# Patient Record
Sex: Female | Born: 1970
Health system: Southern US, Community
[De-identification: ages and names within clinical notes are randomized; demographics above are authoritative.]

## PROBLEM LIST (undated history)

## (undated) DIAGNOSIS — F32A Depression, unspecified: Secondary | ICD-10-CM

## (undated) DIAGNOSIS — F329 Major depressive disorder, single episode, unspecified: Secondary | ICD-10-CM

## (undated) DIAGNOSIS — J329 Chronic sinusitis, unspecified: Secondary | ICD-10-CM

## (undated) DIAGNOSIS — I10 Essential (primary) hypertension: Secondary | ICD-10-CM

## (undated) DIAGNOSIS — K760 Fatty (change of) liver, not elsewhere classified: Secondary | ICD-10-CM

## (undated) HISTORY — DX: Fatty (change of) liver, not elsewhere classified: K76.0

## (undated) HISTORY — DX: Major depressive disorder, single episode, unspecified: F32.9

## (undated) HISTORY — DX: Depression, unspecified: F32.A

## (undated) HISTORY — DX: Essential (primary) hypertension: I10

---

## 1997-01-05 DIAGNOSIS — E1165 Type 2 diabetes mellitus with hyperglycemia: Secondary | ICD-10-CM

## 1997-01-05 DIAGNOSIS — E119 Type 2 diabetes mellitus without complications: Secondary | ICD-10-CM | POA: Insufficient documentation

## 1998-05-27 ENCOUNTER — Encounter: Admission: RE | Admit: 1998-05-27 | Discharge: 1998-05-27 | Payer: Self-pay | Admitting: Internal Medicine

## 1998-06-10 ENCOUNTER — Encounter: Admission: RE | Admit: 1998-06-10 | Discharge: 1998-09-08 | Payer: Self-pay | Admitting: *Deleted

## 1999-04-14 ENCOUNTER — Encounter: Admission: RE | Admit: 1999-04-14 | Discharge: 1999-04-14 | Payer: Self-pay | Admitting: Internal Medicine

## 1999-07-08 ENCOUNTER — Encounter: Admission: RE | Admit: 1999-07-08 | Discharge: 1999-07-08 | Payer: Self-pay | Admitting: Internal Medicine

## 1999-07-18 ENCOUNTER — Encounter: Admission: RE | Admit: 1999-07-18 | Discharge: 1999-10-16 | Payer: Self-pay | Admitting: Internal Medicine

## 1999-08-14 ENCOUNTER — Encounter: Admission: RE | Admit: 1999-08-14 | Discharge: 1999-08-14 | Payer: Self-pay | Admitting: Hematology and Oncology

## 1999-09-11 ENCOUNTER — Encounter: Admission: RE | Admit: 1999-09-11 | Discharge: 1999-09-11 | Payer: Self-pay | Admitting: Hematology and Oncology

## 1999-11-11 ENCOUNTER — Encounter: Admission: RE | Admit: 1999-11-11 | Discharge: 1999-11-11 | Payer: Self-pay | Admitting: Hematology and Oncology

## 1999-12-12 ENCOUNTER — Encounter: Admission: RE | Admit: 1999-12-12 | Discharge: 2000-03-11 | Payer: Self-pay | Admitting: Internal Medicine

## 1999-12-16 ENCOUNTER — Encounter: Admission: RE | Admit: 1999-12-16 | Discharge: 1999-12-16 | Payer: Self-pay | Admitting: Obstetrics & Gynecology

## 1999-12-25 ENCOUNTER — Ambulatory Visit (HOSPITAL_COMMUNITY): Admission: RE | Admit: 1999-12-25 | Discharge: 1999-12-25 | Payer: Self-pay | Admitting: Obstetrics & Gynecology

## 2000-02-12 ENCOUNTER — Encounter: Admission: RE | Admit: 2000-02-12 | Discharge: 2000-02-12 | Payer: Self-pay | Admitting: Hematology and Oncology

## 2000-03-12 ENCOUNTER — Encounter: Admission: RE | Admit: 2000-03-12 | Discharge: 2000-06-10 | Payer: Self-pay | Admitting: Internal Medicine

## 2000-03-23 ENCOUNTER — Encounter: Admission: RE | Admit: 2000-03-23 | Discharge: 2000-03-23 | Payer: Self-pay | Admitting: Obstetrics & Gynecology

## 2000-04-06 ENCOUNTER — Encounter: Admission: RE | Admit: 2000-04-06 | Discharge: 2000-04-06 | Payer: Self-pay | Admitting: Hematology and Oncology

## 2000-04-08 ENCOUNTER — Emergency Department (HOSPITAL_COMMUNITY): Admission: EM | Admit: 2000-04-08 | Discharge: 2000-04-08 | Payer: Self-pay | Admitting: Emergency Medicine

## 2000-04-09 ENCOUNTER — Emergency Department (HOSPITAL_COMMUNITY): Admission: EM | Admit: 2000-04-09 | Discharge: 2000-04-09 | Payer: Self-pay | Admitting: Emergency Medicine

## 2000-07-27 ENCOUNTER — Emergency Department (HOSPITAL_COMMUNITY): Admission: EM | Admit: 2000-07-27 | Discharge: 2000-07-27 | Payer: Self-pay | Admitting: Emergency Medicine

## 2000-07-29 ENCOUNTER — Encounter: Admission: RE | Admit: 2000-07-29 | Discharge: 2000-07-29 | Payer: Self-pay | Admitting: Internal Medicine

## 2000-08-04 ENCOUNTER — Inpatient Hospital Stay (HOSPITAL_COMMUNITY): Admission: AD | Admit: 2000-08-04 | Discharge: 2000-08-06 | Payer: Self-pay | Admitting: Obstetrics

## 2000-08-05 ENCOUNTER — Encounter: Payer: Self-pay | Admitting: Obstetrics

## 2000-08-11 ENCOUNTER — Encounter: Admission: RE | Admit: 2000-08-11 | Discharge: 2000-08-11 | Payer: Self-pay | Admitting: Obstetrics & Gynecology

## 2000-08-11 ENCOUNTER — Inpatient Hospital Stay (HOSPITAL_COMMUNITY): Admission: AD | Admit: 2000-08-11 | Discharge: 2000-08-14 | Payer: Self-pay | Admitting: Obstetrics

## 2000-08-18 ENCOUNTER — Encounter: Admission: RE | Admit: 2000-08-18 | Discharge: 2000-08-18 | Payer: Self-pay | Admitting: Obstetrics & Gynecology

## 2000-08-18 ENCOUNTER — Encounter: Admission: RE | Admit: 2000-08-18 | Discharge: 2000-11-16 | Payer: Self-pay | Admitting: Obstetrics & Gynecology

## 2000-08-25 ENCOUNTER — Encounter: Admission: RE | Admit: 2000-08-25 | Discharge: 2000-08-25 | Payer: Self-pay | Admitting: Obstetrics & Gynecology

## 2000-08-25 ENCOUNTER — Inpatient Hospital Stay (HOSPITAL_COMMUNITY): Admission: AD | Admit: 2000-08-25 | Discharge: 2000-08-30 | Payer: Self-pay | Admitting: Obstetrics

## 2000-08-27 ENCOUNTER — Encounter: Payer: Self-pay | Admitting: Obstetrics

## 2000-09-01 ENCOUNTER — Encounter: Admission: RE | Admit: 2000-09-01 | Discharge: 2000-09-01 | Payer: Self-pay | Admitting: Obstetrics & Gynecology

## 2000-09-08 ENCOUNTER — Encounter: Admission: RE | Admit: 2000-09-08 | Discharge: 2000-09-08 | Payer: Self-pay | Admitting: Obstetrics & Gynecology

## 2000-09-13 ENCOUNTER — Encounter: Admission: RE | Admit: 2000-09-13 | Discharge: 2000-09-13 | Payer: Self-pay | Admitting: Internal Medicine

## 2000-09-22 ENCOUNTER — Encounter: Admission: RE | Admit: 2000-09-22 | Discharge: 2000-09-22 | Payer: Self-pay | Admitting: Obstetrics & Gynecology

## 2000-09-29 ENCOUNTER — Encounter: Admission: RE | Admit: 2000-09-29 | Discharge: 2000-09-29 | Payer: Self-pay | Admitting: Obstetrics & Gynecology

## 2000-10-06 ENCOUNTER — Encounter: Admission: RE | Admit: 2000-10-06 | Discharge: 2000-10-06 | Payer: Self-pay | Admitting: Obstetrics & Gynecology

## 2000-10-12 ENCOUNTER — Ambulatory Visit (HOSPITAL_COMMUNITY): Admission: RE | Admit: 2000-10-12 | Discharge: 2000-10-12 | Payer: Self-pay

## 2000-10-20 ENCOUNTER — Encounter: Admission: RE | Admit: 2000-10-20 | Discharge: 2000-10-20 | Payer: Self-pay | Admitting: Obstetrics & Gynecology

## 2000-11-03 ENCOUNTER — Encounter: Admission: RE | Admit: 2000-11-03 | Discharge: 2000-11-03 | Payer: Self-pay | Admitting: Obstetrics & Gynecology

## 2000-11-16 ENCOUNTER — Ambulatory Visit (HOSPITAL_COMMUNITY): Admission: RE | Admit: 2000-11-16 | Discharge: 2000-11-16 | Payer: Self-pay | Admitting: Obstetrics

## 2000-11-24 ENCOUNTER — Encounter: Admission: RE | Admit: 2000-11-24 | Discharge: 2000-11-24 | Payer: Self-pay | Admitting: Obstetrics & Gynecology

## 2000-12-25 ENCOUNTER — Inpatient Hospital Stay (HOSPITAL_COMMUNITY): Admission: AD | Admit: 2000-12-25 | Discharge: 2000-12-25 | Payer: Self-pay | Admitting: *Deleted

## 2001-01-07 ENCOUNTER — Ambulatory Visit (HOSPITAL_COMMUNITY): Admission: RE | Admit: 2001-01-07 | Discharge: 2001-01-07 | Payer: Self-pay | Admitting: Obstetrics & Gynecology

## 2001-01-12 ENCOUNTER — Encounter: Payer: Self-pay | Admitting: *Deleted

## 2001-01-12 ENCOUNTER — Observation Stay (HOSPITAL_COMMUNITY): Admission: AD | Admit: 2001-01-12 | Discharge: 2001-01-12 | Payer: Self-pay | Admitting: *Deleted

## 2001-01-16 ENCOUNTER — Inpatient Hospital Stay (HOSPITAL_COMMUNITY): Admission: AD | Admit: 2001-01-16 | Discharge: 2001-01-16 | Payer: Self-pay | Admitting: Obstetrics & Gynecology

## 2001-01-19 ENCOUNTER — Encounter: Admission: RE | Admit: 2001-01-19 | Discharge: 2001-02-16 | Payer: Self-pay | Admitting: Obstetrics

## 2001-01-19 ENCOUNTER — Encounter: Admission: RE | Admit: 2001-01-19 | Discharge: 2001-01-19 | Payer: Self-pay | Admitting: Obstetrics & Gynecology

## 2001-01-26 ENCOUNTER — Encounter: Admission: RE | Admit: 2001-01-26 | Discharge: 2001-01-26 | Payer: Self-pay | Admitting: Obstetrics & Gynecology

## 2001-01-31 ENCOUNTER — Inpatient Hospital Stay (HOSPITAL_COMMUNITY): Admission: AD | Admit: 2001-01-31 | Discharge: 2001-02-03 | Payer: Self-pay | Admitting: Obstetrics

## 2001-02-09 ENCOUNTER — Encounter: Admission: RE | Admit: 2001-02-09 | Discharge: 2001-02-09 | Payer: Self-pay | Admitting: *Deleted

## 2001-02-09 ENCOUNTER — Inpatient Hospital Stay (HOSPITAL_COMMUNITY): Admission: AD | Admit: 2001-02-09 | Discharge: 2001-02-11 | Payer: Self-pay | Admitting: *Deleted

## 2001-02-11 ENCOUNTER — Encounter: Payer: Self-pay | Admitting: *Deleted

## 2001-02-15 ENCOUNTER — Inpatient Hospital Stay (HOSPITAL_COMMUNITY): Admission: AD | Admit: 2001-02-15 | Discharge: 2001-02-19 | Payer: Self-pay | Admitting: *Deleted

## 2001-02-15 ENCOUNTER — Encounter (INDEPENDENT_AMBULATORY_CARE_PROVIDER_SITE_OTHER): Payer: Self-pay | Admitting: Specialist

## 2001-03-08 ENCOUNTER — Encounter: Admission: RE | Admit: 2001-03-08 | Discharge: 2001-03-08 | Payer: Self-pay | Admitting: Internal Medicine

## 2001-04-11 ENCOUNTER — Encounter: Admission: RE | Admit: 2001-04-11 | Discharge: 2001-04-11 | Payer: Self-pay | Admitting: Internal Medicine

## 2001-05-23 ENCOUNTER — Encounter: Admission: RE | Admit: 2001-05-23 | Discharge: 2001-05-23 | Payer: Self-pay | Admitting: Internal Medicine

## 2003-07-10 ENCOUNTER — Emergency Department (HOSPITAL_COMMUNITY): Admission: EM | Admit: 2003-07-10 | Discharge: 2003-07-10 | Payer: Self-pay | Admitting: Family Medicine

## 2003-08-29 ENCOUNTER — Encounter: Admission: RE | Admit: 2003-08-29 | Discharge: 2003-08-29 | Payer: Self-pay | Admitting: Internal Medicine

## 2003-09-07 ENCOUNTER — Emergency Department (HOSPITAL_COMMUNITY): Admission: EM | Admit: 2003-09-07 | Discharge: 2003-09-07 | Payer: Self-pay | Admitting: Emergency Medicine

## 2003-09-11 ENCOUNTER — Ambulatory Visit: Payer: Self-pay | Admitting: Family Medicine

## 2003-09-11 ENCOUNTER — Inpatient Hospital Stay (HOSPITAL_COMMUNITY): Admission: AD | Admit: 2003-09-11 | Discharge: 2003-09-17 | Payer: Self-pay | Admitting: *Deleted

## 2003-09-20 ENCOUNTER — Ambulatory Visit: Payer: Self-pay | Admitting: Family Medicine

## 2003-10-04 ENCOUNTER — Ambulatory Visit: Payer: Self-pay | Admitting: Family Medicine

## 2003-10-10 ENCOUNTER — Inpatient Hospital Stay (HOSPITAL_COMMUNITY): Admission: AD | Admit: 2003-10-10 | Discharge: 2003-10-10 | Payer: Self-pay | Admitting: Obstetrics & Gynecology

## 2003-10-11 ENCOUNTER — Ambulatory Visit: Payer: Self-pay | Admitting: Family Medicine

## 2003-10-13 ENCOUNTER — Inpatient Hospital Stay (HOSPITAL_COMMUNITY): Admission: AD | Admit: 2003-10-13 | Discharge: 2003-10-14 | Payer: Self-pay | Admitting: *Deleted

## 2003-10-25 ENCOUNTER — Ambulatory Visit: Payer: Self-pay | Admitting: Family Medicine

## 2003-11-01 ENCOUNTER — Ambulatory Visit: Payer: Self-pay | Admitting: *Deleted

## 2003-11-15 ENCOUNTER — Ambulatory Visit: Payer: Self-pay | Admitting: Family Medicine

## 2003-11-28 ENCOUNTER — Ambulatory Visit: Payer: Self-pay | Admitting: *Deleted

## 2003-12-05 ENCOUNTER — Ambulatory Visit: Payer: Self-pay | Admitting: *Deleted

## 2003-12-06 ENCOUNTER — Emergency Department (HOSPITAL_COMMUNITY): Admission: EM | Admit: 2003-12-06 | Discharge: 2003-12-06 | Payer: Self-pay | Admitting: Family Medicine

## 2003-12-11 ENCOUNTER — Ambulatory Visit (HOSPITAL_COMMUNITY): Admission: RE | Admit: 2003-12-11 | Discharge: 2003-12-11 | Payer: Self-pay | Admitting: *Deleted

## 2003-12-11 ENCOUNTER — Ambulatory Visit: Payer: Self-pay | Admitting: *Deleted

## 2003-12-12 ENCOUNTER — Ambulatory Visit: Payer: Self-pay | Admitting: *Deleted

## 2003-12-26 ENCOUNTER — Ambulatory Visit: Payer: Self-pay | Admitting: Obstetrics & Gynecology

## 2004-01-01 ENCOUNTER — Inpatient Hospital Stay (HOSPITAL_COMMUNITY): Admission: AD | Admit: 2004-01-01 | Discharge: 2004-01-01 | Payer: Self-pay | Admitting: Family Medicine

## 2004-01-02 ENCOUNTER — Ambulatory Visit: Payer: Self-pay | Admitting: Family Medicine

## 2004-01-09 ENCOUNTER — Ambulatory Visit (HOSPITAL_COMMUNITY): Admission: RE | Admit: 2004-01-09 | Discharge: 2004-01-09 | Payer: Self-pay | Admitting: *Deleted

## 2004-01-10 ENCOUNTER — Ambulatory Visit: Payer: Self-pay | Admitting: Family Medicine

## 2004-01-31 ENCOUNTER — Ambulatory Visit: Payer: Self-pay | Admitting: Family Medicine

## 2004-02-07 ENCOUNTER — Ambulatory Visit: Payer: Self-pay | Admitting: Family Medicine

## 2004-02-14 ENCOUNTER — Ambulatory Visit (HOSPITAL_COMMUNITY): Admission: RE | Admit: 2004-02-14 | Discharge: 2004-02-14 | Payer: Self-pay | Admitting: *Deleted

## 2004-02-14 ENCOUNTER — Ambulatory Visit: Payer: Self-pay | Admitting: Family Medicine

## 2004-02-28 ENCOUNTER — Ambulatory Visit: Payer: Self-pay | Admitting: Family Medicine

## 2004-03-06 ENCOUNTER — Ambulatory Visit: Payer: Self-pay | Admitting: Family Medicine

## 2004-03-10 ENCOUNTER — Inpatient Hospital Stay (HOSPITAL_COMMUNITY): Admission: AD | Admit: 2004-03-10 | Discharge: 2004-03-10 | Payer: Self-pay | Admitting: *Deleted

## 2004-03-13 ENCOUNTER — Ambulatory Visit: Payer: Self-pay | Admitting: Family Medicine

## 2004-03-17 ENCOUNTER — Ambulatory Visit: Payer: Self-pay | Admitting: *Deleted

## 2004-03-20 ENCOUNTER — Ambulatory Visit: Payer: Self-pay | Admitting: Family Medicine

## 2004-03-21 ENCOUNTER — Ambulatory Visit: Payer: Self-pay | Admitting: *Deleted

## 2004-03-27 ENCOUNTER — Ambulatory Visit: Payer: Self-pay | Admitting: Family Medicine

## 2004-03-27 ENCOUNTER — Ambulatory Visit (HOSPITAL_COMMUNITY): Admission: RE | Admit: 2004-03-27 | Discharge: 2004-03-27 | Payer: Self-pay | Admitting: Family Medicine

## 2004-03-31 ENCOUNTER — Ambulatory Visit (HOSPITAL_COMMUNITY): Admission: RE | Admit: 2004-03-31 | Discharge: 2004-03-31 | Payer: Self-pay | Admitting: Family Medicine

## 2004-03-31 ENCOUNTER — Ambulatory Visit: Payer: Self-pay | Admitting: Family Medicine

## 2004-04-03 ENCOUNTER — Ambulatory Visit (HOSPITAL_COMMUNITY): Admission: RE | Admit: 2004-04-03 | Discharge: 2004-04-03 | Payer: Self-pay | Admitting: Family Medicine

## 2004-04-03 ENCOUNTER — Ambulatory Visit: Payer: Self-pay | Admitting: Family Medicine

## 2004-04-04 ENCOUNTER — Ambulatory Visit: Payer: Self-pay | Admitting: Obstetrics & Gynecology

## 2004-04-07 ENCOUNTER — Ambulatory Visit: Payer: Self-pay | Admitting: *Deleted

## 2004-04-08 ENCOUNTER — Inpatient Hospital Stay (HOSPITAL_COMMUNITY): Admission: AD | Admit: 2004-04-08 | Discharge: 2004-04-11 | Payer: Self-pay | Admitting: *Deleted

## 2004-04-08 ENCOUNTER — Encounter (INDEPENDENT_AMBULATORY_CARE_PROVIDER_SITE_OTHER): Payer: Self-pay | Admitting: *Deleted

## 2004-04-09 ENCOUNTER — Ambulatory Visit: Payer: Self-pay | Admitting: Obstetrics and Gynecology

## 2004-04-22 ENCOUNTER — Inpatient Hospital Stay (HOSPITAL_COMMUNITY): Admission: AD | Admit: 2004-04-22 | Discharge: 2004-04-22 | Payer: Self-pay | Admitting: *Deleted

## 2004-05-02 ENCOUNTER — Ambulatory Visit: Payer: Self-pay | Admitting: Internal Medicine

## 2004-06-03 ENCOUNTER — Ambulatory Visit: Payer: Self-pay | Admitting: Internal Medicine

## 2004-06-18 ENCOUNTER — Ambulatory Visit: Payer: Self-pay | Admitting: Internal Medicine

## 2004-07-11 ENCOUNTER — Ambulatory Visit: Payer: Self-pay | Admitting: Internal Medicine

## 2004-07-17 ENCOUNTER — Ambulatory Visit (HOSPITAL_COMMUNITY): Admission: RE | Admit: 2004-07-17 | Discharge: 2004-07-17 | Payer: Self-pay | Admitting: Internal Medicine

## 2004-07-21 ENCOUNTER — Ambulatory Visit: Payer: Self-pay | Admitting: Internal Medicine

## 2004-07-29 ENCOUNTER — Ambulatory Visit: Payer: Self-pay | Admitting: Internal Medicine

## 2004-08-01 ENCOUNTER — Ambulatory Visit: Payer: Self-pay | Admitting: Internal Medicine

## 2004-09-16 ENCOUNTER — Ambulatory Visit: Payer: Self-pay | Admitting: Internal Medicine

## 2004-10-10 ENCOUNTER — Emergency Department (HOSPITAL_COMMUNITY): Admission: EM | Admit: 2004-10-10 | Discharge: 2004-10-10 | Payer: Self-pay | Admitting: Family Medicine

## 2005-02-09 ENCOUNTER — Emergency Department (HOSPITAL_COMMUNITY): Admission: EM | Admit: 2005-02-09 | Discharge: 2005-02-09 | Payer: Self-pay | Admitting: Family Medicine

## 2005-02-12 ENCOUNTER — Ambulatory Visit: Payer: Self-pay | Admitting: Internal Medicine

## 2005-04-14 ENCOUNTER — Ambulatory Visit: Payer: Self-pay | Admitting: Hospitalist

## 2005-05-28 ENCOUNTER — Emergency Department (HOSPITAL_COMMUNITY): Admission: EM | Admit: 2005-05-28 | Discharge: 2005-05-28 | Payer: Self-pay | Admitting: Family Medicine

## 2005-06-10 ENCOUNTER — Ambulatory Visit: Payer: Self-pay | Admitting: Internal Medicine

## 2005-06-15 ENCOUNTER — Ambulatory Visit: Payer: Self-pay | Admitting: Internal Medicine

## 2005-07-13 ENCOUNTER — Ambulatory Visit: Payer: Self-pay | Admitting: Hospitalist

## 2005-07-27 ENCOUNTER — Ambulatory Visit: Payer: Self-pay | Admitting: Hospitalist

## 2005-09-09 ENCOUNTER — Ambulatory Visit: Payer: Self-pay | Admitting: Internal Medicine

## 2005-11-02 ENCOUNTER — Encounter (INDEPENDENT_AMBULATORY_CARE_PROVIDER_SITE_OTHER): Payer: Self-pay | Admitting: Internal Medicine

## 2005-11-02 ENCOUNTER — Ambulatory Visit: Payer: Self-pay | Admitting: Internal Medicine

## 2005-11-02 LAB — CONVERTED CEMR LAB
Calcium: 9.1 mg/dL (ref 8.4–10.5)
Glucose, Bld: 172 mg/dL — ABNORMAL HIGH (ref 70–99)
Sodium: 138 meq/L (ref 135–145)

## 2005-12-10 ENCOUNTER — Ambulatory Visit: Payer: Self-pay | Admitting: Internal Medicine

## 2005-12-10 ENCOUNTER — Encounter (INDEPENDENT_AMBULATORY_CARE_PROVIDER_SITE_OTHER): Payer: Self-pay | Admitting: Internal Medicine

## 2005-12-23 DIAGNOSIS — I10 Essential (primary) hypertension: Secondary | ICD-10-CM

## 2005-12-31 ENCOUNTER — Emergency Department (HOSPITAL_COMMUNITY): Admission: EM | Admit: 2005-12-31 | Discharge: 2005-12-31 | Payer: Self-pay | Admitting: Family Medicine

## 2006-02-12 ENCOUNTER — Emergency Department (HOSPITAL_COMMUNITY): Admission: EM | Admit: 2006-02-12 | Discharge: 2006-02-12 | Payer: Self-pay | Admitting: Family Medicine

## 2006-03-04 ENCOUNTER — Telehealth: Payer: Self-pay | Admitting: *Deleted

## 2006-03-11 ENCOUNTER — Encounter (INDEPENDENT_AMBULATORY_CARE_PROVIDER_SITE_OTHER): Payer: Self-pay | Admitting: Internal Medicine

## 2006-03-11 ENCOUNTER — Ambulatory Visit: Payer: Self-pay | Admitting: Hospitalist

## 2006-03-11 LAB — CONVERTED CEMR LAB: Blood Glucose, Fingerstick: 343

## 2006-03-17 LAB — CONVERTED CEMR LAB
AST: 79 units/L — ABNORMAL HIGH (ref 0–37)
BUN: 9 mg/dL (ref 6–23)
Calcium: 9.2 mg/dL (ref 8.4–10.5)
Chloride: 102 meq/L (ref 96–112)
Creatinine, Ser: 0.71 mg/dL (ref 0.40–1.20)
Creatinine, Urine: 117.3 mg/dL

## 2006-03-24 ENCOUNTER — Emergency Department (HOSPITAL_COMMUNITY): Admission: EM | Admit: 2006-03-24 | Discharge: 2006-03-24 | Payer: Self-pay | Admitting: Family Medicine

## 2006-06-17 ENCOUNTER — Ambulatory Visit: Payer: Self-pay | Admitting: Internal Medicine

## 2006-06-17 ENCOUNTER — Encounter (INDEPENDENT_AMBULATORY_CARE_PROVIDER_SITE_OTHER): Payer: Self-pay | Admitting: Internal Medicine

## 2006-06-18 LAB — CONVERTED CEMR LAB
AST: 39 units/L — ABNORMAL HIGH (ref 0–37)
Alkaline Phosphatase: 113 units/L (ref 39–117)
BUN: 11 mg/dL (ref 6–23)
Creatinine, Ser: 0.62 mg/dL (ref 0.40–1.20)
Glucose, Bld: 137 mg/dL — ABNORMAL HIGH (ref 70–99)
Potassium: 3.5 meq/L (ref 3.5–5.3)
Total Bilirubin: 0.4 mg/dL (ref 0.3–1.2)

## 2006-07-16 ENCOUNTER — Emergency Department (HOSPITAL_COMMUNITY): Admission: EM | Admit: 2006-07-16 | Discharge: 2006-07-16 | Payer: Self-pay | Admitting: Family Medicine

## 2006-07-22 ENCOUNTER — Emergency Department (HOSPITAL_COMMUNITY): Admission: EM | Admit: 2006-07-22 | Discharge: 2006-07-22 | Payer: Self-pay | Admitting: Emergency Medicine

## 2006-07-29 ENCOUNTER — Telehealth (INDEPENDENT_AMBULATORY_CARE_PROVIDER_SITE_OTHER): Payer: Self-pay | Admitting: Pharmacy Technician

## 2006-08-04 ENCOUNTER — Emergency Department (HOSPITAL_COMMUNITY): Admission: EM | Admit: 2006-08-04 | Discharge: 2006-08-04 | Payer: Self-pay | Admitting: Family Medicine

## 2006-09-10 ENCOUNTER — Encounter (INDEPENDENT_AMBULATORY_CARE_PROVIDER_SITE_OTHER): Payer: Self-pay | Admitting: Internal Medicine

## 2006-09-10 ENCOUNTER — Ambulatory Visit: Payer: Self-pay | Admitting: Hospitalist

## 2006-09-10 LAB — CONVERTED CEMR LAB
ALT: 197 units/L — ABNORMAL HIGH (ref 0–35)
AST: 130 units/L — ABNORMAL HIGH (ref 0–37)
Albumin: 4.5 g/dL (ref 3.5–5.2)
Alkaline Phosphatase: 140 units/L — ABNORMAL HIGH (ref 39–117)
BUN: 12 mg/dL (ref 6–23)
CO2: 25 meq/L (ref 19–32)
Calcium: 9.4 mg/dL (ref 8.4–10.5)
Chloride: 101 meq/L (ref 96–112)
Creatinine, Ser: 0.61 mg/dL (ref 0.40–1.20)
Glucose, Bld: 119 mg/dL — ABNORMAL HIGH (ref 70–99)
Potassium: 3.6 meq/L (ref 3.5–5.3)
Sodium: 139 meq/L (ref 135–145)
Total Bilirubin: 0.4 mg/dL (ref 0.3–1.2)
Total Protein: 7.9 g/dL (ref 6.0–8.3)

## 2006-11-19 ENCOUNTER — Emergency Department (HOSPITAL_COMMUNITY): Admission: EM | Admit: 2006-11-19 | Discharge: 2006-11-19 | Payer: Self-pay | Admitting: Family Medicine

## 2006-11-25 ENCOUNTER — Ambulatory Visit: Payer: Self-pay | Admitting: Internal Medicine

## 2006-11-25 ENCOUNTER — Encounter (INDEPENDENT_AMBULATORY_CARE_PROVIDER_SITE_OTHER): Payer: Self-pay | Admitting: Internal Medicine

## 2006-11-25 LAB — CONVERTED CEMR LAB
Blood Glucose, Fingerstick: 276
Hgb A1c MFr Bld: 8.8 %

## 2006-11-26 ENCOUNTER — Ambulatory Visit: Payer: Self-pay | Admitting: Internal Medicine

## 2006-11-26 ENCOUNTER — Encounter (INDEPENDENT_AMBULATORY_CARE_PROVIDER_SITE_OTHER): Payer: Self-pay | Admitting: Internal Medicine

## 2006-11-27 ENCOUNTER — Encounter (INDEPENDENT_AMBULATORY_CARE_PROVIDER_SITE_OTHER): Payer: Self-pay | Admitting: Internal Medicine

## 2006-11-27 LAB — CONVERTED CEMR LAB
AST: 91 units/L — ABNORMAL HIGH (ref 0–37)
Albumin: 4.4 g/dL (ref 3.5–5.2)
Alkaline Phosphatase: 139 units/L — ABNORMAL HIGH (ref 39–117)
BUN: 16 mg/dL (ref 6–23)
Candida species: NEGATIVE
Creatinine, Ser: 0.63 mg/dL (ref 0.40–1.20)
Glucose, Bld: 265 mg/dL — ABNORMAL HIGH (ref 70–99)
HDL: 45 mg/dL (ref >39)
LDL Cholesterol: 108 mg/dL — ABNORMAL HIGH (ref 0–99)
Potassium: 3.9 meq/L (ref 3.5–5.3)
Total CHOL/HDL Ratio: 4.6
Trichomonal Vaginitis: NEGATIVE
Triglycerides: 258 mg/dL — ABNORMAL HIGH (ref <150)

## 2007-01-03 ENCOUNTER — Encounter (INDEPENDENT_AMBULATORY_CARE_PROVIDER_SITE_OTHER): Payer: Self-pay | Admitting: Internal Medicine

## 2007-01-03 ENCOUNTER — Ambulatory Visit: Payer: Self-pay | Admitting: Internal Medicine

## 2007-01-03 DIAGNOSIS — E785 Hyperlipidemia, unspecified: Secondary | ICD-10-CM

## 2007-01-03 LAB — CONVERTED CEMR LAB
AST: 95 units/L — ABNORMAL HIGH (ref 0–37)
BUN: 17 mg/dL (ref 6–23)
CO2: 23 meq/L (ref 19–32)
Calcium: 10.2 mg/dL (ref 8.4–10.5)
Chloride: 98 meq/L (ref 96–112)
Creatinine, Ser: 0.73 mg/dL (ref 0.40–1.20)
Hep B Core Total Ab: NEGATIVE
Hep B S Ab: NEGATIVE
Hepatitis B Surface Ag: NEGATIVE
TSH: 3.005 microintl units/mL (ref 0.350–5.50)
Total Bilirubin: 0.5 mg/dL (ref 0.3–1.2)

## 2007-02-14 ENCOUNTER — Encounter (INDEPENDENT_AMBULATORY_CARE_PROVIDER_SITE_OTHER): Payer: Self-pay | Admitting: Internal Medicine

## 2007-03-16 ENCOUNTER — Ambulatory Visit: Payer: Self-pay | Admitting: Infectious Diseases

## 2007-03-16 ENCOUNTER — Telehealth (INDEPENDENT_AMBULATORY_CARE_PROVIDER_SITE_OTHER): Payer: Self-pay | Admitting: Internal Medicine

## 2007-03-16 ENCOUNTER — Encounter (INDEPENDENT_AMBULATORY_CARE_PROVIDER_SITE_OTHER): Payer: Self-pay | Admitting: Internal Medicine

## 2007-03-17 LAB — CONVERTED CEMR LAB
ALT: 165 units/L — ABNORMAL HIGH (ref 0–35)
Albumin: 4.2 g/dL (ref 3.5–5.2)
CO2: 24 meq/L (ref 19–32)
Calcium: 8.9 mg/dL (ref 8.4–10.5)
Chloride: 100 meq/L (ref 96–112)
Microalb Creat Ratio: 7.6 mg/g (ref 0.0–30.0)
Potassium: 3.6 meq/L (ref 3.5–5.3)
Sodium: 139 meq/L (ref 135–145)
Total Protein: 7.2 g/dL (ref 6.0–8.3)

## 2007-03-18 ENCOUNTER — Telehealth: Payer: Self-pay | Admitting: *Deleted

## 2007-04-29 ENCOUNTER — Telehealth (INDEPENDENT_AMBULATORY_CARE_PROVIDER_SITE_OTHER): Payer: Self-pay | Admitting: Internal Medicine

## 2007-05-18 ENCOUNTER — Encounter (INDEPENDENT_AMBULATORY_CARE_PROVIDER_SITE_OTHER): Payer: Self-pay | Admitting: Internal Medicine

## 2007-05-31 ENCOUNTER — Telehealth (INDEPENDENT_AMBULATORY_CARE_PROVIDER_SITE_OTHER): Payer: Self-pay | Admitting: Internal Medicine

## 2007-06-13 ENCOUNTER — Ambulatory Visit: Payer: Self-pay | Admitting: Gastroenterology

## 2008-02-15 ENCOUNTER — Encounter: Payer: Self-pay | Admitting: Internal Medicine

## 2008-02-15 ENCOUNTER — Ambulatory Visit: Payer: Self-pay | Admitting: Internal Medicine

## 2008-02-15 LAB — CONVERTED CEMR LAB: Hgb A1c MFr Bld: 8 %

## 2008-02-17 ENCOUNTER — Encounter (INDEPENDENT_AMBULATORY_CARE_PROVIDER_SITE_OTHER): Payer: Self-pay | Admitting: Internal Medicine

## 2008-02-17 ENCOUNTER — Ambulatory Visit: Payer: Self-pay | Admitting: Internal Medicine

## 2008-02-19 LAB — CONVERTED CEMR LAB
ALT: 38 units/L — ABNORMAL HIGH (ref 0–35)
BUN: 12 mg/dL (ref 6–23)
CO2: 20 meq/L (ref 19–32)
Calcium: 8.7 mg/dL (ref 8.4–10.5)
Chloride: 104 meq/L (ref 96–112)
Cholesterol: 154 mg/dL (ref 0–200)
Creatinine, Ser: 0.71 mg/dL (ref 0.40–1.20)
Glucose, Bld: 279 mg/dL — ABNORMAL HIGH (ref 70–99)
Microalb Creat Ratio: 4.4 mg/g (ref 0.0–30.0)
Total CHOL/HDL Ratio: 3.3

## 2008-03-01 ENCOUNTER — Encounter: Payer: Self-pay | Admitting: Internal Medicine

## 2008-03-01 ENCOUNTER — Ambulatory Visit: Payer: Self-pay | Admitting: Internal Medicine

## 2008-03-07 ENCOUNTER — Telehealth: Payer: Self-pay | Admitting: *Deleted

## 2008-03-14 ENCOUNTER — Ambulatory Visit: Payer: Self-pay | Admitting: Internal Medicine

## 2008-03-15 ENCOUNTER — Ambulatory Visit: Payer: Self-pay | Admitting: Obstetrics and Gynecology

## 2008-03-15 ENCOUNTER — Inpatient Hospital Stay (HOSPITAL_COMMUNITY): Admission: AD | Admit: 2008-03-15 | Discharge: 2008-03-15 | Payer: Self-pay | Admitting: Family Medicine

## 2008-03-16 ENCOUNTER — Telehealth: Payer: Self-pay | Admitting: Internal Medicine

## 2008-03-22 ENCOUNTER — Ambulatory Visit: Payer: Self-pay | Admitting: Obstetrics and Gynecology

## 2008-03-22 ENCOUNTER — Inpatient Hospital Stay (HOSPITAL_COMMUNITY): Admission: RE | Admit: 2008-03-22 | Discharge: 2008-03-22 | Payer: Self-pay | Admitting: Family Medicine

## 2008-04-05 ENCOUNTER — Encounter: Payer: Self-pay | Admitting: Obstetrics and Gynecology

## 2008-04-05 ENCOUNTER — Ambulatory Visit: Payer: Self-pay | Admitting: Obstetrics and Gynecology

## 2008-04-05 LAB — CONVERTED CEMR LAB: hCG, Beta Chain, Quant, S: 14.9 milliintl units/mL

## 2008-04-10 ENCOUNTER — Telehealth: Payer: Self-pay | Admitting: *Deleted

## 2008-04-10 ENCOUNTER — Encounter: Payer: Self-pay | Admitting: Internal Medicine

## 2008-04-10 ENCOUNTER — Ambulatory Visit: Payer: Self-pay | Admitting: Internal Medicine

## 2008-04-11 ENCOUNTER — Encounter: Payer: Self-pay | Admitting: Internal Medicine

## 2008-04-11 ENCOUNTER — Inpatient Hospital Stay (HOSPITAL_COMMUNITY): Admission: AD | Admit: 2008-04-11 | Discharge: 2008-04-11 | Payer: Self-pay | Admitting: Obstetrics & Gynecology

## 2008-05-16 ENCOUNTER — Ambulatory Visit: Payer: Self-pay | Admitting: *Deleted

## 2008-05-16 ENCOUNTER — Encounter: Payer: Self-pay | Admitting: Internal Medicine

## 2008-05-16 LAB — CONVERTED CEMR LAB
Blood Glucose, Fingerstick: 332
Hgb A1c MFr Bld: 8.1 %

## 2008-07-03 ENCOUNTER — Ambulatory Visit: Payer: Self-pay | Admitting: Internal Medicine

## 2008-07-03 ENCOUNTER — Encounter: Payer: Self-pay | Admitting: Internal Medicine

## 2008-07-03 LAB — CONVERTED CEMR LAB
BUN: 8 mg/dL (ref 6–23)
Creatinine, Ser: 0.67 mg/dL (ref 0.40–1.20)
Potassium: 3.8 meq/L (ref 3.5–5.3)

## 2008-07-23 ENCOUNTER — Telehealth: Payer: Self-pay | Admitting: Internal Medicine

## 2008-08-24 ENCOUNTER — Inpatient Hospital Stay (HOSPITAL_COMMUNITY): Admission: AD | Admit: 2008-08-24 | Discharge: 2008-09-01 | Payer: Self-pay | Admitting: Obstetrics & Gynecology

## 2008-09-03 ENCOUNTER — Encounter: Admission: RE | Admit: 2008-09-03 | Discharge: 2008-12-02 | Payer: Self-pay | Admitting: Obstetrics & Gynecology

## 2008-09-03 ENCOUNTER — Ambulatory Visit: Payer: Self-pay | Admitting: Obstetrics & Gynecology

## 2008-09-04 ENCOUNTER — Encounter: Payer: Self-pay | Admitting: Obstetrics & Gynecology

## 2008-09-04 ENCOUNTER — Ambulatory Visit: Payer: Self-pay | Admitting: Obstetrics & Gynecology

## 2008-09-04 LAB — CONVERTED CEMR LAB
Hepatitis B Surface Ag: NEGATIVE
Lymphocytes Relative: 35 % (ref 12–46)
Lymphs Abs: 2.8 10*3/uL (ref 0.7–4.0)
MCV: 84.4 fL (ref 78.0–100.0)
Monocytes Relative: 8 % (ref 3–12)
Neutro Abs: 4.3 10*3/uL (ref 1.7–7.7)
Neutrophils Relative %: 53 % (ref 43–77)
RBC: 4.75 M/uL (ref 3.87–5.11)
Rubella: 72.9 intl units/mL — ABNORMAL HIGH
WBC: 8.1 10*3/uL (ref 4.0–10.5)

## 2008-09-06 ENCOUNTER — Ambulatory Visit: Payer: Self-pay | Admitting: Family Medicine

## 2008-09-06 ENCOUNTER — Ambulatory Visit: Payer: Self-pay | Admitting: Internal Medicine

## 2008-09-17 ENCOUNTER — Ambulatory Visit: Payer: Self-pay | Admitting: Obstetrics & Gynecology

## 2008-09-17 ENCOUNTER — Encounter: Payer: Self-pay | Admitting: Family

## 2008-09-24 ENCOUNTER — Ambulatory Visit: Payer: Self-pay | Admitting: Obstetrics & Gynecology

## 2008-09-24 LAB — CONVERTED CEMR LAB
GC Probe Amp, Genital: NEGATIVE
Hgb A1c MFr Bld: 7.5 % — ABNORMAL HIGH (ref 4.6–6.1)

## 2008-10-01 ENCOUNTER — Ambulatory Visit: Payer: Self-pay | Admitting: Obstetrics & Gynecology

## 2008-10-10 ENCOUNTER — Ambulatory Visit (HOSPITAL_COMMUNITY): Admission: RE | Admit: 2008-10-10 | Discharge: 2008-10-10 | Payer: Self-pay | Admitting: Obstetrics & Gynecology

## 2008-10-15 ENCOUNTER — Ambulatory Visit: Payer: Self-pay | Admitting: Family Medicine

## 2008-10-22 ENCOUNTER — Ambulatory Visit: Payer: Self-pay | Admitting: Obstetrics & Gynecology

## 2008-10-26 ENCOUNTER — Inpatient Hospital Stay (HOSPITAL_COMMUNITY): Admission: AD | Admit: 2008-10-26 | Discharge: 2008-10-26 | Payer: Self-pay | Admitting: Obstetrics & Gynecology

## 2008-11-01 ENCOUNTER — Ambulatory Visit: Payer: Self-pay | Admitting: Obstetrics & Gynecology

## 2008-11-07 ENCOUNTER — Ambulatory Visit (HOSPITAL_COMMUNITY): Admission: RE | Admit: 2008-11-07 | Discharge: 2008-11-07 | Payer: Self-pay | Admitting: Obstetrics & Gynecology

## 2008-11-08 ENCOUNTER — Ambulatory Visit: Payer: Self-pay | Admitting: Obstetrics & Gynecology

## 2008-11-12 ENCOUNTER — Ambulatory Visit: Payer: Self-pay | Admitting: Obstetrics & Gynecology

## 2008-11-14 ENCOUNTER — Ambulatory Visit (HOSPITAL_COMMUNITY): Admission: RE | Admit: 2008-11-14 | Discharge: 2008-11-14 | Payer: Self-pay | Admitting: Obstetrics & Gynecology

## 2008-11-19 ENCOUNTER — Ambulatory Visit: Payer: Self-pay | Admitting: Obstetrics & Gynecology

## 2008-11-21 ENCOUNTER — Ambulatory Visit (HOSPITAL_COMMUNITY): Admission: RE | Admit: 2008-11-21 | Discharge: 2008-11-21 | Payer: Self-pay | Admitting: Obstetrics & Gynecology

## 2008-11-26 ENCOUNTER — Encounter: Payer: Self-pay | Admitting: Family

## 2008-11-26 ENCOUNTER — Ambulatory Visit: Payer: Self-pay | Admitting: Obstetrics & Gynecology

## 2008-12-03 ENCOUNTER — Ambulatory Visit: Payer: Self-pay | Admitting: Obstetrics & Gynecology

## 2008-12-10 ENCOUNTER — Ambulatory Visit: Payer: Self-pay | Admitting: Obstetrics & Gynecology

## 2008-12-17 ENCOUNTER — Encounter: Admission: RE | Admit: 2008-12-17 | Discharge: 2009-01-02 | Payer: Self-pay | Admitting: Obstetrics & Gynecology

## 2008-12-17 ENCOUNTER — Other Ambulatory Visit: Payer: Self-pay | Admitting: Obstetrics & Gynecology

## 2008-12-17 ENCOUNTER — Ambulatory Visit: Payer: Self-pay | Admitting: Obstetrics & Gynecology

## 2008-12-24 ENCOUNTER — Ambulatory Visit: Payer: Self-pay | Admitting: Obstetrics and Gynecology

## 2008-12-31 ENCOUNTER — Ambulatory Visit: Payer: Self-pay | Admitting: Obstetrics & Gynecology

## 2009-01-02 ENCOUNTER — Ambulatory Visit (HOSPITAL_COMMUNITY): Admission: RE | Admit: 2009-01-02 | Discharge: 2009-01-02 | Payer: Self-pay | Admitting: Obstetrics & Gynecology

## 2009-01-07 ENCOUNTER — Encounter: Admission: RE | Admit: 2009-01-07 | Discharge: 2009-04-07 | Payer: Self-pay | Admitting: Obstetrics & Gynecology

## 2009-01-07 ENCOUNTER — Ambulatory Visit: Payer: Self-pay | Admitting: Obstetrics & Gynecology

## 2009-01-14 ENCOUNTER — Ambulatory Visit: Payer: Self-pay | Admitting: Obstetrics & Gynecology

## 2009-01-14 ENCOUNTER — Encounter: Payer: Self-pay | Admitting: Family

## 2009-01-14 LAB — CONVERTED CEMR LAB
ALT: 12 units/L (ref 0–35)
AST: 13 units/L (ref 0–37)
Albumin: 3.1 g/dL — ABNORMAL LOW (ref 3.5–5.2)
Alkaline Phosphatase: 86 units/L (ref 39–117)
Calcium: 8.5 mg/dL (ref 8.4–10.5)
Chloride: 105 meq/L (ref 96–112)
Creatinine, Ser: 0.56 mg/dL (ref 0.40–1.20)
Potassium: 3.6 meq/L (ref 3.5–5.3)

## 2009-01-21 ENCOUNTER — Ambulatory Visit: Payer: Self-pay | Admitting: Obstetrics & Gynecology

## 2009-01-28 ENCOUNTER — Encounter: Payer: Self-pay | Admitting: Family

## 2009-01-28 ENCOUNTER — Ambulatory Visit: Payer: Self-pay | Admitting: Obstetrics & Gynecology

## 2009-01-28 LAB — CONVERTED CEMR LAB
HCT: 34 % — ABNORMAL LOW (ref 36.0–46.0)
Hemoglobin: 12.2 g/dL (ref 12.0–15.0)
RBC: 3.9 M/uL (ref 3.87–5.11)
RDW: 13.6 % (ref 11.5–15.5)

## 2009-01-30 ENCOUNTER — Ambulatory Visit (HOSPITAL_COMMUNITY): Admission: RE | Admit: 2009-01-30 | Discharge: 2009-01-30 | Payer: Self-pay | Admitting: Obstetrics & Gynecology

## 2009-02-04 ENCOUNTER — Ambulatory Visit: Payer: Self-pay | Admitting: Family Medicine

## 2009-02-11 ENCOUNTER — Ambulatory Visit: Payer: Self-pay | Admitting: Obstetrics & Gynecology

## 2009-02-25 ENCOUNTER — Ambulatory Visit: Payer: Self-pay | Admitting: Obstetrics and Gynecology

## 2009-02-26 ENCOUNTER — Ambulatory Visit (HOSPITAL_COMMUNITY): Admission: RE | Admit: 2009-02-26 | Discharge: 2009-02-26 | Payer: Self-pay | Admitting: Obstetrics & Gynecology

## 2009-03-04 ENCOUNTER — Ambulatory Visit: Payer: Self-pay | Admitting: Obstetrics & Gynecology

## 2009-03-04 ENCOUNTER — Encounter: Payer: Self-pay | Admitting: Family

## 2009-03-04 LAB — CONVERTED CEMR LAB
ALT: 15 units/L (ref 0–35)
BUN: 8 mg/dL (ref 6–23)
CO2: 17 meq/L — ABNORMAL LOW (ref 19–32)
Calcium: 8.6 mg/dL (ref 8.4–10.5)
Chloride: 107 meq/L (ref 96–112)
Creatinine, Ser: 0.49 mg/dL (ref 0.40–1.20)
Glucose, Bld: 180 mg/dL — ABNORMAL HIGH (ref 70–99)
HCT: 36.5 % (ref 36.0–46.0)
Hemoglobin: 12.6 g/dL (ref 12.0–15.0)
MCV: 88 fL (ref 78.0–100.0)
Platelets: 264 10*3/uL (ref 150–400)
Total Bilirubin: 0.2 mg/dL — ABNORMAL LOW (ref 0.3–1.2)
Uric Acid, Serum: 3.5 mg/dL (ref 2.4–7.0)
WBC: 8.8 10*3/uL (ref 4.0–10.5)

## 2009-03-05 ENCOUNTER — Ambulatory Visit: Payer: Self-pay | Admitting: Obstetrics & Gynecology

## 2009-03-05 ENCOUNTER — Encounter: Payer: Self-pay | Admitting: Family

## 2009-03-05 LAB — CONVERTED CEMR LAB
Collection Interval-CRCL: 24 hr
Creatinine 24 HR UR: 1397 mg/24hr (ref 700–1800)
Creatinine Clearance: 198 mL/min — ABNORMAL HIGH (ref 75–115)
Creatinine, Urine: 87.3 mg/dL

## 2009-03-07 ENCOUNTER — Ambulatory Visit: Payer: Self-pay | Admitting: Obstetrics and Gynecology

## 2009-03-11 ENCOUNTER — Ambulatory Visit (HOSPITAL_COMMUNITY): Admission: RE | Admit: 2009-03-11 | Discharge: 2009-03-11 | Payer: Self-pay | Admitting: Obstetrics & Gynecology

## 2009-03-11 ENCOUNTER — Ambulatory Visit: Payer: Self-pay | Admitting: Obstetrics & Gynecology

## 2009-03-14 ENCOUNTER — Ambulatory Visit: Payer: Self-pay | Admitting: Obstetrics & Gynecology

## 2009-03-18 ENCOUNTER — Ambulatory Visit: Payer: Self-pay | Admitting: Obstetrics & Gynecology

## 2009-03-21 ENCOUNTER — Ambulatory Visit: Payer: Self-pay | Admitting: Obstetrics & Gynecology

## 2009-03-25 ENCOUNTER — Encounter: Payer: Self-pay | Admitting: Family

## 2009-03-25 ENCOUNTER — Ambulatory Visit: Payer: Self-pay | Admitting: Obstetrics & Gynecology

## 2009-03-26 ENCOUNTER — Encounter: Payer: Self-pay | Admitting: Family

## 2009-03-26 ENCOUNTER — Ambulatory Visit (HOSPITAL_COMMUNITY): Admission: RE | Admit: 2009-03-26 | Discharge: 2009-03-26 | Payer: Self-pay | Admitting: Obstetrics and Gynecology

## 2009-03-28 ENCOUNTER — Ambulatory Visit: Payer: Self-pay | Admitting: Obstetrics & Gynecology

## 2009-04-01 ENCOUNTER — Ambulatory Visit: Payer: Self-pay | Admitting: Obstetrics & Gynecology

## 2009-04-04 ENCOUNTER — Ambulatory Visit: Payer: Self-pay | Admitting: Obstetrics & Gynecology

## 2009-04-05 ENCOUNTER — Inpatient Hospital Stay (HOSPITAL_COMMUNITY): Admission: AD | Admit: 2009-04-05 | Discharge: 2009-04-05 | Payer: Self-pay | Admitting: Obstetrics & Gynecology

## 2009-04-08 ENCOUNTER — Ambulatory Visit: Payer: Self-pay | Admitting: Obstetrics & Gynecology

## 2009-04-10 ENCOUNTER — Inpatient Hospital Stay (HOSPITAL_COMMUNITY): Admission: AD | Admit: 2009-04-10 | Discharge: 2009-04-12 | Payer: Self-pay | Admitting: Obstetrics & Gynecology

## 2009-04-10 ENCOUNTER — Ambulatory Visit: Payer: Self-pay | Admitting: Advanced Practice Midwife

## 2009-04-17 ENCOUNTER — Ambulatory Visit: Payer: Self-pay | Admitting: Internal Medicine

## 2009-04-17 LAB — CONVERTED CEMR LAB
Creatinine, Urine: 105.1 mg/dL
Microalb Creat Ratio: 13.1 mg/g (ref 0.0–30.0)
Microalb, Ur: 1.38 mg/dL (ref 0.00–1.89)

## 2009-04-17 LAB — HM DIABETES FOOT EXAM

## 2009-04-29 ENCOUNTER — Encounter: Payer: Self-pay | Admitting: Internal Medicine

## 2009-05-15 ENCOUNTER — Ambulatory Visit: Payer: Self-pay | Admitting: Internal Medicine

## 2009-05-15 ENCOUNTER — Encounter: Payer: Self-pay | Admitting: Internal Medicine

## 2009-05-24 ENCOUNTER — Ambulatory Visit: Payer: Self-pay | Admitting: Obstetrics & Gynecology

## 2009-05-29 ENCOUNTER — Ambulatory Visit: Payer: Self-pay | Admitting: Internal Medicine

## 2009-05-29 LAB — CONVERTED CEMR LAB
ALT: 126 units/L — ABNORMAL HIGH (ref 0–35)
AST: 54 units/L — ABNORMAL HIGH (ref 0–37)
Basophils Relative: 1 % (ref 0–1)
CO2: 24 meq/L (ref 19–32)
Chloride: 98 meq/L (ref 96–112)
Creatinine, Ser: 0.67 mg/dL (ref 0.40–1.20)
Hemoglobin: 13.9 g/dL (ref 12.0–15.0)
Lymphocytes Relative: 40 % (ref 12–46)
Monocytes Absolute: 0.4 10*3/uL (ref 0.1–1.0)
Monocytes Relative: 6 % (ref 3–12)
Neutro Abs: 3 10*3/uL (ref 1.7–7.7)
Neutrophils Relative %: 47 % (ref 43–77)
RBC: 4.62 M/uL (ref 3.87–5.11)
Sodium: 137 meq/L (ref 135–145)
TSH: 1.208 microintl units/mL (ref 0.350–4.5)
Total Bilirubin: 0.4 mg/dL (ref 0.3–1.2)
Total Protein: 7.6 g/dL (ref 6.0–8.3)
WBC: 6.4 10*3/uL (ref 4.0–10.5)

## 2009-05-30 DIAGNOSIS — K760 Fatty (change of) liver, not elsewhere classified: Secondary | ICD-10-CM | POA: Insufficient documentation

## 2009-06-11 ENCOUNTER — Ambulatory Visit: Payer: Self-pay | Admitting: Internal Medicine

## 2009-06-11 LAB — CONVERTED CEMR LAB
Alkaline Phosphatase: 131 units/L — ABNORMAL HIGH (ref 39–117)
CO2: 24 meq/L (ref 19–32)
Creatinine, Ser: 0.69 mg/dL (ref 0.40–1.20)
Glucose, Bld: 457 mg/dL — ABNORMAL HIGH (ref 70–99)
Sodium: 134 meq/L — ABNORMAL LOW (ref 135–145)
Total Bilirubin: 0.5 mg/dL (ref 0.3–1.2)

## 2009-06-12 ENCOUNTER — Telehealth: Payer: Self-pay | Admitting: *Deleted

## 2009-06-12 ENCOUNTER — Encounter: Payer: Self-pay | Admitting: Internal Medicine

## 2009-06-12 LAB — CONVERTED CEMR LAB
HCV Ab: NEGATIVE
Hep A Total Ab: POSITIVE — AB
Hep B Core Total Ab: NEGATIVE
Hep B S Ab: NEGATIVE
Hepatitis B Surface Ag: NEGATIVE

## 2009-06-16 ENCOUNTER — Encounter: Payer: Self-pay | Admitting: Internal Medicine

## 2009-06-20 ENCOUNTER — Ambulatory Visit (HOSPITAL_COMMUNITY): Admission: RE | Admit: 2009-06-20 | Discharge: 2009-06-20 | Payer: Self-pay | Admitting: Internal Medicine

## 2009-07-04 ENCOUNTER — Telehealth (INDEPENDENT_AMBULATORY_CARE_PROVIDER_SITE_OTHER): Payer: Self-pay | Admitting: *Deleted

## 2009-07-15 ENCOUNTER — Ambulatory Visit: Payer: Self-pay | Admitting: Internal Medicine

## 2009-07-15 LAB — CONVERTED CEMR LAB
Albumin ELP: 56.3 % (ref 55.8–66.1)
Alkaline Phosphatase: 133 units/L — ABNORMAL HIGH (ref 39–117)
BUN: 13 mg/dL (ref 6–23)
Beta Globulin: 6.7 % (ref 4.7–7.2)
CO2: 24 meq/L (ref 19–32)
Cholesterol: 159 mg/dL (ref 0–200)
Creatinine, Ser: 0.6 mg/dL (ref 0.40–1.20)
Gamma Globulin: 17.5 % (ref 11.1–18.8)
Glucose, Bld: 192 mg/dL — ABNORMAL HIGH (ref 70–99)
HDL: 46 mg/dL (ref >39)
Total Bilirubin: 0.5 mg/dL (ref 0.3–1.2)
Total CHOL/HDL Ratio: 3.5
Total Protein: 7.8 g/dL (ref 6.0–8.3)
Triglycerides: 256 mg/dL — ABNORMAL HIGH (ref <150)
VLDL: 51 mg/dL — ABNORMAL HIGH (ref 0–40)

## 2009-08-19 ENCOUNTER — Ambulatory Visit: Payer: Self-pay | Admitting: Internal Medicine

## 2009-09-10 ENCOUNTER — Ambulatory Visit: Payer: Self-pay | Admitting: Internal Medicine

## 2009-09-11 LAB — CONVERTED CEMR LAB
ALT: 82 units/L — ABNORMAL HIGH (ref 0–35)
AST: 43 units/L — ABNORMAL HIGH (ref 0–37)
Albumin: 4 g/dL (ref 3.5–5.2)
Alkaline Phosphatase: 107 units/L (ref 39–117)
BUN: 11 mg/dL (ref 6–23)
Potassium: 4.2 meq/L (ref 3.5–5.3)

## 2009-09-16 ENCOUNTER — Telehealth: Payer: Self-pay | Admitting: *Deleted

## 2009-09-19 ENCOUNTER — Encounter: Payer: Self-pay | Admitting: Internal Medicine

## 2009-09-21 ENCOUNTER — Emergency Department (HOSPITAL_COMMUNITY): Admission: EM | Admit: 2009-09-21 | Discharge: 2009-09-21 | Payer: Self-pay | Admitting: Family Medicine

## 2009-09-26 ENCOUNTER — Telehealth: Payer: Self-pay | Admitting: *Deleted

## 2009-12-02 ENCOUNTER — Ambulatory Visit: Payer: Self-pay | Admitting: Internal Medicine

## 2009-12-02 LAB — CONVERTED CEMR LAB: Pap Smear: NEGATIVE

## 2010-01-06 ENCOUNTER — Ambulatory Visit: Payer: Self-pay | Admitting: Internal Medicine

## 2010-01-06 DIAGNOSIS — K219 Gastro-esophageal reflux disease without esophagitis: Secondary | ICD-10-CM | POA: Insufficient documentation

## 2010-01-09 ENCOUNTER — Telehealth (INDEPENDENT_AMBULATORY_CARE_PROVIDER_SITE_OTHER): Payer: Self-pay | Admitting: *Deleted

## 2010-01-09 ENCOUNTER — Ambulatory Visit: Admission: RE | Admit: 2010-01-09 | Discharge: 2010-01-09 | Payer: Self-pay | Source: Home / Self Care

## 2010-01-09 LAB — CONVERTED CEMR LAB

## 2010-01-26 ENCOUNTER — Encounter: Payer: Self-pay | Admitting: *Deleted

## 2010-01-26 ENCOUNTER — Encounter: Payer: Self-pay | Admitting: Internal Medicine

## 2010-01-30 ENCOUNTER — Ambulatory Visit: Admit: 2010-01-30 | Payer: Self-pay

## 2010-02-04 NOTE — Assessment & Plan Note (Signed)
Summary: EST-3 MONTH DM RECHECK/CH   Vital Signs:  Patient profile:   40 year old female Height:      60.5 inches Weight:      163.3 pounds BMI:     31.48 Temp:     98.1 degrees F oral Pulse rate:   89 / minute BP sitting:   122 / 81  (right arm)  Vitals Entered By: Cynda Familia Duncan Dull) (December 02, 2009 2:29 PM) CBG Result 295  Does patient need assistance? Functional Status Self care           Primary Care Provider:  Lars Mage MD   History of Present Illness: Patient is a 40 yo spanish speaking women with PMH as described in emr is here today for a follow up appointment for BP recheck and diabetes.  She did not bring her meter today. The values have been anywhere between 200 and 300's. Last Hba1c was 9.1 in July 2011. Today Hba1c is 8.9  She is sleeping much better now.  Her BP is  122/81 today which is at goal.  She is trying to loose weight and eat healthy, has gained 3 pounds since last visit. I told her the benefits of fresh vegetable juices and green vegetables and she agrees to try using these.  Will check Pap smear today.  C/o itching in her left ear and noted some blood recently. Says that she was told that she makes too much wax.     Problems Prior to Update: 1)  Transient Disorder Initiating/maintaining Sleep  (ICD-307.41) 2)  Other Chronic Nonalcoholic Liver Disease  (ICD-571.8) 3)  Chest Pain, Atypical  (ICD-786.59) 4)  Otitis Media, Acute  (ICD-382.9) 5)  Blurred Vision  (ICD-368.8) 6)  Abortion, Spontaneous, Complete  (ICD-634.92) 7)  Diabetes Mellitus, Type II  (ICD-250.00) 8)  Hypertension  (ICD-401.9) 9)  Hyperlipidemia  (ICD-272.4) 10)  Gastroenteritis  (ICD-558.9) 11)  Health Maintenance Exam  (ICD-V70.0) 12)  Fatty Liver Disease  (ICD-571.8)  Medications Prior to Update: 1)  Metformin Hcl 1000 Mg Tabs (Metformin Hcl) .... Take 1 Tablet Twice A Day, 2)  Lisinopril-Hydrochlorothiazide 20-25 Mg Tabs (Lisinopril-Hydrochlorothiazide)  .... Take 1 Tablet By Mouth Once A Day 3)  Flora-Q  Caps (Probiotic Product) .... Take One Tablet Daily. 4)  Lantus 100 Unit/ml Soln (Insulin Glargine) .... Inject 40 Units Once A Day To Control Blood Sugars.  Current Medications (verified): 1)  Metformin Hcl 1000 Mg Tabs (Metformin Hcl) .... Take 1 Tablet Twice A Day, 2)  Lisinopril-Hydrochlorothiazide 20-25 Mg Tabs (Lisinopril-Hydrochlorothiazide) .... Take 1 Tablet By Mouth Once A Day 3)  Lantus 100 Unit/ml Soln (Insulin Glargine) .... Inject 40 Units Once A Day To Control Blood Sugars.  Allergies (verified): No Known Drug Allergies  Past History:  Past Medical History: Last updated: 12/23/2005 Diabetes mellitus, type II Hypertension Fatty liver disaease, last LFTs in 10/07 all within normal limits.  Social History: Last updated: 04/17/2009 Married. Recently gave birth to a son 04/2009. Denies EtOH, tobacco, and drug use.  Risk Factors: Alcohol Use: 0 (07/15/2009) Exercise: no (06/11/2009)  Risk Factors: Smoking Status: never (08/19/2009)  Social History: Reviewed history from 04/17/2009 and no changes required. Married. Recently gave birth to a son 04/2009. Denies EtOH, tobacco, and drug use.  Review of Systems      See HPI  Physical Exam  Additional Exam:  Gen: AOx3, in no acute distress Eyes: PERRL, EOMI, normalbilateral TM visualized with some blood clots noted in left ear canal. ENT:MMM, No  erythema noted in posterior pharynx Neck: No JVD, No LAP Chest: CTAB with  good respiratory effort CVS: regular rhythmic rate, NO M/R/G, S1 S2 normal Abdo: soft,ND, BS+x4, Non tender and No hepatosplenomegaly EXT: No odema noted Neuro: Non focal, gait is normal Skin: no rashes noted.    Impression & Recommendations:  Problem # 1:  DIABETES MELLITUS, TYPE II (ICD-250.00) Assessment Improved Improved anly a little. CBG's still raging in 200-300. Will go up on lantus by 5 points. Encouraged to lose weight. Follow  up in 1 month with meter.  Her updated medication list for this problem includes:    Metformin Hcl 1000 Mg Tabs (Metformin hcl) .Marland Kitchen... Take 1 tablet twice a day,    Lisinopril-hydrochlorothiazide 20-25 Mg Tabs (Lisinopril-hydrochlorothiazide) .Marland Kitchen... Take 1 tablet by mouth once a day    Lantus 100 Unit/ml Soln (Insulin glargine) ..... Inject 40 units once a day to control blood sugars.  Orders: T-Hgb A1C (in-house) ([id number]) T- Capillary Blood Glucose (45409)  Her updated medication list for this problem includes:    Metformin Hcl 1000 Mg Tabs (Metformin hcl) .Marland Kitchen... Take 1 tablet twice a day,    Lisinopril-hydrochlorothiazide 20-25 Mg Tabs (Lisinopril-hydrochlorothiazide) .Marland Kitchen... Take 1 tablet by mouth once a day    Lantus 100 Unit/ml Soln (Insulin glargine) ..... Inject 40 units once a day to control blood sugars.  Problem # 2:  OTHER CHRONIC NONALCOHOLIC LIVER DISEASE (ICD-571.8) Assessment: Improved No abdominal pain noted recently. Advised to loose weight. Will follow up hepatic panel in 6 months.  Problem # 3:  HYPERTENSION (ICD-401.9) Assessment: Improved  At goal. Her updated medication list for this problem includes:    Lisinopril-hydrochlorothiazide 20-25 Mg Tabs (Lisinopril-hydrochlorothiazide) .Marland Kitchen... Take 1 tablet by mouth once a day  Her updated medication list for this problem includes:    Lisinopril-hydrochlorothiazide 20-25 Mg Tabs (Lisinopril-hydrochlorothiazide) .Marland Kitchen... Take 1 tablet by mouth once a day  Problem # 4:  HYPERLIPIDEMIA (ICD-272.4) Assessment: Unchanged At goal. Labs Reviewed: SGOT: 43 (09/10/2009)   SGPT: 82 (09/10/2009)   HDL:46 (07/15/2009), 46 (02/17/2008)  LDL:62 (07/15/2009), 81 (02/17/2008)  Chol:159 (07/15/2009), 154 (02/17/2008)  Trig:256 (07/15/2009), 137 (02/17/2008)  Problem # 5:  HEALTH MAINTENANCE EXAM (ICD-V70.0) Assessment: Comment Only  Pap smear today.  Problem # 6:  OTITIS MEDIA, ACUTE (ICD-382.9) Assessment: Improved Inquired  about his left ear irritation and she noting some blood. TMnormal on exam and was told to not pick in her ear.  Complete Medication List: 1)  Metformin Hcl 1000 Mg Tabs (Metformin hcl) .... Take 1 tablet twice a day, 2)  Lisinopril-hydrochlorothiazide 20-25 Mg Tabs (Lisinopril-hydrochlorothiazide) .... Take 1 tablet by mouth once a day 3)  Lantus 100 Unit/ml Soln (Insulin glargine) .... Inject 40 units once a day to control blood sugars.  Other Orders: T-PAP [facility]) [phone])  Patient Instructions: 1)  Please schedule a follow-up appointment in 1 month. 2)  Limit your Sodium (Salt). 3)  It is important that you exercise regularly at least 20 minutes 5 times a week. If you develop chest pain, have severe difficulty breathing, or feel very tired , stop exercising immediately and seek medical attention. 4)  You need to lose weight. Consider a lower calorie diet and regular exercise.  5)  Please bring your meter the next time to your appointment.   Orders Added: 1)  T-Hgb A1C (in-house) [[id number]] 2)  T- Capillary Blood Glucose [[id number]] 3)  T-PAP [facility]) [[id number]] 4)  New Patient Level IV [47829]    Prevention &  Chronic Care Immunizations   Influenza vaccine: Not documented   Influenza vaccine deferral: Deferred  (12/02/2009)    Tetanus booster: 07/15/2009: Tdap    Pneumococcal vaccine: Not documented  Other Screening   Pap smear: NEGATIVE FOR INTRAEPITHELIAL LESIONS OR MALIGNANCY.  (04/05/2008)   Pap smear action/deferral: Refused  (08/19/2009)   Smoking status: never  (08/19/2009)  Diabetes Mellitus   HgbA1C: 8.9  (12/02/2009)    Eye exam: No diabetic retinopathy.     (09/19/2009)   Diabetic eye exam action/deferral: Ophthalmology referral  (08/19/2009)   Eye exam due: 09/2010    Foot exam: yes  (04/17/2009)   Foot exam action/deferral: Do today   High risk foot: Not documented   Foot care education: Done  (04/17/2009)    Urine microalbumin/creatinine ratio: 13.1   (04/17/2009)   Urine microalbumin action/deferral: Ordered    Diabetes flowsheet reviewed?: Yes   Progress toward A1C goal: Improved  Lipids   Total Cholesterol: 159  (07/15/2009)   LDL: 62  (07/15/2009)   LDL Direct: Not documented   HDL: 46  (07/15/2009)   Triglycerides: 256  (07/15/2009)   Lipid panel due: 05/06/2009    SGOT (AST): 43  (09/10/2009)   SGPT (ALT): 82  (09/10/2009)   Alkaline phosphatase: 107  (09/10/2009)   Total bilirubin: 0.4  (09/10/2009)    Lipid flowsheet reviewed?: Yes   Progress toward LDL goal: At goal  Hypertension   Last Blood Pressure: 122 / 81  (12/02/2009)   Serum creatinine: 0.63  (09/10/2009)   Serum potassium 4.2  (09/10/2009)    Hypertension flowsheet reviewed?: Yes   Progress toward BP goal: At goal  Self-Management Support :   Personal Goals (by the next clinic visit) :     Personal A1C goal: 7  (04/17/2009)     Personal blood pressure goal: 130/80  (04/17/2009)     Personal LDL goal: 100  (04/17/2009)    Diabetes self-management support: Written self-care plan  (12/02/2009)   Diabetes care plan printed    Hypertension self-management support: Written self-care plan  (12/02/2009)   Hypertension self-care plan printed.    Lipid self-management support: Written self-care plan  (12/02/2009)   Lipid self-care plan printed.   Laboratory Results   Blood Tests   Date/Time Received: ,D Date/Time Reported: Alric Quan  December 02, 2009 2:51 PM    HGBA1C: 8.9%   (Normal Range: Non-Diabetic - 3-6%   Control Diabetic - 6-8%) CBG Random:: 295mg /dL

## 2010-02-04 NOTE — Progress Notes (Signed)
  Phone Note Outgoing Call   Call placed by: Theotis Barrio NT II,  June 12, 2009 2:36 PM Call placed to: Patient Details for Reason: U/S APPT Summary of Call: CALLED AND SPOKE TO PATIENT, GAVE HER THE U/S APPT : [facility] / 6/16/011 @ 8:30AM TO ARRIVE AT 8:15AM / PATIENT UNDERSTOOD THIS INFO.  APPT LETTER MAILED OUT TO THE PATIENT.

## 2010-02-04 NOTE — Assessment & Plan Note (Signed)
Summary: FU/ BP/ SB.   Vital Signs:  Patient profile:   40 year old female Height:      60.5 inches (153.67 cm) Weight:      165.2 pounds (75.09 kg) BMI:     31.85 Temp:     97.0 degrees F (36.11 degrees C) oral Pulse rate:   84 / minute BP supine:   126 / 95 BP sitting:   126 / 95  (right arm)  Vitals Entered By: Stanton Kidney Ditzler RN (May 29, 2009 8:50 AM) Is Patient Diabetic? Yes Did you bring your meter with you today? Yes Pain Assessment Patient in pain? yes     Location: abdomen Intensity: 9 Type: ? Onset of pain  since last visit Nutritional Status BMI of > 30 = obese Nutritional Status Detail appetite good  Have you ever been in a relationship where you felt threatened, hurt or afraid?denies   Does patient need assistance? Functional Status Self care Ambulation Normal Comments FU BP - nausea and diarrhea sl better. Blood in stool x 1 5.234/11.   Primary Care Provider:  Hartley Barefoot MD   History of Present Illness: 39yof with pmh outlined in chart here for follow up visit.  She was last here 3 weeks ago with otitis and was treated with amoxicillin.  Since then she has seen gyn for post partum visit and at that time was found to have high cbg and high blood pressure, so she was refered back here.  She reports that for the past 10 days she is having 5-6 episodes of diarrhea a day, with hard abd crampy pain, also with nausea right after eating but no vomiting.  She feels like she could vomit but never does.  She has no pain at her c-section site and had no pain with pelvic exam last week.  No fevers or chills.  Depression History:      The patient denies a depressed mood most of the day and a diminished interest in her usual daily activities.         Preventive Screening-Counseling & Management  Alcohol-Tobacco     Alcohol drinks/day: 0     Smoking Status: never  Caffeine-Diet-Exercise     Does Patient Exercise: no     Times/week: 3  Current Medications  (verified): 1)  Metformin Hcl 1000 Mg Tabs (Metformin Hcl) .... Take 1 Tablet Twice A Day, 2)  Hydrochlorothiazide 25 Mg Tabs (Hydrochlorothiazide) .... Take One Tablet Daily For Blood Pressure.  Allergies (verified): No Known Drug Allergies  Review of Systems       per hpi  Physical Exam  General:  alert and overweight-appearing.   Head:  normocephalic and atraumatic.   Mouth:  good dentition and pharynx pink and moist.   Lungs:  normal respiratory effort and normal breath sounds.   Heart:  normal rate, regular rhythm, and no murmur.   Abdomen:  bs elevated, soft, non tender, no mass, no specific areas of pain, no pain over c-section scar which is well healed, no CVA tenderness. Pulses:  2+ Extremities:  no edema Neurologic:  alert & oriented X3, cranial nerves II-XII intact, strength normal in all extremities, and gait normal.   Psych:  Oriented X3, memory intact for recent and remote, normally interactive, and good eye contact.     Impression & Recommendations:  Problem # 1:  DIABETES MELLITUS, TYPE II (ICD-250.00) Last A1C is 6.0 last month.  She brings in a meter log which shows that am cbg's  have increased consistently over the past month from about 130's to 200-300's.  She has had several big changes during this time, she is 8 weeks post partum and she has had 10 days of gi symptoms.  I am not sure how cbg's change in post partum period...  she reports they were good during pregnancy, she took metformin throughout, she is breast feeding now and is trying to exercise.  I suspect that some of the deterioration of control is due to the fact that during the pregnancy she was seeing a provider every month to two weeks and was very motivated to keep them controled. Gi symptoms could be causing stress and increasing cbg's. For now will try to work on gastro symptoms and have her continue to check cbg's.  Her updated medication list for this problem includes:    Metformin Hcl 1000 Mg  Tabs (Metformin hcl) .Marland Kitchen... Take 1 tablet twice a day,  Problem # 2:  HYPERTENSION (ICD-401.9) New prescription from ob for hctz for htn.  Will continue.  Her updated medication list for this problem includes:    Hydrochlorothiazide 25 Mg Tabs (Hydrochlorothiazide) .Marland Kitchen... Take one tablet daily for blood pressure.  Problem # 3:  GASTROENTERITIS (ICD-558.9)  Will go a head and check CBC and CMET.  She just finished a course of amox and may have abx associated diarrhea, unfortunatley she has been in the hosptial so is at risk for c-diff. Will ask for a stool sample to try to rule this out. Metformin is not new, so I do not think it is causing these symptoms.  Her updated medication list for this problem includes:    Metformin Hcl 1000 Mg Tabs (Metformin hcl) .Marland Kitchen... Take 1 tablet twice a day,  Orders: T-CMP with estimated GFR ([id number]) T-CBC w/Diff ([id number]) T-TSH [phone]) T-Culture, C-Diff Toxin A/B ([id number])  Complete Medication List: 1)  Metformin Hcl 1000 Mg Tabs (Metformin hcl) .... Take 1 tablet twice a day, 2)  Hydrochlorothiazide 25 Mg Tabs (Hydrochlorothiazide) .... Take one tablet daily for blood pressure. 3)  Flora-q Caps (Probiotic product) .... Take one tablet daily.  Patient Instructions: 1)  You had labwork done today, we will call you if there is anything that needs to be addressed before your next appointment. 2)  You have a new prescription to help with diarrhea.  You should also try to eat yogurt daily. 3)  Please schedule a follow-up appointment in 2 weeks. 4)  Continue to check your blood sugars and bring your log with you to the next appointment. Prescriptions: FLORA-Q  CAPS (PROBIOTIC PRODUCT) Take one tablet daily.  #32 x 1   Entered and Authorized by:   Elby Showers MD   Signed by:   Elby Showers MD on 05/29/2009   Method used:   Print then Give to Patient   RxID:   [id number] HYDROCHLOROTHIAZIDE 25 MG TABS (HYDROCHLOROTHIAZIDE) Take  one tablet daily for blood pressure.  #32 x 3   Entered and Authorized by:   Elby Showers MD   Signed by:   Elby Showers MD on 05/29/2009   Method used:   Print then Give to Patient   RxID:   [id number]  Process Orders Check Orders Results:     [facility]: ABN not required for this insurance Tests Sent for requisitioning (May 30, 2009 10:12 AM):     05/29/2009: [facility] -- T-CBC w/Diff [[id number]] (signed)     05/29/2009: [facility] -- T-TSH [phone] (signed)  05/29/2009: [facility] -- T-Culture, C-Diff Toxin A/B [phone] (signed)    Prevention & Chronic Care Immunizations   Influenza vaccine: Not documented    Tetanus booster: Not documented    Pneumococcal vaccine: Not documented  Other Screening   Pap smear: NEGATIVE FOR INTRAEPITHELIAL LESIONS OR MALIGNANCY.  (04/05/2008)   Smoking status: never  (05/29/2009)  Diabetes Mellitus   HgbA1C: 6.0  (04/17/2009)    Eye exam: Not documented   Diabetic eye exam action/deferral: Ophthalmology referral  (04/17/2009)    Foot exam: Not documented   Foot exam action/deferral: Do today   High risk foot: Not documented   Foot care education: Done  (04/17/2009)    Urine microalbumin/creatinine ratio: 13.1  (04/17/2009)   Urine microalbumin action/deferral: Ordered    Diabetes flowsheet reviewed?: Yes   Progress toward A1C goal: Unchanged  Lipids   Total Cholesterol: 154  (02/17/2008)   LDL: 81  (02/17/2008)   LDL Direct: Not documented   HDL: 46  (02/17/2008)   Triglycerides: 137  (02/17/2008)   Lipid panel due: 05/06/2009    SGOT (AST): 17  (03/04/2009)   SGPT (ALT): 15  (03/04/2009)   Alkaline phosphatase: 94  (03/04/2009)   Total bilirubin: 0.2  (03/04/2009)    Lipid flowsheet reviewed?: Yes   Progress toward LDL goal: Unchanged  Hypertension   Last Blood Pressure: 126 / 95  (05/29/2009)   Serum creatinine: 0.49   (03/04/2009)   Serum potassium 3.9  (03/04/2009)    Hypertension flowsheet reviewed?: Yes   Progress toward BP goal: At goal  Self-Management Support :   Personal Goals (by the next clinic visit) :     Personal A1C goal: 7  (04/17/2009)     Personal blood pressure goal: 130/80  (04/17/2009)     Personal LDL goal: 100  (04/17/2009)    Patient will work on the following items until the next clinic visit to reach self-care goals:     Medications and monitoring: take my medicines every day, check my blood sugar, check my blood pressure, examine my feet every day  (05/29/2009)     Eating: drink diet soda or water instead of juice or soda, eat more vegetables, use fresh or frozen vegetables, eat baked foods instead of fried foods, eat fruit for snacks and desserts, limit or avoid alcohol  (05/29/2009)     Activity: take a 30 minute walk every day, take the stairs instead of the elevator, park at the far end of the parking lot  (05/29/2009)    Diabetes self-management support: Written self-care plan, Education handout, Resources for patients handout  (05/29/2009)   Diabetes care plan printed   Diabetes education handout printed    Hypertension self-management support: Written self-care plan, Education handout, Resources for patients handout  (05/29/2009)   Hypertension self-care plan printed.   Hypertension education handout printed    Lipid self-management support: Written self-care plan, Education handout, Resources for patients handout  (05/29/2009)   Lipid self-care plan printed.   Lipid education handout printed      Resource handout printed.   Appended Document: FU/ BP/ SB.    Clinical Lists Changes  Problems: Added new problem of TRANSAMINASES, SERUM, ELEVATED (ICD-790.4) Assessed DIABETES MELLITUS, TYPE II as comment only - Called to discuss this again with [name].  She reports that she was on lantus until she got pregant, then she went to [facility] and novolog during  pregnancy and then she stopped all insulin once the baby was born.  This explains why the cbg's are creeping up, and this was not made clear at the last appointment.  I will restart the lantus and have called it in to the [facility] (they have it there).  Will start a little lower than she had been on before and titrate up.  Her updated medication list for this problem includes:    Metformin Hcl 1000 Mg Tabs (Metformin hcl) .Marland Kitchen... Take 1 tablet twice a day,    Lantus 100 Unit/ml Soln (Insulin glargine) ..... Inject 15 units once a day to control blood sugars.  Assessed GASTROENTERITIS as comment only - She reports that her diarrhea is better since starting the probiotic yesterday.  Her updated medication list for this problem includes:    Metformin Hcl 1000 Mg Tabs (Metformin hcl) .Marland Kitchen... Take 1 tablet twice a day,    Lantus 100 Unit/ml Soln (Insulin glargine) ..... Inject 15 units once a day to control blood sugars.  Assessed TRANSAMINASES, SERUM, ELEVATED as comment only - I see that she has had elevated LFT's in 2008 and 2009.  At that time this was attributed to fatty liver disease, hepatitis panel was negative, ferritin and ceruloplasmin were normal. I do not think that an autoimune work up was done.  LFT's have been normal for >74yr and this is the frist time they have been elevated.  Does not seem cholystatic as alk phos is not that high.  Have discussed this with Dr. Aundria Rud.  We will repeat CMEt at next appointment as this may have to do with pregancy, will also recheck hepatitis serology at that time.  Consider autoimune work up. Future Orders: T-Comprehensive Metabolic Panel [phone]) ... 06/13/2009 T-Hepatitis B Core Antibody ([id number]) ... 06/13/2009 T-Hepatitis B Surface Antibody [id number]) ... 06/13/2009 T-Hepatitis B Surface Antigen [id number]) ... 06/13/2009 T-Hepatitis C Antibody ([id number]) ... 06/13/2009  Medications: Added new medication of LANTUS 100 UNIT/ML SOLN (INSULIN  GLARGINE) Inject 15 units once a day to control blood sugars. - Signed Rx of LANTUS 100 UNIT/ML SOLN (INSULIN GLARGINE) Inject 15 units once a day to control blood sugars.;  #3 vials x 3;  Signed;  Entered by: Elby Showers MD;  Authorized by: Elby Showers MD;  Method used: Print then Give to Patient Orders: Added new Test order of T-Comprehensive Metabolic Panel [id number]) - Signed Added new Test order of T-Hepatitis B Core Antibody [phone]) - Signed Added new Test order of T-Hepatitis B Surface Antibody [id number]) - Signed Added new Test order of T-Hepatitis B Surface Antigen [id number]) - Signed Added new Test order of T-Hepatitis C Antibody ([id number]) - Signed    Prescriptions: LANTUS 100 UNIT/ML SOLN (INSULIN GLARGINE) Inject 15 units once a day to control blood sugars.  #3 vials x 3   Entered and Authorized by:   Elby Showers MD   Signed by:   Elby Showers MD on 06/04/2009   Method used:   Print then Give to Patient   RxID:   [id number]     Impression & Recommendations:  Problem # 1:  DIABETES MELLITUS, TYPE II (ICD-250.00) Called to discuss this again with [name].  She reports that she was on lantus until she got pregant, then she went to [facility] and novolog during pregnancy and then she stopped all insulin once the baby was born.  This explains why the cbg's are creeping up, and this was not made clear at the last appointment.  I will restart the lantus and have called it in to the [facility] (they have it  there).  Will start a little lower than she had been on before and titrate up.  Her updated medication list for this problem includes:    Metformin Hcl 1000 Mg Tabs (Metformin hcl) .Marland Kitchen... Take 1 tablet twice a day,    Lantus 100 Unit/ml Soln (Insulin glargine) ..... Inject 15 units once a day to control blood sugars.   Problem # 2:  GASTROENTERITIS (ICD-558.9) She reports that her diarrhea is better since starting the probiotic yesterday.  Her  updated medication list for this problem includes:    Metformin Hcl 1000 Mg Tabs (Metformin hcl) .Marland Kitchen... Take 1 tablet twice a day,    Lantus 100 Unit/ml Soln (Insulin glargine) ..... Inject 15 units once a day to control blood sugars.   Problem # 3:  TRANSAMINASES, SERUM, ELEVATED (ICD-790.4) I see that she has had elevated LFT's in 2008 and 2009.  At that time this was attributed to fatty liver disease, hepatitis panel was negative, ferritin and ceruloplasmin were normal. I do not think that an autoimune work up was done.  LFT's have been normal for >74yr and this is the frist time they have been elevated.  Does not seem cholystatic as alk phos is not that high.  Have discussed this with Dr. Aundria Rud.  We will repeat CMEt at next appointment as this may have to do with pregancy, will also recheck hepatitis serology at that time.  Consider autoimune work up. Future Orders: T-Comprehensive Metabolic Panel [id number]) ... 06/13/2009 T-Hepatitis B Core Antibody ([id number]) ... 06/13/2009 T-Hepatitis B Surface Antibody [id number]) ... 06/13/2009 T-Hepatitis B Surface Antigen ([id number]) ... 06/13/2009 T-Hepatitis C Antibody ([id number]) ... 06/13/2009   Complete Medication List: 1)  Metformin Hcl 1000 Mg Tabs (Metformin hcl) .... Take 1 tablet twice a day, 2)  Hydrochlorothiazide 25 Mg Tabs (Hydrochlorothiazide) .... Take one tablet daily for blood pressure. 3)  Flora-q Caps (Probiotic product) .... Take one tablet daily. 4)  Lantus 100 Unit/ml Soln (Insulin glargine) .... Inject 15 units once a day to control blood sugars.

## 2010-02-04 NOTE — Assessment & Plan Note (Signed)
Summary: FU/SB.   Vital Signs:  Patient profile:   40 year old female Height:      60.5 inches (153.67 cm) Weight:      163.9 pounds (74.50 kg) BMI:     31.60 Temp:     98.5 degrees F (36.94 degrees C) oral Pulse rate:   101 / minute BP sitting:   135 / 84  (left arm) Cuff size:   regular  Vitals Entered By: Theotis Barrio NT II (June 11, 2009 10:15 AM) CC: PATIENT SHE IS HERE FOR REGULAR OFFICE VISIT / STATES SHE HAS PRESSURE IN FOREHEAD AREA-NO PAIN Is Patient Diabetic? Yes Did you bring your meter with you today? No Pain Assessment Patient in pain? no      Nutritional Status BMI of > 30 = obese  Have you ever been in a relationship where you felt threatened, hurt or afraid?No   Does patient need assistance? Functional Status Self care Ambulation Normal Comments PRESSURE IN FOREHEAD - NO PAIN   Primary Care Provider:  Hartley Barefoot MD  CC:  PATIENT SHE IS HERE FOR REGULAR OFFICE VISIT / STATES SHE HAS PRESSURE IN FOREHEAD AREA-NO PAIN.  History of Present Illness: [name] is a 40 yo woman with PMH as outlined below.  She is here to follow up on her glycemic control.  She was seen 2 weeks ago by Dr. Clent Ridges.  At the time she was started on lantus 15 units qam.  She reports lowest fasting BG 279 after starting lantus.  Higher throughout the day.    Preventive Screening-Counseling & Management  Alcohol-Tobacco     Alcohol drinks/day: 0     Smoking Status: never  Caffeine-Diet-Exercise     Does Patient Exercise: no     Times/week: 3  Current Medications (verified): 1)  Metformin Hcl 1000 Mg Tabs (Metformin Hcl) .... Take 1 Tablet Twice A Day, 2)  Hydrochlorothiazide 25 Mg Tabs (Hydrochlorothiazide) .... Take One Tablet Daily For Blood Pressure. 3)  Flora-Q  Caps (Probiotic Product) .... Take One Tablet Daily. 4)  Lantus 100 Unit/ml Soln (Insulin Glargine) .... Inject 15 Units Once A Day To Control Blood Sugars.  Allergies (verified): No Known Drug  Allergies  Past History:  Past Medical History: Last updated: 12/23/2005 Diabetes mellitus, type II Hypertension Fatty liver disaease, last LFTs in 10/07 all within normal limits.  Social History: Last updated: 04/17/2009 Married. Recently gave birth to a son 04/2009. Denies EtOH, tobacco, and drug use.  Risk Factors: Smoking Status: never (06/11/2009)  Review of Systems      See HPI  Physical Exam  General:  alert and overweight-appearing.   Eyes:  anciteric Lungs:  normal respiratory effort and no accessory muscle use.   Neurologic:  alert & oriented X3 and gait normal.   Psych:  Oriented X3, memory intact for recent and remote, and normally interactive.     Impression & Recommendations:  Problem # 1:  DIABETES MELLITUS, TYPE II (ICD-250.00)  will have pt titrate lantus up per instructions below Check fasting glucose daily. If fasting CBG > 180 for 3 days in a row, increase lanuts by 4 units. If fasting CBG is between 130 - 180 for 3 days in a row, then increase lantus by 2 units. If fasting CBG is < 130, do not increase any further.   Has not had lipids in EMR for greater than 1 year, however, not fasting today ? if elevated CBGs related to process in liver?  Her  updated medication list for this problem includes:    Metformin Hcl 1000 Mg Tabs (Metformin hcl) .Marland Kitchen... Take 1 tablet twice a day,    Lantus 100 Unit/ml Soln (Insulin glargine) ..... Inject 15 units once a day to control blood sugars.  Labs Reviewed: Creat: 0.67 (05/29/2009)    Reviewed HgBA1c results: 6.0 (04/17/2009)  7.5 (09/24/2008)  Problem # 2:  TRANSAMINASES, SERUM, ELEVATED (ICD-790.4)  will repeat today if elevated will need acute hep panel (no risk factors) and ultrasound will likely need further w/u including autoimmune/infiltrative w/u subsequently  Orders: T-Comprehensive Metabolic Panel ([id number])  Complete Medication List: 1)  Metformin Hcl 1000 Mg Tabs (Metformin hcl) ....  Take 1 tablet twice a day, 2)  Hydrochlorothiazide 25 Mg Tabs (Hydrochlorothiazide) .... Take one tablet daily for blood pressure. 3)  Flora-q Caps (Probiotic product) .... Take one tablet daily. 4)  Lantus 100 Unit/ml Soln (Insulin glargine) .... Inject 15 units once a day to control blood sugars.  Patient Instructions: 1)  Please schedule a follow-up appointment in 1 month. 2)  Si tienes la Nurse, learning disability mas de 180 por 3 dias seguido, augmenta su lantus pot 4 unidades 3)  Entre 130 y 180, 2 unidades 4)  menos de 130, no augmente 5)  Si tienes algun problema antes de su proxima cita, llame a Event organiser. Process Orders Check Orders Results:     [facility]: ABN not required for this insurance Tests Sent for requisitioning (June 11, 2009 10:45 AM):     06/11/2009: [facility] -- T-Comprehensive Metabolic Panel [id number] (signed)    Prevention & Chronic Care Immunizations   Influenza vaccine: Not documented    Tetanus booster: Not documented    Pneumococcal vaccine: Not documented  Other Screening   Pap smear: NEGATIVE FOR INTRAEPITHELIAL LESIONS OR MALIGNANCY.  (04/05/2008)   Smoking status: never  (06/11/2009)  Diabetes Mellitus   HgbA1C: 6.0  (04/17/2009)    Eye exam: Not documented   Diabetic eye exam action/deferral: Ophthalmology referral  (04/17/2009)    Foot exam: Not documented   Foot exam action/deferral: Do today   High risk foot: Not documented   Foot care education: Done  (04/17/2009)    Urine microalbumin/creatinine ratio: 13.1  (04/17/2009)   Urine microalbumin action/deferral: Ordered  Lipids   Total Cholesterol: 154  (02/17/2008)   LDL: 81  (02/17/2008)   LDL Direct: Not documented   HDL: 46  (02/17/2008)   Triglycerides: 137  (02/17/2008)   Lipid panel due: 05/06/2009    SGOT (AST): 54  (05/29/2009)   SGPT (ALT): 126  (05/29/2009) CMP ordered    Alkaline phosphatase: 135  (05/29/2009)   Total bilirubin:  0.4  (05/29/2009)  Hypertension   Last Blood Pressure: 135 / 84  (06/11/2009)   Serum creatinine: 0.67  (05/29/2009)   Serum potassium 3.7  (05/29/2009) CMP ordered   Self-Management Support :   Personal Goals (by the next clinic visit) :     Personal A1C goal: 7  (04/17/2009)     Personal blood pressure goal: 130/80  (04/17/2009)     Personal LDL goal: 100  (04/17/2009)    Patient will work on the following items until the next clinic visit to reach self-care goals:     Medications and monitoring: take my medicines every day, bring all of my medications to every visit, examine my feet every day  (06/11/2009)     Eating: drink diet soda or water instead of juice or soda,  eat more vegetables, use fresh or frozen vegetables, eat foods that are low in salt, eat baked foods instead of fried foods, eat fruit for snacks and desserts, limit or avoid alcohol  (06/11/2009)     Activity: take a 30 minute walk every day, take the stairs instead of the elevator, park at the far end of the parking lot  (05/29/2009)    Diabetes self-management support: Resources for patients handout  (06/11/2009)    Hypertension self-management support: Resources for patients handout  (06/11/2009)    Lipid self-management support: Resources for patients handout  (06/11/2009)        Resource handout printed.

## 2010-02-04 NOTE — Assessment & Plan Note (Signed)
Summary: ACUTE/REGALADO/;PER GAYLE TO CHECK SUGARS SINCE GIVING BIRTH/CH   Vital Signs:  Patient profile:   40 year old female Height:      60.5 inches (153.67 cm) Weight:      171.6 pounds (78.00 kg) BMI:     33.08 Temp:     97.4 degrees F (36.33 degrees C) oral Pulse rate:   85 / minute BP sitting:   132 / 89  (right arm)  Vitals Entered By: Chinita Pester RN (April 17, 2009 9:27 AM)  Serial Vital Signs/Assessments:  Time      Position  BP       Pulse  Resp  Temp     By 10:15 AM            139/95                         Chinita Pester RN  CC: F/u visit on BP/diabetes. c/o blurred vision.  New mother-baby 58 days old. Is Patient Diabetic? Yes Did you bring your meter with you today? No Pain Assessment Patient in pain? no      Nutritional Status BMI of > 30 = obese CBG Result 207  Have you ever been in a relationship where you felt threatened, hurt or afraid?No   Does patient need assistance? Functional Status Self care Ambulation Normal Comments Interpreter w/pt.   Diabetic Foot Exam Last Podiatry Exam Date: 04/17/2009  Foot Inspection Is there a history of a foot ulcer?              No Is there a foot ulcer now?              No Can the patient see the bottom of their feet?          Yes Are the shoes appropriate in style and fit?          Yes Is there swelling or an abnormal foot shape?          No Are the toenails long?                No Are the toenails thick?                No Are the toenails ingrown?              No Is there heavy callous build-up?              No  Diabetic Foot Care Education Patient educated on appropriate care of diabetic feet.  Pulse Check          Right Foot          Left Foot Dorsalis Pedis:        normal            normal    10-g (5.07) Semmes-Weinstein Monofilament Test Performed by: Chinita Pester RN          Right Foot          Left Foot Visual Inspection     normal         normal Test Control      normal         normal Site  1         normal         normal Site 2         normal         normal Site 3  normal         normal Site 4         normal         normal Site 5         normal         normal Site 6         normal         normal Site 7         normal         normal Site 8         normal         normal Site 9         normal         normal   Primary Care Provider:  Hartley Barefoot MD  CC:  F/u visit on BP/diabetes. c/o blurred vision.  New mother-baby 77 days old.Marland Kitchen  History of Present Illness: 40 year old female with PMH outlined in the EMR.  She recently gave birth to a healthy baby boy on 04/10/09. Throughout her pregnancy, she was maintained on metformin 1000mg  by mouth two times a day for her DM2.   She was taken off all of her anti-HTN meds for the duration of her pregnancy.  Currently, she is doing very well.  She reports fasting am sugars of 120-140; one high reading of 170.  She denies any hypo/hyperglycemic episode.  Denies c/p, sob, doe, dizziness, and syncope.    She admits to occasional blurred vision and mild, centralized frontal h/a over the past 2 weeks.  She denies any visual loss, double vision, severe h/a, photophobia, phonophobia, tinnitus, nausea, vomiting, or other neurological complaints.  SHe describes her h/a as a very mild dull ache without any other associated symptoms.  She states she has not seen an eye doctor in about 1 year. She has no other concerns or complaints.  She and her baby are doing very well.  Depression History:      The patient denies a depressed mood most of the day.  The patient denies significant weight loss, significant weight gain, insomnia, fatigue (loss of energy), and feelings of worthlessness (guilt).  The patient denies symptoms of a manic disorder including abnormally & persistently irritable mood and less need for sleep.         Preventive Screening-Counseling & Management  Alcohol-Tobacco     Alcohol drinks/day: 0     Smoking Status:  never  Caffeine-Diet-Exercise     Does Patient Exercise: no  Current Medications (verified): 1)  Metformin Hcl 1000 Mg Tabs (Metformin Hcl) .... Take 1 Tablet Twice A Day,  Allergies (verified): No Known Drug Allergies  Past History:  Past medical, surgical, family and social histories (including risk factors) reviewed, and any changes appropriately noted.  Past Medical History: Reviewed history from 12/23/2005 and no changes required. Diabetes mellitus, type II Hypertension Fatty liver disaease, last LFTs in 10/07 all within normal limits.  Family History: Reviewed history and no changes required.  Social History: Reviewed history and no changes required. Married. Recently gave birth to a son 04/2009. Denies EtOH, tobacco, and drug use.Does Patient Exercise:  no  Review of Systems Eyes:  Complains of blurring; denies discharge, double vision, eye irritation, eye pain, halos, itching, light sensitivity, red eye, vision loss-1 eye, and vision loss-both eyes.  Physical Exam  General:  alert, well-developed, well-nourished, and well-hydrated.   Head:  normocephalic and atraumatic.   Eyes:  vision grossly intact.  PERRLA,  EOMI.  Fundoscopic exam is negative for papilledema or other abnormal findings.  Sclerae and conjunctivae are normal.  Visual fields are intact.   Mouth:  pharynx pink and moist.   Neck:  supple and no masses.   Lungs:  normal respiratory effort and normal breath sounds.   Heart:  normal rate, regular rhythm, no murmur, no gallop, and no rub.   Abdomen:  soft, non-tender, normal bowel sounds, no distention, no masses, no guarding, and no rigidity.   Neurologic:  alert & oriented X3, cranial nerves II-XII intact, strength normal in all extremities, sensation intact to light touch, and gait normal.   Skin:  turgor normal, color normal, and no rashes.   Psych:  Oriented X3, memory intact for recent and remote, normally interactive, good eye contact, not anxious  appearing, and not depressed appearing.     Impression & Recommendations:  Problem # 1:  BLURRED VISION (ICD-368.8) Physical exam is unremarkable; no evidence of papilledema or other abnormalities.  I do not feel her blurred vision is 2/2 to pseudotumor cerebri, HTN, CVA, or other concerning etiology.  Her mild h/a does not seem to be associated with her vision changes.  Will refer her for optho exam for further w/u; she should be seen at optho clinic next month.  Will f/u results.  Orders: Ophthalmology Referral (Ophthalmology)  Problem # 2:  DIABETES MELLITUS, TYPE II (ICD-250.00) Pt is doing very well on Metformin for control of her DM2.  Her A1c is improved today; now at 6.0.  Her breast feeding is likely helping with control of her CBGs by increasing her daily caloric expenditure.  She is also making healthier dietary choices.  I expect she may need to resume insulin as she stops breast feeding.  Discussed this at length with pt and encourage her to continue with healthy diet and exercise to continue with good control of her DM and potentially avoid the need for additional meds upon cessation of breast feeding.  Will check urine microalb/creatinine ratio today and refer to optho for blurred vision and yearly DM eye exam.  Her updated medication list for this problem includes:    Metformin Hcl 1000 Mg Tabs (Metformin hcl) .Marland Kitchen... Take 1 tablet twice a day,  Orders: T- Capillary Blood Glucose (82948) T-Hgb A1C (in-house) (16109UE) T-Urine Microalbumin w/creat. ratio [phone]) Ophthalmology Referral (Ophthalmology)  Labs Reviewed: Creat: 0.49 (03/04/2009)    Reviewed HgBA1c results: 6.0 (04/17/2009)  7.5 (09/24/2008)  Problem # 3:  HYPERTENSION (ICD-401.9)  Pt is doing very well of BP meds.  Will not restart any of her medicines at this time and f/u in 1 month.  Advised her to check her BP at home as she is able.  If she needs to be re-started on an antihypertensive med,will  start with HCTZ first as it is relatively safe in breastfeeding; safety of lisinopril in breast feeding is unknown and use is not recommended while breast feeding.  BP today: 132/89 Prior BP: 126/83 (09/06/2008)  Labs Reviewed: K+: 3.9 (03/04/2009) Creat: : 0.49 (03/04/2009)   Chol: 154 (02/17/2008)   HDL: 46 (02/17/2008)   LDL: 81 (02/17/2008)   TG: 137 (02/17/2008)  Problem # 4:  HYPERLIPIDEMIA (ICD-272.4) Stable.   Labs Reviewed: SGOT: 17 (03/04/2009)   SGPT: 15 (03/04/2009)   HDL:46 (02/17/2008), 45 (11/26/2006)  LDL:81 (02/17/2008), 108 (95/62/1308)  Chol:154 (02/17/2008), 205 (11/26/2006)  Trig:137 (02/17/2008), 258 (11/26/2006)  Complete Medication List: 1)  Metformin Hcl 1000 Mg Tabs (Metformin hcl) .... Take 1  tablet twice a day,  Patient Instructions: 1)  Please schedule a follow-up appointment in 1 month. 2)  Your metformin prescription was sent to [facility] pharmacy on [address]. 3)  Keep checking your blood sugar.  If your blood sugars start to measure more than 150 every morning before you eat, please make an appointment. 4)  Pleasr bring your blood sugar meter with you at your next appointment. 5)  Congratulations on your new son! Prescriptions: METFORMIN HCL 1000 MG TABS (METFORMIN HCL) take 1 tablet twice a day,  #60 x 8   Entered and Authorized by:   Nelda Bucks DO   Signed by:   Nelda Bucks DO on 04/17/2009   Method used:   Electronically to        [facility] [address] (retail)       [phone]       [place], [place]  [address]       Ph: [phone]       Fax: [phone]   RxID:   [id number]   Prevention & Chronic Care Immunizations   Influenza vaccine: Not documented    Tetanus booster: Not documented    Pneumococcal vaccine: Not documented  Other Screening   Pap smear: NEGATIVE FOR INTRAEPITHELIAL LESIONS OR MALIGNANCY.  (04/05/2008)   Smoking status: never  (04/17/2009)  Diabetes Mellitus    HgbA1C: 6.0  (04/17/2009)    Eye exam: Not documented   Diabetic eye exam action/deferral: Ophthalmology referral  (04/17/2009)    Foot exam: Not documented   Foot exam action/deferral: Do today   High risk foot: Not documented   Foot care education: Done  (04/17/2009)    Urine microalbumin/creatinine ratio: 4.4  (02/17/2008)   Urine microalbumin action/deferral: Ordered    Diabetes flowsheet reviewed?: Yes   Progress toward A1C goal: At goal  Lipids   Total Cholesterol: 154  (02/17/2008)   LDL: 81  (02/17/2008)   LDL Direct: Not documented   HDL: 46  (02/17/2008)   Triglycerides: 137  (02/17/2008)   Lipid panel due: 05/06/2009    SGOT (AST): 17  (03/04/2009)   SGPT (ALT): 15  (03/04/2009)   Alkaline phosphatase: 94  (03/04/2009)   Total bilirubin: 0.2  (03/04/2009)    Lipid flowsheet reviewed?: Yes   Progress toward LDL goal: Unchanged  Hypertension   Last Blood Pressure: 132 / 89  (04/17/2009)   Serum creatinine: 0.49  (03/04/2009)   Serum potassium 3.9  (03/04/2009)    Hypertension flowsheet reviewed?: Yes   Progress toward BP goal: Unchanged  Self-Management Support :   Personal Goals (by the next clinic visit) :     Personal A1C goal: 7  (04/17/2009)     Personal blood pressure goal: 130/80  (04/17/2009)     Personal LDL goal: 100  (04/17/2009)    Patient will work on the following items until the next clinic visit to reach self-care goals:     Medications and monitoring: take my medicines every day, bring all of my medications to every visit, examine my feet every day  (04/17/2009)     Eating: eat more vegetables, use fresh or frozen vegetables, eat foods that are low in salt, eat baked foods instead of fried foods  (04/17/2009)     Activity: take a 30 minute walk every day  (04/17/2009)    Diabetes self-management support: Education handout, Resources for patients handout, Written self-care plan  (04/17/2009)   Diabetes care plan  printed   Diabetes  education handout printed    Hypertension self-management support: Education handout, Resources for patients handout, Written self-care plan  (04/17/2009)   Hypertension self-care plan printed.   Hypertension education handout printed    Lipid self-management support: Education handout, Resources for patients handout, Written self-care plan  (04/17/2009)   Lipid self-care plan printed.   Lipid education handout printed      Resource handout printed.   Nursing Instructions: Refer for screening diabetic eye exam (see order) Diabetic foot exam today   Process Orders Check Orders Results:     [facility]: ABN not required for this insurance Tests Sent for requisitioning (April 22, 2009 8:34 AM):     04/17/2009: [facility] -- T-Urine Microalbumin w/creat. ratio [[id number]] (signed)    Laboratory Results   Blood Tests   Date/Time Received: April 17, 2009 9:48 AM Date/Time Reported: Alric Quan  April 17, 2009 9:48 AM   HGBA1C: 6.0%   (Normal Range: Non-Diabetic - 3-6%   Control Diabetic - 6-8%) CBG Random:: 207mg /dL

## 2010-02-04 NOTE — Letter (Signed)
Summary: BLOOD SUGAR REPORT  BLOOD SUGAR REPORT   Imported By: Margie Billet 05/29/2009 11:20:33  _____________________________________________________________________  External Attachment:    Type:   Image     Comment:   External Document

## 2010-02-04 NOTE — Assessment & Plan Note (Signed)
Summary: FU/SB.   Vital Signs:  Patient profile:   40 year old female Height:      60.5 inches Weight:      168.0 pounds BMI:     32.39 Temp:     97.0 degrees F oral Pulse rate:   98 / minute BP sitting:   130 / 91  (right arm)  Vitals Entered By: Filomena Jungling NT II (May 15, 2009 11:33 AM) CC: follow-upvisit Is Patient Diabetic? Yes Did you bring your meter with you today? No Nutritional Status BMI of > 30 = obese  Have you ever been in a relationship where you felt threatened, hurt or afraid?No   Does patient need assistance? Functional Status Self care Ambulation Normal   Primary Care Provider:  Hartley Barefoot MD  CC:  follow-upvisit.  History of Present Illness: 40 year old with Past Medical History: Diabetes mellitus, type II Hypertension Fatty liver disaease, last LFTs in 10/07 all within normal limits.  She is complaining of left ear pain since 1 month ago.  She has been checking blood sugar range 116 - 187.  She  also relates 2 episode of chest pain , last week, lasted couples seconds. Pain worse on palpation, and movement radiating to arm and back. She had pain after she recieved news that her father died. She denies dyspnea.    Preventive Screening-Counseling & Management  Alcohol-Tobacco     Alcohol drinks/day: 0     Smoking Status: never  Caffeine-Diet-Exercise     Does Patient Exercise: no     Times/week: 3  Current Medications (verified): 1)  Metformin Hcl 1000 Mg Tabs (Metformin Hcl) .... Take 1 Tablet Twice A Day, 2)  Amoxicillin 500 Mg Caps (Amoxicillin) .... Take 1 Tablet Every 12 Hour For 5 Days.  Allergies: No Known Drug Allergies  Review of Systems  The patient denies fever, syncope, dyspnea on exertion, peripheral edema, prolonged cough, headaches, hemoptysis, abdominal pain, melena, hematochezia, and severe indigestion/heartburn.    Physical Exam  General:  alert, well-developed, and well-nourished.   Head:  normocephalic and  atraumatic.   Ears:  left  TM erythema.  R TM erythema and L TM bulging.   Lungs:  normal respiratory effort, no intercostal retractions, no accessory muscle use, and normal breath sounds.   Heart:  normal rate and regular rhythm.   Abdomen:  soft, non-tender, normal bowel sounds, and no distention.   Extremities:  No edema.  Neurologic:  alert & oriented X3, cranial nerves II-XII intact, and strength normal in all extremities.     Impression & Recommendations:  Problem # 1:  HYPERTENSION (ICD-401.9) I will monitor BP for now. She is also breast feeding.  BP today: 130/91 Prior BP: 132/89 (04/17/2009)  Labs Reviewed: K+: 3.9 (03/04/2009) Creat: : 0.49 (03/04/2009)   Chol: 154 (02/17/2008)   HDL: 46 (02/17/2008)   LDL: 81 (02/17/2008)   TG: 137 (02/17/2008)  Problem # 2:  BLURRED VISION (ICD-368.8) Improved. Opthalmology referral in process.   Problem # 3:  DIABETES MELLITUS, TYPE II (ICD-250.00) Continue with metformin. Blood sugar range 113 to 187.  Will check HBA1c next visit adjust regimen as needed.  Her updated medication list for this problem includes:    Metformin Hcl 1000 Mg Tabs (Metformin hcl) .Marland Kitchen... Take 1 tablet twice a day,  Problem # 4:  OTITIS EXTERNA (ICD-380.10) I will treat with amoxicillin. I advised patient to stop taking amoxicillin if her baby develop rash, or diarrhea and to inform baby's  pediatrician.   Problem # 5:  CHEST PAIN, ATYPICAL (ICD-786.59) This is likely muscle skeletal vs stress. EKG done in the office showed no ST changes or Q wave. Tylenol for pain as needed.   Complete Medication List: 1)  Metformin Hcl 1000 Mg Tabs (Metformin hcl) .... Take 1 tablet twice a day, 2)  Amoxicillin 500 Mg Caps (Amoxicillin) .... Take 1 tablet every 12 hour for 5 days.  Patient Instructions: 1)  Please schedule a follow-up appointment in 2  month. Prescriptions: AMOXICILLIN 500 MG CAPS (AMOXICILLIN) Take 1 tablet every 12 hour for 5 days.  #10 x 0   Entered  and Authorized by:   Hartley Barefoot MD   Signed by:   Hartley Barefoot MD on 05/15/2009   Method used:   Print then Give to Patient   RxID:   [id number]

## 2010-02-04 NOTE — Progress Notes (Signed)
Summary: phone/gg  Phone Note Call from Patient   Summary of Call: Pt is not able to afford meds at [facility]   [facility] called and pt needs to bring in paper work. Pt called and no answer at [phone].......Marland Kitchenwill try again Merrie Roof RN  September 26, 2009 3:43 PM   again no answer  Merrie Roof RN  September 26, 2009 5:24 PM   No answer again Initial call taken by: Merrie Roof RN,  September 27, 2009 10:24 AM

## 2010-02-04 NOTE — Progress Notes (Signed)
  Phone Note Outgoing Call   Call placed by: Theotis Barrio NT II,  September 16, 2009 9:49 AM Call placed to: Patient Details for Reason: [facility] SEPT 15, 011/ 8:00AM Summary of Call: PATIENT CONTACTED BY PHONE OF THE CHANGE OF APPT DATE TO THE 9-15-011. STILL AT 8:30AM /

## 2010-02-04 NOTE — Consult Note (Signed)
Summary: Robin Klein  Robin Klein   Imported By: Louretta Parma 11/06/2009 15:32:02  _____________________________________________________________________  External Attachment:    Type:   Image     Comment:   External Document  Appended Document: [name]   Diabetic Eye Exam  Procedure date:  09/19/2009  Findings:      No diabetic retinopathy.     Procedures Next Due Date:    Diabetic Eye Exam: 09/2010   Diabetic Eye Exam  Procedure date:  09/19/2009  Findings:      No diabetic retinopathy.     Procedures Next Due Date:    Diabetic Eye Exam: 09/2010

## 2010-02-04 NOTE — Assessment & Plan Note (Signed)
Summary: FU/SB.   Vital Signs:  Patient profile:   40 year old female Height:      60.5 inches (153.67 cm) Weight:      160.1 pounds (72.77 kg) BMI:     30.86 Temp:     98.6 degrees F (37.00 degrees C) oral Pulse rate:   96 / minute BP sitting:   138 / 94  (left arm) Cuff size:   large  Vitals Entered By: Cynda Familia Duncan Dull) (August 19, 2009 1:33 PM) Is Patient Diabetic? Yes Did you bring your meter with you today? No Nutritional Status BMI of 25 - 29 = overweight  Does patient need assistance? Functional Status Self care Ambulation Normal   Primary Care Provider:  Lars Mage MD   History of Present Illness: Patient is a 40 yo spanish speaking women with PMH as described in emr is here today for a follow up appointment for BP recheck and discuss of her results.  She did not bring her meter today. The values have been anywhere between 128 to 258. Last Hba1c was 9.1 in July 2011.   She is sleeping much better now.  Her BP is 138/94 today which is slightly above goal, I will put her on hctz-lisinopril today.  She is trying to loose weight and eat healthy, has lost 2 pounds since last visit. I told her the benefits of fresh vegetable juices and green vegetables and she agrees to try using these.  Preventive Screening-Counseling & Management  Alcohol-Tobacco     Smoking Status: never  Problems Prior to Update: 1)  Transient Disorder Initiating/maintaining Sleep  (ICD-307.41) 2)  Other Chronic Nonalcoholic Liver Disease  (ICD-571.8) 3)  Chest Pain, Atypical  (ICD-786.59) 4)  Otitis Media, Acute  (ICD-382.9) 5)  Blurred Vision  (ICD-368.8) 6)  Abortion, Spontaneous, Complete  (ICD-634.92) 7)  Diabetes Mellitus, Type II  (ICD-250.00) 8)  Hypertension  (ICD-401.9) 9)  Hyperlipidemia  (ICD-272.4) 10)  Gastroenteritis  (ICD-558.9) 11)  Health Maintenance Exam  (ICD-V70.0) 12)  Fatty Liver Disease  (ICD-571.8)  Medications Prior to Update: 1)  Metformin Hcl 1000 Mg  Tabs (Metformin Hcl) .... Take 1 Tablet Twice A Day, 2)  Hydrochlorothiazide 25 Mg Tabs (Hydrochlorothiazide) .... Take One Tablet Daily For Blood Pressure. 3)  Flora-Q  Caps (Probiotic Product) .... Take One Tablet Daily. 4)  Lantus 100 Unit/ml Soln (Insulin Glargine) .... Inject 38 Units Once A Day To Control Blood Sugars.  Current Medications (verified): 1)  Metformin Hcl 1000 Mg Tabs (Metformin Hcl) .... Take 1 Tablet Twice A Day, 2)  Lisinopril-Hydrochlorothiazide 20-25 Mg Tabs (Lisinopril-Hydrochlorothiazide) .... Take 1 Tablet By Mouth Once A Day 3)  Flora-Q  Caps (Probiotic Product) .... Take One Tablet Daily. 4)  Lantus 100 Unit/ml Soln (Insulin Glargine) .... Inject 40 Units Once A Day To Control Blood Sugars.  Allergies (verified): No Known Drug Allergies  Past History:  Past Medical History: Last updated: 12/23/2005 Diabetes mellitus, type II Hypertension Fatty liver disaease, last LFTs in 10/07 all within normal limits.  Social History: Last updated: 04/17/2009 Married. Recently gave birth to a son 04/2009. Denies EtOH, tobacco, and drug use.  Risk Factors: Alcohol Use: 0 (07/15/2009) Exercise: no (06/11/2009)  Risk Factors: Smoking Status: never (08/19/2009)  Review of Systems      See HPI  Physical Exam  Additional Exam:  Gen: AOx3, in no acute distress Eyes: PERRL, EOMI ENT:MMM, No erythema noted in posterior pharynx Neck: No JVD, No LAP Chest: CTAB with  good  respiratory effort CVS: regular rhythmic rate, NO M/R/G, S1 S2 normal Abdo: soft,ND, BS+x4, Non tender and No hepatosplenomegaly EXT: No odema noted Neuro: Non focal, gait is normal Skin: no rashes noted.    Impression & Recommendations:  Problem # 1:  DIABETES MELLITUS, TYPE II (ICD-250.00) Assessment Deteriorated Patients CBG's has been anywhere between 120-260 which is above the goal. I will increase her lantus to 40 units daily up from 38. She hasnt brought her meter today and her last  Hba1c was 9.1. Optha exam is due. BP is elevated today and will add ACE inhibiter. She is trying to loose weight and lost 2 pounds since last visit.   Her updated medication list for this problem includes:    Metformin Hcl 1000 Mg Tabs (Metformin hcl) .Marland Kitchen... Take 1 tablet twice a day,    Lisinopril-hydrochlorothiazide 20-25 Mg Tabs (Lisinopril-hydrochlorothiazide) .Marland Kitchen... Take 1 tablet by mouth once a day    Lantus 100 Unit/ml Soln (Insulin glargine) ..... Inject 40 units once a day to control blood sugars.  Orders: Ophthalmology Referral (Ophthalmology)  Problem # 2:  HYPERTENSION (ICD-401.9) Assessment: Deteriorated I will add lisinopril to the regimen as her BP has been above goal >2 occations. Repeat BP today was also above goal. Will recheck Cmet in 2 weeks after starting lisinopril. Told her about possible side effects of Lisinopril ie cough and angioedema.  Her updated medication list for this problem includes:    Lisinopril-hydrochlorothiazide 20-25 Mg Tabs (Lisinopril-hydrochlorothiazide) .Marland Kitchen... Take 1 tablet by mouth once a day  Future Orders: T-Comprehensive Metabolic Panel ([id number]) ... 09/05/2009  BP today: 138/94 Prior BP: 130/90 (07/15/2009)  Labs Reviewed: K+: 3.6 (07/15/2009) Creat: : 0.60 (07/15/2009)   Chol: 159 (07/15/2009)   HDL: 46 (07/15/2009)   LDL: 62 (07/15/2009)   TG: 256 (07/15/2009)  Problem # 3:  OTHER CHRONIC NONALCOHOLIC LIVER DISEASE (ICD-571.8) Assessment: Unchanged Explained that her liver functions are stable.  Problem # 4:  HEALTH MAINTENANCE EXAM (ICD-V70.0) Assessment: Comment Only Pap smear refused today and says that she will have it doen next time when her kids are not with her.  Complete Medication List: 1)  Metformin Hcl 1000 Mg Tabs (Metformin hcl) .... Take 1 tablet twice a day, 2)  Lisinopril-hydrochlorothiazide 20-25 Mg Tabs (Lisinopril-hydrochlorothiazide) .... Take 1 tablet by mouth once a day 3)  Flora-q Caps (Probiotic  product) .... Take one tablet daily. 4)  Lantus 100 Unit/ml Soln (Insulin glargine) .... Inject 40 units once a day to control blood sugars.  Patient Instructions: 1)  Please come in 2 weeks for lab work on Sep 1st 2011. 2)  Please schedule an appointment in 6 months for regular follow up.  3)  Limit your Sodium (Salt). 4)  It is important that you exercise regularly at least 20 minutes 5 times a week. If you develop chest pain, have severe difficulty breathing, or feel very tired , stop exercising immediately and seek medical attention. 5)  You need to lose weight. Consider a lower calorie diet and regular exercise.  6)  You need to have a Pap Smear to prevent cervical cancer. Prescriptions: METFORMIN HCL 1000 MG TABS (METFORMIN HCL) take 1 tablet twice a day,  #60 x 11   Entered and Authorized by:   Lars Mage MD   Signed by:   Lars Mage MD on 08/19/2009   Method used:   Electronically to        [facility] [address] [place] (retail)       [phone]       [place], [place]  [address]       Ph: [phone]       Fax: [phone]   RxID:   [id number] LANTUS 100 UNIT/ML SOLN (INSULIN GLARGINE) Inject 38 units once a day to control blood sugars.  #1 month supp x 11   Entered and Authorized by:   Lars Mage MD   Signed by:   Lars Mage MD on 08/19/2009   Method used:   Electronically to        [facility] [address] [place] (retail)       [phone]       [place], [place]  [address]       Ph: [phone]       Fax: [phone]   RxID:   [id number] LISINOPRIL-HYDROCHLOROTHIAZIDE 20-25 MG TABS (LISINOPRIL-HYDROCHLOROTHIAZIDE) Take 1 tablet by mouth once a day  #31 x 11   Entered and Authorized by:   Lars Mage MD   Signed by:   Lars Mage MD on 08/19/2009   Method used:   Electronically to        [facility] [address] [place] (retail)       [phone]       [place], [place]   [address]       Ph: [phone]       Fax: [phone]   RxID:   [id number]  Process Orders Check Orders Results:     [facility]: ABN not required for this insurance Tests Sent for requisitioning (August 20, 2009 4:11 AM):     09/05/2009: [facility] -- T-Comprehensive Metabolic Panel [phone] (signed)     Prevention & Chronic Care Immunizations   Influenza vaccine: Not documented   Influenza vaccine deferral: Deferred  (07/15/2009)    Tetanus booster: 07/15/2009: Tdap    Pneumococcal vaccine: Not documented  Other Screening   Pap smear: NEGATIVE FOR INTRAEPITHELIAL LESIONS OR MALIGNANCY.  (04/05/2008)   Pap smear action/deferral: Refused  (08/19/2009)   Smoking status: never  (08/19/2009)  Diabetes Mellitus   HgbA1C: 9.2  (07/15/2009)    Eye exam: Not documented   Diabetic eye exam action/deferral: Ophthalmology referral  (08/19/2009)    Foot exam: yes  (04/17/2009)   Foot exam action/deferral: Do today   High risk foot: Not documented   Foot care education: Done  (04/17/2009)    Urine microalbumin/creatinine ratio: 13.1  (04/17/2009)   Urine microalbumin action/deferral: Ordered    Diabetes flowsheet reviewed?: Yes   Progress toward A1C goal: Unchanged  Lipids   Total Cholesterol: 159  (07/15/2009)   LDL: 62  (07/15/2009)   LDL Direct: Not documented   HDL: 46  (07/15/2009)   Triglycerides: 256  (07/15/2009)   Lipid panel due: 05/06/2009    SGOT (AST): 105  (07/15/2009)   SGPT (ALT): 160  (07/15/2009) CMP ordered    Alkaline phosphatase: 133  (07/15/2009)   Total bilirubin: 0.5  (07/15/2009)    Lipid flowsheet reviewed?: Yes   Progress toward LDL goal: At goal  Hypertension   Last Blood Pressure: 138 / 94  (08/19/2009)   Serum creatinine: 0.60  (07/15/2009)   Serum potassium 3.6  (07/15/2009) CMP ordered     Hypertension flowsheet reviewed?: Yes   Progress toward BP goal: Deteriorated  Self-Management  Support :   Personal Goals (by  the next clinic visit) :     Personal A1C goal: 7  (04/17/2009)     Personal blood pressure goal: 130/80  (04/17/2009)     Personal LDL goal: 100  (04/17/2009)    Patient will work on the following items until the next clinic visit to reach self-care goals:     Medications and monitoring: take my medicines every day  (08/19/2009)     Eating: eat foods that are low in salt, eat baked foods instead of fried foods  (08/19/2009)     Activity: take a 30 minute walk every day, take the stairs instead of the elevator, park at the far end of the parking lot  (07/15/2009)     Other: bring your meter to every visit  (08/19/2009)    Diabetes self-management support: Written self-care plan  (08/19/2009)   Diabetes care plan printed    Hypertension self-management support: Written self-care plan  (08/19/2009)   Hypertension self-care plan printed.    Lipid self-management support: Written self-care plan  (08/19/2009)   Lipid self-care plan printed.   Nursing Instructions: Refer for screening diabetic eye exam (see order)

## 2010-02-04 NOTE — Letter (Signed)
Summary: BLOOD GLUCOSE 06-16-2009-07-15-2009  BLOOD GLUCOSE 06-16-2009-07-15-2009   Imported By: Margie Billet 07/15/2009 11:17:56  _____________________________________________________________________  External Attachment:    Type:   Image     Comment:   External Document

## 2010-02-04 NOTE — Assessment & Plan Note (Signed)
Summary: RA/NEEDS F/U VISIT/CH   Vital Signs:  Patient profile:   40 year old female Height:      60.5 inches (153.67 cm) Weight:      162 pounds (73.64 kg) BMI:     31.23 Temp:     97.9 degrees F oral Pulse rate:   83 / minute BP sitting:   130 / 90  (right arm) Cuff size:   regular  Vitals Entered By: Angelina Ok RN (July 15, 2009 8:29 AM) CC: Depression Is Patient Diabetic? Yes Did you bring your meter with you today? Yes Pain Assessment Patient in pain? yes     Location: forehead Intensity: 8 Type: pressure Onset of pain  Constant Nutritional Status BMI of > 30 = obese  Have you ever been in a relationship where you felt threatened, hurt or afraid?No   Does patient need assistance? Functional Status Self care Ambulation Normal Comments Some Nausea.  Headaches since last week.  Check up.  Needs refills on her meds.  3 days ago vomiting.    Primary Care Provider:  Lars Mage MD  CC:  Depression.  History of Present Illness: Patient is a 40 yo spanish speaking women with PMH as described in emr is here today for a follow up appointmnet.   She brought her meter today and >92% of the readings are above goal wth most reading between 200 and 300. She is 33 units insulin and metformin.   She is complaining of decreased sleep, I advised her to improve her sleep hygiene, include exercise and physical activity in her daily routine and try tylenol PM as needed.  Her BP is 130/90 today which is slightly above goal, I willnot change any meds today and follow up on it, as she tries to lose her weight.    Depression History:      The patient denies a depressed mood most of the day and a diminished interest in her usual daily activities.         Preventive Screening-Counseling & Management  Alcohol-Tobacco     Alcohol drinks/day: 0     Smoking Status: never  Problems Prior to Update: 1)  Other Chronic Nonalcoholic Liver Disease  (ICD-571.8) 2)  Chest Pain,  Atypical  (ICD-786.59) 3)  Otitis Media, Acute  (ICD-382.9) 4)  Blurred Vision  (ICD-368.8) 5)  Abortion, Spontaneous, Complete  (ICD-634.92) 6)  Diabetes Mellitus, Type II  (ICD-250.00) 7)  Hypertension  (ICD-401.9) 8)  Hyperlipidemia  (ICD-272.4) 9)  Gastroenteritis  (ICD-558.9) 10)  Health Maintenance Exam  (ICD-V70.0) 11)  Fatty Liver Disease  (ICD-571.8)  Medications Prior to Update: 1)  Metformin Hcl 1000 Mg Tabs (Metformin Hcl) .... Take 1 Tablet Twice A Day, 2)  Hydrochlorothiazide 25 Mg Tabs (Hydrochlorothiazide) .... Take One Tablet Daily For Blood Pressure. 3)  Flora-Q  Caps (Probiotic Product) .... Take One Tablet Daily. 4)  Lantus 100 Unit/ml Soln (Insulin Glargine) .... Inject 15 Units Once A Day To Control Blood Sugars.  Current Medications (verified): 1)  Metformin Hcl 1000 Mg Tabs (Metformin Hcl) .... Take 1 Tablet Twice A Day, 2)  Hydrochlorothiazide 25 Mg Tabs (Hydrochlorothiazide) .... Take One Tablet Daily For Blood Pressure. 3)  Flora-Q  Caps (Probiotic Product) .... Take One Tablet Daily. 4)  Lantus 100 Unit/ml Soln (Insulin Glargine) .... Inject 38 Units Once A Day To Control Blood Sugars.  Allergies (verified): No Known Drug Allergies  Past History:  Past Medical History: Last updated: 12/23/2005 Diabetes mellitus, type II Hypertension  Fatty liver disaease, last LFTs in 10/07 all within normal limits.  Social History: Last updated: 04/17/2009 Married. Recently gave birth to a son 04/2009. Denies EtOH, tobacco, and drug use.  Risk Factors: Alcohol Use: 0 (07/15/2009) Exercise: no (06/11/2009)  Risk Factors: Smoking Status: never (07/15/2009)  Review of Systems      See HPI  Physical Exam  Additional Exam:  Gen: AOx3, in no acute distress Eyes: PERRL, EOMI ENT:MMM, No erythema noted in posterior pharynx Neck: No JVD, No LAP Chest: CTAB with  good respiratory effort CVS: regular rhythmic rate, NO M/R/G, S1 S2 normal Abdo: soft,ND, BS+x4,  Non tender and No hepatosplenomegaly EXT: No edema noted Neuro: Non focal, gait is normal Skin: very mild rash scattered over b/l upper extremities   Impression & Recommendations:  Problem # 1:  OTHER CHRONIC NONALCOHOLIC LIVER DISEASE (ICD-571.8) Assessment Unchanged  Patient had hepatitis panel negative, US abdomen was s/o fatty liver/steatohepatosis. I will check CK, Serum protien elctrophoresis and check Cmet for autoimmune diseases. If Serum elctrophoresis +, we may follow up with ANA and anti smith antibodies. I counselled about the importance of weight loss, controlling diabetes and eating healthy as most important therapies for control of fatty liver. At the same time it should be noted patient should not advised to lose weight quickly.  Orders: T-CMP with Estimated GFR ([id number]) T-CK Total ([id number]) T-Prot. Elect. (Serum) [phone])  Problem # 2:  DIABETES MELLITUS, TYPE II (ICD-250.00) Assessment: Deteriorated  Deteriorated, will increase lantus by 5 points as all her values are above 180. Eye exam is due and she is waiting for appointment schedule. Foot exam done in April. Her updated medication list for this problem includes:    Metformin Hcl 1000 Mg Tabs (Metformin hcl) .Marland Kitchen... Take 1 tablet twice a day,    Lantus 100 Unit/ml Soln (Insulin glargine) ..... Inject 38 units once a day to control blood sugars.    Labs Reviewed: Creat: 0.69 (06/11/2009)    Reviewed HgBA1c results: 9.2 (07/15/2009)  6.0 (04/17/2009)  Orders: T-Hgb A1C (in-house) ([id number])  Problem # 3:  HYPERTENSION (ICD-401.9) Assessment: Improved No change in meds today. Her updated medication list for this problem includes:    Hydrochlorothiazide 25 Mg Tabs (Hydrochlorothiazide) .Marland Kitchen... Take one tablet daily for blood pressure.  BP today: 130/90 Prior BP: 135/84 (06/11/2009)  Labs Reviewed: K+: 3.6 (06/11/2009) Creat: : 0.69 (06/11/2009)   Chol: 154 (02/17/2008)   HDL: 46  (02/17/2008)   LDL: 81 (02/17/2008)   TG: 137 (02/17/2008)  Problem # 4:  HYPERLIPIDEMIA (ICD-272.4) Assessment: Unchanged  Will check lipid paneltoday.   Labs Reviewed: SGOT: 60 (06/11/2009)   SGPT: 116 (06/11/2009)   HDL:46 (02/17/2008), 45 (11/26/2006)  LDL:81 (02/17/2008), 108 (41/32/4401)  Chol:154 (02/17/2008), 205 (11/26/2006)  Trig:137 (02/17/2008), 258 (11/26/2006)  Orders: T-Lipid Profile ([id number])  Problem # 5:  Preventive Health Care (ICD-V70.0) Assessment: Comment Only Tetanus shot today.  Problem # 6:  TRANSIENT DISORDER INITIATING/MAINTAINING SLEEP (ICD-307.41) Assessment: Comment Only Discussed sleep hygiene,exercise and tylenol PM as needed to help with sleep.  Complete Medication List: 1)  Metformin Hcl 1000 Mg Tabs (Metformin hcl) .... Take 1 tablet twice a day, 2)  Hydrochlorothiazide 25 Mg Tabs (Hydrochlorothiazide) .... Take one tablet daily for blood pressure. 3)  Flora-q Caps (Probiotic product) .... Take one tablet daily. 4)  Lantus 100 Unit/ml Soln (Insulin glargine) .... Inject 38 units once a day to control blood sugars.  Other Orders: Tdap => 58yrs IM ([id number]) Admin 1st Vaccine (25956)  Patient Instructions: 1)  It is important that you exercise regularly at least 20 minutes 5 times a week. If you develop chest pain, have severe difficulty breathing, or feel very tired , stop exercising immediately and seek medical attention. 2)  You need to lose weight. Consider a lower calorie diet and regular exercise.  3)  Check your blood sugars regularly. If your readings are usually above :200 or below 70 you should contact our office. 4)  It is important that your Diabetic A1c level is checked every 3 months. 5)  See your eye doctor yearly to check for diabetic eye damage. 6)  Check your feet each night for sore areas, calluses or signs of infection. 7)  Check your Blood Pressure regularly. If it is above:130/80 you should make an appointment. 8)  Please  schedule a follow-up appointment in 1 month. Prescriptions: LANTUS 100 UNIT/ML SOLN (INSULIN GLARGINE) Inject 38 units once a day to control blood sugars.  #1 month supp x 11   Entered and Authorized by:   Lars Mage MD   Signed by:   Lars Mage MD on 07/16/2009   Method used:   Electronically to        [facility] [address] (retail)       [phone]       [place], [place]  [address]       Ph: [phone]       Fax: [phone]   RxID:   [id number]   Process Orders Check Orders Results:     [facility]: ABN not required for this insurance Tests Sent for requisitioning (July 16, 2009 9:05 AM):     07/15/2009: [facility] -- T-CMP with Estimated GFR [[id number]] (signed)     07/15/2009: [facility] -- T-Lipid Profile [phone] (signed)     07/15/2009: [facility] -- T-CK Total [[id number]] (signed)     07/15/2009: [facility] -- T-Prot. Elect. (Serum) [phone] (signed)     Vital Signs:  Patient profile:   40 year old female Height:      60.5 inches (153.67 cm) Weight:      162 pounds (73.64 kg) BMI:     31.23 Temp:     97.9 degrees F oral Pulse rate:   83 / minute BP sitting:   130 / 90  (right arm) Cuff size:   regular  Vitals Entered By: Angelina Ok RN (July 15, 2009 8:29 AM)  Prevention & Chronic Care Immunizations   Influenza vaccine: Not documented   Influenza vaccine deferral: Deferred  (07/15/2009)    Tetanus booster: 07/15/2009: Tdap    Pneumococcal vaccine: Not documented  Other Screening   Pap smear: NEGATIVE FOR INTRAEPITHELIAL LESIONS OR MALIGNANCY.  (04/05/2008)   Pap smear action/deferral: Deferred  (07/15/2009)   Smoking status: never  (07/15/2009)  Diabetes Mellitus   HgbA1C: 9.2  (07/15/2009)    Eye exam: Not documented   Diabetic eye exam action/deferral: Ophthalmology referral  (04/17/2009)    Foot exam:  yes  (04/17/2009)   Foot exam action/deferral: Do today   High risk foot: Not documented   Foot care education: Done  (04/17/2009)    Urine microalbumin/creatinine ratio: 13.1  (04/17/2009)   Urine microalbumin action/deferral: Ordered    Diabetes flowsheet reviewed?: Yes   Progress toward A1C goal: Deteriorated  Lipids   Total Cholesterol: 154  (02/17/2008)   LDL: 81  (02/17/2008)   LDL Direct:  Not documented   HDL: 46  (02/17/2008)   Triglycerides: 137  (02/17/2008)   Lipid panel due: 05/06/2009    SGOT (AST): 60  (06/11/2009)   SGPT (ALT): 116  (06/11/2009)   Alkaline phosphatase: 131  (06/11/2009)   Total bilirubin: 0.5  (06/11/2009)    Lipid flowsheet reviewed?: Yes   Progress toward LDL goal: Deteriorated  Hypertension   Last Blood Pressure: 130 / 90  (07/15/2009)   Serum creatinine: 0.69  (06/11/2009)   Serum potassium 3.6  (06/11/2009)    Hypertension flowsheet reviewed?: Yes   Progress toward BP goal: Unchanged  Self-Management Support :   Personal Goals (by the next clinic visit) :     Personal A1C goal: 7  (04/17/2009)     Personal blood pressure goal: 130/80  (04/17/2009)     Personal LDL goal: 100  (04/17/2009)    Patient will work on the following items until the next clinic visit to reach self-care goals:     Medications and monitoring: take my medicines every day, check my blood sugar, bring all of my medications to every visit, examine my feet every day  (07/15/2009)     Eating: drink diet soda or water instead of juice or soda, eat more vegetables, use fresh or frozen vegetables, eat foods that are low in salt, eat baked foods instead of fried foods, eat fruit for snacks and desserts, limit or avoid alcohol  (07/15/2009)     Activity: take a 30 minute walk every day, take the stairs instead of the elevator, park at the far end of the parking lot  (07/15/2009)    Diabetes self-management support: Written self-care plan, Education handout, Pre-printed  educational material, Resources for patients handout  (07/15/2009)   Diabetes care plan printed   Diabetes education handout printed    Hypertension self-management support: Written self-care plan, Education handout, Pre-printed educational material, Resources for patients handout  (07/15/2009)   Hypertension self-care plan printed.   Hypertension education handout printed    Lipid self-management support: Written self-care plan, Education handout, Referred for self-management class, Resources for patients handout  (07/15/2009)   Lipid self-care plan printed.   Lipid education handout printed      Resource handout printed.   Nursing Instructions: Give tetanus booster today Diabetic foot exam today    Laboratory Results   Blood Tests   Date/Time Received: July 15, 2009 9:02 AM  Date/Time Reported: Burke Keels  July 15, 2009 9:02 AM   HGBA1C: 9.2%   (Normal Range: Non-Diabetic - 3-6%   Control Diabetic - 6-8%)       Immunizations Administered:  Tetanus Vaccine:    Vaccine Type: Tdap    Site: left deltoid    Mfr: [employer]    Dose: 0.5 ml    Route: IM    Given by: Angelina Ok RN    Exp. Date: 03/29/2011    Lot #: [id number]    VIS given: 11/23/06 version given July 15, 2009.

## 2010-02-04 NOTE — Progress Notes (Signed)
Summary: refill/ hla  Phone Note Refill Request Message from:  Patient on July 04, 2009 9:40 AM  Refills Requested: Medication #1:  LANTUS 100 UNIT/ML SOLN Inject 15 units once a day to control blood sugars..   Dosage confirmed as above?Dosage Confirmed last visit and labs 6/7  Initial call taken by: Marin Roberts RN,  July 04, 2009 9:41 AM    Prescriptions: LANTUS 100 UNIT/ML SOLN (INSULIN GLARGINE) Inject 15 units once a day to control blood sugars.  #3 vials x 6   Entered and Authorized by:   Zoila Shutter MD   Signed by:   Zoila Shutter MD on 07/04/2009   Method used:   Electronically to        [facility] [address] (retail)       [phone]       [place], [place]  [address]       Ph: [phone]       Fax: [phone]   RxID:   [id number]

## 2010-02-06 NOTE — Assessment & Plan Note (Signed)
Summary: DM TEACHING/CH   Allergies: No Known Drug Allergies   Complete Medication List: 1)  Metformin Hcl 1000 Mg Tabs (Metformin hcl) .... Take 1 tablet twice a day, 2)  Lisinopril-hydrochlorothiazide 20-25 Mg Tabs (Lisinopril-hydrochlorothiazide) .... Take 1 tablet by mouth once a day 3)  Lantus 100 Unit/ml Soln (Insulin glargine) .... Inject 40 units once a day to control blood sugars. 4)  Metronidazole 500 Mg Tabs (Metronidazole) .... Take 4 tabs by mouth once and then stop. 5)  Omeprazole 20 Mg Cpdr (Omeprazole) .... Take 1 tablet by mouth once a day in tyhe morning empty stomach 6)  Katheren Puller Presto Pro Meter Devi (Blood glucose monitoring suppl) .... Use to check blood sugar up to 2x daily 7)  [facility] Test Strp (Glucose blood) .... Use to check blood sugar up to 2x daily 8)  Gnp Insulin Syringe 31g X 5/16" 0.5 Ml Misc (Insulin syringe-needle u-100) .... Use to inject insulin once daily 9)  Lancets Misc (Lancets) .... Use to check blood sugar as directed  Other Orders: DSMT(Medicare) Individual, 30 Minutes (G0108)   Orders Added: 1)  DSMT(Medicare) Individual, 30 Minutes [G0108]    Diabetes Self Management Training  PCP: Lars Mage MD Diabetes Type: Type 2 insulin Other persons present: Interpreter Current smoking Status: never  Assessment Work Hours: Not currently working Daily activities: walks with son or husband watches him when she walks, she also dances a few times a week Sources of Support: husband is supportive, they have 3 children Special needs or Barriers: reads in spanish and english  Potential Barriers  Economic/Supplies  Non-English Speaking  Diabetes Medications:  Comments: Patient reprots her blood sugar was 132 this mornign before breakfast. She was happy wiht that. Self monitors one time daily. states she knows she eats too many tortillas- 5 a day. States that she is motiated ot make healthy changes for herself and her family. She is  having a hard time affording lantus at 120.00 a vial- [facility] security number. She says she does not want to go on other [employer] insulins because she was  hungry and had low blood sugars on them.  Long Acting  Insulin Type:Lantus  Bedtime Dose: 40 units    Monitoring Self monitoring blood glucose 1 time a day Name of Meter  presto wavesense  Time of Testing  Before Breakfast     Estimated /Usual Carb Intake Breakfast # of Carbs/Grams salad with ranch, eggs, tortillas x 3 Lunch # of Carbs/Grams sandwich, fruit Dinner # of Carbs/Grams beans, tortillas x 3, rice, vegetables, eats lot's of fish, shrimp. lobster  Nutrition assessment ETOH : No Do you read food labels?                                                                          No What do you look at?  started education about readings lbels for carbohydrates today Biggest challenge to eating healthy: Eating too much  Activity Limitations  Inadequate physical activity  Type of physical activity  Housework  Other  dances for 20 minutes about one time a week  Walk Length of time: # of times per week: 3-4 Diabetes Disease Process  Discussed today Define diabetes in simple terms: Needs review/assistanceState own type of diabetes: Needs review/assistanceState diabetes is treated by meal plan-exercise-medication-monitoring-education: Demonstrates competency Medications State name-action-dose-duration-side effects-and time to take medication: Needs review/assistance   State appropriate timing of food related to medication: Demonstrates competency Nutritional Management Identify what foods most often affect blood glucose: Needs review/assistance    Verbalize importance of controlling food portions: Needs review/assistance   State changes planned for  home meals/snacks: Needs review/assistance    Monitoring State purpose and frequency of monitoring BG-ketones-HgbA1C  : Demonstrates competencyState target blood glucose and HgbA1C goals: Needs review/assistance Complications State the causes-signs and symptoms and prevention of Hyperglycemia: Needs review/assistanceExplain proper treatment of hyperglycemia: Needs review/assistance Exercise States importance of exercise: Needs review/assistance   States effect of exercise on blood glucose: Needs review/assistance    Lifestyle changes:Goal setting and Problem solving State benefits of making appropriate lifestyle changes: Demonstrates competencyIdentify lifestyle behaviors that need to change: Needs review/assistance   Identify risk factors that interfere with health: Needs review/assistance   Develop strategies to reduce risk factors: Needs review/assistance   Diabetes Management Education Done: 01/09/2010    BEHAVIORAL GOALS INITIAL Incorporating physical activity into lifestyle: add one more day to exercise/walking4- 5 days a week Incorporating appropriate nutritional management: limit tortillas and bread to 3 per day        Will investigate options ot assit patient with cost of lantus and call her by next week when she will hav eot purchase more.   Diabetes Self Management Support: husband, interpreters, CDE, office staff Follow-up: 3-4 weeks with meter

## 2010-02-06 NOTE — Assessment & Plan Note (Signed)
Summary: EST-F/U VISIT/CH   Vital Signs:  Patient profile:   40 year old female Height:      60.5 inches (153.67 cm) Weight:      162.2 pounds (73.73 kg) BMI:     31.27 Temp:     97.1 degrees F (36.17 degrees C) oral Pulse rate:   89 / minute BP sitting:   125 / 77  (right arm) Cuff size:   regular  Vitals Entered By: Cynda Familia Duncan Dull) (January 06, 2010 3:08 PM) CC: f/u diabetes Is Patient Diabetic? Yes Did you bring your meter with you today? No Pain Assessment Patient in pain? no      Nutritional Status BMI of > 30 = obese  Have you ever been in a relationship where you felt threatened, hurt or afraid?No   Does patient need assistance? Functional Status Self care Ambulation Normal   Primary Care Provider:  Lars Mage MD  CC:  f/u diabetes.  History of Present Illness: Patient is 40 year old patient of mine who is here today for a regular follow for her DM which has been uncontrolled in recent months.  BP is well controlled on current meds.  Complains of pin prick pain in the middle of the chest, no exacerbating or relieving factors, sharp 4-5/10 pain, non radiating, not related to exertion or walking. Not related to food. Worse in afternoons. present since last 2-3 weeks and is persistent.  Complains of thin whitish discharge form her vagina with itching since last 10-12 days. Denies physical exam.   She denies any new sicknesses or hospitalizations, no chest pain episodes, no fevers, no chills, no abdominal or urinary concerns. No recent changes in appetite, weight.    Preventive Screening-Counseling & Management  Alcohol-Tobacco     Alcohol drinks/day: 0     Smoking Status: never  Problems Prior to Update: 1)  Transient Disorder Initiating/maintaining Sleep  (ICD-307.41) 2)  Other Chronic Nonalcoholic Liver Disease  (ICD-571.8) 3)  Otitis Media, Acute  (ICD-382.9) 4)  Diabetes Mellitus, Type II  (ICD-250.00) 5)  Hypertension  (ICD-401.9) 6)   Hyperlipidemia  (ICD-272.4) 7)  Health Maintenance Exam  (ICD-V70.0)  Medications Prior to Update: 1)  Metformin Hcl 1000 Mg Tabs (Metformin Hcl) .... Take 1 Tablet Twice A Day, 2)  Lisinopril-Hydrochlorothiazide 20-25 Mg Tabs (Lisinopril-Hydrochlorothiazide) .... Take 1 Tablet By Mouth Once A Day 3)  Lantus 100 Unit/ml Soln (Insulin Glargine) .... Inject 40 Units Once A Day To Control Blood Sugars.  Current Medications (verified): 1)  Metformin Hcl 1000 Mg Tabs (Metformin Hcl) .... Take 1 Tablet Twice A Day, 2)  Lisinopril-Hydrochlorothiazide 20-25 Mg Tabs (Lisinopril-Hydrochlorothiazide) .... Take 1 Tablet By Mouth Once A Day 3)  Lantus 100 Unit/ml Soln (Insulin Glargine) .... Inject 40 Units Once A Day To Control Blood Sugars. 4)  Metronidazole 500 Mg Tabs (Metronidazole) .... Take 4 Tabs By Mouth Once and Then Stop. 5)  Omeprazole 20 Mg Cpdr (Omeprazole) .... Take 1 Tablet By Mouth Once A Day in Tyhe Morning Empty Stomach 6)  Katheren Puller Presto Pro Meter  Devi (Blood Glucose Monitoring Suppl) .... Use To Check Blood Sugar Up To 2x Daily 7)  Wavesense Presto Test  Strp (Glucose Blood) .... Use To Check Blood Sugar Up To 2x Daily 8)  Gnp Insulin Syringe 31g X 5/16" 0.5 Ml Misc (Insulin Syringe-Needle U-100) .... Use To Inject Insulin Once Daily 9)  Lancets  Misc (Lancets) .... Use To Check Blood Sugar As Directed  Allergies (verified): No  Known Drug Allergies  Past History:  Past Medical History: Last updated: 12/23/2005 Diabetes mellitus, type II Hypertension Fatty liver disaease, last LFTs in 10/07 all within normal limits.  Social History: Last updated: 04/17/2009 Married. Recently gave birth to a son 04/2009. Denies EtOH, tobacco, and drug use.  Risk Factors: Alcohol Use: 0 (01/06/2010) Exercise: no (06/11/2009)  Risk Factors: Smoking Status: never (01/06/2010)  Family History: Reviewed history and no changes required.  Social History: Reviewed history from  04/17/2009 and no changes required. Married. Recently gave birth to a son 04/2009. Denies EtOH, tobacco, and drug use.  Review of Systems      See HPI  Physical Exam  Additional Exam:  Gen: AOx3, in no acute distress Eyes: PERRL, EOMI ENT:MMM, No erythema noted in posterior pharynx Neck: No JVD, No LAP Chest: CTAB with  good respiratory effort CVS: regular rhythmic rate, NO M/R/G, S1 S2 normal Abdo: soft,ND, BS+x4, Non tender and No hepatosplenomegaly EXT: No odema noted Neuro: Non focal, gait is normal Skin: no rashes noted.    Impression & Recommendations:  Problem # 1:  VAGINITIS (ICD-616.10) Assessment New Since she denied physical exam there was no way to distinguish between bacterial vaginitis or candidiasis. Since she has had BV in the past, i would treat her with 2Gm flagyl presumptively and ask her to follow up as needed if this does not get resolved. She denies recent antibiotic use.  Her updated medication list for this problem includes:    Metronidazole 500 Mg Tabs (Metronidazole) .Marland Kitchen... Take 4 tabs by mouth once and then stop.  Problem # 2:  GASTROESOPHAGEAL REFLUX DISEASE (ICD-530.81) Assessment: New Symptoms sounds like reflux with night cough. Would give a 21 day trial to PPI's and reassess if symptoms are persistent. Age and pain not reproducible with exertion or pressure makes ischemic disease and costochondritis less likely.  Her updated medication list for this problem includes:    Omeprazole 20 Mg Cpdr (Omeprazole) .Marland Kitchen... Take 1 tablet by mouth once a day in tyhe morning empty stomach  Problem # 3:  DIABETES MELLITUS, TYPE II (ICD-250.00) Assessment: Unchanged  Have her see Jamison Neighbor today. patient wasstarted on Lantus and has been counselled about importance of weight loss. Will check Hba1c in 2 months time.  Her updated medication list for this problem includes:    Metformin Hcl 1000 Mg Tabs (Metformin hcl) .Marland Kitchen... Take 1 tablet twice a day,     Lisinopril-hydrochlorothiazide 20-25 Mg Tabs (Lisinopril-hydrochlorothiazide) .Marland Kitchen... Take 1 tablet by mouth once a day    Lantus 100 Unit/ml Soln (Insulin glargine) ..... Inject 40 units once a day to control blood sugars.  Orders: Diabetic Clinic Referral (Diabetic)  Labs Reviewed: Creat: 0.63 (09/10/2009)     Last Eye Exam: No diabetic retinopathy.    (09/19/2009) Reviewed HgBA1c results: 8.9 (12/02/2009)  9.2 (07/15/2009)  Problem # 4:  HYPERTENSION (ICD-401.9) Assessment: Unchanged At goal.  Her updated medication list for this problem includes:    Lisinopril-hydrochlorothiazide 20-25 Mg Tabs (Lisinopril-hydrochlorothiazide) .Marland Kitchen... Take 1 tablet by mouth once a day  BP today: 125/77 Prior BP: 122/81 (12/02/2009)  Labs Reviewed: K+: 4.2 (09/10/2009) Creat: : 0.63 (09/10/2009)   Chol: 159 (07/15/2009)   HDL: 46 (07/15/2009)   LDL: 62 (07/15/2009)   TG: 256 (07/15/2009)  Problem # 5:  HYPERLIPIDEMIA (ICD-272.4) Assessment: Unchanged Due for one. Will check it at next follow up as she is not fasting today.  Labs Reviewed: SGOT: 43 (09/10/2009)   SGPT: 82 (09/10/2009)  HDL:46 (07/15/2009), 46 (02/17/2008)  LDL:62 (07/15/2009), 81 (02/17/2008)  Chol:159 (07/15/2009), 154 (02/17/2008)  Trig:256 (07/15/2009), 137 (02/17/2008)  Complete Medication List: 1)  Metformin Hcl 1000 Mg Tabs (Metformin hcl) .... Take 1 tablet twice a day, 2)  Lisinopril-hydrochlorothiazide 20-25 Mg Tabs (Lisinopril-hydrochlorothiazide) .... Take 1 tablet by mouth once a day 3)  Lantus 100 Unit/ml Soln (Insulin glargine) .... Inject 40 units once a day to control blood sugars. 4)  Metronidazole 500 Mg Tabs (Metronidazole) .... Take 4 tabs by mouth once and then stop. 5)  Omeprazole 20 Mg Cpdr (Omeprazole) .... Take 1 tablet by mouth once a day in tyhe morning empty stomach 6)  Katheren Puller Presto Pro Meter Devi (Blood glucose monitoring suppl) .... Use to check blood sugar up to 2x daily 7)  [facility]  Test Strp (Glucose blood) .... Use to check blood sugar up to 2x daily 8)  Gnp Insulin Syringe 31g X 5/16" 0.5 Ml Misc (Insulin syringe-needle u-100) .... Use to inject insulin once daily 9)  Lancets Misc (Lancets) .... Use to check blood sugar as directed  Patient Instructions: 1)  Please schedule a follow-up appointment in 2 months. 2)  It is important that you exercise regularly at least 20 minutes 5 times a week. If you develop chest pain, have severe difficulty breathing, or feel very tired , stop exercising immediately and seek medical attention. 3)  You need to lose weight. Consider a lower calorie diet and regular exercise.  4)  Check your blood sugars regularly. If your readings are usually above : 200 or below 70 you should contact our office. 5)  It is important that your Diabetic A1c level is checked every 3 months. Prescriptions: LANCETS  MISC (LANCETS) use to check blood sugar as directed  #60 x 11   Entered and Authorized by:   Lars Mage MD   Signed by:   Lars Mage MD on 01/06/2010   Method used:   Faxed to ...       [facility] Department (retail)       [address] [place], [place]  [address]       Ph: [phone]       Fax: [phone]   RxID:   [id number] GNP INSULIN SYRINGE 31G X 5/16" 0.5 ML MISC (INSULIN SYRINGE-NEEDLE U-100) use to inject insulin once daily  #30 x 11   Entered and Authorized by:   Lars Mage MD   Signed by:   Lars Mage MD on 01/06/2010   Method used:   Faxed to ...       [facility] Department (retail)       [address] [place], [place]  [address]       Ph: [phone]       Fax: [phone]   RxID:   [id number] WAVESENSE PRESTO TEST  STRP (GLUCOSE BLOOD) use to check blood sugar up to 2x daily  #60 x 11   Entered and Authorized by:   Lars Mage MD   Signed by:   Lars Mage MD on 01/06/2010   Method used:   Faxed to ...       [facility] Department (retail)       [address] [place], [place]  [address]       Ph: [phone]       Fax: [phone]   RxID:  [id number] WAVESENSE PRESTO PRO METER  DEVI (BLOOD GLUCOSE MONITORING SUPPL) use to check blood sugar up to 2x daily  #1 x 0   Entered and Authorized by:   Lars Mage MD   Signed by:   Lars Mage MD on 01/06/2010   Method used:   Faxed to ...       [facility] Department (retail)       [address] [place], [place]  [address]       Ph: [phone]       Fax: [phone]   RxID:   [id number] LANTUS 100 UNIT/ML SOLN (INSULIN GLARGINE) Inject 40 units once a day to control blood sugars.  #1 x 11   Entered and Authorized by:   Lars Mage MD   Signed by:   Lars Mage MD on 01/06/2010   Method used:   Faxed to ...       [facility] Department (retail)       [address] [place], [place]  [address]       Ph: [phone]       Fax: [phone]   RxID:   [id number] OMEPRAZOLE 20 MG CPDR (OMEPRAZOLE) Take 1 tablet by mouth once a day in tyhe morning empty stomach  #21 x 0   Entered and Authorized by:   Lars Mage MD   Signed by:   Lars Mage MD on 01/06/2010   Method used:   Print then Give to Patient   RxID:   [id number] METRONIDAZOLE 500 MG TABS (METRONIDAZOLE) Take 4 tabs by mouth once and then stop.  #4 x 0   Entered and Authorized by:   Lars Mage MD   Signed by:   Lars Mage MD on 01/06/2010   Method used:   Print then Give to Patient   RxID:   [id number]    Orders Added: 1)  Diabetic Clinic Referral [Diabetic] 2)  Est. Patient Level IV [[address]]    Prevention & Chronic Care Immunizations   Influenza vaccine: Not documented   Influenza vaccine deferral: Deferred  (12/02/2009)    Tetanus booster: 07/15/2009: Tdap    Pneumococcal vaccine: Not documented  Other Screening   Pap smear: NEGATIVE FOR INTRAEPITHELIAL LESIONS OR MALIGNANCY.  (12/02/2009)   Pap smear action/deferral: Refused  (08/19/2009)   Smoking  status: never  (01/06/2010)  Diabetes Mellitus   HgbA1C: 8.9  (12/02/2009)    Eye exam: No diabetic retinopathy.     (09/19/2009)   Diabetic eye exam action/deferral: Ophthalmology referral  (08/19/2009)   Eye exam due: 09/2010    Foot exam: yes  (04/17/2009)   Foot exam action/deferral: Do today   High risk foot: Not documented   Foot care education: Done  (04/17/2009)    Urine microalbumin/creatinine ratio: 13.1  (04/17/2009)   Urine microalbumin action/deferral: Ordered    Diabetes flowsheet reviewed?: Yes   Progress toward A1C goal: Unchanged  Lipids   Total Cholesterol: 159  (07/15/2009)   LDL: 62  (07/15/2009)   LDL Direct: Not documented   HDL: 46  (07/15/2009)   Triglycerides: 256  (07/15/2009)   Lipid panel due: 05/06/2009    SGOT (AST): 43  (09/10/2009)   SGPT (ALT): 82  (09/10/2009)   Alkaline phosphatase: 107  (09/10/2009)   Total bilirubin: 0.4  (09/10/2009)    Lipid flowsheet reviewed?: Yes   Progress toward LDL goal: At goal  Hypertension   Last  Blood Pressure: 125 / 77  (01/06/2010)   Serum creatinine: 0.63  (09/10/2009)   Serum potassium 4.2  (09/10/2009)    Hypertension flowsheet reviewed?: Yes   Progress toward BP goal: At goal  Self-Management Support :   Personal Goals (by the next clinic visit) :     Personal A1C goal: 7  (04/17/2009)     Personal blood pressure goal: 130/80  (04/17/2009)     Personal LDL goal: 100  (04/17/2009)    Patient will work on the following items until the next clinic visit to reach self-care goals:     Medications and monitoring: take my medicines every day  (01/06/2010)     Eating: eat foods that are low in salt, eat baked foods instead of fried foods  (08/19/2009)     Activity: take a 30 minute walk every day, take the stairs instead of the elevator, park at the far end of the parking lot  (07/15/2009)     Other: bring your meter to every visit  (08/19/2009)    Diabetes self-management support: Written  self-care plan, Referred for DM self-management training  (01/06/2010)   Diabetes care plan printed   Referred for diabetes self-mgmt training.    Hypertension self-management support: Written self-care plan  (01/06/2010)   Hypertension self-care plan printed.    Lipid self-management support: Written self-care plan  (01/06/2010)   Lipid self-care plan printed.   Nursing Instructions: Refer for diabetes self-management training (see order)   Diabetes Self Management Training Referral Patient Name: Robin Klein Date Of Birth: May 20, 1970 MRN: [id number] Current Diagnosis:  GASTROESOPHAGEAL REFLUX DISEASE (ICD-530.81) VAGINITIS (ICD-616.10) OTHER CHRONIC NONALCOHOLIC LIVER DISEASE (ICD-571.8) DIABETES MELLITUS, TYPE II (ICD-250.00) HYPERTENSION (ICD-401.9) HYPERLIPIDEMIA (ICD-272.4) HEALTH MAINTENANCE EXAM (ICD-V70.0)     Management Training Needs:   Initial Diabetes Self management Training   Initial Medical Nutrition Therapy(3 hours/1st year)  Complicating Conditions:  HTN  Barriers:  Language

## 2010-02-06 NOTE — Progress Notes (Signed)
Summary: lantus from MAP/dmr  Phone Note Outgoing Call   Call placed by: Jamison Neighbor RD,CDE,  January 09, 2010 3:24 PM Summary of Call: called [facility] about lantus- they have samples but will only give if patient completes paperwork for [facility]- they thought this patient did not have Social Security #- I told them i see one in her chart. Patient had told me she id not have a social and financial counselor also beleives patient doe snot have a SS#.  They said she should be able to apply for lantus through MAP with taxes and paystubs along with Social security card.  Follow-up for Phone Call        Tried calling patient 3x with language line and they were hung up on 3 times. will try in the monring when her children are at school. Follow-up by: Jamison Neighbor RD,CDE,  January 09, 2010 3:34 PM  Additional Follow-up for Phone Call Additional follow up Details #1::        Instead of calling- mailed letter translated into spanish and english explaining what MAP needs to be able to help her get lantus. i included coupons for lantus and the [employer] card.   Additional Follow-up by: Jamison Neighbor RD,CDE,  January 10, 2010 6:04 PM

## 2010-02-21 ENCOUNTER — Ambulatory Visit (INDEPENDENT_AMBULATORY_CARE_PROVIDER_SITE_OTHER): Payer: Self-pay | Admitting: Dietician

## 2010-02-21 DIAGNOSIS — E119 Type 2 diabetes mellitus without complications: Secondary | ICD-10-CM

## 2010-02-21 NOTE — Progress Notes (Signed)
Diabetes Self-Management Training (DSMT)  Follow-Up 1 Visit  02/21/2010 Ms. [name], identified by name and date of birth, is a 40 y.o. female with Type 2 Diabetes. Year of diabetes diagnosis:  Other persons present: family member patient and daughter  ASSESSMENT Patient concerns are Glycemic control and Weight control. Weight 161 lb 3.2 oz (73.12 kg).A1c will be due at the end of this month There is no height on file to calculate BMI. No results found for this basename: [facility]   Lab Results  Component Value Date   HGBA1C 8.9 12/02/2009   Medication Nutrition Monitor: Presto wave sense Patients belief/attitude about diabetes: Diabetes can be controlled. Self foot exams daily: Yes Diabetes Complications: None Labs reviewed.  DIABETES BUNDLE: A1C in past 6 months? Yes.  Less than 7%? No LDL in past year? Yes.  Less than 100 mg/dL? Yes Microalbumin ratio in past year? Yes. Blood pressure less than 130/80? Yes. Foot exam in last year? Yes. Eye exam in past year? Yes. Tobacco use? No. Pneumovax? No Flu vaccine? No Asprin? No  Family history of diabetes: No Support systems: spouse friends Special needs: needs Psychologist, forensic and interpreter who was present with [place] today Prior DM Education: Yes   Medications See Medications list.  Has adequate knowledge     Exercise Plan Doing ADLs and walking for 30 minutesa week.   Self-Monitoring Frequency of testing: 1-2 times/day Breakfast: 2/13 fasting blood sugars < 145, most 160-300 range  Hyperglycemia: Yes Weekly Hypoglycemia: No   Meal Planning Knowledgable and Interested in improving Assessment comments:Patient reports having a hard time making healthy choices at parties or around others who eat larger portions.     INDIVIDUAL DIABETES EDUCATION PLAN:  Nutrition management Physical activity and  exercise _______________________________________________________________________  Intervention TOPICS COVERED TODAY:  Nutrition management  Information on hints to eating out and maintain blood glucose control. Physical activity and exercise  Role of exercise on diabetes management, blood pressure control and cardiac health. Helped patient identify appropriate exercises in relation to his/her diabetes, diabetes complications and other health issue.  PATIENTS GOALS/PLAN (copy and paste in patient instructions so patient receives a copy): 1.  Learning Objective:       Learn how to maintain blood sugars in desired range for more than half the time 2.  Behavioral Objective:         Nutrition: To improve blood glucose control I will follow meal plan of 2-3 carb choices (30-45 grams) carb at each meal Half of the time 50% Physical Activity: For improved blood glucose control and decrease insulin resistance, I will exercise 5 days a week  Sometimes 25%  Personalized Follow-Up Plan for Ongoing Self Management Support:  Doctor's Office, church, friends, family and CDE visits ______________________________________________________________________   Outcomes Expected outcomes: Demonstrated interest in learning.Expect positive changes in lifestyle. Self-care Barriers: English as second language, Lack of child care Education material provided: information on pedometers- patient to purchase one and begin to track steps Patient to contact team via Phone if problems or questions.  Time in: 1000     Time out: 1030  Future DSMT - 4-6 wks   RILEY,Sabastian Raimondi

## 2010-03-04 ENCOUNTER — Telehealth: Payer: Self-pay | Admitting: Dietician

## 2010-03-04 NOTE — Telephone Encounter (Signed)
Called patient with assistance of interpreter to be sure patient understood how to wear, use and set goals with the pedometer she started using yesterday to track her steps. Patient said she is wearing it without problem, zeroed it without problem and understood to wear it daily. Her goal is to get 8000 steps each day until return visit.   Plan is to eventually try to increase her activity to 10000 steps a day for weight loss.

## 2010-03-17 ENCOUNTER — Ambulatory Visit (INDEPENDENT_AMBULATORY_CARE_PROVIDER_SITE_OTHER): Payer: Self-pay | Admitting: Dietician

## 2010-03-17 ENCOUNTER — Ambulatory Visit (INDEPENDENT_AMBULATORY_CARE_PROVIDER_SITE_OTHER): Payer: Self-pay | Admitting: Internal Medicine

## 2010-03-17 ENCOUNTER — Encounter: Payer: Self-pay | Admitting: Internal Medicine

## 2010-03-17 VITALS — BP 118/81 | HR 94 | Temp 97.0°F | Ht 60.5 in | Wt 161.4 lb

## 2010-03-17 DIAGNOSIS — K219 Gastro-esophageal reflux disease without esophagitis: Secondary | ICD-10-CM

## 2010-03-17 DIAGNOSIS — E785 Hyperlipidemia, unspecified: Secondary | ICD-10-CM

## 2010-03-17 DIAGNOSIS — E119 Type 2 diabetes mellitus without complications: Secondary | ICD-10-CM

## 2010-03-17 DIAGNOSIS — Z23 Encounter for immunization: Secondary | ICD-10-CM

## 2010-03-17 DIAGNOSIS — M79609 Pain in unspecified limb: Secondary | ICD-10-CM

## 2010-03-17 DIAGNOSIS — M79641 Pain in right hand: Secondary | ICD-10-CM

## 2010-03-17 DIAGNOSIS — I10 Essential (primary) hypertension: Secondary | ICD-10-CM

## 2010-03-17 LAB — GLUCOSE, CAPILLARY: Glucose-Capillary: 182 mg/dL — ABNORMAL HIGH (ref 70–99)

## 2010-03-17 LAB — POCT GLYCOSYLATED HEMOGLOBIN (HGB A1C): Hemoglobin A1C: 7.6
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
## 2010-03-17 NOTE — Progress Notes (Signed)
Diabetes Self-Management Training (DSMT)  Follow Up Visit 2  03/17/2010 [name], identified by name and date of birth, is a 40 y.o. female with Type 2 Diabetes. Year of diabetes diagnosis:  Other persons present:interpreter and daughter  ASSESSMENT Patient concerns are Glycemic control and Weight control. A1c done today was decreased to 7.6% and glucose was 182 mg/dl.   Lab Results  Component Value Date   HGBA1C 8.9 12/02/2009   Medication Nutrition Monitor: Presto wave sense Patients belief/attitude about diabetes: Diabetes can be controlled. Self foot exams daily: Yes Diabetes Complications: None Labs reviewed.  DIABETES BUNDLE: A1C in past 6 months? Yes.  Less than 7%? No LDL in past year? Yes.  Less than 100 mg/dL? Yes Microalbumin ratio in past year? Yes. Blood pressure less than 130/80? Yes. Foot exam in last year? Yes. Eye exam in past year? Yes. Tobacco use? No. Pneumovax? No Flu vaccine? No Asprin? No  Family history of diabetes: No Support systems: spouse and friends Special needs: needs Psychologist, forensic and interpreter who was present with [place] today Prior DM Education: Yes   Medications See Medications list.  Has adequate knowledge and is taking as prescribed   Exercise Plan Doing ADLs and walking for 5000- 12000 steps a day after 2 weeks. Her goal is 10,000-12,000 steps a day to decrease her weight and need for insulin.   Self-Monitoring Frequency of testing: 1 times/day Breakfast: 3/17 fasting blood sugars < 130, most 160-200 range  Hyperglycemia: noy Hypoglycemia: No   Meal Planning Knowledgable and Interested in improving Assessment comments:Patient is verbalizing very good comprehesion of knowing importance of  limiting portions to 2-3 carb portions per meals and < 1 [portion for snacks   INDIVIDUAL DIABETES EDUCATION PLAN:  Nutrition management Physical activity and exercise Goal  setting _______________________________________________________________________  Intervention TOPICS COVERED TODAY:  Review of meal planning Review of exercise goals and step records Goal setting-    PATIENTS GOALS/PLAN (copy and paste in patient instructions so patient receives a copy): 1.  Learning Objective:       Learn how to maintain blood sugars in desired range for more than half the time 2.  Behavioral Objective:         Nutrition 75%             Exercise is now walking > 5000 on ll days, so 100% of previous goal set has been                          Achieved.           New goal is to try to attain [address] steps 5 days a week for the next month  Personalized Follow-Up Plan for Ongoing Self Management Support:  Doctor's Office, church, friends, family and CDE visits ______________________________________________________________________   Outcomes Expected outcomes: Demonstrated interest in learning.Expect positive changes in lifestyle. Self-care Barriers: English as second language, Lack of child care Education material provided: information on pedometers- patient to purchase one and begin to track steps Patient to contact team via Phone if problems or questions.  Time in: 1400     Time out: 1430  Future DSMT - 4 wks per patient request   RILEY,Robin Klein

## 2010-03-17 NOTE — Assessment & Plan Note (Signed)
Patient denies any symptoms of reflux recently and states that she does not require a meet with all any more. I would remove that from her medication list today.

## 2010-03-17 NOTE — Patient Instructions (Signed)
Obesidad (Obesity) Se define la obesidad como tener un ndice de [facility]) de 30 o ms. Para calcular su IMC, divida su peso en libras sobre su altura en pulgadas al cuadrado y multiplique eso por 703. Las enfermedades ms importantes relacionadas con la obesidad a largo plazo incluyen:  Ictus.   Enfermedades cardacas.   Diabetes.   Distintos tipo de Geologist, engineering.  La obesidad tambin complica la recuperacin de otros problemas mdicos.  CAUSAS  Historia de obesidad en sus padres.   Desequilibrio de hormona tiroidea.   Factores ambientales tales como el exceso de ingesta de caloras y sedentarismo.  TRATAMIENTO Un programa de prdida de peso saludable incluye:  Una dieta baja en caloras basada en las necesidades calricas individuales.   Aumento de la actividad fsica (ejercicio).  Un programa de ejercicios es tan importante como una dieta baja en caloras.  Los medicamentos para bajar de peso deberan utilizarse slo bajo supervisin de su mdico. Estos medicamentos ayudan, pero slo si se utilizan con programas ejercicio y Production assistant, radio. Los medicamentos pueden tener efectos adversos que incluyen nerviosismo, nuseas, dolor abdominal, diarrea, dolor de [place], adormecimiento y depresin.  Un programa de prdida de peso no saludable incluye:  Ayunar.   Programas de dieta que estn a la moda.   Suplementos y drogas.  Estas opciones fallan al lograr controlar el peso a largo plazo.  INSTRUCCIONES PARA EL CUIDADO DOMICILIARIO Para ayudarlo a realizar los cambios necesarios en su dieta:   Mantenga un registro diario de todo lo que come. Existen muchos sitios web gratuitos que lo ayudarn a Administrator, sports. Puede ser til [employer] que consume para determinar si las porciones que est comiendo son del tamao correcto.   Utilice recetarios de alimentos bajos en caloras o tome clases especiales de cocina.   Evite el alcohol. Tome ms agua y bebidas sin caloras.    Tome las vitaminas y los suplementos segn se lo ha prescrito el profesional que lo asiste.   Los grupos de apoyo de prdida de [place], los dietistas registrados, orientadores y Publishing rights manager para reduccin del estrs tambin puede ser de [place].  PREVENCIN Perder peso y el mantenimiento llevan tiempo, disciplina, una dieta saludable y ejercicio regular. Document Released: 12/22/2004 Document Re-Released: 03/18/2009 [facility] Patient Information 2011 [place], [place].

## 2010-03-17 NOTE — Assessment & Plan Note (Signed)
Well controlled no new changes at this time

## 2010-03-17 NOTE — Assessment & Plan Note (Signed)
I will check patient's cholesterol levels in 3 months. Continue to encourage weight loss.
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
## 2010-03-17 NOTE — Assessment & Plan Note (Signed)
The patient has been taking only 30 units at night. Looking at her blood glucose readings patient would need to go up by 2 units. Patient was also advised 2 monitor her morning blood glucose levels and go up on her Lantus by 2 units if morning CBGs are consistently above 1:30. Patient was asked to report to the clinic if she experiences chills with sweating or has her blood sugar levels below 70. Patient had her eye exam 5 months ago and it was normal. Patient is already on lisinopril for kidney protection. The patient's blood pressure is well controlled.

## 2010-03-17 NOTE — Patient Instructions (Signed)
New goal is to try to attain [address] steps 5 days a week for the next month  See you in 1 month.  Doing a great job taking care of your diabetes!  Jamison Neighbor [phone]

## 2010-03-18 ENCOUNTER — Ambulatory Visit (HOSPITAL_COMMUNITY)
Admission: RE | Admit: 2010-03-18 | Discharge: 2010-03-18 | Disposition: A | Discharge status: Home / Self Care | Payer: Self-pay | Source: Ambulatory Visit | Attending: Internal Medicine | Admitting: Internal Medicine

## 2010-03-18 DIAGNOSIS — M79609 Pain in unspecified limb: Secondary | ICD-10-CM | POA: Insufficient documentation

## 2010-03-18 DIAGNOSIS — E785 Hyperlipidemia, unspecified: Secondary | ICD-10-CM

## 2010-03-18 DIAGNOSIS — I10 Essential (primary) hypertension: Secondary | ICD-10-CM | POA: Insufficient documentation

## 2010-03-18 DIAGNOSIS — K219 Gastro-esophageal reflux disease without esophagitis: Secondary | ICD-10-CM | POA: Insufficient documentation

## 2010-03-18 DIAGNOSIS — E119 Type 2 diabetes mellitus without complications: Secondary | ICD-10-CM | POA: Insufficient documentation

## 2010-03-18 DIAGNOSIS — M79641 Pain in right hand: Secondary | ICD-10-CM

## 2010-03-20 LAB — POCT INFECTIOUS MONO SCREEN: Mono Screen: NEGATIVE

## 2010-03-22 LAB — POCT URINALYSIS DIP (DEVICE)
Bilirubin Urine: NEGATIVE
Bilirubin Urine: NEGATIVE
Glucose, UA: NEGATIVE mg/dL
Glucose, UA: NEGATIVE mg/dL
Hgb urine dipstick: NEGATIVE
Hgb urine dipstick: NEGATIVE
Ketones, ur: NEGATIVE mg/dL
Ketones, ur: NEGATIVE mg/dL
Specific Gravity, Urine: 1.015 (ref 1.005–1.030)
Specific Gravity, Urine: 1.02 (ref 1.005–1.030)
pH: 6 (ref 5.0–8.0)

## 2010-03-23 LAB — POCT URINALYSIS DIP (DEVICE)
Bilirubin Urine: NEGATIVE
Bilirubin Urine: NEGATIVE
Bilirubin Urine: NEGATIVE
Glucose, UA: NEGATIVE mg/dL
Glucose, UA: NEGATIVE mg/dL
Glucose, UA: NEGATIVE mg/dL
Hgb urine dipstick: NEGATIVE
Ketones, ur: NEGATIVE mg/dL
Ketones, ur: NEGATIVE mg/dL
Nitrite: NEGATIVE
Nitrite: NEGATIVE
Specific Gravity, Urine: 1.02 (ref 1.005–1.030)
pH: 6.5 (ref 5.0–8.0)

## 2010-03-26 LAB — COMPREHENSIVE METABOLIC PANEL
ALT: 20 U/L (ref 0–35)
ALT: 20 U/L (ref 0–35)
Albumin: 2.6 g/dL — ABNORMAL LOW (ref 3.5–5.2)
Alkaline Phosphatase: 107 U/L (ref 39–117)
Alkaline Phosphatase: 122 U/L — ABNORMAL HIGH (ref 39–117)
CO2: 21 mEq/L (ref 19–32)
Calcium: 8.6 mg/dL (ref 8.4–10.5)
Calcium: 9.5 mg/dL (ref 8.4–10.5)
GFR calc Af Amer: >60 mL/min (ref >60)
GFR calc non Af Amer: >60 mL/min (ref >60)
Glucose, Bld: 63 mg/dL — ABNORMAL LOW (ref 70–99)
Potassium: 4 mEq/L (ref 3.5–5.1)
Sodium: 135 mEq/L (ref 135–145)
Sodium: 136 mEq/L (ref 135–145)
Total Bilirubin: 0.5 mg/dL (ref 0.3–1.2)
Total Protein: 5.8 g/dL — ABNORMAL LOW (ref 6.0–8.3)

## 2010-03-26 LAB — GLUCOSE, CAPILLARY
Glucose-Capillary: 110 mg/dL — ABNORMAL HIGH (ref 70–99)
Glucose-Capillary: 173 mg/dL — ABNORMAL HIGH (ref 70–99)
Glucose-Capillary: 177 mg/dL — ABNORMAL HIGH (ref 70–99)
Glucose-Capillary: 206 mg/dL — ABNORMAL HIGH (ref 70–99)
Glucose-Capillary: 207 mg/dL — ABNORMAL HIGH (ref 70–99)
Glucose-Capillary: 91 mg/dL (ref 70–99)
Glucose-Capillary: 96 mg/dL (ref 70–99)
Glucose-Capillary: 96 mg/dL (ref 70–99)

## 2010-03-26 LAB — POCT URINALYSIS DIP (DEVICE)
Bilirubin Urine: NEGATIVE
Bilirubin Urine: NEGATIVE
Bilirubin Urine: NEGATIVE
Bilirubin Urine: NEGATIVE
Glucose, UA: 100 mg/dL — AB
Glucose, UA: 250 mg/dL — AB
Hgb urine dipstick: NEGATIVE
Hgb urine dipstick: NEGATIVE
Ketones, ur: NEGATIVE mg/dL
Ketones, ur: NEGATIVE mg/dL
Ketones, ur: NEGATIVE mg/dL
Nitrite: NEGATIVE
Nitrite: NEGATIVE
Protein, ur: NEGATIVE mg/dL
Protein, ur: NEGATIVE mg/dL
Specific Gravity, Urine: 1.015 (ref 1.005–1.030)
Specific Gravity, Urine: 1.02 (ref 1.005–1.030)
Urobilinogen, UA: 0.2 mg/dL (ref 0.0–1.0)
pH: 6 (ref 5.0–8.0)
pH: 6 (ref 5.0–8.0)
pH: 6 (ref 5.0–8.0)

## 2010-03-26 LAB — CBC
HCT: 36.4 % (ref 36.0–46.0)
Hemoglobin: 12.5 g/dL (ref 12.0–15.0)
Hemoglobin: 13.3 g/dL (ref 12.0–15.0)
MCHC: 34.4 g/dL (ref 30.0–36.0)
Platelets: 206 10*3/uL (ref 150–400)
RBC: 3.85 MIL/uL — ABNORMAL LOW (ref 3.87–5.11)
RBC: 4.11 MIL/uL (ref 3.87–5.11)
RDW: 14.6 % (ref 11.5–15.5)
RDW: 15.1 % (ref 11.5–15.5)
WBC: 8.3 10*3/uL (ref 4.0–10.5)

## 2010-03-26 LAB — LACTATE DEHYDROGENASE: LDH: 133 U/L (ref 94–250)

## 2010-03-26 LAB — URINALYSIS, ROUTINE W REFLEX MICROSCOPIC
Bilirubin Urine: NEGATIVE
Hgb urine dipstick: NEGATIVE
Nitrite: NEGATIVE
Specific Gravity, Urine: 1.025 (ref 1.005–1.030)
Urobilinogen, UA: 0.2 mg/dL (ref 0.0–1.0)
pH: 6 (ref 5.0–8.0)

## 2010-03-26 LAB — CCBB MATERNAL DONOR DRAW

## 2010-03-28 LAB — POCT URINALYSIS DIP (DEVICE)
Bilirubin Urine: NEGATIVE
Hgb urine dipstick: NEGATIVE
Hgb urine dipstick: NEGATIVE
Hgb urine dipstick: NEGATIVE
Hgb urine dipstick: NEGATIVE
Ketones, ur: NEGATIVE mg/dL
Ketones, ur: NEGATIVE mg/dL
Ketones, ur: NEGATIVE mg/dL
Ketones, ur: NEGATIVE mg/dL
Protein, ur: NEGATIVE mg/dL
Protein, ur: NEGATIVE mg/dL
Protein, ur: NEGATIVE mg/dL
Protein, ur: NEGATIVE mg/dL
Specific Gravity, Urine: 1.01 (ref 1.005–1.030)
Specific Gravity, Urine: 1.015 (ref 1.005–1.030)
Specific Gravity, Urine: 1.02 (ref 1.005–1.030)
Specific Gravity, Urine: 1.02 (ref 1.005–1.030)
Urobilinogen, UA: 0.2 mg/dL (ref 0.0–1.0)
Urobilinogen, UA: 0.2 mg/dL (ref 0.0–1.0)
pH: 5 (ref 5.0–8.0)
pH: 6 (ref 5.0–8.0)
pH: 6 (ref 5.0–8.0)
pH: 6.5 (ref 5.0–8.0)

## 2010-04-07 LAB — POCT URINALYSIS DIP (DEVICE)
Hgb urine dipstick: NEGATIVE
Hgb urine dipstick: NEGATIVE
Nitrite: NEGATIVE
Nitrite: NEGATIVE
Protein, ur: NEGATIVE mg/dL
Specific Gravity, Urine: 1.025 (ref 1.005–1.030)
Urobilinogen, UA: 0.2 mg/dL (ref 0.0–1.0)
pH: 5.5 (ref 5.0–8.0)
pH: 5.5 (ref 5.0–8.0)

## 2010-04-08 LAB — POCT URINALYSIS DIP (DEVICE)
Glucose, UA: NEGATIVE mg/dL
Hgb urine dipstick: NEGATIVE
Ketones, ur: NEGATIVE mg/dL
Nitrite: NEGATIVE
Protein, ur: NEGATIVE mg/dL
Protein, ur: NEGATIVE mg/dL
Specific Gravity, Urine: 1.015 (ref 1.005–1.030)
Specific Gravity, Urine: 1.02 (ref 1.005–1.030)
Urobilinogen, UA: 0.2 mg/dL (ref 0.0–1.0)
Urobilinogen, UA: 0.2 mg/dL (ref 0.0–1.0)
pH: 5.5 (ref 5.0–8.0)

## 2010-04-09 LAB — POCT URINALYSIS DIP (DEVICE)
Bilirubin Urine: NEGATIVE
Bilirubin Urine: NEGATIVE
Bilirubin Urine: NEGATIVE
Glucose, UA: NEGATIVE mg/dL
Glucose, UA: NEGATIVE mg/dL
Glucose, UA: NEGATIVE mg/dL
Hgb urine dipstick: NEGATIVE
Hgb urine dipstick: NEGATIVE
Ketones, ur: NEGATIVE mg/dL
Nitrite: NEGATIVE
Nitrite: NEGATIVE
Nitrite: NEGATIVE
Nitrite: NEGATIVE
Nitrite: NEGATIVE
Protein, ur: NEGATIVE mg/dL
Protein, ur: NEGATIVE mg/dL
Urobilinogen, UA: 0.2 mg/dL (ref 0.0–1.0)
Urobilinogen, UA: 0.2 mg/dL (ref 0.0–1.0)
Urobilinogen, UA: 0.2 mg/dL (ref 0.0–1.0)
Urobilinogen, UA: 0.2 mg/dL (ref 0.0–1.0)
pH: 5 (ref 5.0–8.0)
pH: 5 (ref 5.0–8.0)
pH: 5.5 (ref 5.0–8.0)

## 2010-04-10 LAB — POCT URINALYSIS DIP (DEVICE)
Bilirubin Urine: NEGATIVE
Bilirubin Urine: NEGATIVE
Glucose, UA: NEGATIVE mg/dL
Hgb urine dipstick: NEGATIVE
Hgb urine dipstick: NEGATIVE
Hgb urine dipstick: NEGATIVE
Ketones, ur: NEGATIVE mg/dL
Ketones, ur: NEGATIVE mg/dL
Nitrite: NEGATIVE
Protein, ur: NEGATIVE mg/dL
Protein, ur: NEGATIVE mg/dL
Specific Gravity, Urine: 1.025 (ref 1.005–1.030)
Specific Gravity, Urine: 1.02 (ref 1.005–1.030)
Urobilinogen, UA: 0.2 mg/dL (ref 0.0–1.0)
Urobilinogen, UA: 1 mg/dL (ref 0.0–1.0)
pH: 6 (ref 5.0–8.0)

## 2010-04-10 LAB — URINALYSIS, ROUTINE W REFLEX MICROSCOPIC
Glucose, UA: >1000 mg/dL — AB
Hgb urine dipstick: NEGATIVE
Leukocytes, UA: NEGATIVE
Protein, ur: NEGATIVE mg/dL
Specific Gravity, Urine: 1.02 (ref 1.005–1.030)
pH: 5.5 (ref 5.0–8.0)

## 2010-04-10 LAB — CBC
Hemoglobin: 13 g/dL (ref 12.0–15.0)
MCHC: 34.7 g/dL (ref 30.0–36.0)
RBC: 4.14 MIL/uL (ref 3.87–5.11)
WBC: 9.5 10*3/uL (ref 4.0–10.5)

## 2010-04-10 LAB — GC/CHLAMYDIA PROBE AMP, GENITAL
Chlamydia, DNA Probe: NEGATIVE
GC Probe Amp, Genital: NEGATIVE

## 2010-04-10 LAB — POCT PREGNANCY, URINE: Preg Test, Ur: POSITIVE

## 2010-04-10 LAB — URINE MICROSCOPIC-ADD ON

## 2010-04-11 LAB — POCT URINALYSIS DIP (DEVICE)
Bilirubin Urine: NEGATIVE
Bilirubin Urine: NEGATIVE
Bilirubin Urine: NEGATIVE
Glucose, UA: NEGATIVE mg/dL
Glucose, UA: NEGATIVE mg/dL
Glucose, UA: NEGATIVE mg/dL
Hgb urine dipstick: NEGATIVE
Hgb urine dipstick: NEGATIVE
Ketones, ur: 15 mg/dL — AB
Ketones, ur: NEGATIVE mg/dL
Ketones, ur: NEGATIVE mg/dL
Nitrite: NEGATIVE
Nitrite: NEGATIVE
Nitrite: NEGATIVE
Protein, ur: 30 mg/dL — AB
Specific Gravity, Urine: 1.025 (ref 1.005–1.030)
Specific Gravity, Urine: >=1.03 (ref 1.005–1.030)
Urobilinogen, UA: 0.2 mg/dL (ref 0.0–1.0)
pH: 5 (ref 5.0–8.0)
pH: 5 (ref 5.0–8.0)

## 2010-04-11 LAB — GLUCOSE, CAPILLARY: Glucose-Capillary: 68 mg/dL — ABNORMAL LOW (ref 70–99)

## 2010-04-12 LAB — GLUCOSE, CAPILLARY
Glucose-Capillary: 105 mg/dL — ABNORMAL HIGH (ref 70–99)
Glucose-Capillary: 107 mg/dL — ABNORMAL HIGH (ref 70–99)
Glucose-Capillary: 109 mg/dL — ABNORMAL HIGH (ref 70–99)
Glucose-Capillary: 112 mg/dL — ABNORMAL HIGH (ref 70–99)
Glucose-Capillary: 121 mg/dL — ABNORMAL HIGH (ref 70–99)
Glucose-Capillary: 137 mg/dL — ABNORMAL HIGH (ref 70–99)
Glucose-Capillary: 141 mg/dL — ABNORMAL HIGH (ref 70–99)
Glucose-Capillary: 148 mg/dL — ABNORMAL HIGH (ref 70–99)
Glucose-Capillary: 155 mg/dL — ABNORMAL HIGH (ref 70–99)
Glucose-Capillary: 156 mg/dL — ABNORMAL HIGH (ref 70–99)
Glucose-Capillary: 160 mg/dL — ABNORMAL HIGH (ref 70–99)
Glucose-Capillary: 164 mg/dL — ABNORMAL HIGH (ref 70–99)
Glucose-Capillary: 177 mg/dL — ABNORMAL HIGH (ref 70–99)
Glucose-Capillary: 185 mg/dL — ABNORMAL HIGH (ref 70–99)
Glucose-Capillary: 192 mg/dL — ABNORMAL HIGH (ref 70–99)
Glucose-Capillary: 210 mg/dL — ABNORMAL HIGH (ref 70–99)
Glucose-Capillary: 218 mg/dL — ABNORMAL HIGH (ref 70–99)
Glucose-Capillary: 223 mg/dL — ABNORMAL HIGH (ref 70–99)
Glucose-Capillary: 229 mg/dL — ABNORMAL HIGH (ref 70–99)
Glucose-Capillary: 239 mg/dL — ABNORMAL HIGH (ref 70–99)
Glucose-Capillary: 257 mg/dL — ABNORMAL HIGH (ref 70–99)
Glucose-Capillary: 72 mg/dL (ref 70–99)
Glucose-Capillary: 89 mg/dL (ref 70–99)
Glucose-Capillary: 97 mg/dL (ref 70–99)

## 2010-04-12 LAB — PROTEIN, URINE, 24 HOUR
Protein, 24H Urine: 45 mg/d — ABNORMAL LOW (ref 50–100)
Protein, Urine: 3 mg/dL
Urine Total Volume-UPROT: 1500 mL

## 2010-04-12 LAB — POCT URINALYSIS DIP (DEVICE)
Bilirubin Urine: NEGATIVE
Hgb urine dipstick: NEGATIVE
Ketones, ur: 15 mg/dL — AB
Protein, ur: NEGATIVE mg/dL
Specific Gravity, Urine: 1.02 (ref 1.005–1.030)
pH: 5 (ref 5.0–8.0)

## 2010-04-12 LAB — COMPREHENSIVE METABOLIC PANEL
ALT: 64 U/L — ABNORMAL HIGH (ref 0–35)
AST: 49 U/L — ABNORMAL HIGH (ref 0–37)
Albumin: 3.6 g/dL (ref 3.5–5.2)
Alkaline Phosphatase: 89 U/L (ref 39–117)
BUN: 7 mg/dL (ref 6–23)
CO2: 22 mEq/L (ref 19–32)
Calcium: 8.6 mg/dL (ref 8.4–10.5)
Chloride: 107 mEq/L (ref 96–112)
Creatinine, Ser: 0.59 mg/dL (ref 0.4–1.2)
GFR calc Af Amer: >60 mL/min (ref >60)
GFR calc non Af Amer: >60 mL/min (ref >60)
Glucose, Bld: 289 mg/dL — ABNORMAL HIGH (ref 70–99)
Potassium: 3.9 mEq/L (ref 3.5–5.1)
Sodium: 136 mEq/L (ref 135–145)
Total Bilirubin: 0.3 mg/dL (ref 0.3–1.2)
Total Protein: 6.2 g/dL (ref 6.0–8.3)

## 2010-04-12 LAB — URINALYSIS, ROUTINE W REFLEX MICROSCOPIC
Glucose, UA: >1000 mg/dL — AB
Ketones, ur: 15 mg/dL — AB
Leukocytes, UA: NEGATIVE
Protein, ur: NEGATIVE mg/dL

## 2010-04-12 LAB — CBC
HCT: 41.7 % (ref 36.0–46.0)
Hemoglobin: 14.4 g/dL (ref 12.0–15.0)
Platelets: 243 10*3/uL (ref 150–400)
RBC: 4.7 MIL/uL (ref 3.87–5.11)
WBC: 8.2 10*3/uL (ref 4.0–10.5)

## 2010-04-12 LAB — POCT PREGNANCY, URINE: Preg Test, Ur: POSITIVE

## 2010-04-12 LAB — HEMOGLOBIN A1C: Mean Plasma Glucose: 217 mg/dL

## 2010-04-12 LAB — URINE MICROSCOPIC-ADD ON

## 2010-04-15 LAB — GLUCOSE, CAPILLARY: Glucose-Capillary: 332 mg/dL — ABNORMAL HIGH (ref 70–99)

## 2010-04-16 LAB — URINALYSIS, ROUTINE W REFLEX MICROSCOPIC
Glucose, UA: NEGATIVE mg/dL
Hgb urine dipstick: NEGATIVE
Protein, ur: NEGATIVE mg/dL
Specific Gravity, Urine: >1.03 — ABNORMAL HIGH (ref 1.005–1.030)
pH: 5 (ref 5.0–8.0)

## 2010-04-17 LAB — GLUCOSE, CAPILLARY: Glucose-Capillary: 222 mg/dL — ABNORMAL HIGH (ref 70–99)

## 2010-04-17 LAB — HCG, QUANTITATIVE, PREGNANCY: hCG, Beta Chain, Quant, S: 6523 m[IU]/mL — ABNORMAL HIGH (ref <5)

## 2010-04-22 LAB — GLUCOSE, CAPILLARY: Glucose-Capillary: 264 mg/dL — ABNORMAL HIGH (ref 70–99)

## 2010-04-28 ENCOUNTER — Encounter: Payer: Self-pay | Admitting: Internal Medicine

## 2010-05-20 NOTE — Discharge Summary (Signed)
NAMEKEYANI, Klein      ACCOUNT NO.:  [id number]   MEDICAL RECORD NO.:  [id number]          PATIENT TYPE:  INP   LOCATION:  9320                          [facility]ACILITY:  [facility]   PHYSICIAN:  Lesly Dukes, M.D. DATE OF BIRTH:  03/04/1970   DATE OF ADMISSION:  08/24/2008  DATE OF DISCHARGE:  09/01/2008                               DISCHARGE SUMMARY   ADMITTING DIAGNOSES:  1. Early intrauterine pregnancy.  2. Poorly controlled insulin-dependent diabetes mellitus.   DISCHARGE DIAGNOSES:  1. Early intrauterine pregnancy.  2. Poorly controlled insulin-dependent diabetes mellitus.   HOSPITAL COURSE:  [name], gravida 4, para  2-0-1-2, who presented in early pregnancy at approximately 5-3/7 weeks  with uncontrolled blood sugars.  Her initial ultrasound in maternity  admissions showed a 5-week 3-day sac with no fetal pole or cardiac  activity.  The patient was admitted to women's unit to establish control  of her blood sugars.  The patient's insulin regimens were changed with  different amounts of NPH and regular with a goal of achieving an  acceptable range.  She had social work and diabetic consult while she  was an inpatient.  Typically her blood sugars were in the 160s up to 250  initially and by the time of discharge her fasting range between 89 and  100 and her postprandials were in the 140s with some regimen of NPH 34  units in the morning and 20 units nightly and regular, insulin 20 units  before breakfast and 20 before dinner as well as metformin 500 mg twice  daily.  The patient had a followup ultrasound on August 31, 2008, which  showed pregnancy viability.  Crown-rump length showed 6 weeks and 2 days  with cardiac activity of 115 beats per minute and EDC to be April 24, 2009.  By September 01, 2008, the patient was feeling well.  She denied  pain or vaginal bleeding.  Her blood sugars had decreased to an  acceptable range.  The patient was  deemed to have received the full  benefit of her hospital stay.   Her inpatient laboratories showed 45 g of protein in a 24-hour urine  creatinine clearance.  Her creatinine to be 0.59 and creatinine  clearance 169.  TSH 1.629.  Slightly elevated liver enzymes.  SGOT 49,  SGPT 64 on comprehensive metabolic panel and hemoglobin E4V 9.2.   DISCHARGE INSTRUCTIONS:  The patient was strongly encouraged to follow  her insulin regimen and to check her blood sugars fasting and 2 hours  postprandial.  Discharge followup will occur at the [facility] at 8:00 a.m. on September 03, 2008, with the diabetic coordinator.   DISCHARGE MEDICATIONS:  1. NPH Insulin 34 units every a.m. and 20 units nightly.  2. Regular Insulin 22 units before breakfast and 22 units before      dinner.  3. Metformin 500 mg b.i.d.  4. Prenatal vitamin 1 p.o. daily.      Cam Hai, C.N.M.      Lesly Dukes, M.D.  Electronically Signed    KS/MEDQ  D:  09/01/2008  T:  09/02/2008  Job:  [id number] 

## 2010-05-20 NOTE — Group Therapy Note (Signed)
NAME:  Robin Klein, Robin Klein      ACCOUNT NO.:  [id number]   MEDICAL RECORD NO.:  [id number]          PATIENT TYPE:  WOC   LOCATION:  [facility]                   FACILITY:  [facility]   PHYSICIAN:  Argentina Donovan, MD        DATE OF BIRTH:  1970-06-02   DATE OF SERVICE:  04/05/2008                                  CLINIC NOTE   The patient is a 40 year old Spanish-speaking Hispanic female, gravida  3, para 2-0-1-2, who was seen in mid part of March in the Maternity  Admissions office with a failed pregnancy had occurred at 7 weeks.  When  seen, her beta had dropped from 12,000 to 6523.  She has been on  hydrochlorothiazide for blood pressure.   She was seen today.  Her blood pressure was normal at 126/83.  She was  156 pounds, 5 feet tall.  She was given Cytotec on her last visit 2  weeks ago.  Says she passed out everything.  The bleeding has stopped.  GYN had a Pap smear in 2 years.  On examination, external genitalia is  normal.  BUN is within normal limits.  Vagina is clean and well rugated.  Cervix is clean and parous.  Pap smear was taken.  Uterus, anterior,  normal size, shape, consistency.  Adnexa is normal.  Cul-de-sac is free.   PLAN:  To start the patient on Sprintec, her birth control, to check her  beta-hCG today.  If it is still elevated, to get an ultrasound and give  her call.  Otherwise says she will come back in a year for another Pap  smear.   IMPRESSION:  Complete abortion.           ______________________________  Argentina Donovan, MD     PR/MEDQ  D:  04/05/2008  T:  04/06/2008  Job:  [id number]

## 2010-05-23 NOTE — Discharge Summary (Signed)
NAME:  Robin Klein, Robin Klein                          ACCOUNT NO.:  [id number]   MEDICAL RECORD NO.:  [id number]                   PATIENT TYPE:  INP   LOCATION:                                       [facility]   PHYSICIAN:  Conni Elliot, M.D.             DATE OF BIRTH:  02/13/1970   DATE OF ADMISSION:  09/11/2003  DATE OF DISCHARGE:  09/17/2003                                 DISCHARGE SUMMARY   ADMISSION DIAGNOSES:  59.  A 40 year old, G2, P1-0-0-1 at six weeks' gestation with hyperglycemia.  2.  History of diabetes mellitus without current treatment.  3.  History of low transverse cesarean section secondary to non reassuring      fetal heart tracing.  4.  Elevated transaminases.   DISCHARGE DIAGNOSES:  90.  A 40 year old, G2, P1-0-0-1 at seven weeks' gestation.  2.  Diabetes mellitus, type 2.  3.  Resolved elevated transaminases.   DISCHARGE MEDICATIONS:  1.  NPH insulin 35 units subcutaneously q.a.m., 30 units subcutaneously q      h.s.  2.  Regular insulin 25 units subcutaneously q.a.m., 15 units subcutaneously      q dinner.  3.  Prenatal vitamin one daily.   HISTORY OF PRESENT ILLNESS:  Robin Klein was admitted at six weeks'  gestation with glucoses in the high 200's.  She was placed on NPH and  Regular insulin.  She was admitted for glycemic control.   Problem 1.  Diabetes mellitus.  Her hemoglobin A1c returned at 9.3.  Her  sugars were difficult to control for the first few days.  It was noted that  the patient was not strictly compliant with her hospital ADA Diet.  On day  of discharge, a 2200 calorie diet was discussed with the patient.  She was  given education on using her Glucometer and injecting insulin.  She is going  to followup in [facility] for follow up.   Problem 2.  Elevated transaminases.  Initially, her AST was 49 with an ALT  of 110.  These came down to 43 and 87, respectively.  She likely has a fatty  liver secondary to her diabetes  mellitus.   LABORATORY DATA:  A 24 hours urine was done for protein and creatinine.  The  protein was noted to be 113 mg.  A TSH was done which was normal at 1.599  and a __________ panel was also done which was within normal limits with a  uric acid of 3.7, LDH 109, platelets count of 269.  The transaminases were  noted above.  The patient will need a fetal echocardiogram at the  appropriate gestational age.   CONDITION ON DISCHARGE:  The patient was discharged to home in stable  condition.   DISCHARGE INSTRUCTIONS:  The patient was told of the above medical regimen.  She is to followup with the [facility].  Again,  she was given  instructions on diabetic diet and insulin administration.     Robin Klein, M.D.                     Conni Elliot, M.D.    LC/MEDQ  D:  09/17/2003  T:  09/17/2003  Job:  [id number]   cc:   [facility]
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
## 2010-05-23 NOTE — Discharge Summary (Signed)
[facility]  Patient:    Robin Klein, POPIEL Visit Number: [id number] MRN: [id number]          Service Type: OBS Location: 9300 [id number] Attending Physician:  Michaelle Copas Dictated by:   Ed Blalock. Burnadette Peter, M.D. Admit Date:  02/15/2001 Discharge Date: 02/19/2001                             Discharge Summary  HISTORY OF PRESENT ILLNESS:   This is a 39 year old, G1, P0, at 36 weeks 4 days, who was noted to have increased blood pressure. She denied contractions, ruptured membranes, vaginal bleeding. She reports positive fetal movement. The patient was in for NST and found to have an elevated blood pressure; denied headaches, scotoma, dizziness, blurred vision, epigastric pain or any other PIH-type symptoms. _______ care High Risk Clinic.  MEDICATIONS:                  Prenatal vitamins.  ALLERGIES:                    No known drug allergies.  OBSTETRIC HISTORY:            None.  GYNECOLOGIC HISTORY:          None.  MEDICAL HISTORY:              None.  SURGICAL HISTORY:             None.  FAMILY AND SOCIAL HISTORY:    Denies tobacco, alcohol, illicit drug use.  PRENATAL LABORATORY DATA:     Prenatal labs were done and were normal.  OBJECTIVE:  VITAL SIGNS:                  Blood pressure was 154/104, initially, and then 130/80s subsequent blood pressure.  EXTREMITIES:                  No edema.  NEUROLOGIC:                   2+ DTRs. No clonus.  HEENT:                        Pupils equal, round, reactive to light and accommodation.  PELVIC:                       Speculum exam not done. Digital cervical exam was 1, long, and -3.  ELECTRONIC FETAL MONITOR:     Fetal heart rate baseline 120, reactive. There were no decelerations.  LABORATORY DATA:              H&H was 11.6 and 34.5, white count 7.8, platelets 280,000. UA showed no protein. Uric acid was 4.0. LFTs were normal.  Ultrasound showed an AFI of 12.5 with an anterior  placenta and fetal growth at 22nd percentile.  HOSPITAL COURSE:              Throughout the hospitalization, the patients blood pressures stabilized in the normal range. A 24-hour urine was done and showed creatinine clearance of 119 and urine protein at less than 6 mg/dl. The patient had a benign hospital course and it was decided that she could be discharged home with continued antenatal testing biweekly followup.  On hospital day #3 she was discharged home.  DISCHARGE ACTIVITY:  Bed rest.  DISCHARGE DIET:               ADA 2200 calorie diet with snacks.  DISCHARGE MEDICATIONS:        None.  DISCHARGE FOLLOWUP:           The patient is to be seen in the High Risk Clinic on February 09, 2001.  DISCHARGE CONDITION:          Improved.  DISCHARGE DIAGNOSES:          1. Type 2 diabetes mellitus, _______                                  requiring at this time.                               2. Pregnancy induced hypertension versus                                  preeclampsia.                               3. Intrauterine pregnancy at 37 weeks 1 day. Dictated by:   Ed Blalock. Burnadette Peter, M.D. Attending Physician:  Michaelle Copas DD:  02/23/01 TD:  02/23/01 Job: [id number]

## 2010-05-23 NOTE — Discharge Summary (Signed)
[facility]  Patient:    Robin Klein, Robin Klein Visit Number: [id number] MRN: [id number]          Service Type: Attending:  Conni Elliot, M.D. Dictated by:   Marlinda Mike, CNM Adm. Date:  02/15/01 Disc. Date: 02/19/01                             Discharge Summary  DATE OF BIRTH:                10/28/1970  ADMISSION DIAGNOSES:          1. Intrauterine pregnancy at 37-6/7 weeks.                               2. Spontaneous rupture of membranes.                               3. Elevated blood pressure.                               4. Insulin-dependent diabetes mellitus.  DISCHARGE DIAGNOSES:          1. Postoperative day #3 from primary                                  low transverse cesarean section.                               2. Resolving preeclampsia.                               3. Insulin-dependent diabetes mellitus.                               4. Acute blood loss anemia.                               5. Breast feeding.  HISTORY OF PRESENT ILLNESS:   The patient is a 40 year old, gravida 1, para 0, with a due date of March 30, 2001.  ALLERGIES:                    No known drug allergies.  CURRENT MEDICATIONS:          NPH insulin 5 units in the morning and NPH insulin 10 units at bedtime.  PAST MEDICAL HISTORY:         Significant for diabetes mellitus, oral agent treatment prior to pregnancy.  PAST SURGICAL HISTORY:        None.  GYNECOLOGIC HISTORY:          None.  SOCIAL HISTORY:               No history of substance abuse.  The patient is Hispanic with primary language being Spanish.  PRENATAL LABORATORY DATA:     The patient is 0 positive, antibody screen negative.   Baseline platelet count 287.  Rubella immune, hepatitis negative, syphilis screen negative, HIV negative, gonorrhea and chlamydia screens  negative.  Group B strep negative on December 22, 2000.  Last hemoglobin A1C in August was 5.5.  The patient received  prenatal care at the high risk maternity clinic at [facility] factors including diabetes, language barrier, and preeclampsia.  HOSPITAL COURSE:              The patient presented to maternity admission on February 11 with contractions and a watery discharge with confirmation of spontaneous rupture of membranes.  Exam revealed temperature 97.1, pulse 60, respirations 20, blood pressure 184/98, 153/129, 173/103.  The patients reflexes were 2+ with no clonus, no edema noted.  Fetal heart rate tracing was 120s to 125 with decreased variability.  No decelerations noted during initial evaluation with some accelerations, occasional uterine contraction noted.  The cervix was 1 cm, 80%, vertex, -2 station.  Initial lab work revealed hemoglobin 13.3, hematocrit 39.2, platelet count 229.  Creatinine 0.7, AST 17, ALT 5.  LDH 152.  Uric acid 5.3.  Also noted, the patient had greater than 300 protein on urine exam.  The patient was admitted to labor and delivery upon which hospital course fetal heart rate tracing revealed repetitive fetal heart rate decelerations.  The patient was taken to surgery and underwent primary low transverse cesarean section, was delivered of a viable female with Apgar scores of 8 at 1 minute and 9 at 5 minutes.  The infant was evaluated by the neonatologist with suspicious exam for Downs syndrome.  The infant was subsequently transferred to [facility] for further evaluation prior to patients discharge from the hospital.  The patient was placed on magnesium sulfate related to preeclampsia.  Blood sugars remained stable.  The patient was treated with a sliding scale insulin postoperatively.  On postop day #1, hemoglobin was 9.8, hematocrit 28.3, platelet count 84.  Creatinine 0.7, AST 80, ALT 99.  LDH was 538, and uric acid 5.5  The patient continued on course of magnesium sulfate throughout postoperative day #3 on February 19, 2001.   Labs on day of  discharge: Platelet count stable, resolving, with a value of 139.  Hemoglobin 8.5, hematocrit 25, white blood cell count 7.1.  AST 30, ALT 58, LDH 228, and a uric acid 5.1.  Fasting blood sugars 85 on day of discharge with postprandial blood sugars of 110 to 149.  The patient was discharged on postoperative day #3 in stable condition.  The patient is to return to maternity admissions on day #5 for staple removal and incision examination.  Also, the patient is to continue on ADA diet at home with blood sugar monitoring until evaluation in maternity admissions at which time a followup appointment will be made at maternity admission to [facility] for management of diabetes.  The patient is also to see lactation during visit to maternity admissions.  The patient is manually pumping and breast milk storage secondary to infant being transferred to [facility].  The patient has acute blood loss anemia and was started on prenatal vitamins once daily, iron tablets twice daily at 325 mg.  Pain medicine is Darvocet-N 100 as needed for pain.  The patient is discharged in stable condition. Dictated by:   Marlinda Mike, CNM Attending:  Conni Elliot, M.D. DD:  04/08/01 TD:  04/09/01 Job: [id number]

## 2010-05-23 NOTE — Discharge Summary (Signed)
[facility]  Patient:    Robin Klein, Robin Klein Visit Number: [id number] MRN: [id number]          Service Type: Attending:  Conni Elliot, M.D. Dictated by:   Marlinda Mike, CNM Adm. Date:  02/09/01 Disc. Date: 02/11/01                             Discharge Summary  DATE OF BIRTH:                [birth year]  ADMISSION DIAGNOSES:          1. Intrauterine pregnancy at 37-0/7 weeks.                               2. Insulin-dependent diabetes mellitus.                               3. Rule out preeclampsia.  DISCHARGE DIAGNOSES:          1. Intrauterine pregnancy at 37-2/7 weeks.                               2. Insulin-dependent diabetes mellitus.                               3. Stable condition with good glycemic control.                               4. Pregnancy-induced hypertension with stable                                  blood pressure and laboratory values.  HISTORY OF PRESENT ILLNESS:   The patient is a 40 year old gravida 1, para 0, at [redacted] weeks gestation based on last menstrual period dating with an EDD of February 22, 2001, which correlated with a 10-week ultrasound and EDD of February 25, 2001.  The patient presented to maternity admissions with contractions and increased blood pressure for evaluation.  Initial evaluation revealed blood pressure of 130/95, 130/103, 130/78.  Cervix was long, thick, and closed.  Fetal heart rate tracings were reactive from a baseline of 120 to 130 with occasional contractions.  The patient is followed at high-risk clinic due to risk factors of insulin-requiring diabetic condition.  She is O positive, antibody screen negative, rubella immune, hepatitis negative, HIV nonreactive, Group B strep negative.  ALLERGIES:                    No known drug allergies.  CURRENT MEDICATIONS:          NPH insulin 5 units every morning, 10 units at bedtime every night.  PAST MEDICAL HISTORY:         Significant  for diabetes, type 2.  Prior to pregnancy, the patient was treated with Glucophage oral agent to control diabetes, changed over to insulin during course of pregnancy for better glycemic control, potential risk with oral agents during pregnancy.  SOCIAL HISTORY:               The patient has a mild language barrier.  primary language is [place].  Communication facilitated by a Nurse, learning disability.  PAST SURGICAL HISTORY:        None.  GYNECOLOGIC HISTORY:          None significant.  HOSPITAL COURSE:              The patient was admitted on February 09, 2001, for evaluation of elevated blood pressure.  Serial blood pressure taken during hospital course: 130/90, 130/80 baseline.  The patient underwent complete ultrasound primarily for amniotic fluid index and biophysical profiling. Ultrasound revealed a vertex presentation of a single intrauterine pregnancy. Placenta anterior and grade 2.  Amniotic fluid index within normal limits at 11.1 cm.  No parameters were taken during this ultrasound examination. Biophysical profile revealed 8/8 in less than 30 minutes of scanning.  Fetal heart rate tracing during hospitalization remained reactive with occasional uterine contraction inconsistent with labor.  On February, CBC revealed hemoglobin 11.9, hematocrit 35.2, white blood cell count 7.0, platelet count 238.  Creatinine 0.7, AST 11, ALT 8 on February 10, 2001.  Labs all remained stable with platelet count 216.  AST 11, ALT 8.  Uric acid remained the same at 4.6.  LDH on admission was 107, and at discharge 93. All labs stable.  The patient completed 24-hour urine during hospital course.  Those results were not available at time of discharge.  The patient was discharged.  The patient was discharged on February 11, 2001, in stable condition.  Blood pressure and lab work all remained stable with good glycemic control on NPH insulin during hospitalization.  Heart rate tracing was reassuring.  The patient  was discharged home in care of family to follow up at the high-risk maternity clinic Wednesday morning as scheduled.  The patient was to continue bed rest at home primarily in lateral position.  Labor symptoms were reviewed with the patient.  The patient is to return to hospital with increased labor symptoms, specifically contractions 5 minutes apart, leaking of fluid, or vaginal bleeding.  The patient is to continue fetal kick times at home, report any decrease in fetal movement.  Furthermore, pregnancy-induced hypertension precautions were reviewed.  The patient is to return with any severe headache, vision changes, upper abdominal epigastric pain.  No medication changes were made during hospitalization.  DISCHARGE MEDICATIONS: 1. Prenatal vitamin 1 daily. 2. Same insulin doses as at admission, NPH insulin 5 units in the    morning, 10 units at bedtime. Dictated by:   Marlinda Mike, CNM Attending:  Conni Elliot, M.D. DD:  04/08/01 TD:  04/09/01 Job: [id number]

## 2010-05-23 NOTE — Op Note (Signed)
[facility]  Patient:    Robin Klein, Robin Klein Visit Number: [id number] MRN: [id number]          Service Type: OBS Location: 9400 [id number] Attending Physician:  Michaelle Copas Dictated by:   Conni Elliot, M.D. Proc. Date: 02/16/01 Admit Date:  02/15/2001                             Operative Report  PREOPERATIVE DIAGNOSES:       Insulin-dependent diabetes mellitus with severepreeclampsia and issues of growth restriction with repetitive fetal heart rate late decelerations.  POSTOPERATIVE DIAGNOSES:      Insulin-dependent diabetes mellitus with severe preeclampsia and issues of growth restriction with repetitive fetal heart rate late decelerations.  PROCEDURE:                    Low transverse cesarean.  SURGEON:                      Conni Elliot, M.D.  ANESTHESIA:                   Continuous lumbar epidural.  OPERATIVE FINDINGS:           Female infant with Apgars of 8/9, cord pH of 7.26.  Placenta was sent to pathology.  DESCRIPTION OF PROCEDURE:     After bringing continuous lumbar epidural anesthetic to an operative level, with patient supine, in left-lateral-tilt position and receiving oxygen, abdomen was prepped and draped in sterile fashion. A low transverse Pfannenstiel incision was made.  Incision was made through skin to fascia, rectus muscle separated in the midline, peritoneal cavity entered, bladder flap created and low transverse uterine incision was made.  Amniotic fluid was still clear.  Baby was delivered in vertex presentation, cord double-clamped and cut and baby handed to neonatologist in attendance.  Placenta was delivered spontaneously.  Uterus, bladder flap, anterior peritoneum, fascia, subcutaneous tissue and skin were closed in routine fashion.  Estimated blood loss was less than 800 cc.  Needle, instrument and sponge count correct. Dictated by:   Conni Elliot, M.D. Attending Physician:  Michaelle Copas DD:  02/16/01 TD:  02/16/01 Job: [id number]

## 2010-05-23 NOTE — Discharge Summary (Signed)
[facility]  Patient:    Robin Klein, Robin Klein Visit Number: [id number] MRN: [id number]          Service Type: OBS Location: 9300 [id number] Attending Physician:  Tammi Sou Dictated by:   Kevin Fenton, M.D. Adm. Date:  08/25/2000 Disc. Date: 08/30/2000                             Discharge Summary  DATE OF BIRTH:                [birth year].  DISCHARGE MEDICATIONS:        1. Reglan 10 mg p.o. q.a.c. and q.h.s.                               2. Phenergan suppositories 25 mg every six                                  hours as needed.                               3. Insulin NPH 10 units before bed and                                  7 units in the morning.                               4. Verapamil 4 mg p.o. q.d.  DISCHARGE INSTRUCTIONS:       She is to follow a 2000 calorie ADA diet.  DISCHARGE FOLLOWUP:           The patient has an appointment at [facility] on Wednesday, August 28 at 10:10 a.m. She will be followed up by [facility] Tuesday, August 24, and Friday, August 30, then Monday and Fridays after that.  DISCHARGE DIAGNOSES:          1. Intrauterine pregnancy at 13 weeks.                               2. Hyperemesis gravidarum.                               3. Insulin-dependent diabetes mellitus.                               4. Dehydration.                               5. Hypokalemia, now resolved.                               6. Migraines.  BRIEF HOSPITAL COURSE:        This is a 40 year old, G1, with an EDC of March 02, 2001 who presents with hyperemesis and an approximately 10-pound weight loss in one week. She has been vomiting daily and  unable to tolerate any food or liquids for the last three or four days prior to admission. She was admitted for dehydration and potassium level of 2.7. The patient was placed on IV fluids, IV Phenergan, IV multivitamins, IV thiamine, and was given p.o. potassium  replacement. The patient was slowly put back on p.o. foods as well as started on Reglan which patient began to tolerate very well. At discharge, the patient is tolerating a normal diet off IV fluids with Reglan before meals. She is using the IV Phenergan on an as-needed basis. She has been seen by a nutritionist. Her prealbumin visit was 14; previous visit her prealbumin was 24. The patient is discharged home on the above medications. She is being discharged on the Verapamil for migraine prophylaxis. Dictated by:   Kevin Fenton, M.D. Attending Physician:  Tammi Sou DD:  08/30/00 TD:  08/30/00 Job: [id number]

## 2010-05-23 NOTE — Discharge Summary (Signed)
[facility]  Patient:    Robin Klein, PEDERSON                       MRN: [id number] Adm. Date:  [id number] Disc. Date: 08/14/00 Attending:  Tammi Sou Dictator:   Ernesto Rutherford, M.D.                           Discharge Summary  DATE OF BIRTH:                [birth year]  DISCHARGE DIAGNOSES:          1. Hyperemesis gravidarum.                               2. Diabetes mellitus type 2.  DISCHARGE MEDICATIONS:        1. Phenergan 25 mg p.o. q.6h. x 1 week, then                                  p.r.n.                               2. Prenatal vitamins.  DIET:                         The patient is to follow a diabetic diet.  This was gone over with the patient by nutrition while in the hospital.  FOLLOW-UP:                    The patient is to call the high-risk clinic on Monday and be seen within one week.  HISTORY OF PRESENT ILLNESS:   This is a 40 year old G1, P0, who presented to [facility] at 11 weeks 6 days by last menstrual period with excessive vomiting for the past 20 days.  The patient states she had been vomiting multiple times, unable to keep down anything orally.  Did have a 10-pound weight loss over the past 20 days.  She had previously been seen at [facility] for hyperemesis and had been discharged home on Reglan at the beginning of this month.  She was seen at the high-risk clinic today and sent here for IV rehydration and to get her vomiting under control.  PAST MEDICAL HISTORY:         The patient has a history of non-insulin-dependent diabetes for the past three years, for which she had been taking Glucophage prior to this pregnancy.  FAMILY HISTORY:               Noncontributory.  PHYSICAL EXAMINATION:  VITAL SIGNS:                  On presentation the patient was afebrile at 99, blood pressure 110/72, pulse of 80.  ABDOMEN:                      Soft, gravid uterus.  Positive bowel sounds.  ASSESSMENT:                    This was an 11-week 6-day intrauterine pregnancy presenting with hyperemesis, dehydration, and non-insulin-dependent diabetes. She was admitted for management of these issues.  HOSPITAL  COURSE: #1 - HYPEREMESIS:  The patient was placed on IV lactated Ringers at 125 cc/hr with Phenergan and multivitamin added to her fluids.  The patient responded very well to this therapy, and by 24 hours posthospitalization was eating normally and taking fluids normally.  Therefore, on day #3 of hospitalization, the patient was taken off the IV fluids and the Phenergan, tried on oral Phenergan dose which she responded well to and was able to hold down a meal.  Therefore, she was discharged on the medications listed above, in stable condition.  The patient did have a prealbumin to assess her nutrition level while here in the hospital, and she was found to have a prealbumin of 24, which was normal.  Nutrition did counsel the patient on proper eating habits during pregnancy, given her hyperemesis and diabetes.  #2 - DIABETES MELLITUS:  The patient has been off of her Glucophage during this pregnancy and has been doing quite well off of it.  The patient checks her own blood sugars at home and had been running in the range of 100-110, and this was, basically, the range she followed while she was here in the hospital.  Therefore, she was not instituted on any insulin therapy.  The patient was instructed to check her serum blood sugars four times a day while at home, once before breakfast and then two hours postprandial and once before bedtime.  She is to bring these values to her follow-up at high-risk clinic to assess whether she might need insulin therapy at some point later in the pregnancy.  DISCHARGE LABORATORY DATA:    The patient did have a TSH of 1.44.  Urinalysis was negative except for 100 glucose and 40 ketones.  A Chem-7 was grossly normal.  Sodium was normal.  Hemoglobin 14.2, white  count 8.6, platelets of 285. DD:  08/14/00 TD:  08/15/00 Job: [id number]

## 2010-05-23 NOTE — Discharge Summary (Signed)
[facility]  Patient:    Robin Klein, Robin Klein                       MRN: [id number] Adm. Date:  [id number] Disc. Date: [id number] Attending:  Tammi Sou Dictator:   Emelda Fear, M.D. CC:         High Risk [facility]   Discharge Summary  DATE OF BIRTH:                29-Jul-1970.  ADMISSION DIAGNOSIS:          Hyperemesis.  DISCHARGE DIAGNOSIS:          Hyperemesis.  SERVICE:                      The patient was admitted to the Obstetric Teaching Service.  ATTENDING:                    Bing Neighbors. Clearance Coots, M.D.  HOSPITAL COURSE:              Robin Klein is a 40 year old, G1, P0, female presenting at 10 weeks of pregnancy with chief complaint of nausea and vomiting for the past two weeks. The patient had been evaluated at [facility] approximately one week ago for similar complaints. The patient was also complaining of mild lower abdominal pain for the past two days. At time of admission, the patient denied any vaginal bleeding or dysuria or fever. Upon presentation to [facility], a urinalysis was performed that revealed amber colored urine that was clear. Specific gravity of 1.020, pH of 6.5, positive 40+ ketones, urobilinogen was 1.0, otherwise negative at that time. HCG was positive upon presentation. The patient was admitted for rehydration and control of her nausea and vomiting. The patient was started on Phenergan p.r.n. for vomiting. The patient tolerated hydration. By hospital day #2 was tolerating dinners without any complications. Of note, also during admission, the patient received a complete OB ultrasound that revealed a single living intrauterine gestation with a mean gestational age of [redacted] weeks 1 day that was consistent with dates. This ultrasound showed a crown rump length of 13.8 mm, consistent with dates. The fetal heart rate was regular and 175 beats per minute at the time. No more yolk sac detected.  ______ appropriate. No subchorionic hemorrhage. No free pelvic fluid or adnexal masses.  Upon day of discharge, the patient discussed at length unique management of her diabetes during pregnancy. Since the patient had been established at an [facility] facility for her current pregnancy, we recommended that the patient be connected with the High Risk Clinic at this time. The patient agreed and is being seen at the High Risk Clinic and an appointment was made at that time.  OTHER LABORATORIES DURING ADMISSION:             During admission the patients CBGs ran from a range of 65 to 165 without requirement for insulin management. pH of her blood was 7.478, PC)2 32.3, bicarb was 24 and base excess was 2.0. Hemoglobin was 14.0, hematocrit 40.0, sodium of 140, potassium of 3.8, chloride a 106, glucose was 110 on this test, BUN was 6.  DISCHARGE CONDITION:          Good.  DISCHARGE FOLLOWUP:           The patient is to follow up next Wednesday, August 7 at 8:30 to be established as a patient  at the High Risk Clinic. Of note, this patient is Spanish-speaking only and will see Veryl Speak and Patti at that visit.  DISPOSITION:                  The patient was discharged home.  DISCHARGE MEDICATIONS:        1. Reglan 10 mg p.o. q.6h. p.r.n. for nausea.                               2. Prenatal vitamins one tablet every day for                                  the next year.  DISCHARGE INSTRUCTIONS:       1. Activity: Normal restrictions, no                                  restrictions.                               2. Diet: Normal diet.                               3.  WDD:  08/06/00 TD:  08/08/00 Job: [id number]

## 2010-06-16 ENCOUNTER — Encounter: Payer: Self-pay | Admitting: Internal Medicine

## 2010-06-24 ENCOUNTER — Emergency Department (HOSPITAL_COMMUNITY): Payer: Self-pay

## 2010-06-24 ENCOUNTER — Emergency Department (HOSPITAL_COMMUNITY)
Admission: EM | Admit: 2010-06-24 | Discharge: 2010-06-24 | Disposition: A | Discharge status: Home / Self Care | Payer: Self-pay | Attending: Emergency Medicine | Admitting: Emergency Medicine

## 2010-06-24 DIAGNOSIS — Z794 Long term (current) use of insulin: Secondary | ICD-10-CM | POA: Insufficient documentation

## 2010-06-24 DIAGNOSIS — B9789 Other viral agents as the cause of diseases classified elsewhere: Secondary | ICD-10-CM | POA: Insufficient documentation

## 2010-06-24 DIAGNOSIS — R112 Nausea with vomiting, unspecified: Secondary | ICD-10-CM | POA: Insufficient documentation

## 2010-06-24 DIAGNOSIS — H6120 Impacted cerumen, unspecified ear: Secondary | ICD-10-CM | POA: Insufficient documentation

## 2010-06-24 DIAGNOSIS — H9209 Otalgia, unspecified ear: Secondary | ICD-10-CM | POA: Insufficient documentation

## 2010-06-24 DIAGNOSIS — R1013 Epigastric pain: Secondary | ICD-10-CM | POA: Insufficient documentation

## 2010-06-24 DIAGNOSIS — I10 Essential (primary) hypertension: Secondary | ICD-10-CM | POA: Insufficient documentation

## 2010-06-24 DIAGNOSIS — R1011 Right upper quadrant pain: Secondary | ICD-10-CM | POA: Insufficient documentation

## 2010-06-24 DIAGNOSIS — E860 Dehydration: Secondary | ICD-10-CM | POA: Insufficient documentation

## 2010-06-24 DIAGNOSIS — E119 Type 2 diabetes mellitus without complications: Secondary | ICD-10-CM | POA: Insufficient documentation

## 2010-06-24 LAB — URINALYSIS, ROUTINE W REFLEX MICROSCOPIC
Glucose, UA: 500 mg/dL — AB
Hgb urine dipstick: NEGATIVE
Ketones, ur: 15 mg/dL — AB
pH: 7 (ref 5.0–8.0)

## 2010-06-24 LAB — BASIC METABOLIC PANEL
BUN: 12 mg/dL (ref 6–23)
Creatinine, Ser: 0.53 mg/dL (ref 0.50–1.10)
GFR calc non Af Amer: >60 mL/min (ref >60)
Glucose, Bld: 224 mg/dL — ABNORMAL HIGH (ref 70–99)
Potassium: 3.7 mEq/L (ref 3.5–5.1)

## 2010-06-24 LAB — DIFFERENTIAL
Basophils Relative: 0 % (ref 0–1)
Eosinophils Absolute: 0.3 10*3/uL (ref 0.0–0.7)
Eosinophils Relative: 3 % (ref 0–5)
Lymphs Abs: 3.4 10*3/uL (ref 0.7–4.0)
Neutrophils Relative %: 48 % (ref 43–77)

## 2010-06-24 LAB — CBC
HCT: 34.4 % — ABNORMAL LOW (ref 36.0–46.0)
Hemoglobin: 12.5 g/dL (ref 12.0–15.0)
MCH: 30.8 pg (ref 26.0–34.0)
MCHC: 36.3 g/dL — ABNORMAL HIGH (ref 30.0–36.0)
MCV: 84.7 fL (ref 78.0–100.0)
RDW: 12.3 % (ref 11.5–15.5)

## 2010-06-24 LAB — URINE MICROSCOPIC-ADD ON

## 2010-06-24 LAB — HEPATIC FUNCTION PANEL
Bilirubin, Direct: <0.1 mg/dL (ref 0.0–0.3)
Total Protein: 7.2 g/dL (ref 6.0–8.3)

## 2010-07-10 ENCOUNTER — Ambulatory Visit (INDEPENDENT_AMBULATORY_CARE_PROVIDER_SITE_OTHER): Payer: Self-pay | Admitting: Internal Medicine

## 2010-07-10 VITALS — BP 110/79 | HR 82 | Temp 98.1°F | Ht 60.5 in | Wt 157.6 lb

## 2010-07-10 DIAGNOSIS — R52 Pain, unspecified: Secondary | ICD-10-CM

## 2010-07-10 DIAGNOSIS — Z9149 Other personal history of psychological trauma, not elsewhere classified: Secondary | ICD-10-CM

## 2010-07-10 DIAGNOSIS — E785 Hyperlipidemia, unspecified: Secondary | ICD-10-CM

## 2010-07-10 DIAGNOSIS — IMO0002 Reserved for concepts with insufficient information to code with codable children: Secondary | ICD-10-CM

## 2010-07-10 DIAGNOSIS — E119 Type 2 diabetes mellitus without complications: Secondary | ICD-10-CM

## 2010-07-10 DIAGNOSIS — G4700 Insomnia, unspecified: Secondary | ICD-10-CM | POA: Insufficient documentation

## 2010-07-10 LAB — GLUCOSE, CAPILLARY: Glucose-Capillary: 197 mg/dL — ABNORMAL HIGH (ref 70–99)

## 2010-07-10 MED ORDER — METFORMIN HCL 1000 MG PO TABS: 1000.0000 mg | ORAL_TABLET | Freq: Two times a day (BID) | ORAL | Status: DC

## 2010-07-10 MED ORDER — LISINOPRIL-HYDROCHLOROTHIAZIDE 20-25 MG PO TABS: 1.0000 | ORAL_TABLET | Freq: Every day | ORAL | Status: DC

## 2010-07-10 MED ORDER — PROMETHAZINE HCL 25 MG PO TABS: 25.0000 mg | ORAL_TABLET | Freq: Every evening | ORAL | Status: AC | PRN
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
## 2010-07-11 ENCOUNTER — Encounter: Payer: Self-pay | Admitting: Internal Medicine

## 2010-07-11 NOTE — Assessment & Plan Note (Signed)
Pt unable to fall into sleep and stated that she wakes up very early. - Phenergan (pt preference) given to pt -stress management discussed
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
## 2010-07-11 NOTE — Assessment & Plan Note (Signed)
Pt brought her meter with BG ranging 190-210 before breakfast. she has been under a lot of stress lately  - instructed pt to continue the current regimen  - encourage check her BG twice a day -will check her HBA1C and discuss with her during next visit -she also follows up with Jamison Neighbor for DM management
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
## 2010-07-19 NOTE — Progress Notes (Signed)
Patient seen and examined.  I agree with assessment and plan as per Dr. Li. 

## 2010-07-24 ENCOUNTER — Ambulatory Visit (INDEPENDENT_AMBULATORY_CARE_PROVIDER_SITE_OTHER): Payer: Self-pay | Admitting: Dietician

## 2010-07-24 ENCOUNTER — Ambulatory Visit (INDEPENDENT_AMBULATORY_CARE_PROVIDER_SITE_OTHER): Payer: Self-pay | Admitting: Internal Medicine

## 2010-07-24 ENCOUNTER — Encounter: Payer: Self-pay | Admitting: Internal Medicine

## 2010-07-24 ENCOUNTER — Encounter: Payer: Self-pay | Admitting: Licensed Clinical Social Worker

## 2010-07-24 ENCOUNTER — Ambulatory Visit: Payer: Self-pay | Admitting: Licensed Clinical Social Worker

## 2010-07-24 ENCOUNTER — Telehealth: Payer: Self-pay | Admitting: Licensed Clinical Social Worker

## 2010-07-24 ENCOUNTER — Other Ambulatory Visit: Payer: Self-pay | Admitting: Internal Medicine

## 2010-07-24 DIAGNOSIS — E119 Type 2 diabetes mellitus without complications: Secondary | ICD-10-CM

## 2010-07-24 DIAGNOSIS — M545 Low back pain, unspecified: Secondary | ICD-10-CM

## 2010-07-24 DIAGNOSIS — IMO0002 Reserved for concepts with insufficient information to code with codable children: Secondary | ICD-10-CM

## 2010-07-24 DIAGNOSIS — E785 Hyperlipidemia, unspecified: Secondary | ICD-10-CM

## 2010-07-24 DIAGNOSIS — I10 Essential (primary) hypertension: Secondary | ICD-10-CM

## 2010-07-24 DIAGNOSIS — Z9149 Other personal history of psychological trauma, not elsewhere classified: Secondary | ICD-10-CM

## 2010-07-24 DIAGNOSIS — G4700 Insomnia, unspecified: Secondary | ICD-10-CM

## 2010-07-24 LAB — GLUCOSE, CAPILLARY: Glucose-Capillary: 108 mg/dL — ABNORMAL HIGH (ref 70–99)

## 2010-07-24 NOTE — Assessment & Plan Note (Signed)
She reports that she is able to sleep well with the help of phenergan. - will continue the current regimen. - She may not need this med in the future if her life stressors can be well controlled with the help from social worker and her family (husband).

## 2010-07-24 NOTE — Telephone Encounter (Signed)
Called Fam. Services and made referral on behalf of patient.
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
## 2010-07-24 NOTE — Assessment & Plan Note (Addendum)
Intermittent lower back pain worsening with standing and rest will alleviate it completely. No in pain during this visit. Negative assessment. Leg rasing tests NEGATIVE. I think that her back pain is muscular related. - encourage her to loss weight and strength the core muscle. -education on proper body mechanics.

## 2010-07-24 NOTE — Patient Instructions (Addendum)
1. Continue to check your blood sugar twice a day and bring them to the clinic. 2. Follow up with Dorothe Pea  3. Follow up with Jamison Neighbor as scheduled. 4. Will check your lipid level this visit. 5. Follow up with the clinic in 1 month. 6. I will check urine microalbumin /cr ratio for next visit ( The order has been put in)

## 2010-07-24 NOTE — Progress Notes (Signed)
Diabetes Self-Management Training (DSMT)  Follow-Up 2 Visit  07/24/2010 [name], identified by name and date of birth, is a 40 y.o. female with Type 2 Diabetes. Year of diabetes diagnosis:  Other persons present: family member patient, son and daughter and interpreter  ASSESSMENT Patient concerns are Glycemic control and Weight control.  There were no vitals taken for this visit. There is no height or weight on file to calculate BMI. Lab Results  Component Value Date   LDLCALC 62 07/15/2009   Lab Results  Component Value Date   HGBA1C 7.8 07/10/2010   Medication Nutrition Monitor: off brand we cannot download- patient keeps logbook  Labs reviewed.  DIABETES BUNDLE: A1C in past 6 months? Yes.  Less than 7%? No LDL in past year? Yes.  Less than 100 mg/dL? Yes Microalbumin ratio in past year? No. Patient taking ACE or ARB? Yes. Blood pressure less than 130/80? Yes. Foot exam in last year? Yes. Eye exam in past year? Yes. Tobacco use? No. Pneumovax? Yes Flu vaccine? Yes Asprin? No  Family history of diabetes: Yes  Support systems: spouse  Special needs: spanish speaking  Prior DM Education: Yes   Medications See Medications list.  Has adequate knowledge  Patients belief/attitude about diabetes: Diabetes can be controlled.  Self foot exams daily: Yes  Diabetes Complications: None   Exercise Plan Doing walking for 60 minutesa day.   Self-Monitoring Frequency of testing: 1-2 times/day Breakfast: 130-150 Supper: 120-200   Hyperglycemia: No Hypoglycemia: No   Meal Planning Some knowledge   Assessment comments: forgot why carbs are important. Reviewed this today. Weight decreased by 7 # since February 2012. Patient pedometer battery died. Replaced her pedometer today.     INDIVIDUAL DIABETES EDUCATION PLAN:  Nutrition management _______________________________________________________________________  Intervention TOPICS COVERED TODAY:    Nutrition management  Role of diet in the treatment of diabetes and the relationship between the three main macronutritents and blood glucose control. Food label reading, portion sizes and measuring food.  PATIENTS GOALS/PLAN (copy and paste in patient instructions so patient receives a copy): 1.  Learning Objective:       Know why carbohydrate and fat portion control is important when your have diabetes 2.  Behavioral Objective:         Nutrition: To improve blood glucose control I will follow meal plan of 2-3 carb portions at meals nd up to 1 portion for snacks Half of the time 50% Physical Activity: For improved blood glucose control and decrease insulin resistance, I will exercise 1 hour 5 days a week   Most of the time 75%  Personalized Follow-Up Plan for Ongoing Self Management Support:  Doctor's Office, family and CDE visits ______________________________________________________________________   Outcomes Expected outcomes: Demonstrated interest in learning.Expect positive changes in lifestyle.  Self-care Barriers: English as second language, Lack of material resources  Education material provided: Portion control of carb and fats in simple spanish handouts  Patient to contact team via Phone if problems or questions.  Time in: 1030     Time out: 1100  Future DSMT - 4-6 wks   Klein,Robin

## 2010-07-24 NOTE — Assessment & Plan Note (Signed)
Pt feels much better. She states that her husband has been very helpful and she has no acute     She reports that she has contacted Robin Klein for SW consult.  - encourage her to continue to f/u with Robin Klein

## 2010-07-24 NOTE — Assessment & Plan Note (Signed)
H/o elevated TG. Not on any medication to control lipid. - will recheck lipid panel today.

## 2010-07-24 NOTE — Assessment & Plan Note (Signed)
Sates that she feels much better, more energetic, and relaxed. BGM 108 (fasting ) this am, and her BG ranges from 120s-180's for past 2 weeks. HbA1C 7.8 on 07/10/10. Pt state that her husband has been very supportive,and she has more time to take care of herself. She is very interested and motivated on diet control and weight loss to further better managing her DM. Pt also reports that she has been very complaint with her medications - will continue the current regimen. - encourage her to continue the lifestyle changes. - encourage her continue to f/u with Mitchell Heir for DM management. Pt has an appointment with her today. - will check her urine microalbumin/cr ratio in next visit.( I have already put the order in)

## 2010-07-24 NOTE — Assessment & Plan Note (Signed)
BP well controlled -will continue the current regimen.
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
## 2010-07-25 NOTE — Progress Notes (Signed)
Precepted this visit with Dr. Dierdre Searles.  Agree with management and note.

## 2010-08-05 ENCOUNTER — Encounter: Payer: Self-pay | Admitting: Internal Medicine

## 2010-08-22 ENCOUNTER — Ambulatory Visit: Payer: Self-pay | Admitting: Dietician

## 2010-08-22 ENCOUNTER — Encounter: Payer: Self-pay | Admitting: Internal Medicine

## 2010-08-28 ENCOUNTER — Ambulatory Visit (INDEPENDENT_AMBULATORY_CARE_PROVIDER_SITE_OTHER): Payer: Self-pay | Admitting: Dietician

## 2010-08-28 ENCOUNTER — Ambulatory Visit (INDEPENDENT_AMBULATORY_CARE_PROVIDER_SITE_OTHER): Payer: Self-pay | Admitting: Internal Medicine

## 2010-08-28 ENCOUNTER — Other Ambulatory Visit: Payer: Self-pay | Admitting: Internal Medicine

## 2010-08-28 VITALS — BP 109/78 | HR 85 | Temp 98.4°F | Ht 60.0 in | Wt 156.7 lb

## 2010-08-28 DIAGNOSIS — I10 Essential (primary) hypertension: Secondary | ICD-10-CM

## 2010-08-28 DIAGNOSIS — E119 Type 2 diabetes mellitus without complications: Secondary | ICD-10-CM

## 2010-08-28 DIAGNOSIS — E785 Hyperlipidemia, unspecified: Secondary | ICD-10-CM

## 2010-08-28 DIAGNOSIS — IMO0002 Reserved for concepts with insufficient information to code with codable children: Secondary | ICD-10-CM

## 2010-08-28 DIAGNOSIS — Z9149 Other personal history of psychological trauma, not elsewhere classified: Secondary | ICD-10-CM

## 2010-08-28 LAB — GLUCOSE, CAPILLARY: Glucose-Capillary: 123 mg/dL — ABNORMAL HIGH (ref 70–99)

## 2010-08-28 MED ORDER — SERTRALINE HCL 50 MG PO TABS: 50.0000 mg | ORAL_TABLET | Freq: Every day | ORAL | Status: DC

## 2010-08-28 NOTE — Patient Instructions (Addendum)
Muy Bien!! Su peso hoy esta 156.7#, su peso esta 157.6# en el Julio  Por favor en el libro al pagina siete(7).  Hale Drone a los otros libros cerca contador de Civil Service fast streamer.  Hasta el semana proxima!

## 2010-08-29 ENCOUNTER — Telehealth: Payer: Self-pay | Admitting: Licensed Clinical Social Worker
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
## 2010-08-29 NOTE — Telephone Encounter (Signed)
Called patient using the language line and explained to her that [facility] has changed the way they are working with clients. Explained that she can go there any day during the week for walk-in intake from 8 to 3 (except the lunch hour from 12 to 1) and start services.  The patient said she would go tomorrow morning or next week and was receptive.

## 2010-08-29 NOTE — Progress Notes (Signed)
Medical Nutrition Therapy:  Appt start time: 1430 end time:  1530.  Assessment:  Primary concerns today: Weight management, Blood sugar control and Meal planning Had been trying to avoid all tortillas, but for last weeks has been eating more. Hungrier when she doe snot exercise. Has not been exercising as much. Family not going out. States fear of cars driving around house, of going outdoors. Usual eating pattern includes Meal 3 and 3+ snacks per day.    Avoided foods include: cookies and candy, soda, french fries and bread and tried to avoid tortillas Usual physical activity includes walking 30 minutes a day 7 days per week. Pedometer shows 5000-10000 steps per day.   Progress Towards Goal(s):  In progress   Nutritional Diagnosis:  NB-1.1 Food and nutrition-related knowledge deficit As related to does not understand how to use what she reads on label in her daily food choices.  As evidenced by lack of prior exposure.  Interventions: 1- Used motivational interviewing to assist patient in identifying and finding ways around barriers to exercise and maintaining healthy food choices and getting daily support for healthy habits.   2- Began calorie counting education and counseling. 3- Used social support and encouragement to help keep patient motivated.    Monitoring/Evaluation:  Dietary intake and blood sugars in 1 week(s)

## 2010-08-29 NOTE — Assessment & Plan Note (Signed)
Well-controlled on current medication regimen 

## 2010-08-29 NOTE — Assessment & Plan Note (Signed)
Patient has met with the diabetic specialist. We will obtain an A1c on her next visit. For now we'll continue Glucophage at current dose.
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
## 2010-08-29 NOTE — Assessment & Plan Note (Signed)
Well-controlled with no medication. Patient reports falling recommended diet and exercise.

## 2010-09-04 ENCOUNTER — Ambulatory Visit: Payer: Self-pay | Admitting: Dietician

## 2010-09-11 ENCOUNTER — Ambulatory Visit: Payer: Self-pay | Admitting: Dietician

## 2010-09-15 ENCOUNTER — Other Ambulatory Visit: Payer: Self-pay | Admitting: Internal Medicine

## 2010-09-18 ENCOUNTER — Ambulatory Visit (INDEPENDENT_AMBULATORY_CARE_PROVIDER_SITE_OTHER): Payer: Self-pay | Admitting: Dietician

## 2010-09-18 DIAGNOSIS — E119 Type 2 diabetes mellitus without complications: Secondary | ICD-10-CM

## 2010-09-18 NOTE — Progress Notes (Signed)
Medical Nutrition Therapy:  Appt start time: 1430 end time:  1530.  Interpreter used throughout entire session today.  Assessment:  Primary concerns today: Weight management, Blood sugar control and Meal planning Read book provided. Remembers that she needs ~ 1200 calories a day for her weight. Does not understand how to determine how much food that is. Needs assistance learning about reading labels, understanding portion sizes.  Continues to ear pedometer which shows 5000-10000 steps per day. Not at her goal which is 10000 steps most days.  Blood sugars had been low 100s until she ran out of metformin and could not get ti filled for the past 3 days. It is ready today and she will go to get it.  Weight 156.4# today,decreased by .3# over the past 3 weeks.  Progress Towards Goal(s):  In progress   Nutritional Diagnosis:   NB-1.1 Food and nutrition-related knowledge deficit improving and still needs more education and training to fully grasp information enough to use it in her daily life.   Interventions: 1- Continued calorie counting education, label reading and portion size using food models, scales and real food and empty containers. 2-  Used social support and encouragement to help keep patient motivated. 3- Provided patient with coupon for lantus insulin. 4- Education and counseling to how diet and activity  work with medication to control diabetes.    Monitoring/Evaluation:  Dietary intake and blood sugars in 3 week(s)

## 2010-09-18 NOTE — Patient Instructions (Addendum)
Trata de la contar las calorias. 1200 por dia!!!  Hasta en Octobre.

## 2010-10-07 ENCOUNTER — Encounter: Payer: Self-pay | Admitting: Internal Medicine

## 2010-10-09 ENCOUNTER — Ambulatory Visit: Payer: Self-pay | Admitting: Dietician

## 2010-10-17 ENCOUNTER — Ambulatory Visit (INDEPENDENT_AMBULATORY_CARE_PROVIDER_SITE_OTHER): Payer: Self-pay | Admitting: Internal Medicine

## 2010-10-17 VITALS — BP 112/69 | HR 100 | Temp 98.4°F | Wt 154.9 lb

## 2010-10-17 DIAGNOSIS — G4700 Insomnia, unspecified: Secondary | ICD-10-CM

## 2010-10-17 DIAGNOSIS — Z23 Encounter for immunization: Secondary | ICD-10-CM

## 2010-10-17 DIAGNOSIS — F329 Major depressive disorder, single episode, unspecified: Secondary | ICD-10-CM

## 2010-10-17 DIAGNOSIS — E119 Type 2 diabetes mellitus without complications: Secondary | ICD-10-CM

## 2010-10-17 DIAGNOSIS — I10 Essential (primary) hypertension: Secondary | ICD-10-CM

## 2010-10-17 DIAGNOSIS — M545 Low back pain: Secondary | ICD-10-CM

## 2010-10-17 LAB — POCT GLYCOSYLATED HEMOGLOBIN (HGB A1C): Hemoglobin A1C: 7.4

## 2010-10-17 LAB — GLUCOSE, CAPILLARY: Glucose-Capillary: 275 mg/dL — ABNORMAL HIGH (ref 70–99)

## 2010-10-17 MED ORDER — BACLOFEN 10 MG PO TABS: 10.0000 mg | ORAL_TABLET | Freq: Three times a day (TID) | ORAL | Status: AC | PRN

## 2010-10-17 MED ORDER — FLUOXETINE HCL 10 MG PO CAPS: 10.0000 mg | ORAL_CAPSULE | Freq: Every day | ORAL | Status: DC

## 2010-10-17 NOTE — Progress Notes (Signed)
  Subjective:    Patient ID: Robin Klein, female    DOB: [birth year], 40 y.o.   MRN: [id number]  HPI  [name] is a 40 year old Spanish-speaking female who comes in today for multiple medical complaints. She has  headache, stable, described as pain and numbness all over the head, throbbing, worse with movement, brufen and sleep makes it better. No exacerbating factors. Patient has been having Nausea and vomiting since last few days( 7 days) It has settled down now and she just feels nauseated. Her daughter had similar complaints 1 week ago and also her son was admitted for similar complaints. Nausea and vomiting were a/w very high fever, abdominal pain, started Friday but better now with no complaints. Just nauseated all the time.  Depression: Feels like hitting herself and severely depressed. Also wants to run away. She is getting counselling at [facility] and also started on sertraline by Dr Izola Price which she did not tolerate.  Diabetes is well controlled and Hba1c is 7.4 today. She just ate prior to coming here and that's why her CBG is very high.  She also complains of back pain which is worse with walking and bending and better with rest.  She wants to get a flu shot today.       Review of Systems  Constitutional: Positive for fatigue. Negative for fever, activity change and appetite change.  HENT: Negative for sore throat.   Eyes: Positive for pain.  Respiratory: Negative for cough and shortness of breath.   Cardiovascular: Negative for chest pain and leg swelling.  Gastrointestinal: Negative for nausea, abdominal pain, diarrhea, constipation and abdominal distention.  Genitourinary: Negative for frequency, hematuria and difficulty urinating.  Musculoskeletal: Positive for back pain.  Neurological: Positive for numbness and headaches. Negative for dizziness.  Psychiatric/Behavioral: Positive for dysphoric mood and decreased concentration. Negative for suicidal  ideas and behavioral problems.       As described in HPI       Objective:   Physical Exam  Constitutional: She is oriented to person, place, and time. She appears well-developed and well-nourished.  HENT:  Head: Normocephalic and atraumatic.  Eyes: Conjunctivae and EOM are normal. Pupils are equal, round, and reactive to light. No scleral icterus.  Neck: Normal range of motion. Neck supple. No JVD present. No thyromegaly present.  Cardiovascular: Normal rate, regular rhythm, normal heart sounds and intact distal pulses.  Exam reveals no gallop and no friction rub.   No murmur heard. Pulmonary/Chest: Effort normal and breath sounds normal. No respiratory distress. She has no wheezes. She has no rales.  Abdominal: Soft. Bowel sounds are normal. She exhibits no distension and no mass. There is no tenderness. There is no rebound and no guarding.  Musculoskeletal: Normal range of motion. She exhibits tenderness. She exhibits no edema.       Tenderness present on the left side of her spine with muscle spasticity  Lymphadenopathy:    She has no cervical adenopathy.  Neurological: She is alert and oriented to person, place, and time.  Psychiatric: She has a normal mood and affect. Her behavior is normal.          Assessment & Plan:

## 2010-10-17 NOTE — Patient Instructions (Signed)
Ideas de suicidio- Como ayudarse usted mismo (Suicidal Feelings - How to Help Yourself) La idea de suicidio es un sentimiento de desesperanza que parece ser demasiado intenso como para superarlo. Es como estar frente a una montaa demasiado alta y no tener la suficiente capacidad para escalarla. Recuerde, estos sentimientos son temporarios! Pronto desaparecern. Si siente que ha llegado a un punto en el que el suicidio parece ser la nica [place], es el momento de pedir [place]. Algunos de los pasos siguientes lo guiarn en la direccin correcta. COMO HACER FRENTE A LA IDEA DE SUICIDIO Y COMO PREVENIRLO  Hable con su familia, amigos, maestros o terapeutas. Busque ayuda. Trate de no alejarse de [employer] se preocupan por usted. Aunque no sienta deseos de [address] acompaado o piense que usted no es [place] buena compaa, ellos querrn [place].   Consuma regularmente una dieta balanceada y descanse lo suficiente.   Evite medicamentos y depresores como el alcohol, que pueden disminuir sus inhibiciones. Retrelos de Pensions consultant. Si est tomando antidepresivos, hable con su terapeuta acerca de sus sentimientos de modo que pueda administrarle medicamentos ms seguros, en caso de que puedan transformarse en un problema.   Retire las armas o las sustancias venenosas de su casa.   Trate de cumplir con sus rutinas. Estas pueden ser desde llevar a pasear al perro o alimentar al gato, pero siempre siguiendo un horario y recordando usted mismo que debe cumplirlo [employer]. Juegue con sus mascotas. Si no tiene [employer], en lo posible trate de [employer]. Le dar una sensacin de bienestar, disminuir su presin arterial y har que su corazn est mejor. Su mascota lo Pension scheme manager, y todos deseamos ser tiles.   Establezca algunos objetivos factibles de cumplir y trate de [place]. Haga una lista y tache las cosas con las que ya cumpli. Los logros [employer].   En lo posible, trate de  comenzar con un programa de ejercicios. An media hora de ejercicios por da lo harn sentirse mejor y lo ayudarn a recuperarse de la depresin. Si tiene Administrator preferido para caminar, aprovchelo.   Aumente aquellas actividades seguras que siempre le han dado Immunologist. Puede ser tocar su msica preferida, leer un buen libro, pintar un cuadro o ejecutar su instrumento favorito. Haga todo lo posible para quitar de su mente la depresin y para tratar de Horticulturist, commercial.   Mantenga su lugar bien iluminado, con las ventanas abiertas y deje entrar el sol. La luz brillante puede definitivamente tratar la depresin, no slo en aquellas personas que sufren un trastorno Programmer, applications.  Pero sobre todo recuerde, la depresin es temporaria. Pronto desaparecer. No piense en el suicidio. Una solucin permanente no es la [place]. El suicidio le quitar el tiempo maravilloso que le queda de vida y ser un dao que perdurar en aquellos que lo rodean y que lo [place]. LA AYUDA EST A SU ALCANCE. Las lneas telefnicas de ayuda al suicida las 24 horas son: 1-800-SUICIDE [phone] Document Released: 10/08/2005 Document Re-Released: 03/18/2009 [facility] Patient Information 2011 [place], [place].Depresin (Depression) Usted presenta signos de depresin. Es un trastorno que puede ocurrir a Actuary. Con frecuencia es difcil de Public house manager. Neomia Dear persona puede estar deprimida y adems tener momentos de Archivist. La depresin interfiere en la capacidad bsica para funcionar en la vida diaria. Obstaculiza tanto las [employer] hbitos de sueo, alimentacin y [place]. CAUSAS Se cree que la causa es un desequilibrio en las sustancias qumicas del cerebro. El origen puede ser  un hecho displacentero. La crisis en una relacin, la muerte de un familiar, preocupaciones econmicas, la jubilacin y otros factores estresantes son causas normales de depresin. Tambin puede comenzar sin causa aparente. Otros  factores que pueden tener incidencia: algunas enfermedades, algunos medicamentos, factores genticos, y consumo excesivo de alcohol o drogas. SNTOMAS  Sentimiento de desdicha o desvalorizacin.   Cansancio crnico o sensacin de agotamiento.   Pensamientos y acciones de autodestruccin.   Dificultad para dormir o dormir demasiado.   Comer ms de lo habitual o no alimentarse en absoluto.   Cefaleas o ansiedad.   Dificultad para concentrase o tomar decisiones.   Sntomas fsicos sin causa y consumo de drogas.  TRATAMIENTO Generalmente mejora si se realiza Pharmacist, community. Entre ellos se incluyen:  Medicamentos antidepresivos. Puede demorara algunas semanas antes de llegar a la dosis [place] y a los beneficios.   Converse con un terapeuta, ministro, consejero o amigo. Estas personas pueden ayudarlo a comprender su problema y a controlar nuevamente sus actos.   Consuma una dieta saludable.   Ralice actividad fisica de [address] regular, como caminar durante 30 minutos [address].   No consuma alcohol ni drogas.  El tratamiento de la depresin puede llevar 6 meses o ms. El tratamiento debe mantenerse para evitar que los sntomas vuelvan a Research officer, trade union. Asegrese de comunicarse con el profesional que lo asiste y Surveyor, mining una entrevista de control, como se lo ha sugerido el equipo que lo ha [place]. Document Released: 12/22/2004 Document Re-Released: 06/11/2009 [facility] Patient Information 2011 [place], [place].

## 2010-10-20 DIAGNOSIS — F329 Major depressive disorder, single episode, unspecified: Secondary | ICD-10-CM | POA: Insufficient documentation

## 2010-10-20 NOTE — Assessment & Plan Note (Signed)
Patient' headache were associated with difficulty sleep and we discussed the various sleep hygiene methods. Patient is also severely depressed lately and hopefully with the new SSRI started today. She will feel better. She was encouraged to use advil/tylenol as needed for headaches.

## 2010-10-20 NOTE — Assessment & Plan Note (Signed)
Well controlled 

## 2010-10-20 NOTE — Assessment & Plan Note (Signed)
Given she has stiffness in the paraspinal muscles and no red flag signs, I will give her a short course of muscle relaxant therapy.

## 2010-10-20 NOTE — Assessment & Plan Note (Signed)
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 She is not agreeable to starting a new med as of now but we discussed about use of gabapentin if pain is worse. No new changes in meds today.

## 2010-11-06 ENCOUNTER — Ambulatory Visit (INDEPENDENT_AMBULATORY_CARE_PROVIDER_SITE_OTHER): Payer: Self-pay | Admitting: Internal Medicine

## 2010-11-06 VITALS — BP 112/77 | HR 94 | Temp 97.6°F | Wt 152.0 lb

## 2010-11-06 DIAGNOSIS — E119 Type 2 diabetes mellitus without complications: Secondary | ICD-10-CM

## 2010-11-06 DIAGNOSIS — F329 Major depressive disorder, single episode, unspecified: Secondary | ICD-10-CM

## 2010-11-06 DIAGNOSIS — R52 Pain, unspecified: Secondary | ICD-10-CM

## 2010-11-06 DIAGNOSIS — N912 Amenorrhea, unspecified: Secondary | ICD-10-CM

## 2010-11-06 LAB — POCT URINE PREGNANCY: Preg Test, Ur: NEGATIVE

## 2010-11-06 MED ORDER — FLUOXETINE HCL 20 MG PO CAPS: 20.0000 mg | ORAL_CAPSULE | Freq: Every day | ORAL | Status: DC

## 2010-11-06 NOTE — Assessment & Plan Note (Signed)
Patient reports that she is feeling much better. I would go up on the dose of Prozac 20 mg daily. Does not report any suicidal ideations or homicidal ideations and is doing well with her counseling sessions. Has a counseling session today.

## 2010-11-06 NOTE — Progress Notes (Signed)
  Subjective:    Patient ID: Robin Klein, female    DOB: 1970-08-18, 40 y.o.   MRN: [id number]  HPI [name] is 40 years old patient of mine.  She is here today for follow up of her depression. She feels much better and tolerating prozac very well. She is enjoying the counselling sessions and has lost 3 pounds.  She has not has periods since Aug 25th 2012. UPT done on Sept 1st sept 10th were negative. She wants to use birth control pills to regularize her menses and also for birth control. She does not smoke or does not have h/o DVT. Starting her BCP is a reasonable approach.  Patient has not had eye exam in last 1 year.  She has lost 2.9 pounds, I congratulated her.  She requests a prescription for another meter as she dropped her previous one.   Review of Systems  Constitutional: Negative for fever, activity change and appetite change.  HENT: Negative for sore throat.   Respiratory: Negative for cough and shortness of breath.   Cardiovascular: Negative for chest pain and leg swelling.  Gastrointestinal: Negative for nausea, abdominal pain, diarrhea, constipation and abdominal distention.  Genitourinary: Negative for frequency, hematuria and difficulty urinating.  Neurological: Negative for dizziness and headaches.  Psychiatric/Behavioral: Negative for suicidal ideas and behavioral problems.       Objective:   Physical Exam  Constitutional: She is oriented to person, place, and time. She appears well-developed and well-nourished.  HENT:  Head: Normocephalic and atraumatic.  Eyes: Conjunctivae and EOM are normal. Pupils are equal, round, and reactive to light. No scleral icterus.  Neck: Normal range of motion. Neck supple. No JVD present. No thyromegaly present.  Cardiovascular: Normal rate, regular rhythm, normal heart sounds and intact distal pulses.  Exam reveals no gallop and no friction rub.   No murmur heard. Pulmonary/Chest: Effort normal and breath sounds  normal. No respiratory distress. She has no wheezes. She has no rales.  Abdominal: Soft. Bowel sounds are normal. She exhibits no distension and no mass. There is no tenderness. There is no rebound and no guarding.  Musculoskeletal: Normal range of motion. She exhibits no edema and no tenderness.  Lymphadenopathy:    She has no cervical adenopathy.  Neurological: She is alert and oriented to person, place, and time.  Psychiatric: She has a normal mood and affect. Her behavior is normal.          Assessment & Plan:

## 2010-11-06 NOTE — Assessment & Plan Note (Signed)
Back pain is much relieved today. I have asked her to cut down the baclofen to an as needed basis.

## 2010-11-06 NOTE — Patient Instructions (Signed)
Dieta para diabticos de 1800 caloras (1800 Calorie Diabetic Diet) Esta dieta para diabticos limita las caloras a 1800 por da. Si sigue esta dieta y un plan de comidas saludable podr mejorar su estado general de salud. Le ayudar a controlar los niveles de Production assistant, radio y a Conservator, museum/gallery presin arterial y los niveles de [place].  PORCIONES La medicin de los alimentos y el tamao de las porciones lo ayudarn a Scientist, physiological cantidad exacta de comida que debe ingerir. La lista que sigue le mostrar el tamao de algunas porciones comunes.   1 onza.................4 dados apilados   3 onzas..............Marland KitchenUn mazo de cartas   1 cucharadita...Marland KitchenMarland KitchenLa punta de un dedo pequeo   1 cucharada.......Marland KitchenUn dedo   2 cucharadas....Marland KitchenMarland KitchenUna pelota de golf    taza..................la mitad de un puo   1 taza..................Marland KitchenUn puo  GUA PARA LA ELECCIN DE LOS ALIMENTOS El objetivo de esta dieta es consumir alimentos variados y Film/video editor la cantidad de caloras a 1800 por Futures trader. Esto puede lograrse eligiendo los alimentos que son bajos en caloras y en grasas. La dieta tambin aconseja consumir porciones pequeas de alimento con frecuencia. Esto ayuda a [employer] de azcar en la sangre de modo que no suba o baje demasiado. Cada alimento o colacin deber incluir una protena para ayudarlo a sentirse ms satisfecho. Consuma aproximadamente la misma cantidad de alimentos a la [employer]. Esto incluye fines de [place], [address] de viaje y [address] [place] [address] [place]. Coma cada 4 a 5 hs, y aada una colacin entre comidas, si lo desea.  Por ejemplo, un plan diario de comidas incluye el desayuno, una colacin por la maana, almuerzo, cena y [place] colacin por la noche. En las comidas saludables y las colaciones puede incluir una variedad de [employer] se incluyen granos enteros, vegetales, frutas, carnes [place], aves, pescado y productos lcteos. Cuando planee sus comidas, seleccione  una variedad de alimentos. Elija entre panes y fculas, vegetales, frutas, productos lcteos y carnes o protenas. Abajo se enumeran los ejemplos para cada grupo, con las porciones sugeridas. Utilice tazas y cucharas para familiarizarse con el tamao de las porciones. Panes y fculas Cada porcin son 15 gramos de carbohidratos.  1 rebanada de pan    de rosquilla    taza de cereal fro (sin azcar)    taza de cereales calientes, pastas cocidas o pur de papas   1 papa pequea (del tamao de un ratn de ordenador)   ? taza de pasta o arroz cocido    bollito tipo ingls   1 taza de caldo   3 tazas de palomitas de maz   4 a 6 rebanadas de pan integral    taza de porotos, arvejas o maz  Vegetales Cada porcin son 5 gramos de carbohidratos   taza de vegetales cocidos   1 taza de vegetales crudos    taza de jugo de tomate o vegetales  Frutas Cada porcin son 15 gramos de carbohidratos  1 manzana o naranja pequea   1  taza de sanda o frutillas.    taza de compota de Psychologist, educational (sin agregado de International aid/development worker)   2 cucharadas de pasas de uva    banana    taza de fruta envasada en su propio jugo o en agua    taza de jugo de frutas sin azcar  Lcteos Cada porcin son 12 a 15 gramos de carbohidratos  1 taza de Metallurgist   6 onzas de yogur endulzado artificialmente  o natural.   1 taza de crema baja en grasas.   1 taza de leche de soja   1 taza de Latimer de almendras  Carnes/protenas  1 huevo grande   2 -3 onzas (60 - 80 gr) de carne, ave o pescado.    de taza de queso cottage con bajo contenido de grasa   1 cucharada de [place] de man   1 onza de queso bajo en grasa    de taza de atn al agua    taza de tofu  Grasas  1 cucharadita de aceite   1 cucharadita de margarina baja en grasas   1 cucharadita de mantequilla   1 cucharadita de [place]   2 cucharadas de aguacate   1 cucharadita de aderezo para ensaladas   1  cucharadita de queso crema   2 cucharaditas de crema cida  EJEMPLO DE DIETA DE 1800 CALORAS Desayuno   taza de cereal fro (sin azcar)   1 taza de [employer] (1 porcin de carbohidratos)   1 tostada de pan integral(1 porcin de carbohidratos)    banana pequea (1 porcin de carbohidratos)   1 huevo revuelto   1 cucharadita de margarina baja en grasas  Almuerzo  [place] de atn   2 tostadas de pan integral (2 porciones de carbohidratos)    taza de atn envasado en agua y escurrido   1 cucharadas de mayonesa light   1 tallo de apio picado   2 rebanadas de tomate   1 hoja de Company secretary   1 taza de zanahorias en bastones   24/-30 uvas sin semillas (2 porciones de carbohidratos)    taza de yogur Light (1 porcin de carbohidratos)  Colacin de media tarde  3 galletas graham (1 porcin de carbohidratos)   1 taza de [employer] (1 porcin de carbohidratos)   1 cucharada de mantequilla de man  Cena  3 oz (80 gr) de salmn a la parrilla con 1 cucharadita de aceite.   1 taza de pur de patatas (2 carbohidratos) con Neomia Dear cucharadita de margarina baja en grasas.   1 taza de arvejas frescas o congeladas.   1 taza de esprragos al vapor.   1 taza de [employer] (1 porcin de carbohidratos)  Colacin de la noche  3 tazas de palomitas de maz (1 porcin de carbohidratos)   2 cucharaditas de queso parmesano.  PLAN DE COMIDAS Puede utilizar esta hoja para realizar su plan de comidas basndose en las indicaciones para la dieta de 1800 caloras para el diabtico Si [employer] plan para ayudar a Scientist, physiological glucosa en sangre, podr [employer] carbohidratos de los alimentos (lcteos, fculas y frutas). Seleccione una variedad de alimentos frescos de colores y sabores variados. La cantidad total de carbohidratos en los alimentos es ms importante que asegurarse de incluir todos los grupos alimenticios cada vez que come. Puede elegir aproximadamente  muchos de los siguientes alimentos para sus comidas diarias:  8 porciones de fculas   4 porciones de vegetales   3 porciones de frutas   2 porcin de lcteos   [phone] g (100 - 200 gr) de carnes/protenas   Hasta 4 porciones de grasas  El dietista podr utilizar esta hoja para ayudarlo a decidir cuntas porciones y qu tipos de alimentos son los adecuados para usted. DESAYUNO Grupo de alimentos y porciones / Alimento elegido Fculas ________________________________________________________ Sheppard Penton ________________________________________________________ Lou Miner __________________________________________________________ Carne/protena __________________________________________________ Rosalin Hawking __________________________________________________________ Lorin Mercy de alimentos y porciones / Alimento elegido Fcula ________________________________________________________  Carnes/protenas _______________________________________________ Rufina Falco ______________________________________________________ Lou Miner __________________________________________________________ Sheppard Penton ________________________________________________________ Rosalin Hawking _________________________________________________________ Waldo Laine DE MEDIA TARDE Grupo de alimentos y porciones / Alimento elegido Fcula ________________________________________________________ Mardee Postin _________________________________________________ Lou Miner __________________________________________________________ Sheppard Penton ________________________________________________________ [place] de alimentos y porciones / Alimento elegido Fculas ________________________________________________________ Carnes/protenas ________________________________________________ Sheppard Penton ________________________________________________________ Vegetales ______________________________________________________ Lou Miner  __________________________________________________________ Rosalin Hawking _________________________________________________________ Waldo Laine DE LA NOCHE Grupo de alimentos y porciones / Alimento elegido Fruta __________________________________________________________ Carnes/protenas ________________________________________________ Sheppard Penton ________________________________________________________ Rulon Sera _________________________________________________________ Marily Memos Fculas _______________________________________________________ Vegetales _____________________________________________________ Lou Miner _________________________________________________________ Sheppard Penton _______________________________________________________ Carne/protena _________________________________________________ Rosalin Hawking ________________________________________________________ Document Released: 04/08/2006 Document Revised: 09/03/2010 ExitCare Patient Information 2012 [place].

## 2010-11-06 NOTE — Assessment & Plan Note (Signed)
Continue To lose weight and start 1800-calorie diabetic diet. Last HbA1c 7.4 in October 2012. Lupita Leash is going to help her obtain another bg meter by calling health Department. Refer to ophthalmologist for annual eye exam.

## 2010-12-01 ENCOUNTER — Emergency Department (HOSPITAL_COMMUNITY)
Admission: EM | Admit: 2010-12-01 | Discharge: 2010-12-01 | Disposition: A | Discharge status: Home / Self Care | Payer: Self-pay | Source: Home / Self Care

## 2010-12-01 NOTE — ED Notes (Signed)
No answer in lobby x 2.

## 2010-12-01 NOTE — ED Provider Notes (Signed)
History     CSN: [id number] Arrival date & time: No admission date for patient encounter.   First MD Initiated Contact with Patient 12/01/10 1004      No chief complaint on file.   (Consider location/radiation/quality/duration/timing/severity/associated sxs/prior treatment) HPI  Past Medical History  Diagnosis Date  . Diabetes mellitus   . Hypertension   . Fatty liver disease, nonalcoholic     No past surgical history on file.  No family history on file.  History  Substance Use Topics  . Smoking status: Never Smoker   . Smokeless tobacco: Not on file  . Alcohol Use: No    OB History    Grav Para Term Preterm Abortions TAB SAB Ect Mult Living                  Review of Systems  Allergies  Review of patient's allergies indicates no known allergies.  Home Medications   Current Outpatient Rx  Name Route Sig Dispense Refill  . WAVESENSE PRESTO PRO METER DEVI  Use to check blood sugar up to 2 times daily     . FLUOXETINE HCL 20 MG PO CAPS Oral Take 1 capsule (20 mg total) by mouth daily. 30 capsule 2  . GLUCOSE BLOOD VI STRP  Use to test blood sugar up to 2 times daily     . INSULIN GLARGINE 100 UNIT/ML [place] SOLN Subcutaneous Inject 32 Units into the skin daily. To control blood sugars    . INSULIN SYRINGE-NEEDLE U-100 31G X 5/16" 0.5 ML MISC  Use to inject insulin once daily     . LANCETS MISC  Use to check blood sugar as directed     . LISINOPRIL-HYDROCHLOROTHIAZIDE 20-25 MG PO TABS  TAKE ONE TABLET BY MOUTH EVERY DAY 31 tablet 11  . METFORMIN HCL 1000 MG PO TABS  TAKE ONE TABLET BY MOUTH TWICE DAILY 60 tablet 11  . OMEPRAZOLE 20 MG PO CPDR Oral Take 20 mg by mouth daily. Once a day in morning empty stomach       There were no vitals taken for this visit.  Physical Exam  ED Course  Procedures (including critical care time)  Labs Reviewed - No data to display No results found.   No diagnosis found.    MDM  Patient left without being  seen        Randa Spike, MD 12/04/10 0830

## 2010-12-03 ENCOUNTER — Telehealth: Payer: Self-pay | Admitting: Dietician

## 2010-12-03 NOTE — Telephone Encounter (Signed)
Patient agreed to start over the counter 81 mg aspirin daily

## 2010-12-04 ENCOUNTER — Encounter: Payer: Self-pay | Admitting: Internal Medicine

## 2010-12-04 ENCOUNTER — Ambulatory Visit (INDEPENDENT_AMBULATORY_CARE_PROVIDER_SITE_OTHER): Payer: Self-pay | Admitting: Internal Medicine

## 2010-12-04 DIAGNOSIS — E785 Hyperlipidemia, unspecified: Secondary | ICD-10-CM

## 2010-12-04 DIAGNOSIS — I10 Essential (primary) hypertension: Secondary | ICD-10-CM

## 2010-12-04 DIAGNOSIS — E119 Type 2 diabetes mellitus without complications: Secondary | ICD-10-CM

## 2010-12-04 NOTE — Assessment & Plan Note (Signed)
Last lipid profile 5 months ago. She is currently at goal. I would repeat lipid profile next year.

## 2010-12-04 NOTE — Assessment & Plan Note (Signed)
Patient's blood pressure is well controlled. No recent changes in her medication. I would repeat metabolic profile as needed.

## 2010-12-04 NOTE — Patient Instructions (Signed)
Dieta basada en el recuento de caloras (Calorie Counting Diet) Una dieta basada en el recuento de caloras requiere que consuma la cantidad de caloras que usted necesita durante Medical laboratory scientific officer. Las caloras son la medida de la energa que se obtienen de los alimentos que consumimos. Ingerir la cantidad [place] de caloras es importante para Pharmacologist un peso saludable. Si ingiere demasiadas caloras, el organismo las [employer] y aumentar de [place]. Si ingiere pocas caloras, perder peso. El recuento de las caloras que se ingieren durante Designer, fashion/clothing si recibe la cantidad Geologist, engineering. Un nutricionista matriculado podr determinar cuntas caloras necesita por da. La cantidad de caloras necesarias vara de Neomia Dear persona a otra. Si su objetivo es perder peso deber consumir menos caloras. Si tiene sobrepeso o tiene problemas de [employer] cardacas, hipertensin arterial o diabetes, la prdida de algunos kilos lo beneficiar. Si su objetivo es aumentar de peso deber consumir ms caloras. Si tiene problemas de salud que requieran que usted tenga ms [place], puede ser necesario que aumente de [place]. CONSEJOS Ya sea que aumente o disminuya el nmero de caloras que consume [employer], puede ser difcil acostumbrarse a Multimedia programmer los hbitos de lo que come o bebe. Los siguientes consejos lo ayudarn a Midwife un control del nmero de caloras que consume.  La medicin de los [employer] hogar, con una jarra medidora lo ayudar a saber la cantidad real de alimentos y caloras que consume.   En los restaurantes se sirven alimentos en diferentes porciones y tamaos. Es frecuente que en los restaurantes se sirvan los alimentos en cantidades que puedan dividirse en 2 porciones o ms. Al salir a Psychologist, forensic, puede ser til estimar cuntas porciones le sirven. Por ejemplo, una porcin a arroz cocido es de  taza y tiene el tamao de la mitad de un puo. Conocer el tamao de las  porciones le ayudar a tener una mejor idea de la cantidad de comida que come en un restaurante.   Pida porciones pequeas o solicite las porciones para nios.   Planifique comer la mitad de una porcin en el restaurante y lleve el resto a su hogar o comprtala con un amigo.   Lea las etiquetas para conocer el contenido de caloras y el tamao de las porciones. La mayora de los alimentos envasados tiene la informacin nutricional en algn lugar del envase. Aqu podr encontrar cuntas porciones contiene el envase, el tamao de la porcin y el nmero de caloras de [place].   Por ejemplo, digamos que un envase contiene tres galletitas. En la Informacin Nutricional se [place] que una porcin corresponde a Financial controller. [place], dice que hay tres porciones en el envase. En la seccin de las caloras se informa que contiene 90 caloras. Esto significa que cada galletita tiene 90 caloras. Si come una galletita, habr consumido 90 caloras. Si come tres galletitas, habr consumido tres veces esa cantidad, o sea 270 caloras.  La lista que sigue le mostrar el tamao de algunas porciones comunes.   1 onza.................4 dados apilados   3 onzas..............Marland KitchenUn mazo de cartas   1 cucharadita...Marland KitchenMarland KitchenLa punta de un dedo pequeo   1 cucharada.......Marland KitchenUn dedo   2 cucharadas....Marland KitchenMarland KitchenUna pelota de golf    taza..................la mitad de un puo   1 taza..................Marland KitchenUn puo  LLEVE UN REGISTRO DE LO QUE COME Escriba todo lo que come, las cantidades y Medical illustrator de Advertising copywriter en cada comida que haga Administrator. Al final o durante el  da puede ir sumando la cantidad de caloras que ha ingerido. Puede ser de utilidad crear Neomia Dear lista como la que se [employer] a continuacin. Conozca la informacin sobre caloras [employer]. Desayuno  Salvado (1 taza, 110 caloras).   Leche descremada ( taza, 45 caloras).  Colacin  Manzana (1 mediana, 80 caloras).  Almuerzo  [place] (1 taza,  20 caloras).   Tomate ( mediana, 20 caloras).   Pollo, pechuga (3 onzas, 165 caloras).   Queso shredded cheddar ( taza, 110 calories).   Aderezo italiano diettico (2 cucharadas, 60 caloras).   Pan de trigo integral (1 rebanada, 80 caloras).   Margarina (1 cucharada, 35 caloras).   Sopa de vegetales (1 taza, 160 caloras).  Cena  [place] de cerdo (3 onzas, 190 caloras).   Arroz marrn (1 taza, 215 caloras).   Brcoli hervido ( cup, 20 calories).   Jinny Sanders (1  taza, 65 caloras).   Crema batida (1 cucharada, 50 caloras).  Total de caloras diarias: 1425 Document Released: 04/09/2008 Document Revised: 09/03/2010 [facility] Patient Information 2012 [place], [place].

## 2010-12-04 NOTE — Assessment & Plan Note (Addendum)
CBG's controlled at this time. No new changes made today. Advised her to continue to lose weight. I will start her aspirin for primary prevention of CAD. Although there is scarce evidence regarding its use in primary prevention in diabetic population, most of the guidelines still recommend aspirin to be used for primary prevention.

## 2010-12-04 NOTE — Progress Notes (Signed)
  Subjective:    Patient ID: Robin Klein, female    DOB: March 28, 1970, 40 y.o.   MRN: [id number]  HPI [name] is a 40 year old patient of mine who is here today for follow up of her depression. Patient is doing very well. She says that she does not have any depression at this time. She has been compliant with her fluoxetine.  Her CBGs have been better controlled now since she has started insulin.  I will also start aspirin today.  Review of Systems  Constitutional: Negative for fever, activity change and appetite change.  HENT: Negative for sore throat.   Respiratory: Negative for cough and shortness of breath.   Cardiovascular: Negative for chest pain and leg swelling.  Gastrointestinal: Negative for nausea, abdominal pain, diarrhea, constipation and abdominal distention.  Genitourinary: Negative for frequency, hematuria and difficulty urinating.  Neurological: Negative for dizziness and headaches.  Psychiatric/Behavioral: Negative for suicidal ideas and behavioral problems.       Objective:   Physical Exam  Constitutional: She is oriented to person, place, and time. She appears well-developed and well-nourished.  HENT:  Head: Normocephalic and atraumatic.  Eyes: Conjunctivae and EOM are normal. Pupils are equal, round, and reactive to light. No scleral icterus.  Neck: Normal range of motion. Neck supple. No JVD present. No thyromegaly present.  Cardiovascular: Normal rate, regular rhythm, normal heart sounds and intact distal pulses.  Exam reveals no gallop and no friction rub.   No murmur heard. Pulmonary/Chest: Effort normal and breath sounds normal. No respiratory distress. She has no wheezes. She has no rales.  Abdominal: Soft. Bowel sounds are normal. She exhibits no distension and no mass. There is no tenderness. There is no rebound and no guarding.  Musculoskeletal: Normal range of motion. She exhibits no edema and no tenderness.  Lymphadenopathy:    She has no  cervical adenopathy.  Neurological: She is alert and oriented to person, place, and time.  Psychiatric: She has a normal mood and affect. Her behavior is normal.          Assessment & Plan:

## 2010-12-22 ENCOUNTER — Ambulatory Visit (INDEPENDENT_AMBULATORY_CARE_PROVIDER_SITE_OTHER): Payer: Self-pay | Admitting: Obstetrics & Gynecology

## 2010-12-22 ENCOUNTER — Encounter: Payer: Self-pay | Admitting: Obstetrics and Gynecology

## 2010-12-22 DIAGNOSIS — N926 Irregular menstruation, unspecified: Secondary | ICD-10-CM

## 2010-12-22 DIAGNOSIS — N912 Amenorrhea, unspecified: Secondary | ICD-10-CM

## 2010-12-22 LAB — POCT PREGNANCY, URINE: Preg Test, Ur: NEGATIVE

## 2010-12-22 NOTE — Progress Notes (Incomplete Revision)
  Subjective:    Patient ID: Robin Klein, female    DOB: [birth year], 40 y.o.   MRN: [id number]  [id number] Patient's last menstrual period was 11/19/2010. Pt had more irregular menses in the las year, missing period for up to 3 mo. She had a pos pregnancy test in November then bled. She wants to wait to decide on future pregnancy. Past Medical History  Diagnosis Date  . Diabetes mellitus   . Hypertension   . Fatty liver disease, nonalcoholic   . Depression    Past Surgical History  Procedure Date  . Cesarean section    Current Outpatient Prescriptions on File Prior to Visit  Medication Sig Dispense Refill  . aspirin 81 MG tablet Take 81 mg by mouth daily. Patient agreed to start over the counter 81 mg aspirin daily      . Blood Glucose Monitoring Suppl (WAVESENSE PRESTO PRO METER) DEVI Use to check blood sugar up to 2 times daily       . FLUoxetine (PROZAC) 20 MG capsule Take 1 capsule (20 mg total) by mouth daily.  30 capsule  2  . glucose blood (WAVESENSE PRESTO TEST) test strip Use to test blood sugar up to 2 times daily       . insulin glargine (LANTUS) 100 UNIT/ML injection Inject 32 Units into the skin daily. To control blood sugars      . Insulin Syringe-Needle U-100 (GNP INSULIN SYRINGE) 31G X 5/16" 0.5 ML MISC Use to inject insulin once daily       . Lancets MISC Use to check blood sugar as directed       . lisinopril-hydrochlorothiazide (PRINZIDE,ZESTORETIC) 20-25 MG per tablet TAKE ONE TABLET BY MOUTH EVERY DAY  31 tablet  11  . metFORMIN (GLUCOPHAGE) 1000 MG tablet TAKE ONE TABLET BY MOUTH TWICE DAILY  60 tablet  11  . omeprazole (PRILOSEC) 20 MG capsule Take 20 mg by mouth daily. Once a day in morning empty stomach        No Known Allergies History   Social History  . Marital Status: Married    Spouse Name: N/A    Number of Children: N/A  . Years of Education: N/A   Occupational History  . Not on file.   Social History Main Topics  . Smoking status:  Never Smoker   . Smokeless tobacco: Never Used  . Alcohol Use: No  . Drug Use: No  . Sexually Active: Yes    Birth Control/ Protection: None   Other Topics Concern  . Not on file   Social History Narrative   Financial assistance approved for 100% discount at [facility] and has [facility] card per [place]12/12/2011Recently gave birth to a son 04/2009.      Review of Systems No discharge, bleeding or pain    Objective:   Physical Exam NAD, pleasant Filed Vitals:   12/22/10 1436  Height: 4\' 11"  (1.499 m)  Weight: 154 lb 4.8 oz (69.99 kg)   Abd not tender Pelvic: vagina, cx nl, pap done, nl uterus, no mass   ucg neg      Assessment & Plan:  Irregular menses. Record menses, use condoms, report if problems persist  Francetta Ilg '

## 2010-12-22 NOTE — Progress Notes (Signed)
  Subjective:    Patient ID: Robin Klein, female    DOB: 08-13-1970, 40 y.o.   MRN: [id number]  HPI    Review of Systems     Objective:   Physical Exam        Assessment & Plan:

## 2010-12-22 NOTE — Patient Instructions (Signed)
Mtodos anticonceptivos de Warehouse manager (Water quality scientist, Archivist Methods) Los mtodos de barrera se Insurance risk surveyor para Location manager. Ellos incluyen:  Condn masculino.   Condn femenino.   Diafragma.   Capuchn cervical.   Esponja.   Espermicidas (destruyen los espermatozoides).  Los mtodos de barrera no son Engineering geologist en la prevencin del [place] otros tipos de control de la natalidad (anticonceptivos [place], DIU, inyecciones de Education officer, museum). Si se [employer] de barrera al [employer], se logra ms efectividad en la prevencin del Psychiatrist. Mtodos anticonceptivos de barrera: CONDN MASCULINO  El condn (de ltex o goma) es un delgado capuchn que se coloca sobre el pene erecto antes de la relacin sexual. Los condones fabricados en ltex son los mejores.   Evitan el embarazo impidiendo que el esperma llegue al tero.   No lo use con de vaselina, lociones o aceites. Esto disminuye su efectividad. Pueden usarse con lubricantes a base de agua que no tengan aceites.   Ayudan a Health visitor las enfermedades de transmisin sexual. Son muy efectivos en la prevencin del sida.   Estn fcilmente disponibles en la mayora de las [place]. No es necesaria la prescripcin mdica.   El porcentaje de fallas se estima en aproximadamente un 14% durante el primer ao de uso.   Las personas con Programmer, multimedia a ltex no pueden usarlos.  CONDN FEMENINO  El condn femenino es una vaina de poliuretano que evite el embarazo atrapando el esperma y obstruyendo el pasaje del semen al tero.   Puede usarse slo una vez.   Sara Lee. El anillo en el extremo cerrado de la vaina sirve como mecanismo de insercin. Tambin ayuda mantenerlo en su lugar. Se inserta en el momento previo a la relacin sexual. El otro anillo forma el extremo abierto del dispositivo. Permanece fuera del canal despus de la insercin.   La pareja masculina no debe usar un condn al [employer]. Podran adherirse entre s y romperse. El condn masculino es la mejor eleccin.   El condn femenino no se deteriora con lubricantes con base de aceites.   Puede insertarse hasta ocho horas antes del momento de [employer].   Puede comprarse sin prescripcin mdica.   Desventajas:   Una de cada cinco mujeres puede quedar embarazada con un condn femenino. No es tan efectivo como el condn masculino de ltex en la prevencin del sida.   El lubricante no contiene espermicida para IT trainer.   Es difcil colocarlo dentro de la vagina y hace ruido durante la relacin sexual.   El anillo interno puede causar molestias.   Puede causar infeccin del tracto urinario si se deja en la vagina durante un tiempo prolongado.   El porcentaje total de falla es de aproximadamente el 20%.  DIAFRAGMA  El diafragma es un dispositivo de ltex en forma de cpula con leve curvatura. Tiene un mecanismo en forma de resorte en el borde para Banker dentro de la vagina, y cubrir el cuello del tero. Evita el embarazo destruyendo los espermatozoides y actuando como barrera que impide el pasaje del semen al cuello del tero.   La mujer necesitar un examen plvico y [employer] tomen medidas para obtener el tamao correcto del diafragma. Los diafragma mal ajustados pueden causar erosiones vaginales o Chartered loss adjuster.   El mdico le mostrar cmo insertarlo y cmo Public house manager que est en Financial controller.   El diafragma debe insertarse con un espermicida antes de Child psychotherapist  sexuales. El gel o la crema espermicida se aplica en el interior del diafragma. Este cubre el cuello del tero.   Pollyann Savoy en su lugar, el diafragma ofrece anticoncepcin efectiva durante seis horas. Luego ese tiempo, si no lo ha retirado, agregue espermicida fresco con Journalist, newspaper.   Despus de la relacin sexual, el diafragma debe dejarse su lugar durante al menos 6 horas pero no ms de 24  horas.Melina Schools slo lubricantes a base de agua.   Slo use un jabn suave y agua tibia para limpiarlo. El diafragma puede tener olor si no se limpia adecuadamente. No utilice talco para secarlo despus de limpiarlo. El talco puede causar cncer de ovario.   Obsrvelo a contraluz con frecuencia para controlar si tiene agujeros.   La efectividad del diafragma depende de la edad y la experiencia del que lo [place]. El porcentaje de fracasos dentro del primer ao es de alrededor del 20%, y el 15% con el uso experimentado.   El uso prolongado durante mltiples actos sexuales puede aumentar el riesgo de infeccin urinaria.   No se recomienda el uso por ms de 24 horas debido al posible riesgo de infecciones del tracto urinario y sndrome de shock txico.   Este mtodo no protege contra las enfermedades de transmisin sexual.  Debe realizar nuevamente la medicin para un nuevo diafragma si usted:  Ha tenido un beb.   Ha sufrido un aborto.   Tuvo una ciruga en la regin pelviana.   Aument o perdi 10 libras o ms.   Tiene infecciones urinarias frecuentes.   Usted o su compaero sexual tienen alguna molestia durante las [employer].   Debe reemplazarse [employer].  CAPUCHN CERVICAL  El capuchn cervical es un positivo blando de ltex o plstico con forma de copa que se coloca sobre la base del cuello del tero.   Debe ser adaptado por el mdico. El motivo es que viene en diferentes tamaos.   Es ms pequeo que el diafragma.   Proporciona proteccin anticonceptiva continua mientras se encuentre en su lugar independientemente del nmero de Clinical research associate.   No es necesario agregar espermicida en los repetidos usos.   No requiere el uso de hormonas. Tambin se [place] espermicida para llenar un tercio del capuchn antes de la insercin.   Debe colocarse ocho horas antes la actividad sexual puede dejarse en su lugar hasta 48 horas.   Existe el riesgo de sndrome  de shock txico si se deja en el lugar ms de 48 horas.   La efectividad depende de la historia de cada parto. Los partos CIGNA forma del cuello del tero. El porcentaje de fracaso es de alrededor del 30% despus de Warehouse manager un beb y del 15% si nunca ha tenido un beb.   Puede ser ms difcil insertarlo que el diafragma.   La erosin cervical puede manifestarse como una pequea prdida vaginal.   Los casos graves de obesidad pueden Primary school teacher.   Las mujeres deben tener una historia clnica con un Papanicolaou normal.   Este mtodo reduce el riesgo pero no protege contra las enfermedades de transmisin sexual   Utilice slo lubricantes a base de [place].   Controle con frecuencia que no tenga agujeros.   Debe reemplazarse una vez por ao.  Debe realizar nuevamente la medicin para un nuevo capuchn cervical si usted  Tuvo un beb   Ha tenido un aborto.   Fue sometida a [place] del cuello uterino.  Siente molestias durante la relacin sexual.   Aument o perdi 10 libras o ms.  ESPONJAS  La esponja es un dispositivo suave de poliuretano de forma circular que contiene un espermicida.   Antes de insertarla debe estar completamente mojada con agua.   La esponja se inserta dentro de la vagina y se [place] sobre el cuello del tero.   La espuma de poliuretano est diseada para atrapar y Loss adjuster, chartered semen antes que los espermatozoides entren en el cuello del tero. El espermicida destruye o inmoviliza los espermatozoides. Tiene una presilla de polister para poder ser retirada.   Son de [place].   No implica el uso de hormonas.   Permite la inmediata y continua accin del espermicida a lo largo de 24 horas.   Sigue siendo efectivo si se repite el acto sexual Community education officer.   La esponja debe dejarse en el lugar durante al menos 6 horas despus de Management consultant. Debe retirarse despus de no ms de 24 horas despus de la insercin debido  al riesgo de sndrome de shock txico.   Debe descartarse luego de su uso.   El porcentaje de fracaso es de aproximadamente 25% pero si se utiliza de manera correcta y consistente puede fracasar en un 10% de los [place].   Los riesgos graves son raros. Incluye irritacin, reacciones alrgicas y dificultad para retirarlo.   Puede causar infecciones por hongos e infecciones del tracto urinario.   Este mtodo no protege contra las enfermedades de transmisin sexual..  ESPERMICIDAS  Los espermicidas contienen una sustancia qumica que [employer] espermatozoides o los vuelve inactivos.   Vienen en forma de tabletas, gel, crema, espumas o como pequeas lminas (film).   Se coloca en la vagina con un aplicador justo antes de la relacin sexual.   Las lminas y las tabletas se insertan entre 10 y 30 minutos antes de tener relaciones sexuales para que se disuelvan.   Son fciles de usar, econmicos y no necesita prescripcin mdica.   Si queda embarazada usando un espermicida, esto no ocasionar defectos congnitos.   Debe volver a aplicar el espermicida antes de cada encuentro sexual.   Si lo [place] con mucha frecuencia, puede desarrollar una reaccin alrgica.   Aumenta el riesgo de infecciones vaginales y del tracto urinario.   No protege contra las enfermedades de transmisin sexual.   El porcentaje total de fracaso es de aproximadamente el 30%. Los espermicidas son ms efectivos cuando se [place] con otros mtodos de Warehouse manager.  El mdico podr ayudarla a decidir cul es la forma de control de la natalidad que ms le [place]. Todas las formas son mejores que el azar. Siempre tenga en mente los riesgos de contagiarse una enfermedad de transmisin sexual.  Document Released: 06/10/2007 Document Revised: 01/24/2010 [facility] Patient Information 2012 [place], [place].

## 2011-01-13 ENCOUNTER — Emergency Department (INDEPENDENT_AMBULATORY_CARE_PROVIDER_SITE_OTHER)
Admission: EM | Admit: 2011-01-13 | Discharge: 2011-01-13 | Disposition: A | Discharge status: Home / Self Care | Payer: Self-pay | Source: Home / Self Care | Attending: Family Medicine | Admitting: Family Medicine

## 2011-01-13 ENCOUNTER — Encounter (HOSPITAL_COMMUNITY): Payer: Self-pay

## 2011-01-13 DIAGNOSIS — J069 Acute upper respiratory infection, unspecified: Secondary | ICD-10-CM

## 2011-01-13 MED ORDER — IPRATROPIUM BROMIDE 0.06 % NA SOLN: 2.0000 | Freq: Four times a day (QID) | NASAL | Status: DC

## 2011-01-13 MED ORDER — DEXTROMETHORPHAN POLISTIREX 30 MG/5ML PO LQCR: 60.0000 mg | ORAL | Status: AC | PRN

## 2011-01-13 MED ORDER — AZITHROMYCIN 250 MG PO TABS: ORAL_TABLET | ORAL | Status: AC

## 2011-01-13 NOTE — ED Notes (Signed)
C/o cough, bilateral ear pain, sore throat and headache for 5 days.

## 2011-01-13 NOTE — ED Notes (Signed)
Discharge note should read that rx for atrovent ( not flonase) nasal spray, azithromycin and delsym were e prescribed.

## 2011-01-13 NOTE — ED Provider Notes (Signed)
History     CSN: [id number]  Arrival date & time 01/13/11  2130   First MD Initiated Contact with Patient 01/13/11 [phone]      Chief Complaint  Patient presents with  . Cough  . Otalgia    (Consider location/radiation/quality/duration/timing/severity/associated sxs/prior treatment) Patient is a 41 y.o. female presenting with cough and ear pain. The history is provided by the patient.  Cough This is a new problem. The current episode started more than 2 days ago. The problem has not changed since onset.The cough is non-productive. Associated symptoms include ear pain, rhinorrhea and sore throat. She is not a smoker.  Otalgia Associated symptoms include rhinorrhea, sore throat and cough.    Past Medical History  Diagnosis Date  . Diabetes mellitus   . Hypertension   . Fatty liver disease, nonalcoholic   . Depression     Past Surgical History  Procedure Date  . Cesarean section     Family History  Problem Relation Age of Onset  . Diabetes Mother   . Diabetes Father   . Cancer Paternal Grandmother     History  Substance Use Topics  . Smoking status: Never Smoker   . Smokeless tobacco: Never Used  . Alcohol Use: No    OB History    Grav Para Term Preterm Abortions TAB SAB Ect Mult Living   4 3 3  0 1  1 0 0 3      Review of Systems  Constitutional: Negative.   HENT: Positive for ear pain, congestion, sore throat, rhinorrhea and postnasal drip.   Respiratory: Positive for cough.   Skin: Negative.     Allergies  Review of patient's allergies indicates no known allergies.  Home Medications   Current Outpatient Rx  Name Route Sig Dispense Refill  . ASPIRIN 81 MG PO TABS Oral Take 81 mg by mouth daily. Patient agreed to start over the counter 81 mg aspirin daily    . WAVESENSE PRESTO PRO METER DEVI  Use to check blood sugar up to 2 times daily     . FLUOXETINE HCL 20 MG PO CAPS Oral Take 1 capsule (20 mg total) by mouth daily. 30 capsule 2  . GLUCOSE BLOOD  VI STRP  Use to test blood sugar up to 2 times daily     . INSULIN GLARGINE 100 UNIT/ML [place] SOLN Subcutaneous Inject 32 Units into the skin daily. To control blood sugars    . INSULIN SYRINGE-NEEDLE U-100 31G X 5/16" 0.5 ML MISC  Use to inject insulin once daily     . LANCETS MISC  Use to check blood sugar as directed     . LISINOPRIL-HYDROCHLOROTHIAZIDE 20-25 MG PO TABS  TAKE ONE TABLET BY MOUTH EVERY DAY 31 tablet 11  . METFORMIN HCL 1000 MG PO TABS  TAKE ONE TABLET BY MOUTH TWICE DAILY 60 tablet 11  . PRENATAL 1 PLUS 1 PO Oral Take 1 tablet by mouth.      . AZITHROMYCIN 250 MG PO TABS  Take as directed on pack 6 each 0  . DEXTROMETHORPHAN POLISTIREX ER 30 MG/5ML PO LQCR Oral Take 10 mLs (60 mg total) by mouth as needed for cough. 89 mL 0  . IPRATROPIUM BROMIDE 0.06 % NA SOLN Nasal Place 2 sprays into the nose 4 (four) times daily. 15 mL 12  . OMEPRAZOLE 20 MG PO CPDR Oral Take 20 mg by mouth daily. Once a day in morning empty stomach       BP  120/83  Pulse 90  Temp(Src) 98.6 F (37 C) (Oral)  Resp 18  SpO2 98%  LMP 11/19/2010  Physical Exam  Nursing note and vitals reviewed. Constitutional: She is oriented to person, place, and time. She appears well-developed and well-nourished.  HENT:  Head: Normocephalic.  Right Ear: External ear normal.  Left Ear: External ear normal.  Mouth/Throat: Oropharynx is clear and moist.  Eyes: Pupils are equal, round, and reactive to light.  Neck: Normal range of motion. Neck supple.  Cardiovascular: Normal rate and normal heart sounds.   Pulmonary/Chest: Effort normal and breath sounds normal.  Neurological: She is alert and oriented to person, place, and time.  Skin: Skin is warm and dry.    ED Course  Procedures (including critical care time)  Labs Reviewed - No data to display No results found.   1. URI (upper respiratory infection)       MDM          Barkley Bruns, MD 01/13/11 1043

## 2011-01-30 ENCOUNTER — Other Ambulatory Visit: Payer: Self-pay | Admitting: *Deleted

## 2011-01-30 MED ORDER — INSULIN GLARGINE 100 UNIT/ML ~~LOC~~ SOLN: 32.0000 [IU] | Freq: Every day | SUBCUTANEOUS | Status: DC

## 2011-02-02 NOTE — Telephone Encounter (Signed)
Prescription was called to the [facility]. 02/02/2011 10:06 AM

## 2011-02-13 ENCOUNTER — Encounter: Payer: Self-pay | Admitting: *Deleted

## 2011-03-02 ENCOUNTER — Encounter: Payer: Self-pay | Admitting: Internal Medicine

## 2011-03-02 ENCOUNTER — Ambulatory Visit (INDEPENDENT_AMBULATORY_CARE_PROVIDER_SITE_OTHER): Payer: Self-pay | Admitting: Internal Medicine

## 2011-03-02 VITALS — BP 108/74 | HR 83 | Temp 97.0°F | Wt 155.6 lb

## 2011-03-02 DIAGNOSIS — E119 Type 2 diabetes mellitus without complications: Secondary | ICD-10-CM

## 2011-03-02 DIAGNOSIS — F329 Major depressive disorder, single episode, unspecified: Secondary | ICD-10-CM

## 2011-03-02 DIAGNOSIS — H543 Unqualified visual loss, both eyes: Secondary | ICD-10-CM | POA: Insufficient documentation

## 2011-03-02 DIAGNOSIS — I10 Essential (primary) hypertension: Secondary | ICD-10-CM

## 2011-03-02 DIAGNOSIS — Z79899 Other long term (current) drug therapy: Secondary | ICD-10-CM

## 2011-03-02 DIAGNOSIS — E785 Hyperlipidemia, unspecified: Secondary | ICD-10-CM

## 2011-03-02 LAB — GLUCOSE, CAPILLARY: Glucose-Capillary: 157 mg/dL — ABNORMAL HIGH (ref 70–99)

## 2011-03-02 MED ORDER — INSULIN GLARGINE 100 UNIT/ML ~~LOC~~ SOLN: 34.0000 [IU] | Freq: Every day | SUBCUTANEOUS | Status: DC

## 2011-03-02 MED ORDER — FLUOXETINE HCL 20 MG PO CAPS: 20.0000 mg | ORAL_CAPSULE | Freq: Every day | ORAL | Status: DC

## 2011-03-02 MED ORDER — LISINOPRIL-HYDROCHLOROTHIAZIDE 20-25 MG PO TABS: 1.0000 | ORAL_TABLET | Freq: Every day | ORAL | Status: DC

## 2011-03-02 MED ORDER — METFORMIN HCL 1000 MG PO TABS: 1000.0000 mg | ORAL_TABLET | Freq: Two times a day (BID) | ORAL | Status: DC

## 2011-03-02 NOTE — Assessment & Plan Note (Signed)
At goal.  

## 2011-03-02 NOTE — Assessment & Plan Note (Signed)
Will increase lantus by 2 units at this time and follow up in 3 months. Encouraged to lose weight and gave her calorie counting pamphlet today.

## 2011-03-02 NOTE — Progress Notes (Signed)
  Subjective:    Patient ID: Robin Klein, female    DOB: 1970/08/15, 41 y.o.   MRN: [id number]  HPI Robin Klein is a 41 year old woman with PMH most significant for diabetes type 2 which is controlled on insulin therapy at this time. She also has obesity, hyperlipidemia and hypertension. Patient also has Non alcoholic steato-hepatosis.  She comes back today for regular followup. After reviewing her diary it seems like her CBGs have been consistently running above 130. Her HBa1c is 7.9.  She has also gained since last office visit.  All her vaccines are up to date.  Patient complains of some difficulty in vision especially when she does not have glasses on. She says that she does not wear her glasses often as she feels they are too strong for her. She saw an ophthalmologist 3 months ago on November/14/12.   Review of Systems  Constitutional: Negative for fever, activity change and appetite change.  HENT: Negative for sore throat.   Eyes:       Hazy vision some times.  Respiratory: Negative for cough and shortness of breath.   Cardiovascular: Negative for chest pain and leg swelling.  Gastrointestinal: Negative for nausea, abdominal pain, diarrhea, constipation and abdominal distention.  Genitourinary: Negative for frequency, hematuria and difficulty urinating.  Neurological: Negative for dizziness and headaches.  Psychiatric/Behavioral: Negative for suicidal ideas and behavioral problems.       Objective:   Physical Exam  Constitutional: She is oriented to person, place, and time. She appears well-developed and well-nourished.  HENT:  Head: Normocephalic and atraumatic.  Eyes: Conjunctivae and EOM are normal. Pupils are equal, round, and reactive to light. No scleral icterus.  Neck: Normal range of motion. Neck supple. No JVD present. No thyromegaly present.  Cardiovascular: Normal rate, regular rhythm, normal heart sounds and intact distal pulses.  Exam reveals no gallop  and no friction rub.   No murmur heard. Pulmonary/Chest: Effort normal and breath sounds normal. No respiratory distress. She has no wheezes. She has no rales.  Abdominal: Soft. Bowel sounds are normal. She exhibits no distension and no mass. There is no tenderness. There is no rebound and no guarding.  Musculoskeletal: Normal range of motion. She exhibits no edema and no tenderness.  Lymphadenopathy:    She has no cervical adenopathy.  Neurological: She is alert and oriented to person, place, and time.  Psychiatric: She has a normal mood and affect. Her behavior is normal.          Assessment & Plan:

## 2011-03-02 NOTE — Assessment & Plan Note (Signed)
Patient does not wear her glasses regularly and I encouraged her to wear them so that she could improve her vision. Her eye exam was reviewed from November which was unremarkable for acute/chronic changes in the retina.

## 2011-03-02 NOTE — Patient Instructions (Signed)
Dieta basada en el recuento de caloras (Calorie Counting Diet) Una dieta basada en el recuento de caloras requiere que consuma la cantidad de caloras que usted necesita durante el da. Las caloras son la medida de la energa que se obtienen de los alimentos que consumimos. Ingerir la cantidad adecuada de caloras es importante para mantener un peso saludable. Si ingiere demasiadas caloras, el organismo las almacenar como grasas y aumentar de peso. Si ingiere pocas caloras, perder peso. El recuento de las caloras que se ingieren durante el da le permitir conocer si recibe la cantidad correcta. Un nutricionista matriculado podr determinar cuntas caloras necesita por da. La cantidad de caloras necesarias vara de una persona a otra. Si su objetivo es perder peso deber consumir menos caloras. Si tiene sobrepeso o tiene problemas de salud como enfermedades cardacas, hipertensin arterial o diabetes, la prdida de algunos kilos lo beneficiar. Si su objetivo es aumentar de peso deber consumir ms caloras. Si tiene problemas de salud que requieran que usted tenga ms energa, puede ser necesario que aumente de peso. CONSEJOS Ya sea que aumente o disminuya el nmero de caloras que consume durante el da, puede ser difcil acostumbrarse a cambiar los hbitos de lo que come o bebe. Los siguientes consejos lo ayudarn a llevar un control del nmero de caloras que consume.  La medicin de los alimentos en el hogar, con una jarra medidora lo ayudar a saber la cantidad real de alimentos y caloras que consume.   En los restaurantes se sirven alimentos en diferentes porciones y tamaos. Es frecuente que en los restaurantes se sirvan los alimentos en cantidades que puedan dividirse en 2 porciones o ms. Al salir a comer afuera, puede ser til estimar cuntas porciones le sirven. Por ejemplo, una porcin a arroz cocido es de  taza y tiene el tamao de la mitad de un puo. Conocer el tamao de las  porciones le ayudar a tener una mejor idea de la cantidad de comida que come en un restaurante.   Pida porciones pequeas o solicite las porciones para nios.   Planifique comer la mitad de una porcin en el restaurante y lleve el resto a su hogar o comprtala con un amigo.   Lea las etiquetas para conocer el contenido de caloras y el tamao de las porciones. La mayora de los alimentos envasados tiene la informacin nutricional en algn lugar del envase. Aqu podr encontrar cuntas porciones contiene el envase, el tamao de la porcin y el nmero de caloras de cada una.   Por ejemplo, digamos que un envase contiene tres galletitas. En la Informacin Nutricional se consigna que una porcin corresponde a una galletita. Debajo, dice que hay tres porciones en el envase. En la seccin de las caloras se informa que contiene 90 caloras. Esto significa que cada galletita tiene 90 caloras. Si come una galletita, habr consumido 90 caloras. Si come tres galletitas, habr consumido tres veces esa cantidad, o sea 270 caloras.  La lista que sigue le mostrar el tamao de algunas porciones comunes.   1 onza.................4 dados apilados   3 onzas...............Un mazo de cartas   1 cucharadita.....La punta de un dedo pequeo   1 cucharada........Un dedo   2 cucharadas......Una pelota de golf    taza..................la mitad de un puo   1 taza...................Un puo  LLEVE UN REGISTRO DE LO QUE COME Escriba todo lo que come, las cantidades y el nmero de caloras en cada comida que haga durante el da. Al final o durante el   da puede ir sumando la cantidad de caloras que ha ingerido. Puede ser de utilidad crear una lista como la que se muestra a continuacin. Conozca la informacin sobre caloras leyendo las etiquetas. Desayuno  Salvado (1 taza, 110 caloras).   Leche descremada ( taza, 45 caloras).  Colacin  Manzana (1 mediana, 80 caloras).  Almuerzo  Espinaca (1 taza,  20 caloras).   Tomate ( mediana, 20 caloras).   Pollo, pechuga (3 onzas, 165 caloras).   Queso shredded cheddar ( taza, 110 calories).   Aderezo italiano diettico (2 cucharadas, 60 caloras).   Pan de trigo integral (1 rebanada, 80 caloras).   Margarina (1 cucharada, 35 caloras).   Sopa de vegetales (1 taza, 160 caloras).  Cena  Chuleta de cerdo (3 onzas, 190 caloras).   Arroz marrn (1 taza, 215 caloras).   Brcoli hervido ( cup, 20 calories).   Fresas (1  taza, 65 caloras).   Crema batida (1 cucharada, 50 caloras).  Total de caloras diarias: 1425 Document Released: 04/09/2008 Document Revised: 09/03/2010 [employer] Patient Information 2012 [employer]. 

## 2011-03-02 NOTE — Assessment & Plan Note (Signed)
Patient currently feels much better. She was cheerful and denied any suicidal, homicidal ideation. She is sleeping well and enjoying activities of daily living.

## 2011-03-02 NOTE — Assessment & Plan Note (Signed)
Last Hba1c was 7 months ago and LDL was 79. repeat in 5 months.

## 2011-04-03 ENCOUNTER — Emergency Department (INDEPENDENT_AMBULATORY_CARE_PROVIDER_SITE_OTHER)
Admission: EM | Admit: 2011-04-03 | Discharge: 2011-04-03 | Disposition: A | Discharge status: Home / Self Care | Payer: Self-pay | Source: Home / Self Care | Attending: Emergency Medicine | Admitting: Emergency Medicine

## 2011-04-03 ENCOUNTER — Encounter (HOSPITAL_COMMUNITY): Payer: Self-pay

## 2011-04-03 DIAGNOSIS — J329 Chronic sinusitis, unspecified: Secondary | ICD-10-CM

## 2011-04-03 HISTORY — DX: Chronic sinusitis, unspecified: J32.9

## 2011-04-03 MED ORDER — FLUTICASONE PROPIONATE 50 MCG/ACT NA SUSP: 2.0000 | Freq: Every day | NASAL | Status: DC

## 2011-04-03 MED ORDER — PSEUDOEPHEDRINE-GUAIFENESIN ER 120-1200 MG PO TB12: 1.0000 | ORAL_TABLET | Freq: Two times a day (BID) | ORAL | Status: DC

## 2011-04-03 MED ORDER — DOXYCYCLINE HYCLATE 100 MG PO CAPS: 100.0000 mg | ORAL_CAPSULE | Freq: Two times a day (BID) | ORAL | Status: AC

## 2011-04-03 NOTE — Discharge Instructions (Signed)
Take the medication as written. Make sure you finish the antibiotics even if you feel better.  Return if you get worse, have a fever >100.4, or for any concerns. You may take 600 mg of motrin with 1 gram of tylenol up to 4 times a day as needed for pain. This is an effective combination for pain. Use a neti pot or the NeilMed sinus rinse as often as you want to to reduce nasal congestion. Follow the directions on the box.   Go to www.goodrx.com to look up your medications. This will give you a list of where you can find your prescriptions at the most affordable prices.   Sinusitis (Sinusitis) Los senos paranasales son bolsas de aire que se encuentran dentro de los huesos de la cara. La aparicin de bacterias en los senos paranasales lleva a una infeccin. Esta se denomina sinusitis.Estas infecciones generalmente son el resultado de una obstruccin en los orificios que [employer] senos.  SNTOMAS Generalmente, segn que seno se infecte, habr diferentes reas en las que Special educational needs teacher.   Los senos maxilares generalmente producen dolor detrs de los ojos.   La sinusitis frontal ocasiona dolor en el medio de la frente y Berthold ojos.  Entre otros problemas (sntomas) se incluyen:  Dolor en la zona posterior de los dientes superiores.   Una secrecin similar al pus (purulenta) proveniente de la nariz.   Toda inflamacin, calor o sensibilidad en estas mismas reas son indicios de infeccin.  TRATAMIENTO La sinusitis se diagnostica a travs del examen fsico y radiografas. Si le han tomado radiografas, asegrese de [employer]. O consulte el modo en que podr obtenerlos. Su mdico le prescribir medicamentos (antibiticos). Estos medicamentos se indican para combatir la infeccin. Tambin le prescribir un descongestivo para reducir la inflamacin del seno paranasal.  INSTRUCCIONES PARA EL CUIDADO DOMICILIARIO  Utilice los medicamentos de venta libre o de prescripcin para Water quality scientist, el malestar o la [place], segn se lo indique el profesional que lo asiste.   Beba gran cantidad de lquidos. Los lquidos ayudan a que las mucosas de los senos nasales drenen ms fcilmente.   Aplique bolsas de calor hmedo o hielo en las zonas ms doloridas para [employer].   Utilice Sprays nasales salinos para ayudar a humedecer los senos nasales. Estos pueden encontrarse en la farmacia local.  SOLICITE ATENCIN MDICA DE INMEDIATO SI:  Lance Muss.   Dolor en aumento, dolor de cabeza intenso o dolor de dientes.   Nuseas, vmitos o sudoracin.   Hinchazn infrecuente alrededor del rostro o problemas en la visin.  EST SEGURO QUE:   Comprende las instrucciones para el alta mdica.   Controlar su enfermedad.   Solicitar atencin mdica de inmediato segn las indicaciones.  Document Released: 10/01/2004 Document Revised: 12/11/2010 [facility] Patient Information 2012 [place], [place].

## 2011-04-03 NOTE — ED Notes (Signed)
Per patient, she has been having ST, cough, ear pain, for past few days; has already used penicillin 500 mg x 1 week, and is continuing to have pain; LMP 2-15, menses are always irregular

## 2011-04-06 NOTE — ED Provider Notes (Signed)
History     CSN: [id number]  Arrival date & time 04/03/11  0865   First MD Initiated Contact with Patient 04/03/11 1209      Chief Complaint  Patient presents with  . Sore Throat    (Consider location/radiation/quality/duration/timing/severity/associated sxs/prior treatment) HPI Comments: Pt with nasal congestion, postnasal drip, ST, nonproductive cough, maxillary sinus pain/pressure worse with bending forward/lying down for a week, ear fullness. No fevers, N/V, other HA, bodyaches, dental pain, purulent nasal d/c.  Has been taking a leftover prescription of penicillin, 500 mg once a day for the past week without relief .     Patient is a 41 y.o. female presenting with sinusitis. The history is provided by the patient. No language interpreter was used.  Sinusitis  This is a new problem. The current episode started more than 1 week ago. The problem has not changed since onset.There has been no fever. Associated symptoms include congestion, ear pain and sinus pressure. Pertinent negatives include no chills, no sweats, no swollen glands and no cough. She has tried other medications for the symptoms. The treatment provided no relief.    Past Medical History  Diagnosis Date  . Diabetes mellitus   . Hypertension   . Fatty liver disease, nonalcoholic   . Depression   . Sinusitis     Past Surgical History  Procedure Date  . Cesarean section     Family History  Problem Relation Age of Onset  . Diabetes Mother   . Diabetes Father   . Cancer Paternal Grandmother     History  Substance Use Topics  . Smoking status: Never Smoker   . Smokeless tobacco: Never Used  . Alcohol Use: No    OB History    Grav Para Term Preterm Abortions TAB SAB Ect Mult Living   4 3 3  0 1  1 0 0 3      Review of Systems  Constitutional: Negative for chills.  HENT: Positive for ear pain, congestion and sinus pressure.   Respiratory: Negative for cough.     Allergies  Review of patient's  allergies indicates no known allergies.  Home Medications   Current Outpatient Rx  Name Route Sig Dispense Refill  . IBUPROFEN 600 MG PO TABS Oral Take 600 mg by mouth every 6 (six) hours as needed.    . ASPIRIN 81 MG PO TABS Oral Take 81 mg by mouth daily. Patient agreed to start over the counter 81 mg aspirin daily    . WAVESENSE PRESTO PRO METER DEVI  Use to check blood sugar up to 2 times daily     . DOXYCYCLINE HYCLATE 100 MG PO CAPS Oral Take 1 capsule (100 mg total) by mouth 2 (two) times daily. X 7 days 14 capsule 0  . FLUOXETINE HCL 20 MG PO CAPS Oral Take 1 capsule (20 mg total) by mouth daily. 30 capsule 5  . FLUTICASONE PROPIONATE 50 MCG/ACT NA SUSP Nasal Place 2 sprays into the nose daily. 16 g 0  . GLUCOSE BLOOD VI STRP  Use to test blood sugar up to 2 times daily     . INSULIN GLARGINE 100 UNIT/ML [place] SOLN Subcutaneous Inject 34 Units into the skin daily. To control blood sugars 10 mL 11  . INSULIN SYRINGE-NEEDLE U-100 31G X 5/16" 0.5 ML MISC  Use to inject insulin once daily     . LANCETS MISC  Use to check blood sugar as directed     . LISINOPRIL-HYDROCHLOROTHIAZIDE 20-25 MG PO  TABS Oral Take 1 tablet by mouth daily. 31 tablet 11  . METFORMIN HCL 1000 MG PO TABS Oral Take 1 tablet (1,000 mg total) by mouth 2 (two) times daily with a meal. 60 tablet 11  . PRENATAL 1 PLUS 1 PO Oral Take 1 tablet by mouth.      Marland Kitchen PSEUDOEPHEDRINE-GUAIFENESIN ER [phone] MG PO TB12 Oral Take 1 tablet by mouth 2 (two) times daily. 20 each 0    BP 108/73  Pulse 85  Temp(Src) 98.1 F (36.7 C) (Oral)  Resp 16  SpO2 98%  LMP 02/20/2011  Physical Exam  Nursing note and vitals reviewed. Constitutional: She is oriented to person, place, and time. She appears well-developed and well-nourished.  HENT:  Head: Normocephalic and atraumatic. No trismus in the jaw.  Right Ear: Tympanic membrane and ear canal normal.  Left Ear: Tympanic membrane and ear canal normal.  Nose: Mucosal edema and  rhinorrhea present. No epistaxis.  Mouth/Throat: Uvula is midline, oropharynx is clear and moist and mucous membranes are normal. Normal dentition. No oropharyngeal exudate.       Bilateral maxillary sinus tenderness  Eyes: Conjunctivae and EOM are normal. Pupils are equal, round, and reactive to light.  Neck: Normal range of motion. Neck supple.  Cardiovascular: Normal rate, regular rhythm and normal heart sounds.   Pulmonary/Chest: Effort normal and breath sounds normal. No respiratory distress. She has no wheezes. She has no rales.  Abdominal: She exhibits no distension. There is no tenderness. There is no rebound and no guarding.  Musculoskeletal: Normal range of motion.  Lymphadenopathy:    She has no cervical adenopathy.  Neurological: She is alert and oriented to person, place, and time.  Skin: Skin is warm and dry. No rash noted.  Psychiatric: She has a normal mood and affect. Her behavior is normal. Judgment and thought content normal.    ED Course  Procedures (including critical care time)  Labs Reviewed - No data to display No results found.   1. Sinusitis      MDM  Has been taking penicillin 500 mg once a day for the past week. Will start doxycycline in addition to flonase, mucinex-d, saline nasal irrigation, increase fluids, tylenol/motrin prn pain. Discussed MDM and plan with pt. Pt agrees with plan and will f/u with PMD prn.    Luiz Blare, MD 04/06/11 2129

## 2011-06-08 IMAGING — US US OB TRANSVAGINAL
1 series · 14 of 28 positions shown · non-contrast
Comparison: none

OBSTETRICAL ULTRASOUND:
 This ultrasound exam was performed in the [HOSPITAL] Ultrasound Department.  The OB US report was generated in the AS system, and faxed to the ordering physician.  This report is also available in [REDACTED] PACS.

[Series 1: us ob transvaginal · 14 of 29 slices shown]
[im 2/29]
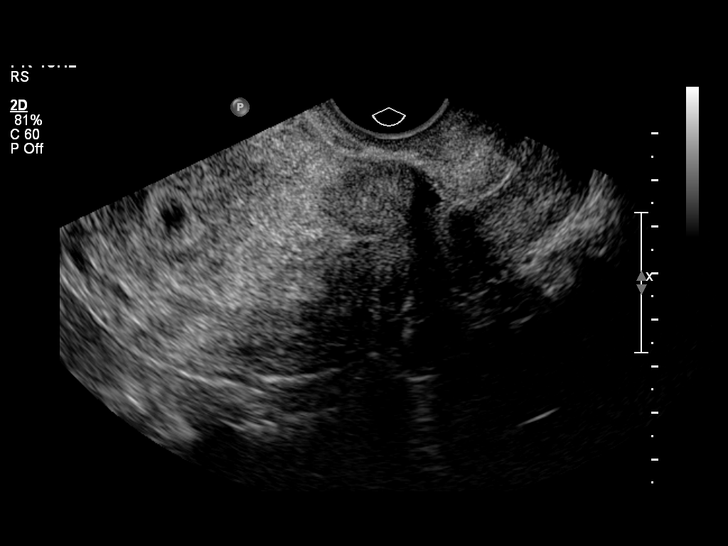
[im 4/29]
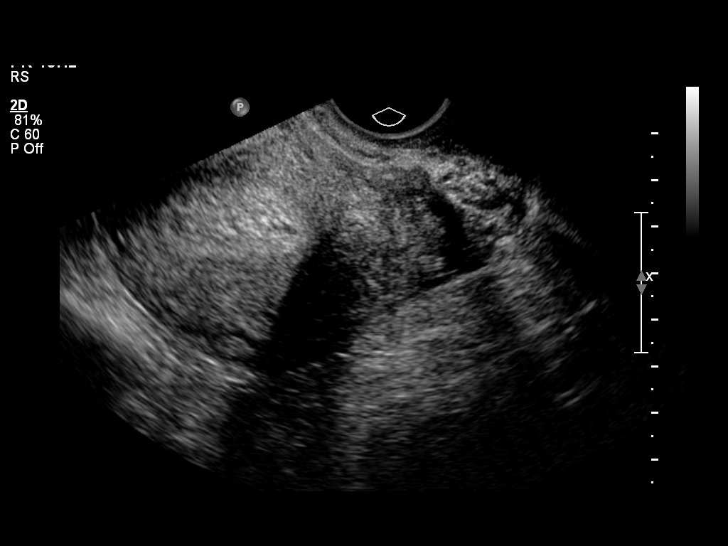
[im 6/29]
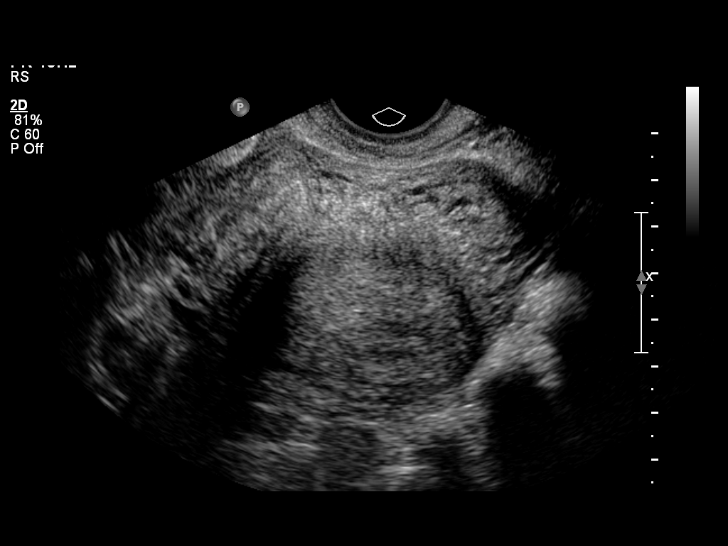
[im 8/29]
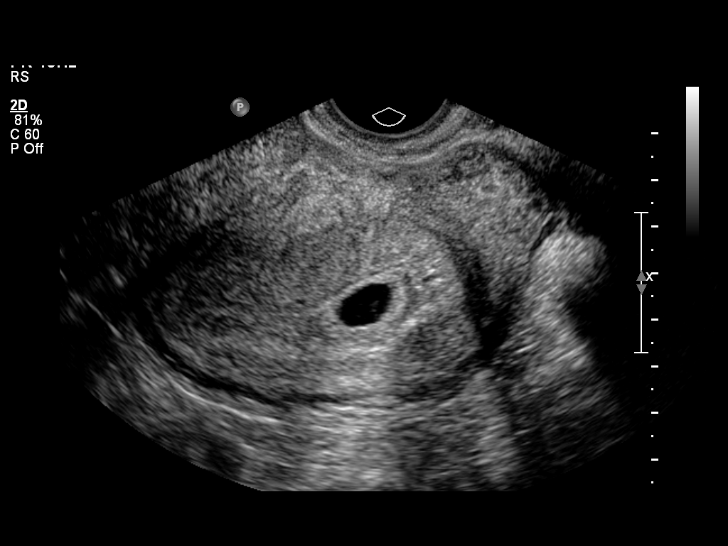
[im 10/29]
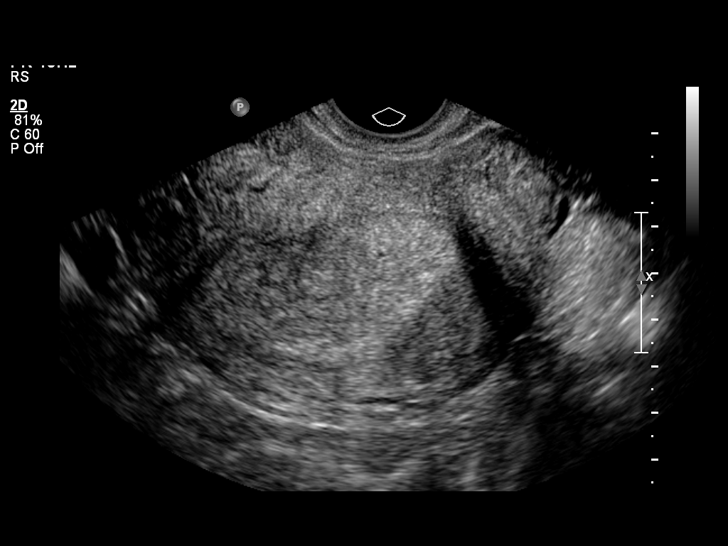
[im 12/29]
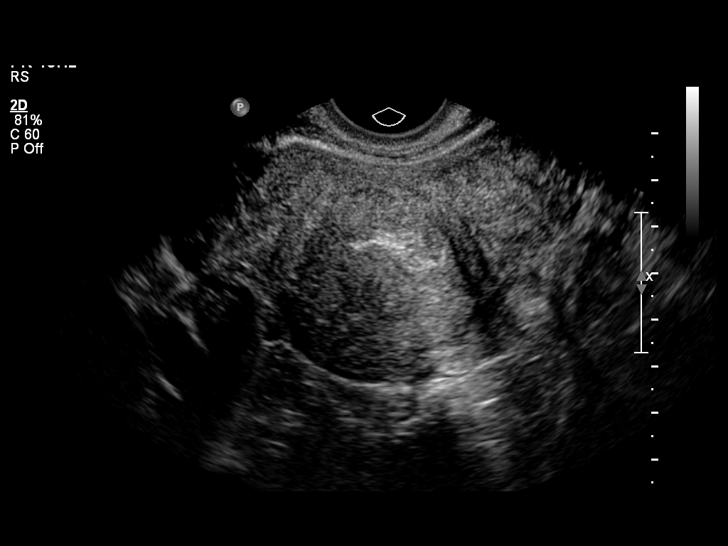
[im 14/29]
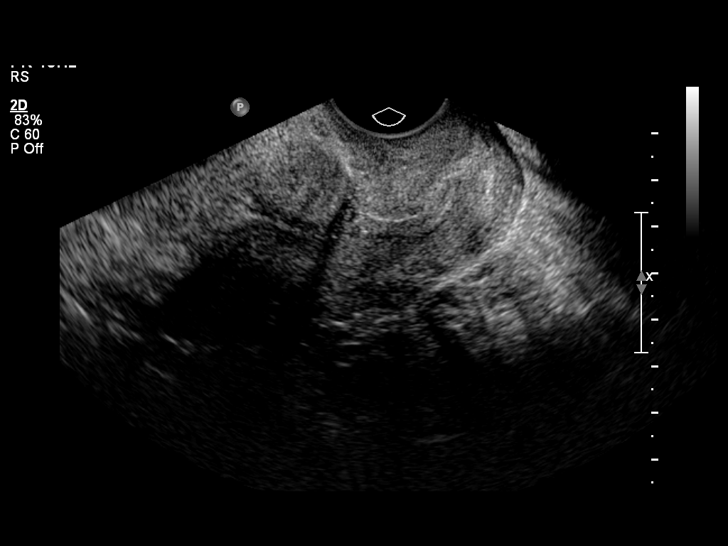
[im 16/29]
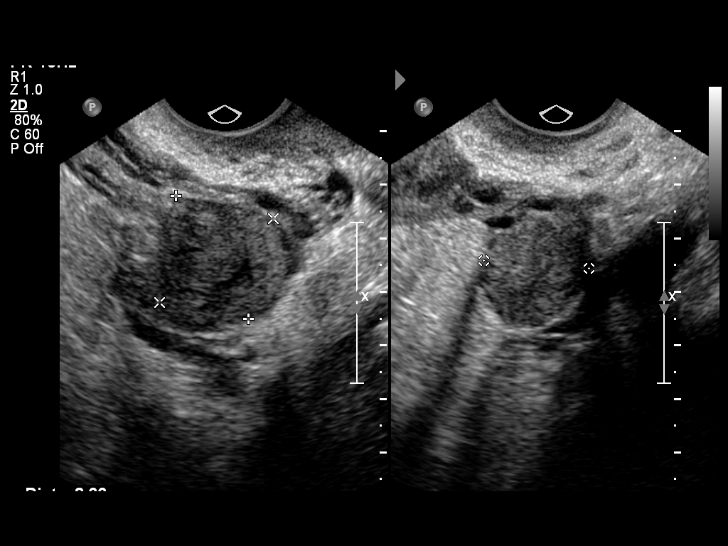
[im 18/29]
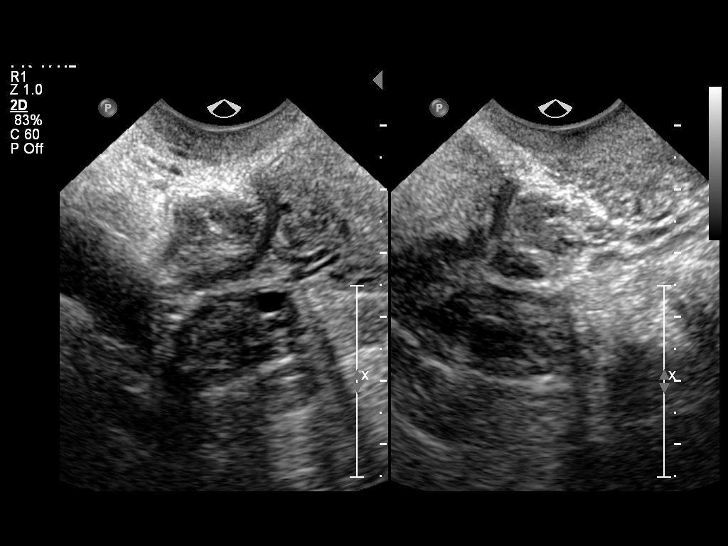
[im 20/29]
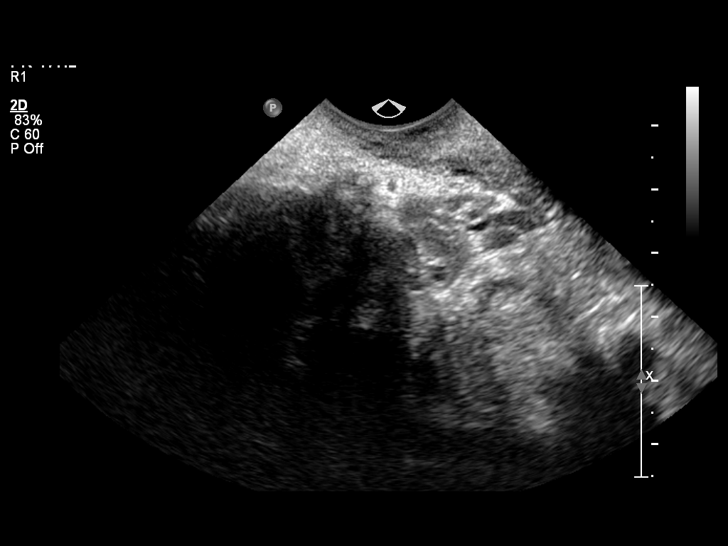
[im 22/29]
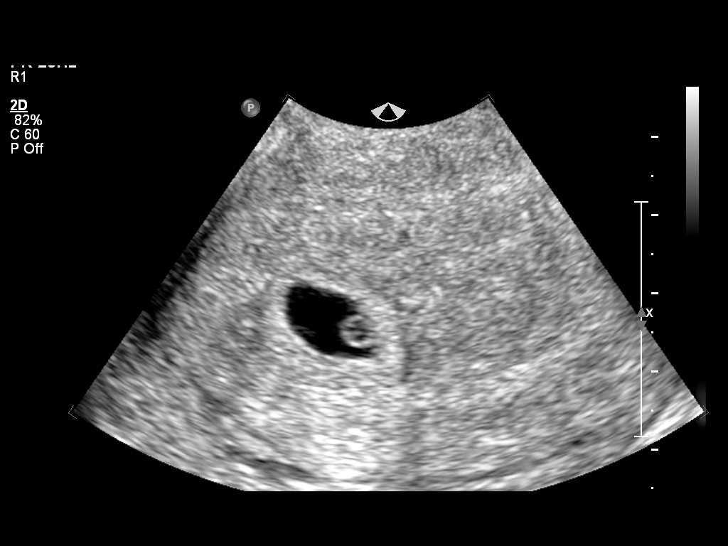
[im 24/29]
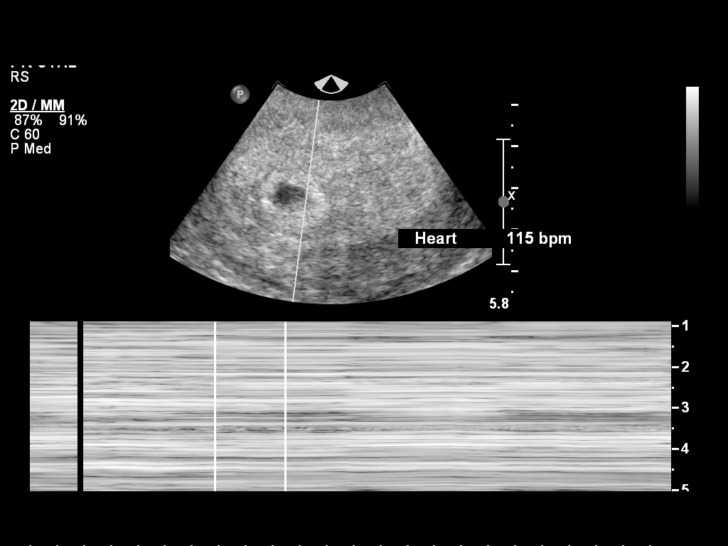
[im 26/29]
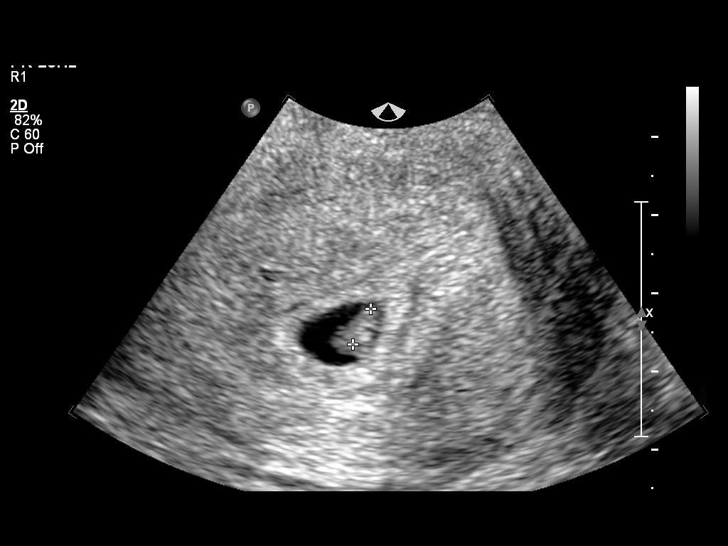
[im 29/29]
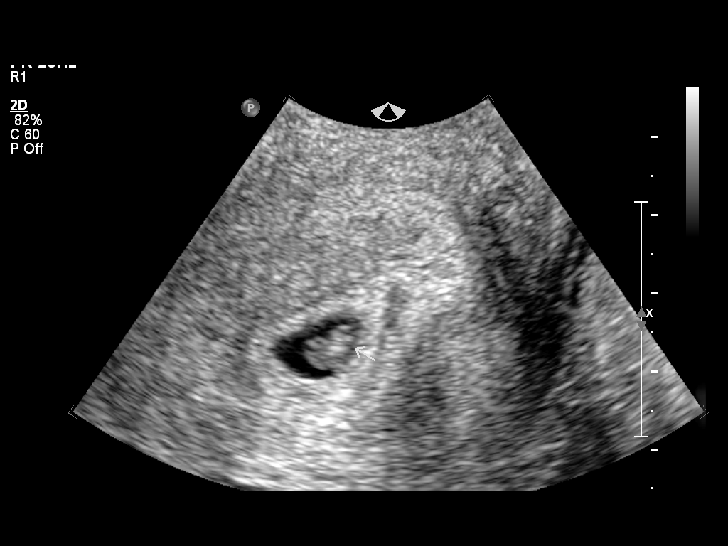

[14 of 28 positions shown; findings below may reference images not displayed]

IMPRESSION: See AS Obstetric US report.

## 2011-06-18 ENCOUNTER — Ambulatory Visit (INDEPENDENT_AMBULATORY_CARE_PROVIDER_SITE_OTHER): Payer: Self-pay | Admitting: Internal Medicine

## 2011-06-18 ENCOUNTER — Encounter: Payer: Self-pay | Admitting: Internal Medicine

## 2011-06-18 VITALS — BP 106/77 | HR 76 | Temp 96.8°F | Ht 60.0 in | Wt 156.4 lb

## 2011-06-18 DIAGNOSIS — K7689 Other specified diseases of liver: Secondary | ICD-10-CM

## 2011-06-18 DIAGNOSIS — K219 Gastro-esophageal reflux disease without esophagitis: Secondary | ICD-10-CM

## 2011-06-18 DIAGNOSIS — I10 Essential (primary) hypertension: Secondary | ICD-10-CM

## 2011-06-18 DIAGNOSIS — Z79899 Other long term (current) drug therapy: Secondary | ICD-10-CM

## 2011-06-18 DIAGNOSIS — E119 Type 2 diabetes mellitus without complications: Secondary | ICD-10-CM

## 2011-06-18 DIAGNOSIS — E785 Hyperlipidemia, unspecified: Secondary | ICD-10-CM

## 2011-06-18 LAB — POCT GLYCOSYLATED HEMOGLOBIN (HGB A1C): Hemoglobin A1C: 9.1

## 2011-06-18 LAB — GLUCOSE, CAPILLARY: Glucose-Capillary: 170 mg/dL — ABNORMAL HIGH (ref 70–99)

## 2011-06-18 MED ORDER — INSULIN GLARGINE 100 UNIT/ML ~~LOC~~ SOLN: 36.0000 [IU] | Freq: Every day | SUBCUTANEOUS | Status: DC

## 2011-06-18 NOTE — Assessment & Plan Note (Signed)
Patient's blood pressure is very well controlled. I will check kidney function today.

## 2011-06-18 NOTE — Progress Notes (Signed)
  Subjective:    Patient ID: Robin Klein, female    DOB: [birth year], 41 y.o.   MRN: [id number]  HPI  Patient is here today for routine followup visit.  Patient complains of discomfort in her chest a few days ago which lasted for about 3-5 days. The discomfort was described as pain which, nonradiating, not associated with exertion, no exacerbating factors, relieved with ibuprofen. The pain has not occurred in last 4-5 days. Patient does not have history of reflux disease. She is currently on aspirin.  She has no other complaints today.    Review of Systems  Constitutional: Negative for fever, activity change and appetite change.  HENT: Negative for sore throat.   Respiratory: Negative for cough and shortness of breath.   Cardiovascular: Negative for chest pain and leg swelling.  Gastrointestinal: Negative for nausea, abdominal pain, diarrhea, constipation and abdominal distention.  Genitourinary: Negative for frequency, hematuria and difficulty urinating.  Neurological: Negative for dizziness and headaches.  Psychiatric/Behavioral: Negative for suicidal ideas and behavioral problems.       Objective:   Physical Exam  Constitutional: She is oriented to person, place, and time. She appears well-developed and well-nourished.  HENT:  Head: Normocephalic and atraumatic.  Eyes: Conjunctivae and EOM are normal. Pupils are equal, round, and reactive to light. No scleral icterus.  Neck: Normal range of motion. Neck supple. No JVD present. No thyromegaly present.  Cardiovascular: Normal rate, regular rhythm, normal heart sounds and intact distal pulses.  Exam reveals no gallop and no friction rub.   No murmur heard. Pulmonary/Chest: Effort normal and breath sounds normal. No respiratory distress. She has no wheezes. She has no rales.  Abdominal: Soft. Bowel sounds are normal. She exhibits no distension and no mass. There is no tenderness. There is no rebound and no guarding.    Musculoskeletal: Normal range of motion. She exhibits no edema and no tenderness.  Lymphadenopathy:    She has no cervical adenopathy.  Neurological: She is alert and oriented to person, place, and time.  Psychiatric: She has a normal mood and affect. Her behavior is normal.          Assessment & Plan:

## 2011-06-18 NOTE — Assessment & Plan Note (Signed)
Patient was advised to take over-the-counter TUMS or Prilosec for episodes of chest pain associated with food.

## 2011-06-18 NOTE — Assessment & Plan Note (Signed)
Patient's HbA1c and CBG was reviewed and given that his HbA1c has increased from 7.9-9.0, I would increase her Lantus from 34 units to 36 units at nighttime. Patient was also advised to lose weight. Currently already on lisinopril. Blood pressure is very well-controlled.  Last eye exam was November of 2012. I will check lipid profile today. kidney function tests today.

## 2011-06-18 NOTE — Assessment & Plan Note (Signed)
I will check comprehensive metabolic profile to followup on her transaminitis. Patient was advised to lose weight.

## 2011-06-18 NOTE — Patient Instructions (Signed)
Ejercicios para perder peso (Exercise to Lose Weight) La actividad fsica y [place] Dear dieta saludable ayudan a perder peso. El mdico podr sugerirle ejercicios especficos. IDEAS Y CONSEJOS PARA HACER EJERCICIOS  Elija opciones econmicas que disfrute hacer , como caminar, andar en bicicleta o los vdeos para ejercitarse.   Utilice las Microbiologist del ascensor.   Camine durante la hora del almuerzo.   Estacione el auto lejos del lugar de [place] o [place].   Concurra a un gimnasio o tome clases de gimnasia.   Comience con 5  10 minutos de actividad fsica por da. Ejercite hasta 30 minutos, 4 a 6 das por [address]   Utilice zapatos que tengan un buen soporte y ropas cmodas.   Elongue antes y despus de Company secretary.   Ejercite hasta que aumente la respiracin y el corazn palpite rpido.   Beba agua extra cuando ejercite.   No haga ejercicio Firefighter, sentirse mareado o que le falte mucho el aire.  La actividad fsica puede quemar alrededor de 150 caloras.  Correr 20 cuadras en 15 minutos.   Jugar vley durante 45 a 60 minutos.   Limpiar y encerar el auto durante 45 a 60 minutos.   Jugar ftbol americano de toque.   Caminar 25 cuadras en 35 minutos.   Empujar un cochecito 20 cuadras en 30 minutos.   Jugar baloncesto durante 30 minutos.   Rastrillar hojas secas durante 30 minutos.   Andar en bicicleta 80 cuadras en 30 minutos.   Caminar 30 cuadras en 30 minutos.   Bailar durante 30 minutos.   Quitar la nieve con una pala durante 15 minutos.   Nadar vigorosamente durante 20 minutos.   Subir escaleras durante 15 minutos.   Andar en bicicleta 60 cuadras durante 15 minutos.   Arreglar el jardn entre 30 y 45 minutos.   Saltar a la soga durante 15 minutos.   Limpiar vidrios o pisos durante 45 a 60 minutos.  Document Released: 03/28/2010 Document Revised: 12/11/2010 [facility] Patient Information 2012 [place], [place].

## 2011-06-18 NOTE — Assessment & Plan Note (Signed)
I will check lipid profile today. 

## 2011-06-19 LAB — LIPID PANEL
HDL: 38 mg/dL — ABNORMAL LOW (ref >39)
LDL Cholesterol: 68 mg/dL (ref 0–99)
Total CHOL/HDL Ratio: 3.9 Ratio
VLDL: 44 mg/dL — ABNORMAL HIGH (ref 0–40)

## 2011-06-19 LAB — TSH: TSH: 1.827 u[IU]/mL (ref 0.350–4.500)

## 2011-06-19 LAB — COMPLETE METABOLIC PANEL WITH GFR
ALT: 51 U/L — ABNORMAL HIGH (ref 0–35)
AST: 38 U/L — ABNORMAL HIGH (ref 0–37)
Albumin: 3.8 g/dL (ref 3.5–5.2)
Alkaline Phosphatase: 99 U/L (ref 39–117)
BUN: 12 mg/dL (ref 6–23)
Calcium: 9.1 mg/dL (ref 8.4–10.5)
Chloride: 103 mEq/L (ref 96–112)
Glucose, Bld: 149 mg/dL — ABNORMAL HIGH (ref 70–99)
Potassium: 3.8 mEq/L (ref 3.5–5.3)
Sodium: 138 mEq/L (ref 135–145)
Total Bilirubin: 0.4 mg/dL (ref 0.3–1.2)
Total Protein: 6.9 g/dL (ref 6.0–8.3)

## 2011-07-20 ENCOUNTER — Encounter (HOSPITAL_COMMUNITY): Payer: Self-pay

## 2011-07-20 ENCOUNTER — Emergency Department (INDEPENDENT_AMBULATORY_CARE_PROVIDER_SITE_OTHER)
Admission: EM | Admit: 2011-07-20 | Discharge: 2011-07-20 | Disposition: A | Discharge status: Home / Self Care | Payer: Self-pay | Source: Home / Self Care

## 2011-07-20 DIAGNOSIS — J069 Acute upper respiratory infection, unspecified: Secondary | ICD-10-CM

## 2011-07-20 LAB — POCT RAPID STREP A: Streptococcus, Group A Screen (Direct): NEGATIVE

## 2011-07-20 MED ORDER — ACETAMINOPHEN 325 MG PO TABS
ORAL_TABLET | ORAL | Status: AC
  Filled 2011-07-20: qty 2

## 2011-07-20 MED ORDER — ACETAMINOPHEN 325 MG PO TABS
650.0000 mg | ORAL_TABLET | Freq: Once | ORAL | Status: AC
  Administered 2011-07-20: 650 mg via ORAL

## 2011-07-20 NOTE — ED Provider Notes (Signed)
Robin Klein is a 41 y.o. female who presents to Urgent Care today for an ear pain for 8 days. Patient developed sore throat worse with coughing and swallowing over the last 8 days. Her children have recently been diagnosed with strep throat. She denies any significant fevers or chills. She has not tried any medications. She's not have any difficulty breathing.  She does note a nonproductive cough as well.   PMH reviewed. Significant for hypertension and diabetes History  Substance Use Topics  . Smoking status: Never Smoker   . Smokeless tobacco: Never Used  . Alcohol Use: No   ROS as above Medications reviewed. Current Facility-Administered Medications  Medication Dose Route Frequency Provider Last Rate Last Dose  . acetaminophen (TYLENOL) tablet 650 mg  650 mg Oral Once Rodolph Bong, MD   650 mg at 07/20/11 1803   Current Outpatient Prescriptions  Medication Sig Dispense Refill  . aspirin 81 MG tablet Take 81 mg by mouth daily. Patient agreed to start over the counter 81 mg aspirin daily      . Blood Glucose Monitoring Suppl (WAVESENSE PRESTO PRO METER) DEVI Use to check blood sugar up to 2 times daily       . FLUoxetine (PROZAC) 20 MG capsule Take 1 capsule (20 mg total) by mouth daily.  30 capsule  5  . fluticasone (FLONASE) 50 MCG/ACT nasal spray Place 2 sprays into the nose daily.  16 g  0  . glucose blood (WAVESENSE PRESTO TEST) test strip Use to test blood sugar up to 2 times daily       . insulin glargine (LANTUS) 100 UNIT/ML injection Inject 36 Units into the skin daily. To control blood sugars  10 mL  11  . Insulin Syringe-Needle U-100 (GNP INSULIN SYRINGE) 31G X 5/16" 0.5 ML MISC Use to inject insulin once daily       . Lancets MISC Use to check blood sugar as directed       . lisinopril-hydrochlorothiazide (PRINZIDE,ZESTORETIC) 20-25 MG per tablet Take 1 tablet by mouth daily.  31 tablet  11  . metFORMIN (GLUCOPHAGE) 1000 MG tablet Take 1 tablet (1,000 mg total) by  mouth 2 (two) times daily with a meal.  60 tablet  11  . Prenatal Vit-Fe Fumarate-FA (PRENATAL 1 PLUS 1 PO) Take 1 tablet by mouth.        . Pseudoephedrine-Guaifenesin (MUCINEX D) [phone] MG TB12 Take 1 tablet by mouth 2 (two) times daily.  20 each  0    Exam:  BP 129/86  Pulse 74  Temp 98.2 F (36.8 C) (Oral)  Resp 20  SpO2 99%  LMP 06/30/2011 Gen: Well NAD HEENT: EOMI,  MMM, normal tympanic membranes bilaterally with posterior pharyngeal erythema without significant exudate Lungs: CTABL Nl WOB Heart: RRR no MRG Exts: Non edematous BL  LE, warm and well perfused.   Results for orders placed during the hospital encounter of 07/20/11 (from the past 24 hour(s))  POCT RAPID STREP A ([facility] URG CARE ONLY)     Status: Normal   Collection Time   07/20/11  5:57 PM      Component Value Range   Streptococcus, Group A Screen (Direct) NEGATIVE  NEGATIVE   No results found.  Assessment and Plan: 41 y.o. female with viral URI. Plan for symptom management with Tylenol or ibuprofen as needed. Recommend followup with primary care doctor if not improving. Discussed warning signs or symptoms. Please see discharge instructions. Patient expresses understanding.  Rodolph Bong, MD 07/20/11 [phone]

## 2011-07-20 NOTE — ED Provider Notes (Signed)
Medical screening examination/treatment/procedure(s) were performed by a resident physician and as supervising physician I was immediately available for consultation/collaboration.  Leslee Home, M.D.   Reuben Likes, MD 07/20/11 2126

## 2011-07-20 NOTE — ED Notes (Addendum)
C/o pain w swallowing, pain w cough for 1 week; 2 of her kids have strep . C/o throat, ears hurt

## 2011-08-25 ENCOUNTER — Encounter: Payer: Self-pay | Admitting: Internal Medicine

## 2011-09-04 ENCOUNTER — Emergency Department (HOSPITAL_COMMUNITY)
Admission: EM | Admit: 2011-09-04 | Discharge: 2011-09-04 | Disposition: A | Discharge status: Home / Self Care | Payer: Self-pay | Source: Home / Self Care | Attending: Emergency Medicine | Admitting: Emergency Medicine

## 2011-09-04 NOTE — ED Notes (Signed)
Pt called in all waiting areas and outside; no answer x 1

## 2011-09-14 ENCOUNTER — Ambulatory Visit (INDEPENDENT_AMBULATORY_CARE_PROVIDER_SITE_OTHER): Payer: Self-pay | Admitting: Internal Medicine

## 2011-09-14 ENCOUNTER — Encounter: Payer: Self-pay | Admitting: Internal Medicine

## 2011-09-14 VITALS — BP 122/87 | HR 66 | Temp 97.4°F | Wt 154.1 lb

## 2011-09-14 DIAGNOSIS — I10 Essential (primary) hypertension: Secondary | ICD-10-CM

## 2011-09-14 DIAGNOSIS — E119 Type 2 diabetes mellitus without complications: Secondary | ICD-10-CM

## 2011-09-14 DIAGNOSIS — G4700 Insomnia, unspecified: Secondary | ICD-10-CM

## 2011-09-14 DIAGNOSIS — F329 Major depressive disorder, single episode, unspecified: Secondary | ICD-10-CM

## 2011-09-14 DIAGNOSIS — Z79899 Other long term (current) drug therapy: Secondary | ICD-10-CM

## 2011-09-14 DIAGNOSIS — K219 Gastro-esophageal reflux disease without esophagitis: Secondary | ICD-10-CM

## 2011-09-14 DIAGNOSIS — Z23 Encounter for immunization: Secondary | ICD-10-CM

## 2011-09-14 LAB — POCT GLYCOSYLATED HEMOGLOBIN (HGB A1C): Hemoglobin A1C: 9.2

## 2011-09-14 MED ORDER — ONDANSETRON HCL 4 MG PO TABS: 4.0000 mg | ORAL_TABLET | Freq: Every day | ORAL | Status: DC | PRN

## 2011-09-14 MED ORDER — LISINOPRIL-HYDROCHLOROTHIAZIDE 20-25 MG PO TABS: 1.0000 | ORAL_TABLET | Freq: Every day | ORAL | Status: DC

## 2011-09-14 MED ORDER — FLUOXETINE HCL 20 MG PO CAPS: 20.0000 mg | ORAL_CAPSULE | Freq: Every day | ORAL | Status: DC

## 2011-09-14 MED ORDER — METFORMIN HCL 1000 MG PO TABS: 1000.0000 mg | ORAL_TABLET | Freq: Two times a day (BID) | ORAL | Status: DC

## 2011-09-14 NOTE — Assessment & Plan Note (Signed)
Well-controlled  at this time 

## 2011-09-14 NOTE — Patient Instructions (Signed)
Dieta para diabticos de 1500 caloras (1500 Calorie Diabetic Diet) Esta dieta para diabticos limita las caloras a 1500 por da. Si sigue esta dieta y un plan de comidas saludable podr mejorar su estado general de salud. Le ayudar a controlar los niveles de Production assistant, radio y a Conservator, museum/gallery presin arterial y los niveles de [place].  PORCIONES La medicin de los alimentos y el tamao de las porciones lo ayudarn a Scientist, physiological cantidad exacta de comida que debe ingerir. La lista que sigue le mostrar el tamao de algunas porciones comunes.   1 onza.................4 dados apilados   3 onzas..............Marland KitchenUn mazo de cartas   1 cucharadita...Marland KitchenMarland KitchenLa punta de un dedo pequeo   1 cucharada.......Marland KitchenUn dedo   2 cucharadas....Marland KitchenMarland KitchenUna pelota de golf    taza..................la mitad de un puo   1 taza..................Marland KitchenUn puo  GUA PARA LA ELECCIN DE LOS ALIMENTOS El objetivo de esta dieta es consumir alimentos variados y Film/video editor la cantidad de caloras a 1500 por Futures trader. Esto puede lograrse eligiendo los alimentos que son bajos en caloras y en grasas. La dieta tambin aconseja consumir porciones pequeas de alimento con frecuencia. Esto ayuda a [employer] de azcar en la sangre de modo que no suba o baje demasiado. Cada alimento o colacin deber incluir una protena para ayudarlo a sentirse ms satisfecho. Consuma aproximadamente la misma cantidad de alimentos a la [employer]. Esto incluye fines de [place], [address]  [address] de viaje y [address] [place] [address] [place]. Coma cada 4 a 5 hs, y aada una colacin entre comidas, si lo desea.  Por ejemplo, un plan diario de comidas incluye el desayuno, una colacin por la maana, almuerzo, cena y [place] colacin por la noche. En las comidas saludables y las colaciones puede incluir una variedad de [employer] se incluyen granos enteros, vegetales, frutas, carnes [place], aves, pescado y productos lcteos. Cuando planee sus comidas, seleccione  una variedad de alimentos. Elija entre panes y fculas, vegetales, frutas, productos lcteos y carnes o protenas. Abajo se enumeran los ejemplos para cada grupo, con las porciones sugeridas. Utilice tazas y cucharas para familiarizarse con el tamao de las porciones. Panes y fculas Cada porcin son 45 gramos de carbohidratos  1 rebanada de pan    de rosquilla    taza de cereal fro (sin azcar)    taza de cereales calientes, pastas cocidas o pur de papas   1 papa pequea (del tamao de un ratn de ordenador)   ? taza de pasta o arroz cocido    bollito tostado tipo ingls   1 taza de caldo   3 tazas de palomitas de maz   4 a 6 rebanadas de pan integral    taza de porotos, arvejas o maz  Vegetales Cada porcin son 5 gramos de carbohidratos   taza de vegetales cocidos   1 taza de vegetales crudos    taza de jugo de tomate o vegetales  Frutas Cada porcin son 15 gramos de carbohidratos  1 manzana o naranja pequea   1  taza de sanda o frutillas    taza de compota de Psychologist, educational (sin agregado de International aid/development worker)   2 cucharadas de pasas de uva    banana    taza de fruta envasada en su propio jugo o en agua    taza de jugo de frutas sin azcar  Lcteos Cada porcin son 12 a 15 gramos de carbohidratos  1 taza de [employer]   6 onzas de yogur endulzado  artificialmente o natural.   1 taza de crema baja en grasas.   1 taza de leche de soja   1 taza de Ava de almendras  Carnes/protenas  1 huevo grande   2 -3 onzas (60 - 80 gr) de carne, ave o pescado.    de taza de queso cottage con bajo contenido de grasa   1 cucharada de [place] de man   1 onza de queso bajo en grasa    de taza de atn al agua    taza de tofu  Grasas  1 cucharadita de aceite   1 cucharadita de margarina baja en grasas   1 cucharadita de mantequilla   1 cucharadita de mayonesa   2 cucharadas de aguacate   1 cucharadita de aderezo para ensaladas   1  cucharadita de queso crema   2 cucharaditas de crema cida  EJEMPLO DE DIETA DE 1500 CALORAS Desayuno   bollo ingls integral (1 porcin de carbohidratos)   1 cucharadita de margarina baja en grasas   1 huevo revuelto   1 taza de [employer] (1 porcin de carbohidratos)   1 naranja pequea (1 porcin de carbohidratos)  Almuerzo  Arrollado de pollo   1 tortilla de trigo integral de 20 cm (1  porcin de carbohidratos)   2 oz (60 gr) de pechuga de pollo en rebanadas   2 cucharadas de aderezo light tipo italiano    taza de lechuga en tiras   2 rebanadas de tomate    taza de zanahorias en bastones   1 manzana pequea (1 porcin de carbohidratos)  Colacin de media tarde  3 galletas graham (1 porcin de carbohidratos)   1 cucharada de mantequilla de man  Cena  2 oz (60 gr,) de costilla de cerdo magra a la parrilla   1 taza de Surveyor, minerals integral (3 porciones de carbohidratos)    taza de zanahorias al vapor    taza de arvejas   1 taza de [employer] (1 porcin de carbohidratos)   1 cucharadita de margarina baja en grasas  Colacin de la noche   taza de queso cottage con bajo contenido de grasa   1 durazno o pera pequeos en rebanadas (o  taza de la fruta envasada en agua) (1 carbohidrato)  PLAN DE COMIDAS Puede utilizar esta hoja para realizar su plan de comidas basndose en las indicaciones para la dieta de 1500 caloras para el diabtico. Si [employer] plan para ayudar a Scientist, physiological glucosa en sangre, podr [employer] carbohidratos de los alimentos (lcteos, fculas y frutas). Seleccione una variedad de alimentos frescos de colores y sabores variados. La cantidad total de carbohidratos en los alimentos es ms importante que asegurarse de incluir todos los grupos alimenticios cada vez que come. Puede elegir aproximadamente muchos de los siguientes alimentos para sus comidas diarias:  6 porciones de fculas.   3 porciones de vegetales.    2 porciones de frutas.   2 porcin de lcteos.   4-6 oz (100 - 200 gr) de carnes/protenas.   Hasta 3 porciones de grasas.  El dietista podr utilizar esta hoja para ayudarlo a decidir cuntas porciones y qu tipos de alimentos son los adecuados para usted. DESAYUNO Grupo de alimentos y porciones / Alimento elegido Fcula__________________________________________________________ Lcteos_________________________________________________________ Lou Miner __________________________________________________________ Carne/protena___________________________________________________ Rosalin Hawking __________________________________________________________ Lorin Mercy de alimentos y porciones / Alimento elegido Fcula_________________________________________________________ Carnes/protenas________________________________________________ Rufina Falco ______________________________________________________ Lou Miner __________________________________________________________ Lcteos_________________________________________________________ Rosalin Hawking _________________________________________________________ Waldo Laine DE MEDIA TARDE Grupo de alimentos y porciones / Alimento elegido Lcteos_________________________________________________________ Fcula__________________________________________________________ Carne/protena___________________________________________________  Fruta ___________________________________________________________ Danford Bad de alimentos y porciones / Alimento elegido Fcula_________________________________________________________ Carnes/protenas________________________________________________ Lcteos________________________________________________________ Rufina Falco ______________________________________________________ Lou Miner __________________________________________________________ Rosalin Hawking _________________________________________________________ Waldo Laine DE LA NOCHE Grupo de  alimentos y porciones / Alimento elegido Fruta __________________________________________________________ Carnes/protenas_________________________________________________ Lcteos_________________________________________________________ Fcula__________________________________________________________ Valorie Roosevelt ________________________________________________________ Vegetales ______________________________________________________ Lou Miner __________________________________________________________ Sheppard Penton ________________________________________________________ Carne/protena __________________________________________________ Rosalin Hawking _________________________________________________________ Document Released: 04/08/2006 Document Revised: 12/11/2010 ExitCare Patient Information 2012 [place], [place].

## 2011-09-14 NOTE — Assessment & Plan Note (Signed)
I have restarted her on Prozac 20 mg daily today.

## 2011-09-14 NOTE — Progress Notes (Signed)
  Subjective:    Patient ID: Robin Klein, female    DOB: 1970-11-01, 41 y.o.   MRN: [id number]  HPI  [name] is a 41 year old female with past medical history most significant for diabetes type 2, hyperlipidemia, hypertension and chronic nonalcoholic liver disease.  Patient is here today for regular follow up.  Patient feels that she is very anxious and is unable to sleep at nighttime. She is constantly thinking about her kids. In addition to difficulty initiating sleep she also has difficulty maintaining sleep and would wake up in the middle of the night with inability to fall asleep again. She does not have elevation in her bedroom and usually goes to bed at the same time everyday. She was started on Prozac 20 mg capsule for generalized anxiety which she stopped taking by herself as she did not want to get addicted to it.   Blood pressure is well-controlled at 122/87.  Diabetes is poorly controlled at latest HBa1c at 9.2 today.   Patient also complains that she feels nauseous since 8 in the morning. No fever, chills, vomiting, diarrhea noted. No abdominal pain noted. Last bowel movement and urinary movement was at 8 in the morning.  Medications reviewed.      Review of Systems  Constitutional: Negative for fever, activity change and appetite change.  HENT: Negative for sore throat.   Respiratory: Negative for cough and shortness of breath.   Cardiovascular: Negative for chest pain and leg swelling.  Gastrointestinal: Positive for nausea. Negative for abdominal pain, diarrhea, constipation and abdominal distention.  Genitourinary: Negative for frequency, hematuria and difficulty urinating.  Neurological: Negative for dizziness and headaches.  Psychiatric/Behavioral: Negative for suicidal ideas and behavioral problems. The patient is nervous/anxious.        Objective:   Physical Exam  Constitutional: She is oriented to person, place, and time. She appears  well-developed and well-nourished.  HENT:  Head: Normocephalic and atraumatic.  Eyes: Conjunctivae and EOM are normal. Pupils are equal, round, and reactive to light. No scleral icterus.  Neck: Normal range of motion. Neck supple. No JVD present. No thyromegaly present.  Cardiovascular: Normal rate, regular rhythm, normal heart sounds and intact distal pulses.  Exam reveals no gallop and no friction rub.   No murmur heard. Pulmonary/Chest: Effort normal and breath sounds normal. No respiratory distress. She has no wheezes. She has no rales.  Abdominal: Soft. Bowel sounds are normal. She exhibits no distension and no mass. There is no tenderness. There is no rebound and no guarding.  Musculoskeletal: Normal range of motion. She exhibits no edema and no tenderness.  Lymphadenopathy:    She has no cervical adenopathy.  Neurological: She is alert and oriented to person, place, and time.  Psychiatric: She has a normal mood and affect. Her behavior is normal.          Assessment & Plan:

## 2011-09-14 NOTE — Assessment & Plan Note (Signed)
Continue omeprazole at this time.

## 2011-09-14 NOTE — Assessment & Plan Note (Signed)
The patient did not bring her meter today although she admitted to dietary indiscretion. I would continue her on current medications. Advised her to adhere to a strict diabetic diet and gave her a printout today.

## 2011-09-14 NOTE — Assessment & Plan Note (Signed)
I have asked her to try Tylenol PM 30 minutes before she goes to bed. In addition have asked her to follow a strict routine for sleeping. I have also asked her to start taking Prozac which may help with the anxiety. Followup in one month.

## 2011-10-09 ENCOUNTER — Telehealth: Payer: Self-pay | Admitting: *Deleted

## 2011-10-09 ENCOUNTER — Ambulatory Visit (INDEPENDENT_AMBULATORY_CARE_PROVIDER_SITE_OTHER): Payer: Self-pay | Admitting: Internal Medicine

## 2011-10-09 ENCOUNTER — Encounter: Payer: Self-pay | Admitting: Internal Medicine

## 2011-10-09 VITALS — BP 112/80 | HR 100 | Temp 97.2°F | Ht 60.0 in | Wt 159.1 lb

## 2011-10-09 DIAGNOSIS — R5383 Other fatigue: Secondary | ICD-10-CM

## 2011-10-09 DIAGNOSIS — R51 Headache: Secondary | ICD-10-CM

## 2011-10-09 DIAGNOSIS — K1321 Leukoplakia of oral mucosa, including tongue: Secondary | ICD-10-CM

## 2011-10-09 MED ORDER — MAGIC MOUTHWASH: 5.0000 mL | Freq: Two times a day (BID) | ORAL | Status: DC

## 2011-10-09 NOTE — Telephone Encounter (Signed)
Pt and husband came to clinic this AM - since 10/04/11 c/o of h/a, nausea, lips black, tongue white and feels hot. Appt made with Dr Dorise Hiss 10/09/11 1:15PM. Stanton Kidney Jedadiah Abdallah RN 10/09/11 9:40AM

## 2011-10-09 NOTE — Patient Instructions (Addendum)
Vamos a tener que cambiar su dosis de insulina Lantus a 37 unidades en la noche. Deje de tomar aspirina para 5 das. Si el hormigueo desaparezca, deje de aspirina y no comience a tomarlo. Si el cosquilleo no cambia luego empezar a tomar aspirina de nuevo. Vuelve a ver a su mdico de cabecera en noviembre. Llmenos si tiene problemas o preguntas al [phone].  We will have you change your insulin lantus dose to 37 units at night time. Stop taking aspirin for 5 days. If the tingling goes away, stop aspirin and do not start taking it again. If the tingling does not change then start taking aspirin again. Come back to see your regular doctor in November. Call us if you have problems or questions at [phone].

## 2011-10-09 NOTE — Progress Notes (Addendum)
Subjective:     Patient ID: Robin Klein, female   DOB: December 18, 1970, 41 y.o.   MRN: [id number]  HPI The patient is a 41 year old Spanish-speaking female who comes in today with the interpreter. She is complaining of headache for 5 days. She also states that she feels a little tingling sensation in her lips and tongue. She states subjective problems with swallowing. She's noticed a white plaque on her tongue. She states that she has not have any visual or auditory symptoms from the headache. She does occasionally get headaches in the past. She states she's tried some herbal tea use and how to use them he seemed to do good for the headache. She tries not to take medications for pain and she does not wish to become dependent. She does continue to take her metformin and her Lantus however her sugars have been in the 2 to 300s. She states that she's had a little bit less energy than she normally does recently. No shortness of breath, abdominal pain, nausea, vomiting, excessive urination. No chest pain. She has still been maintaining good oral liquid and food intake. Her 3 children are also present in the room during our visit.  Review of Systems  Constitutional: Positive for fatigue. Negative for fever, chills, diaphoresis, activity change, appetite change and unexpected weight change.  Respiratory: Negative for cough, choking, chest tightness, shortness of breath and wheezing.   Cardiovascular: Negative for chest pain, palpitations and leg swelling.  Gastrointestinal: Negative for nausea, vomiting, abdominal pain, diarrhea, constipation and abdominal distention.  Musculoskeletal: Negative for myalgias, back pain, joint swelling, arthralgias and gait problem.  Skin: Negative.   Neurological: Positive for headaches. Negative for dizziness, tremors, seizures, syncope, facial asymmetry, speech difficulty, weakness, light-headedness and numbness.  Psychiatric/Behavioral: Negative.        Objective:    Physical Exam  Constitutional: She is oriented to person, place, and time. She appears well-developed and well-nourished.       Obese  HENT:  Head: Normocephalic and atraumatic.  Eyes: EOM are normal. Pupils are equal, round, and reactive to light.  Neck: Normal range of motion. Neck supple.  Cardiovascular: Normal rate and regular rhythm.        Borderline tachycardic  Pulmonary/Chest: Breath sounds normal.  Abdominal: Soft. Bowel sounds are normal. She exhibits no distension. There is no tenderness. There is no rebound and no guarding.  Musculoskeletal: Normal range of motion. She exhibits no edema and no tenderness.  Neurological: She is alert and oriented to person, place, and time. No cranial nerve deficit.  Skin: Skin is warm and dry.  Psychiatric: She has a normal mood and affect. Her behavior is normal.       Assessment/Plan:   1. Headache-the patient will continue to use her hot teas or an occasional ibuprofen/tylenol if needed for headache. If these do continue and do not alleviate she may need some kind of headache prophylaxis.  2. Fatigue-suspect that this is related to the patient's uncontrolled sugars. They have been 200-300 at home. Will increase Lantus to 37 units nightly. Would have increased to 38 however the patient did have that experience when she went up too much with Lantus at one time therefore will go up slowly and see her back in one month with her regular doctor.   3. Tongue plaque-suspect that do to increased sugar poor dentition this could be some kind of fungal infection. We'll give her Magic mouthwash to swish and swallow. Advised if symptoms continue or  worsen to call our office. Currently no problems with breathing however some odonophagia. This may be related to tingling on her lips as well. She states she did increase her aspirin from one half tab to 1 tab recently. Advised to stop aspirin for 5 days if symptoms are gone entirely stop aspirin if symptoms  are not changed resume taking aspirin.

## 2011-10-21 ENCOUNTER — Encounter (HOSPITAL_COMMUNITY): Payer: Self-pay

## 2011-10-21 ENCOUNTER — Emergency Department (INDEPENDENT_AMBULATORY_CARE_PROVIDER_SITE_OTHER)
Admission: EM | Admit: 2011-10-21 | Discharge: 2011-10-21 | Disposition: A | Discharge status: Home / Self Care | Payer: Self-pay | Source: Home / Self Care

## 2011-10-21 DIAGNOSIS — B370 Candidal stomatitis: Secondary | ICD-10-CM

## 2011-10-21 MED ORDER — CLOTRIMAZOLE 10 MG MT TROC: 10.0000 mg | Freq: Four times a day (QID) | OROMUCOSAL | Status: DC

## 2011-10-21 NOTE — ED Notes (Signed)
Patient c/o mouth pain, states that her lips are purple, tongue has white patches and it itches

## 2011-10-21 NOTE — ED Provider Notes (Signed)
CC:  Tongue. Lips swollen  Pt is a 41 y.o. Hispanic female with diabetes who presents with mouth pain and swelling for past 4 weeks. Started on her tongue - white patches, then became grooved and red with little cuts. The inside of her mouth and lips then became very red and her lips became very swollen and then bluish color. Her mouth/tongue are now itchy and tingling, somewhat numb. It is still very painful. No swelling or pain in throat. No difficulty breathing.   Was given a mouth rinse at [facility] clinic last week and used it several times a day. It helped some. She also thought the symptoms might be due to a medication so she stopped her aspirin. The only other medication change is that she was taking a combination lisinopril/HCTZ but now is taking them separately (two different pills). Also had nausea 2 weeks ago.  No new toothpaste, foods, or other oral products.  Has had some vaginal itching recently too. Takes Herbal Lite supplements and started a new one 3 months ago for eye health.  PMH: DM, HTN PSH:  c-section Soc:  No smoking, no alc, no drugs Fam Hx:  Non-contributory  Meds:  Metformin, asa, HCTZ, lisinopril, insulin New formulation lis/hctz  No Known Allergies  ROS: as stated in HPI  EXAM: Filed Vitals:   10/21/11 1843  BP: 122/83  Pulse: 81  Temp: 98.8 F (37.1 C)  Resp: 18    GEN:  Obese, no distress HEENT:  NCAT, EOMI, OP clear.  External lower lip slight blue color. Tongue fissured, mild erythema. Buccal mucosa red with white striae/discoloration. Gingiva red. Inner labial mucosa very red. Area inferior to tongue very red with inflamed looking papilla. CV:  RRR,  No murmur RESP:  CTAB EXTREM:  No edema NEURO:  Alert and oriented, no focal deficits.  A/P 41 y.o. Hispanic diabetic female with 1.  Mouth sores/inflammation x 4 weeks, improving.  Likely fungal (candida).  Magic mouthwash prescribed did not have anti-fungal agent. Will try clotrimazole troches.  Differential diagnosis lichen planus. Also has vaginal itching -possible yeast. May need oral diflucan - f/u with PCP. 2. Diabetes - likely poorly controlled.  F/U with PCP. Stable - no anaphylaxis or angioedema. Airway stable.  Napoleon Form, MD  Napoleon Form, MD 10/21/11 2236

## 2011-11-09 ENCOUNTER — Ambulatory Visit (INDEPENDENT_AMBULATORY_CARE_PROVIDER_SITE_OTHER): Payer: Self-pay | Admitting: Internal Medicine

## 2011-11-09 ENCOUNTER — Encounter: Payer: Self-pay | Admitting: Internal Medicine

## 2011-11-09 VITALS — BP 107/74 | HR 76 | Temp 97.0°F | Wt 153.5 lb

## 2011-11-09 DIAGNOSIS — E119 Type 2 diabetes mellitus without complications: Secondary | ICD-10-CM

## 2011-11-09 DIAGNOSIS — B370 Candidal stomatitis: Secondary | ICD-10-CM

## 2011-11-09 MED ORDER — INSULIN GLARGINE 100 UNIT/ML ~~LOC~~ SOLN: 38.0000 [IU] | Freq: Every day | SUBCUTANEOUS | Status: DC

## 2011-11-09 MED ORDER — FLUCONAZOLE 100 MG PO TABS: 100.0000 mg | ORAL_TABLET | Freq: Every day | ORAL | Status: AC

## 2011-11-09 NOTE — Assessment & Plan Note (Signed)
Patient's CBGs are elevated and based on the elevated CBGs I would increase her Lantus by 2 units. Followup in 2 weeks.

## 2011-11-09 NOTE — Progress Notes (Signed)
  Subjective:    Patient ID: Robin Klein, female    DOB: 12/05/1970, 41 y.o.   MRN: [id number]  HPI [name] is 41 year old female with past medical history most significant for diabetes type 2, hypertension, hyperlipidemia, major depression and chronic nonalcoholic liver disease who was seen in the ER for oral thrush and was prescribed Magic mouthwash and clotrimazole tablets. She comes today for a recheck. She says that her tongue hurts and her swallowing is still very painful.    Review of Systems  Constitutional: Negative for fever, activity change and appetite change.  HENT: Positive for mouth sores and trouble swallowing. Negative for sore throat and voice change.   Respiratory: Negative for cough and shortness of breath.   Cardiovascular: Negative for chest pain and leg swelling.  Gastrointestinal: Negative for nausea, abdominal pain, diarrhea, constipation and abdominal distention.  Genitourinary: Negative for frequency, hematuria and difficulty urinating.  Neurological: Negative for dizziness and headaches.  Psychiatric/Behavioral: Negative for suicidal ideas and behavioral problems.       Objective:   Physical Exam  Constitutional: She is oriented to person, place, and time. She appears well-developed and well-nourished.  HENT:  Head: Normocephalic and atraumatic.  Mouth/Throat: Oropharynx is clear and moist. No oropharyngeal exudate.  Eyes: Conjunctivae normal and EOM are normal. Pupils are equal, round, and reactive to light. No scleral icterus.  Neck: Normal range of motion. Neck supple. No JVD present. No thyromegaly present.       No thrush seen on examination  Cardiovascular: Normal rate, regular rhythm, normal heart sounds and intact distal pulses.  Exam reveals no gallop and no friction rub.   No murmur heard. Pulmonary/Chest: Effort normal and breath sounds normal. No respiratory distress. She has no wheezes. She has no rales.  Abdominal: Soft. Bowel  sounds are normal. She exhibits no distension and no mass. There is no tenderness. There is no rebound and no guarding.  Musculoskeletal: Normal range of motion. She exhibits no edema and no tenderness.  Lymphadenopathy:    She has no cervical adenopathy.  Neurological: She is alert and oriented to person, place, and time.  Psychiatric: She has a normal mood and affect. Her behavior is normal.          Assessment & Plan:

## 2011-11-09 NOTE — Patient Instructions (Signed)
Candidiasis bucal en adultos (Thrush, Adult) La candidiasis bucal es una infeccin por hongos que se desarrolla en la boca y la garganta y en la lengua. El trmino mdico es candidiasis orofarngea, o COF. La candidasis es ms comn en adultos mayores, pero puede presentarse a Actuary. La candidasis ocurre cuando una levadura llamada cndida crece fuera de control. Esta levadura se encuentra normalmente presente en pequeas cantidades en la boca y [employer]. Sin embargo, bajo Nurse, mental health, puede crecer rpidamente y causar candidiasis. La candidasis puede ser un problema recurrente en personas que tienen enfermedades crnicas o toman medicamentos que limitan la capacidad del cuerpo de pelear contra las infecciones (es decir, que debilitan el sistema inmunolgico). Dado que estas personas tienen dificultad para pelear contra las infecciones, el hongo que causa la candidiasis puede diseminarse a travs del cuerpo. Esto puede causar infecciones sanguneas o de rganos que pueden poner en peligro la vida. CAUSAS La cndida, la levadura que causa candidiasis, se encuentra normalmente presente en pequeas cantidades en la boca y [employer]. Normalmente no causa dao. Sin embargo, cuando existen condiciones que permiten que la levadura crezca sin control, invade los tejidos que la rodean y se convierte en una infeccin. La candidiasis es causada comnmente por la levadura Candida albicans. Con menor frecuencia puede ser causada por otras formas de cndida. Existen muchos tipos de bacteria en la boca que normalmente controlan el crecimiento de cndida. En ocasiones un nuevo tipo de bacteria ingresa a la boca y [place] el equilibrio de los grmenes presentes. Esto puede permitir un crecimiento excesivo de cndida. Otros factores que aumentan la psobilidad de desarrollar candidiasis incluyen:  Una mala capacidad para pelear contra infecciones (sistema inmunolgico debilitado).  Un sistema inmunolgico normal normalmente es capaz de prevenir un crecimiento excesivo de cndida.  Otros adultos pueden tener mayor probabilidad de desarrollar candidiasis porque tienen un sistema inmunolgico ms dbil.  Las personas con el virus de inmunodeficiencia humano (VIH) tienen una alta probabilidad de desarrollar candidiasis. Alrededor del 90% de las personas con VIH desarrollan candidiasis en algn momento en el transcurso de su enfermedad.  Las personas con diabetes tienen mayor probabilidad de Environmental education officer candidiasis porque los niveles elevados de azcar en sangre promueven el crecimiento de la cndida.  Sequedad en la boca Butch Penny). La sequedad en la boca puede ser resultado del uso excesivo de enjuagues bucales o debido a trastornos como el sndrome de [place].  El [place]. Los cambios hormonales durante el embarazo pueden llevar a la candidiasis debido a la alteracin del equilibro de bacterias en la boca.  Cuidado dental deficiente, especialmente en personas que tienen dientes postizos.  El uso de medicamentos antibiticos. Pueden llevar a la candidiasis debido a la alteracin del equilibro de bacterias en la boca. SNTOMAS La candidiasis puede ser una infeccin leve y no producir sntomas. Si los Hilda, pueden incluir los siguientes:  Sensacin de Therapist, music en la boca y en la garganta. Esto puede ocurrir en el comienzo de la infeccin por candidiasis.  Pequeos parches blancos que se adhieren a la boca y Scientist, product/process development. El tejido que rodea a los parches puede verse rojo e irritado y Patent examiner. Si se lo frota (durante el cepillado de los dientes, por ejemplo), los parches y el tejido de la boca podrn Geophysicist/field seismologist con facilidad.  Mal sabor en la boca o dificultad para saborear alimentos.  Sensacin algodonosa en la boca.  En ocasiones dolor al comer y tragar. DIAGNSTICO El profesional que lo asiste  puede habitualmente diagnosticar esta infeccin examinndolo.  Adems de observar la boca, podr realizarle algunas preguntas relacionadas con su estado de salud. TRATAMIENTO Los medicamentos que previenen el crecimiento de hongos (antimicticos) son el tratamiento estndar para la candidiasis. Estos medicamentos se aplican directamente sobre la zona afectada (tpicos), o bien por va oral. Candidiasis leve En adultos, los casos leves de candidiasis pueden eliminarse con un tratamiento simple que puede Management consultant. Este tratamiento involucra el uso de un enjuague bucal antimictico o pastillas. El tratamiento dura aproximadamente [address] Candidiasis moderada a grave  Los casos ms graves de candidiasis que se han diseminado hacia el esfago se tratan con un medicamento antimictico oral. Tambin puede utilizarse un medicamento antimictico tpico.  Para las infecciones ms graves, puede ser necesario un tiempo de tratamiento mayor a [address]  Los medicamentos antimicticos orales casi nunca se utilizan durante el embarazo porque pueden daar al feto. Sin embargo, si una mujer embarazada tiene una infeccin de candidiasis grave y poco frecuente que se ha diseminado a su sangre, es posible que se deban Chemical engineer medicamentos antimicticos. En [employer], el riesgo de dao para la madre y el feto de una infeccin grave de candidiasis puede ser mayor al riesgo que implica el uso de medicamentos antimicticos. Candidiasis persistente o recurrente Los casos persistentes (que no se va) o recurrentes (que vuelve repetidamente) pueden requerir:  Sales executive [employer], mientras los sntomas [place].  Tratamiento con medicamentos antimicticos tanto orales como tpicos.  Aquellas personas con un sistema inmunolgico debilitado pueden tomar medicamentos antimicticos de manera continua para evitar infecciones por candidiasis. Es importante que trate los trastornos que aumentan la posibilidad de Environmental education officer candidiasis, como la diabetes, el virus  de inmunodeficiencia humano (VIH) y Management consultant.  INSTRUCCIONES PARA EL CUIDADO DOMICILIARIO  Si est amamantando, deber limpiar sus pezones con medicamento antimictico, como nistatina. Seque los pezones despus de Museum/gallery exhibitions officer. Para Engineer, materials que pueda sentir en los pezones, puede aplicar una locin corporal que contenga lanolina.  Si utiliza una dentadura postiza y sufre candidiasis, qutese la dentadura antes de [place], lvela vigorosamente, y sumrjala en una solucin de gluconato de clorhexidina o en algn producto como Polident o Efferdent.  El yogur corriente sin sabor con Theatre stage manager (verifique la etiqueta) puede ayudar a curar la candidiasis. El yogur ayuda a que crezcan bacterias saludables en la boca. Estas bacterias detienen el crecimiento del hongo que causa candidiasis.  Los adultos pueden tratar la candidiasis en su hogar con violeta de genciana (1%), una tintura que mata las bacterias y los hongos. Se puede adquirir sin prescripcin. Si no existe una causa evidente para la infeccin o si la violeta de genciana no cura la candidiasis, deber ver a su mdico. Medidas para el bienestar Se pueden tomar ciertas medidas para reducir las [employer] produce la candidiasis.  Consuma bebidas fras como agua o t helado. Consuma helados o paletas heladas.  Consuma alimentos que sean fciles de tragar como gelatina, cremas heladas, o natilla.  Si los parches son dolorosos, intente beber con una pajilla.  Enjuague su boca varias veces al da con un enjuague de agua salada tibia. Puede hacer esta mezcla con 1 cucharadita (5 g) de sal en 8 onzas fludas (0,2 L) de agua tibia. PRONSTICO  La mayor parte de los casos de candidiasis se curan con el uso de enjuagues bucales o pastillas antimicticas. Los casos muy leves de candidiasis pueden curarse sin tratamiento mdico. Normalmente lleva alrededor de 14  das de tratamiento con medicamento oral antimictico para curar las  infecciones ms graves. En algunos casos, la candidiasis puede durar varias semanas, an con tratamiento.  Si la candidiasis no se trata y no se va por s sola, puede diseminarse a otras partes del cuerpo.  La candidiasis puede diseminarse hacia la garganta, la vagina, o la piel. Es muy poco frecuente que se extienda Normajean [place] otros rganos del cuerpo. La candidiasis es ms probable que se repita en:  Personas que utilizan inahaladores de corticosteroides para tratar el asma.  Personas que toman medicamentos antibiticos durante [employer].  Personas que tienen dientes postizos.  Personas que tienen un sistema inmunolgico debilitado. RIESGOS Y COMPLICACIONES  Las complicaciones relacionadas con la candidiasis son poco frecuentes en personas saludables. Existen varios factores que aumentan la posibilidad de desarrollar candidiasis. La edad Los [address], especialmente aquellos con problemas graves de Brutus, tienen mayor probabilidad de Environmental education officer candidiasis ya que su sistema inmunolgico puede ser ms dbil. Conducta  El hongo que causa la candidiasis puede contagiarse por sexo oral.  Fumar mucho puede disminuir la capacidad del cuerpo de luchar contra infecciones. Esto hace que sea ms probable que se desarrolle candidiasis. Otros trastornos  [employer], aparatos de ortodoncia, o retenedores dentales que irriten la boca hacen que sea ms difcil mantener su limpieza. Si no se mantiene una buena higiene bucal es ms probable que se desarrolle candidiasis.  Las personas con un sistema inmunolgico debilitado, Development worker, community con diabetes o el virus de inmunodeficiencia humano (VIH), o quienes estn siendo tratados con quimioterapia, tienen un mayor riesgo de desarrollar candidiasis. Medicamentos Algunos medicamentos permiten que el hongo que causa candidiasis crezca sin control. Los ms comunes son:  Antibiticos, especialmente aquellos que matan una amplia variedad de  organismos (antibiticos de amplio espectro), como la tetraciclina, a menudo causan candidiasis.  Pldoras para el control de la natalidad (anticonceptivos orales).  Medicamentos que debilitan el sistema inmunolgico del cuerpo, como corticosteroides. Ambiente La exposicin extendida en el tiempo a ciertos qumicos ambientales, como benceno y pesticidas, pueden debilitar el sistema inmunolgico del cuerpo. Esto aumenta la probabilidad de desarrollar infecciones, como la candidiasis. SOLICITE ATENCIN MDICA DE INMEDIATO SI:  Sus sntomas empeoran o no mejoran luego de 7 das de Microbiologist.  Tiene sntomas de expansin de la infeccin, como parches blancos en la piel por fuera de la boca.  Est amamantando y observa enrojecimiento y Engineer, mining en los pezones a pesar del tratamiento que realiz en su hogar, o si siente ardor en el pezn cuando amamanta. Tambin se debe examinar la boca del beb para determinar si efectivamente es candidiasis lo que causa estos sntomas. Document Released: 04/09/2008 Document Revised: 03/16/2011 [facility] Patient Information 2013 [place], [place].

## 2011-11-09 NOTE — Assessment & Plan Note (Signed)
Patient has had this for about a month now. Although patient has had been a diabetic but her CBGs are fairly controlled. Poor control of diabetes can cause oral thrush at times but I would check HIV in case she has immunosuppression from HIV as a cause of her thrush. I will prescribe patient fluconazole 200 mg loading dose followed by a total of 7 days of therapy for treatment of moderate thrush as recommended by up to date. Pain control with Tylenol and Advil over-the-counter.

## 2011-11-24 ENCOUNTER — Encounter: Payer: Self-pay | Admitting: Internal Medicine

## 2011-11-24 ENCOUNTER — Ambulatory Visit (INDEPENDENT_AMBULATORY_CARE_PROVIDER_SITE_OTHER): Payer: Self-pay | Admitting: Internal Medicine

## 2011-11-24 VITALS — BP 113/69 | HR 93 | Temp 98.6°F | Ht 60.0 in | Wt 155.3 lb

## 2011-11-24 DIAGNOSIS — E119 Type 2 diabetes mellitus without complications: Secondary | ICD-10-CM

## 2011-11-24 DIAGNOSIS — K146 Glossodynia: Secondary | ICD-10-CM | POA: Insufficient documentation

## 2011-11-24 DIAGNOSIS — T783XXA Angioneurotic edema, initial encounter: Secondary | ICD-10-CM

## 2011-11-24 DIAGNOSIS — X58XXXA Exposure to other specified factors, initial encounter: Secondary | ICD-10-CM

## 2011-11-24 DIAGNOSIS — T7840XA Allergy, unspecified, initial encounter: Secondary | ICD-10-CM

## 2011-11-24 DIAGNOSIS — T464X5A Adverse effect of angiotensin-converting-enzyme inhibitors, initial encounter: Secondary | ICD-10-CM

## 2011-11-24 DIAGNOSIS — I10 Essential (primary) hypertension: Secondary | ICD-10-CM

## 2011-11-24 LAB — CBC WITH DIFFERENTIAL/PLATELET
Basophils Relative: 1 % (ref 0–1)
Eosinophils Absolute: 0.5 10*3/uL (ref 0.0–0.7)
Eosinophils Relative: 6 % — ABNORMAL HIGH (ref 0–5)
HCT: 41.3 % (ref 36.0–46.0)
Hemoglobin: 14 g/dL (ref 12.0–15.0)
MCH: 29.9 pg (ref 26.0–34.0)
MCHC: 33.9 g/dL (ref 30.0–36.0)
MCV: 88.1 fL (ref 78.0–100.0)
Monocytes Absolute: 0.7 10*3/uL (ref 0.1–1.0)
Monocytes Relative: 9 % (ref 3–12)
Neutro Abs: 3.1 10*3/uL (ref 1.7–7.7)
RDW: 13.4 % (ref 11.5–15.5)

## 2011-11-24 MED ORDER — LORATADINE 10 MG PO TABS: 10.0000 mg | ORAL_TABLET | Freq: Every day | ORAL | Status: DC

## 2011-11-24 MED ORDER — HYDROCHLOROTHIAZIDE 25 MG PO TABS: 25.0000 mg | ORAL_TABLET | Freq: Every day | ORAL | Status: DC

## 2011-11-24 NOTE — Assessment & Plan Note (Signed)
BP controlled today 113/69 Concerned for ACEI induced angioedema or food allergy to berries Stop Lisinopril combo with HCTZ for BP and continue HCTZ only 25 mg qd
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
## 2011-11-24 NOTE — Assessment & Plan Note (Signed)
Concerned for ACEI induced angioedema or food allergy to berries Stop Lisinopril combo with HCTZ for BP and continue HCTZ only 25 mg qd Try Loratidine 10 mg for possible food allergy; Avoid berries for now Avoid Aspirin until follow up visit in 1 month.   Will check a CBC with diff for eosinophils today.

## 2011-11-24 NOTE — Patient Instructions (Addendum)
Please take Tylenol for pain Avoid berries (raspberries, blueberries, blackberries) for now Start taking Hydrochorothiazide only for blood pressure Please start taking Loratadine for allergies Return for 1 month with Dr. Eben Burow   Food Allergy A food allergy causes your body to have a strange reaction after you eat or drink certain foods or drinks. Allergic reactions can cause puffiness (swelling) and itchy, red rashes and hives. Sometimes you will throw up (vomit) or have watery poop (diarrhea). Severe allergic reactions can be life-threatening. These reactions can make it hard to breathe or swallow. HOME CARE If you do not know what caused your allergic reaction:  Write down the foods and drinks you had before the reaction.  Write down any problems you had.  Stop eating or drinking things that cause you to have a reaction. If you have hives or a rash:  Take medicine as told by your doctor.  Place cold cloths on your skin.  Take baths in cool water.  Do not take hot baths or showers. If you are severely allergic:  Wear a medical bracelet or necklace that lists your allergy.  Carry your allergy kit or medicine shot to treat severe allergic reactions with you. These can save your life.  Carry backup medicine shots. You can have a delayed reaction after the medicine from your first shot wears off. This can be just as serious as the first reaction.  Do not drive until medicine from your shot has worn off, unless your doctor says it is okay. GET HELP RIGHT AWAY IF:   You have trouble breathing or you are wheezing.  You have a tight feeling in your chest or throat.  You have puffiness around your mouth.  You have hives, puffiness, or itching all over your body.  You think you are having an allergic reaction. Problems usually start within 30 minutes after eating a food you are allergic to.  Your problems are not better after 2 days.  You have new problems.  Your problems come  back. MAKE SURE YOU:   Understand these instructions.  Will watch your condition.  Will get help right away if you are not doing well or get worse. Document Released: 06/11/2009 Document Revised: 03/16/2011 Document Reviewed: 06/11/2009 [facility] Patient Information 2013 [place], [place]. Angioedema (Angioedema) Un angioedema (AE) es una hinchazn de aparicin repentina de los prpados, labios, lbulos de las [place], genitales externos, piel y otras partes del cuerpo. Puede aparecer por s solo. Suele comenzar [employer] noche y se descubre al despertarse. Puede aparecer junto con urticaria y Production manager. Los ataques pueden ser leves y molestos o peligrosos para la vida (si hay inflamacin en las vas respiratorias). El angioedema en general ocurre en un corto perodo de tiempo (minutos u horas) y mejora en 24 a 48 horas. Generalmente no causa problemas graves.  Hay 2 tipos diferentes de AE:  Alrgicos.  No alrgicos.  Puede haber una reaccin excesiva o estimulacin directa de las clulas que forman parte del sistema inmunolgico (mastocitos).  Puede haber problemas con la liberacin de las sistancias qumicas que produce el organismo y que producen hinchazn e inflamacin (quininas). El angioedema causado por quininas puede haberse transmitido por los padres (hereditaria), o puede aparecer sin antecedentes (adquirido). El angioedema adquirido puede aparecer antes o junto a ciertas enfermedades, o puede deberse a que el sistema inmunolgico del organismo ataca ciertas clulas del propio cuerpo (trastorno autoimmune). CAUSAS Alergias  El AE debido a Therapist, art se producen por algo que  hace que el cuerpo reaccione (disparador). Los disparadores ms conocidos son:  Alimentos.  Medicamentos.  Ltex.  Contacto directo con ciertas frutas, vegetales o saliva animal.  Aguijones de insectos. No alrgicos  La estimulacin de mastocitos puede estar causada  por:  Medicamentos.  Materiales de contraste que se Ship broker.  El sistema inmune ataca partes de las propias clulas del cuerpo (enfermedades autoinmunes).  Posiblemente alguna infeccin por virus.  El AE debido a problemas con quininas pueden deberse a hereditario o adquirido. Los ataques pueden dispararse por:  Lesiones leves.  Intervenciones o cirugas dentales.  Estrs.  Cambios sbitos de temperatura.  Ejercicios.  Medicamentos.  El AE debido a problemas con quininas tambin pueden deberse a ciertos medicamentos, en especial aquellos que son para Personal assistant presin sangunea, como por ejemplo inhibidores de la enzima convertidota de angiotensiana (IECA). Las Statistician tienen un riesgo hasta cinco veces mayor de Environmental education officer AE por IECA que los caucsicos. SNTOMAS Alrgicos sntomas:  Edema de la piel sin picazn. A menudo la inflamacin puede verse en la cara y labios pero puede hincharse cualquier parte del cuerpo. Algunas veces la inflamacin puede resultar dolorosa. Si hay urticarias, la picazn es intensa.  Problemas respiratorios si se inflaman las vas respiratorias. No alrgicos sntomas:  Si hay rganos internos involucrados, pueden ser:  Nuseas.  Dolor abdominal.  Vmitos.  Dificultad para tragar.  Dificultad al Beatrix Shipper.  Problemas respiratorios si se inflaman las vas respiratorias. Segn el caso de AE, los episodios pueden:  Suceder slo una vez (si los disparadores se evitan o eliminan).  Reaparecer en patrones impredecibles.  Se repite por varios aos y luego desaparece gradualmente. DIAGNSTICO El diagnstico de angioedema se hace en base a:   Formular preguntas para averiguar con qu rapidez comenzaron los sntomas.  Tomar una historia familiar.  Examen fsico .  Pruebas de diagnstico Los anlisis pueden ser:  Pruebas de alergia en la piel para descartar un problema alrgico.  Anlisis de sangre para  diagnosticar algunos tipos de angioedema hereditario y adquirido.  Otras pruebas para ver si hay enfermedades ocultas que puedan causar este problema. TRATAMIENTO El tratamiento depende del tipo y la causa (si la hay) del AE. Alergias  Los tipos de AE por alergia se tratan con:  Eliminacin inmediata del disparador o medicamento (si lo hay).  Inyeccin de epinefrina.  Esteroides.  Antihistamnicos.  Hospitalizacin por ataques graves. No alrgicos  Los tipos de AE por estimulacin de mastocitos se tratan con:  Eliminacin inmediata del disparador o medicamento (si lo hay).  Inyeccin de epinefrina.  Esteroides.  Antihistamnicos.  Hospitalizacin por ataques graves.  El AE hereditario se trata con:  Medicamentos para prevenir y tratar ataques. Hay poca respuesta a los antihistamnicos, epinefrina o esteroides.  Medicamentos preventivos antes de la intervencin o Environmental education officer.  Retire o evite los medicamentos que le desencadenan un ataque.  Hospitalizacin por ataques graves.  El AE adquirido se trata con:  Tratamiento de las causas subyacentes (si las hay).  Medicamentos para prevenir y tratar ataques. INSTRUCCIONES PARA EL CUIDADO DOMICILIARIO  Siempre lleve consigo los medicamentos para un tratamiento de Associate Professor.  Use un brazalete de alerta mdico.  Evite los disparadores conocidos. SOLICITE ANTENCIN MDICA SI:  Tiene ataques repetidos.  Los ataques son ms frecuentes o ms graves a Scientist, water quality de las medidas preventivas.  Si tiene AE hereditario y Adult nurse nios. Es importante debatir los riesgos de Engineering geologist esto a sus hijos. SOLICITE ATENCIN MDICA DE INMEDIATO SI:  Presenta dificultades respiratorias.  Tiene problemas para tragar.  Desmayos. Esta enfermedad debe tratarse inmediatamente. Puede amenazar la vida si implica la garganta y le causa hinchazn. Document Released: 10/01/2004 Document Revised: 03/16/2011 [facility] Patient  Information 2013 [place], [place].

## 2011-12-16 ENCOUNTER — Other Ambulatory Visit: Payer: Self-pay | Admitting: *Deleted

## 2011-12-16 MED ORDER — INSULIN GLARGINE 100 UNIT/ML ~~LOC~~ SOLN: 38.0000 [IU] | Freq: Every day | SUBCUTANEOUS | Status: DC

## 2011-12-16 NOTE — Telephone Encounter (Signed)
Rx called in to pharmacy. 

## 2011-12-21 ENCOUNTER — Encounter: Payer: Self-pay | Admitting: Internal Medicine

## 2011-12-24 ENCOUNTER — Other Ambulatory Visit (HOSPITAL_COMMUNITY)
Admission: RE | Admit: 2011-12-24 | Discharge: 2011-12-24 | Disposition: A | Discharge status: Home / Self Care | Payer: No Typology Code available for payment source | Source: Ambulatory Visit | Attending: Internal Medicine | Admitting: Internal Medicine

## 2011-12-24 ENCOUNTER — Encounter: Payer: Self-pay | Admitting: Internal Medicine

## 2011-12-24 ENCOUNTER — Ambulatory Visit (INDEPENDENT_AMBULATORY_CARE_PROVIDER_SITE_OTHER): Payer: No Typology Code available for payment source | Admitting: Internal Medicine

## 2011-12-24 VITALS — BP 144/93 | HR 85 | Temp 97.1°F | Wt 154.3 lb

## 2011-12-24 DIAGNOSIS — K146 Glossodynia: Secondary | ICD-10-CM

## 2011-12-24 DIAGNOSIS — R21 Rash and other nonspecific skin eruption: Secondary | ICD-10-CM

## 2011-12-24 DIAGNOSIS — E119 Type 2 diabetes mellitus without complications: Secondary | ICD-10-CM

## 2011-12-24 DIAGNOSIS — Z79899 Other long term (current) drug therapy: Secondary | ICD-10-CM

## 2011-12-24 DIAGNOSIS — L851 Acquired keratosis [keratoderma] palmaris et plantaris: Secondary | ICD-10-CM

## 2011-12-24 LAB — GLUCOSE, CAPILLARY: Glucose-Capillary: 238 mg/dL — ABNORMAL HIGH (ref 70–99)

## 2011-12-24 MED ORDER — AMLODIPINE BESYLATE 10 MG PO TABS: 10.0000 mg | ORAL_TABLET | Freq: Every day | ORAL | Status: DC

## 2011-12-24 MED ORDER — INSULIN GLARGINE 100 UNIT/ML ~~LOC~~ SOLN: 42.0000 [IU] | Freq: Every day | SUBCUTANEOUS | Status: DC

## 2011-12-24 MED ORDER — METFORMIN HCL 1000 MG PO TABS: 1000.0000 mg | ORAL_TABLET | Freq: Two times a day (BID) | ORAL | Status: DC

## 2011-12-24 MED ORDER — INSULIN GLARGINE 100 UNIT/ML ~~LOC~~ SOLN: 38.0000 [IU] | Freq: Every day | SUBCUTANEOUS | Status: DC

## 2011-12-24 NOTE — Patient Instructions (Addendum)
Avoid wearing the undergarment that you have been using and get something with made up from pure cotton without any metal inserts. I have stopped your HCTZ today. I will call you with biopsy results.

## 2011-12-24 NOTE — Assessment & Plan Note (Signed)
After reviewing up to date, drug eruption or contact dermatitis seems to be th top cause. Other less likely differentials include HSP but she does not meet all the criteria.  I will stop HCTZ today as it may be the offending drug. Asked her to change her undergarments. Skin biopsy done today for pathological diagnosis. Follow up with dermatology as soon as we get an appointment. No topical therapy at this time as she has had this for 8 months and they are not expanding at this time.

## 2011-12-24 NOTE — Assessment & Plan Note (Signed)
Better diabetes control. 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 Cant see a definite lesion to do viral culture.

## 2011-12-24 NOTE — Assessment & Plan Note (Signed)
Increase lantus by 4 points today. Follow up in 1 month or as needed.

## 2011-12-24 NOTE — Progress Notes (Signed)
  Subjective:    Patient ID: Robin Klein, female    DOB: Jan 04, 1971, 41 y.o.   MRN: [id number]  HPI  Patient is here today for BP check up which is still elevated above 140/90.  She still complains of a feeling of swelled tongue and a feeling that there are hairs in her mouth everytime she eats. She also tells me that she has noticed rashes in her upper abdomen/breast reagion bilaterally for last 8 months but did not tell anyone as she was scared.The rash started as itchy spots but everytime she would scratch, they will change into a permanent brown spot. She now has multiple spots all over her breasts and upper abdomen. She denies any new medications or new undergarments.    Review of Systems  All other systems reviewed and are negative.       Objective:   Physical Exam  Constitutional: She is oriented to person, place, and time. She appears well-developed and well-nourished.  HENT:  Head: Normocephalic and atraumatic.       Tongue has multiple small lesions on the posterior half. It seems to be swollen but not obstructing at this time as I was able to visualize posterior past of the throat.  Eyes: Conjunctivae normal and EOM are normal. Pupils are equal, round, and reactive to light. No scleral icterus.  Neck: Normal range of motion. Neck supple. No JVD present. No thyromegaly present.  Cardiovascular: Normal rate, regular rhythm, normal heart sounds and intact distal pulses.  Exam reveals no gallop and no friction rub.   No murmur heard. Pulmonary/Chest: Effort normal and breath sounds normal. No respiratory distress. She has no wheezes. She has no rales.  Abdominal: Soft. Bowel sounds are normal. She exhibits no distension and no mass. There is no tenderness. There is no rebound and no guarding.       Multiple rashes all over the lower half of her breasts and upper abdomen. Non palpable, maculopapular rashes, brown in color, non blanchable. Size varies from peanut to the  size of a quarter.  Musculoskeletal: Normal range of motion. She exhibits no edema and no tenderness.  Lymphadenopathy:    She has no cervical adenopathy.  Neurological: She is alert and oriented to person, place, and time.  Psychiatric: She has a normal mood and affect. Her behavior is normal.          Assessment & Plan:

## 2012-01-04 NOTE — Progress Notes (Signed)
Sent a flag to front desk pool for appt per Dr Eben Burow.

## 2012-01-19 ENCOUNTER — Encounter: Payer: Self-pay | Admitting: Internal Medicine

## 2012-01-19 ENCOUNTER — Ambulatory Visit: Payer: No Typology Code available for payment source | Admitting: Internal Medicine

## 2012-01-19 ENCOUNTER — Ambulatory Visit (INDEPENDENT_AMBULATORY_CARE_PROVIDER_SITE_OTHER): Payer: No Typology Code available for payment source | Admitting: Internal Medicine

## 2012-01-19 VITALS — BP 113/80 | HR 83 | Temp 97.1°F | Ht 60.0 in | Wt 153.9 lb

## 2012-01-19 DIAGNOSIS — L259 Unspecified contact dermatitis, unspecified cause: Secondary | ICD-10-CM

## 2012-01-19 DIAGNOSIS — L280 Lichen simplex chronicus: Secondary | ICD-10-CM

## 2012-01-19 DIAGNOSIS — K146 Glossodynia: Secondary | ICD-10-CM

## 2012-01-19 NOTE — Assessment & Plan Note (Signed)
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 According to up-to-date, this problem can be caused by medication including hydrochlorothiazide. In her previous visit, her hydrochlorothiazide was discontinued by Dr. Eben Burow. Patient reports that her symptoms are improving. Her itchiness are better. Her rashes has not expanded since she stopped taking hydrochlorothiazide.   -Will continue Claritin and followup.

## 2012-01-19 NOTE — Assessment & Plan Note (Signed)
Etiology is not clear. Per Dr. 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 The patient reports that her tonsillitis getting better. On physical examination I don't think patient has any tongue swelling currently. Patient has mild pain in her chin when she chews food. I offered her antiviral therapy, but she said her symptoms are tolerable and would like to be observed from now. Patient is instructed to come back to the hospital if her symptoms get worse.

## 2012-01-19 NOTE — Progress Notes (Signed)
Patient ID: Robin Klein, female   DOB: 1970/10/26, 42 y.o.   MRN: [id number] Subjective:   Patient ID: Robin Klein female   DOB: [birth year] 42 y.o.   MRN: [id number]  CC:  follow up visit for skin rash.     HPI:  [name] is a 42 y.o. laday with past medical history as outlined below, who presents for a followup visit.              who presents for a hospital followup visit today.  Patient was recently seen in clinic on 12/24/11 for skin rashes and swelled tongue.   1). Rashes: she has noticed rashes in her upper abdomen/breast region bilaterally for last 8 months. he rash started as itchy spots but everytime she would scratch, they will change into a permanent brown spot. She now has multiple spots all over her breasts and upper abdomen. 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 Her HCTZ was discontinued in his previous visit. Patient reports that her skin rashes has been slightly better. Her itch improved since stopped HCTZ.  2.) Tongue swelling: in he previous visit, patient also had feeling of swelled tongue and  feeling that there are hairs in her mouth everytime she eats. Etiology was not clear. Per Dr. Sumner Boast note, if symptoms persist, patient may be treated empirically with antiviral HSV medication. Today patient reports that her tongue swelling is getting better. She still has itchiness over her chain. She feels like her lips are little purple in color. She has mild pain on chin when she chews food. The patient reports that all symptoms are improving.  Past Medical History  Diagnosis Date  . Diabetes mellitus   . Hypertension   . Fatty liver disease, nonalcoholic   . Depression   . Sinusitis    Current Outpatient Prescriptions  Medication Sig Dispense Refill  . amLODipine (NORVASC) 10 MG tablet Take 1 tablet (10 mg total) by mouth daily.  30 tablet  11  . aspirin 81 MG tablet Take 81 mg by mouth daily.  Patient agreed to start over the counter 81 mg aspirin daily      . Blood Glucose Monitoring Suppl (WAVESENSE PRESTO PRO METER) DEVI Use to check blood sugar up to 2 times daily       . glucose blood (WAVESENSE PRESTO TEST) test strip Use to test blood sugar up to 2 times daily       . insulin glargine (LANTUS) 100 UNIT/ML injection Inject 42 Units into the skin daily. To control blood sugars  10 mL  11  . Insulin Syringe-Needle U-100 (GNP INSULIN SYRINGE) 31G X 5/16" 0.5 ML MISC Use to inject insulin once daily       . Lancets MISC Use to check blood sugar as directed       . loratadine (CLARITIN) 10 MG tablet Take 1 tablet (10 mg total) by mouth daily.  30 tablet  1  . metFORMIN (GLUCOPHAGE) 1000 MG tablet Take 1 tablet (1,000 mg total) by mouth 2 (two) times daily with a meal.  60 tablet  11  . Prenatal Vit-Fe Fumarate-FA (PRENATAL 1 PLUS 1 PO) Take 1 tablet by mouth.         Family History  Problem Relation Age of Onset  . Diabetes Mother   . Diabetes Father   . Cancer Paternal Grandmother    History   Social History  . Marital Status: Married  Spouse Name: N/A    Number of Children: N/A  . Years of Education: N/A   Social History Main Topics  . Smoking status: Never Smoker   . Smokeless tobacco: Never Used  . Alcohol Use: No  . Drug Use: No  . Sexually Active: Yes    Birth Control/ Protection: None   Other Topics Concern  . None   Social History Narrative   Financial assistance approved for 100% discount at [facility] and has [facility] card per Deborah Hill12/12/2011Recently gave birth to a son 04/2009.    Review of Systems: Review of Systems  All other systems reviewed and are negative.  Objective:  Physical Exam: Filed Vitals:   01/19/12 1014  BP: 113/80  Pulse: 83  Temp: 97.1 F (36.2 C)  TempSrc: Oral  Height: 5' (1.524 m)  Weight: 153 lb 14.4 oz (69.809 kg)  SpO2: 98%   General: resting in bed, not in acute distress HEENT: PERRL, EOMI, no scleral icterus.  There is no tongue swelling.  Lips are mildly purple in color without any lesions. No lymphadenopathy in neck.   Cardiac: S1/S2, RRR, No murmurs, gallops or rubs Pulm: Good air movement bilaterally, Clear to auscultation bilaterally, No rales, wheezing, rhonchi or rubs. Abd: Soft,  nondistended, nontender, no rebound pain, no organomegaly, BS present Ext: No rashes or edema, 2+DP/PT pulse bilaterally Musculoskeletal: No joint deformities, erythema, or stiffness, ROM full and no nontender Skin:  Multiple rashes all over the lower half of her breasts and upper abdomen. Non palpable, maculopapular rashes, brown in color, non blanchable. Size varies from peanut to the size of a quarter. Neuro: alert and oriented X3, cranial nerves II-XII grossly intact, muscle strength 5/5 in all extremeties,  sensation to light touch intact.  Psych.: patient is not psychotic, no suicidal or hemocidal ideation.   Assessment & Plan:

## 2012-01-19 NOTE — Patient Instructions (Signed)
1. Please come back if your symptoms get worse. 2. Please take all medications as prescribed.  3. If you have worsening of your symptoms or new symptoms arise, please call the clinic ([phone]), or go to the ER immediately if symptoms are severe.

## 2012-02-20 ENCOUNTER — Emergency Department (HOSPITAL_COMMUNITY)
Admission: EM | Admit: 2012-02-20 | Discharge: 2012-02-20 | Disposition: A | Discharge status: Home / Self Care | Payer: No Typology Code available for payment source | Attending: Emergency Medicine | Admitting: Emergency Medicine

## 2012-02-20 ENCOUNTER — Encounter (HOSPITAL_COMMUNITY): Payer: Self-pay | Admitting: *Deleted

## 2012-02-20 DIAGNOSIS — K7689 Other specified diseases of liver: Secondary | ICD-10-CM | POA: Insufficient documentation

## 2012-02-20 DIAGNOSIS — I10 Essential (primary) hypertension: Secondary | ICD-10-CM | POA: Insufficient documentation

## 2012-02-20 DIAGNOSIS — Z794 Long term (current) use of insulin: Secondary | ICD-10-CM | POA: Insufficient documentation

## 2012-02-20 DIAGNOSIS — E119 Type 2 diabetes mellitus without complications: Secondary | ICD-10-CM | POA: Insufficient documentation

## 2012-02-20 DIAGNOSIS — Z8659 Personal history of other mental and behavioral disorders: Secondary | ICD-10-CM | POA: Insufficient documentation

## 2012-02-20 DIAGNOSIS — R040 Epistaxis: Secondary | ICD-10-CM | POA: Insufficient documentation

## 2012-02-20 DIAGNOSIS — Z8709 Personal history of other diseases of the respiratory system: Secondary | ICD-10-CM | POA: Insufficient documentation

## 2012-02-20 MED ORDER — OXYMETAZOLINE HCL 0.05 % NA SOLN
1.0000 | Freq: Once | NASAL | Status: AC
  Administered 2012-02-20: 1 via NASAL
  Filled 2012-02-20: qty 15

## 2012-02-20 NOTE — ED Provider Notes (Signed)
Medical screening examination/treatment/procedure(s) were performed by non-physician practitioner and as supervising physician I was immediately available for consultation/collaboration.   Vera Furniss, MD 02/20/12 1551 

## 2012-02-20 NOTE — ED Provider Notes (Signed)
History     CSN: [id number]  Arrival date & time 02/20/12  1026   First MD Initiated Contact with Patient 02/20/12 1103      Chief Complaint  Patient presents with  . Epistaxis    (Consider location/radiation/quality/duration/timing/severity/associated sxs/prior treatment) HPI Comments: Robin Klein is a 42 y.o. Female preseting with left nasal bleeding which started last night around 7 pm and resolved after holding pressure.  After waking this am, the bleeding resumed around 9 am and has continued despite holding pressure.  She denies injury to the nose and also denies weakness, dizziness or headaches. She does not have a history of htn or bleeding disorder.     The history is provided by the patient and the spouse.    Past Medical History  Diagnosis Date  . Diabetes mellitus   . Hypertension   . Fatty liver disease, nonalcoholic   . Depression   . Sinusitis     Past Surgical History  Procedure Laterality Date  . Cesarean section      Family History  Problem Relation Age of Onset  . Diabetes Mother   . Diabetes Father   . Cancer Paternal Grandmother     History  Substance Use Topics  . Smoking status: Never Smoker   . Smokeless tobacco: Never Used  . Alcohol Use: No    OB History   Grav Para Term Preterm Abortions TAB SAB Ect Mult Living   4 3 3  0 1  1 0 0 3      Review of Systems  Constitutional: Negative for fever, chills and fatigue.  HENT: Positive for nosebleeds. Negative for congestion, sore throat, neck pain and postnasal drip.   Eyes: Negative.   Respiratory: Negative for chest tightness and shortness of breath.   Cardiovascular: Negative for chest pain.  Gastrointestinal: Negative for nausea and abdominal pain.  Genitourinary: Negative.   Musculoskeletal: Negative for joint swelling and arthralgias.  Skin: Negative.  Negative for rash and wound.  Neurological: Negative for dizziness, weakness, light-headedness, numbness and  headaches.  Psychiatric/Behavioral: Negative.     Allergies  Review of patient's allergies indicates no known allergies.  Home Medications   Current Outpatient Rx  Name  Route  Sig  Dispense  Refill  . amLODipine (NORVASC) 10 MG tablet   Oral   Take 1 tablet (10 mg total) by mouth daily.   30 tablet   11   . insulin glargine (LANTUS) 100 UNIT/ML injection   Subcutaneous   Inject 42 Units into the skin daily. To control blood sugars   10 mL   11   . metFORMIN (GLUCOPHAGE) 1000 MG tablet   Oral   Take 1,000 mg by mouth 2 (two) times daily with a meal.         . Prenatal Vit-Fe Fumarate-FA (PRENATAL 1 PLUS 1 PO)   Oral   Take 1 tablet by mouth.           . Blood Glucose Monitoring Suppl (WAVESENSE PRESTO PRO METER) DEVI      Use to check blood sugar up to 2 times daily          . glucose blood (WAVESENSE PRESTO TEST) test strip      Use to test blood sugar up to 2 times daily          . Insulin Syringe-Needle U-100 (GNP INSULIN SYRINGE) 31G X 5/16" 0.5 ML MISC      Use to inject insulin once  daily          . Lancets MISC      Use to check blood sugar as directed            BP 140/86  Pulse 85  Temp(Src) 98.7 F (37.1 C) (Oral)  Resp 18  SpO2 100%  LMP 12/30/2011  Physical Exam  Nursing note and vitals reviewed. Constitutional: She appears well-developed and well-nourished.  HENT:  Head: Normocephalic and atraumatic.  Nose: Epistaxis is observed.  Small abrasion noted left medial anterior septum with trace of fresh blood oozing from site.  Eyes: Conjunctivae are normal.  Neck: Normal range of motion.  Cardiovascular: Normal rate and regular rhythm.   Pulmonary/Chest: Effort normal and breath sounds normal.  Abdominal: Soft. She exhibits no distension.  Musculoskeletal: Normal range of motion.  Neurological: She is alert.  Skin: Skin is warm and dry.  Psychiatric: She has a normal mood and affect.    ED Course  EPISTAXIS  MANAGEMENT Date/Time: 02/20/2012 11:00 AM Performed by: Burgess Amor Authorized by: Burgess Amor Consent: Verbal consent obtained. Risks and benefits: risks, benefits and alternatives were discussed Consent given by: patient Patient identity confirmed: verbally with patient Time out: Immediately prior to procedure a "time out" was called to verify the correct patient, procedure, equipment, support staff and site/side marked as required. Treatment site: left anterior Repair method: silver nitrate Post-procedure assessment: bleeding stopped Treatment complexity: simple Patient tolerance: Patient tolerated the procedure well with no immediate complications. Comments: No recurrence of bleeding prior to dc home.   (including critical care time)  Labs Reviewed - No data to display No results found.   1. Acute anterior epistaxis       MDM  Acute epistaxis with complete resolution of bleeding after cautery. Pt advised to not blow or manipulate nose for the next several days. Return for any rebleeding.            Burgess Amor, [place] 02/20/12 [phone]

## 2012-02-20 NOTE — ED Notes (Signed)
Reports left nare nosebleed that started  Yesterday but more severe today, bleeding moderate amount at triage. Airway intact. Also reports having n/v/d yesterday. Has htn, but bo 140/86 at triage, denies any blood thinners.

## 2012-04-25 ENCOUNTER — Encounter: Payer: Self-pay | Admitting: Internal Medicine

## 2012-04-25 ENCOUNTER — Ambulatory Visit (INDEPENDENT_AMBULATORY_CARE_PROVIDER_SITE_OTHER): Payer: No Typology Code available for payment source | Admitting: Internal Medicine

## 2012-04-25 VITALS — BP 123/82 | HR 101 | Temp 97.7°F | Wt 155.5 lb

## 2012-04-25 DIAGNOSIS — J36 Peritonsillar abscess: Secondary | ICD-10-CM

## 2012-04-25 DIAGNOSIS — E119 Type 2 diabetes mellitus without complications: Secondary | ICD-10-CM

## 2012-04-25 MED ORDER — INSULIN GLARGINE 100 UNIT/ML ~~LOC~~ SOLN: 45.0000 [IU] | Freq: Every day | SUBCUTANEOUS | Status: DC

## 2012-04-25 MED ORDER — AMOXICILLIN-POT CLAVULANATE 875-125 MG PO TABS: 1.0000 | ORAL_TABLET | Freq: Two times a day (BID) | ORAL | Status: AC

## 2012-04-25 NOTE — Patient Instructions (Addendum)
Take antibiotics as prescribed.Follow up in 2-3 days(Patient is more comfortable with Thursday as her husband is off)    Absceso periamigdalino (Peritonsillar Abscess) El absceso periamigdalino es la acumulacin de pus en la parte posterior de la garganta, detrs de las [place]. Generalmente se produce cuando una infeccin por estreptococo en la garganta o en las amgdalas se disemina en el espacio que rodea a las amgdalas. Casi siempre la causa es la bacteria estreptococo (germen). El tratamiento ms frecuente para esta enfermedad es el drenaje; para ello se coloca una aguja en el absceso o se le hace una incisin (corte) y luego se lo drena. Por lo general, despus se indica un tratamiento con antibiticos. INSTRUCCIONES PARA EL CUIDADO DOMICILIARIO  Si el profesional le ha drenado el absceso en el da de hoy, enjuague la garganta (grgaras) con una solucin de agua tibia con sal, cuatro veces por da, o segn lo necesario hasta obtener alivio. No trague esta solucin. Mezcle 1 cucharadita de sal en 220 cc (8 onzas) de agua tibia y haga grgaras.  Haga reposo en cama todo lo que pueda. Reanude las actividades cuando se sienta en condiciones.  Aplique fro en el cuello para Engineer, materials. Llene una bolsa plstica con hielo y envulvala en una toalla. Aplique el hielo sobre el cuello durante 20 minutos, 4 veces por da.  Consuma una dieta blanda o lquida segn la tolerancia, mientras sienta dolor en la garganta. Una buena eleccin pueden ser los Popsicles y los helados. Beber gran cantidad de lquidos fros probablemente lo aliviar y lo ayudar a disminuir la hinchazn si lo hace entre las Designer, jewellery.  Utilice los medicamentos de venta libre o de prescripcin para Chief Technology Officer, Environmental health practitioner o la [place], segn se lo indique el profesional que lo asiste. No tome aspirina a menos que se lo haya indicado el mdico. La aspirina hace ms lento el proceso de coagulacin y Scientist, clinical (histocompatibility and immunogenetics)  hemorragias en la zona del drenaje, si la incisin fue practicada en el da de hoy.  Si le prescribieron antibiticos, tmelos segn le hayan indicado hasta completar el tratamiento, aunque se sienta mejor. SOLICITE ATENCIN MDICA SI:  El dolor, la hinchazn, la inflamacin o el drenaje [place].  Presenta signos de infeccin, como mareos, dolor de [place], Radiographer, therapeutic o sensacin de Engineer, maintenance (IT).  Presenta dificultades para tragar o comer.  Muestra sntomas de deshidratacin (mareos al pararse, menor cantidad de [place], ritmo cardaco elevado, o sequedad en la boca y las membranas mucosas). SOLICITE ATENCIN MDICA DE INMEDIATO SI:  Tiene fiebre.  Expectora o vomita sangre.  El dolor de [place] y no puede controlarlo con los [place], o comienza a Architect.  Presenta dificultad para respirar o para hablar, o le resulta ms fcil respirar si se inclina hacia delante. Document Released: 10/01/2004 Document Revised: 03/16/2011 [facility] Patient Information 2013 [place], [place].

## 2012-04-28 ENCOUNTER — Encounter: Payer: Self-pay | Admitting: Radiation Oncology

## 2012-04-28 ENCOUNTER — Ambulatory Visit (INDEPENDENT_AMBULATORY_CARE_PROVIDER_SITE_OTHER): Payer: No Typology Code available for payment source | Admitting: Radiation Oncology

## 2012-04-28 VITALS — BP 118/83 | HR 86 | Temp 96.8°F | Ht 60.0 in | Wt 154.4 lb

## 2012-04-28 DIAGNOSIS — J36 Peritonsillar abscess: Secondary | ICD-10-CM

## 2012-04-28 DIAGNOSIS — E119 Type 2 diabetes mellitus without complications: Secondary | ICD-10-CM

## 2012-04-28 NOTE — Progress Notes (Signed)
Case discussed with Dr. McTyre at the time of the visit. We reviewed the resident's history and exam and pertinent patient test results. I agree with the assessment, diagnosis and plan of care documented in the resident's note. 

## 2012-04-28 NOTE — Assessment & Plan Note (Addendum)
Pt diagnosed with peritonsillar abscess by Dr. Eben Burow on 4/21. Pt reports symptoms are much improved with augmentin. Instructed to contact the clinic or to go to the ED if symptoms worsen however she appears to be responding well to treatment.  - cont augmentin (14d course)

## 2012-04-28 NOTE — Patient Instructions (Addendum)
Amoxicillin; Clavulanic Acid tablets Qu es este medicamento? El compuesto AMOXICILINA; CIDO CLAVULNICO es un antibitico penicilnico. Se utiliza en el tratamiento de ciertos tipos de infecciones bacterianas. No es efectivo para resfros, gripe u otras infecciones de origen viral. Este medicamento puede ser utilizado para otros usos; si tiene alguna pregunta consulte con su proveedor de atencin mdica o con su farmacutico. Qu le debo informar a mi profesional de la salud antes de tomar este medicamento? Necesita saber si usted presenta alguno de los siguientes problemas o situaciones: -enfermedad intestinal, como colitis -enfermedad renal -enfermedad heptica -mononucleosis -una reaccin alrgica o inusual a la amoxicilina, penicilinas, cefalospornicos, a otros antibiticos, al cido clavulnico, a otros medicamentos, alimentos, colorantes o conservantes -si est embarazada o buscando quedar embarazada -si est amamantando a un beb Cmo debo utilizar este medicamento? Tome este medicamento por va oral con un vaso lleno de agua. Siga las instrucciones de la etiqueta del [place]. [employer] medicamento al comienzo de las comidas. No lo triture ni mastique. Si la tableta tiene una ranura, puede partirla por la mitad, por la ranura para tomarla con ms facilidad. Tome sus dosis a intervalos regulares. No tome su medicamento con una frecuencia mayor a la indicada. Termine todas las dosis de su medicamento como se le haya indicado aun si se siente mejor. No omita ninguna dosis o suspenda el uso de su medicamento antes de lo indicado. Hable con su pediatra para informarse acerca del uso de este medicamento en nios. Puede requerir atencin especial. Sobredosis: Pngase en contacto inmediatamente con un centro toxicolgico o una sala de urgencia si usted cree que haya tomado demasiado medicamento. ATENCIN: [employer] es solo para usted. No comparta este medicamento con nadie. Qu  sucede si me olvido de una dosis? Si olvida una dosis, tmela lo antes posible. Si es casi la hora de la prxima dosis, tome slo esa dosis. No tome dosis adicionales o dobles. Qu puede interactuar con este medicamento? -alopurinol -anticoagulantes -pldoras anticonceptivas -metotrexato -probenecid Puede ser que esta lista no menciona todas las posibles interacciones. Informe a su profesional de [employer] de [employer] productos a base de hierbas, medicamentos de [place] o suplementos nutritivos que est tomando. Si usted fuma, consume bebidas alcohlicas o si utiliza drogas ilegales, indqueselo tambin a su profesional de [employer]. Algunas sustancias pueden interactuar con su medicamento. A qu debo estar atento al usar [employer]? Si los sntomas no mejoran, consulte con su mdico o con su profesional de [employer]. No trate la diarrea con productos de [employer]. Comunquese con su mdico si tiene diarrea que dura ms de 2 das o si es severa y [place]. Si es diabtico, podr [place] un resultado positivo falso en los [employer] de determinacin del nivel de International aid/development worker en la orina. Consulte con su mdico o su profesional de [employer]. Las pldoras anticonceptivas pueden no actuar correctamente mientras est utilizando [employer]. Consulte a su mdico acerca de un mtodo anticonceptivo adicional. Qu efectos secundarios puedo tener al [employer] este medicamento? Efectos secundarios que debe informar a su mdico o a Producer, television/film/video de la salud tan pronto como sea posible: -Therapist, art como erupcin cutnea, picazn o urticarias, hinchazn de la cara, labios o lengua -problemas respiratorios -orina de color amarillo oscuro -fiebre o escalofros, dolor de garganta -enrojecimiento, formacin de ampollas, descamacin o distensin de la piel, inclusive dentro de la boca -convulsiones -dificultad para orinar o cambios en el volumen de orina -sangrado o magulladuras  inusuales -cansancio o  debilidad inusual -manchas blancas o llagas dentro de la boca o garganta Efectos secundarios que, por lo general, no requieren atencin mdica (debe informarlos a su mdico o a su profesional de la salud si persisten o si son molestos): -diarrea -mareos -dolor de cabeza -nuseas, vmito -Programme researcher, broadcasting/film/video -irritacin vaginal o anal Puede ser que esta lista no menciona todos los posibles efectos secundarios. Comunquese a su mdico por asesoramiento mdico [employer]. Usted puede informar los efectos secundarios a la FDA por telfono al 1-800-FDA-1088. Dnde debo guardar mi medicina? Mantngala fuera del alcance de los nios. Gurdela a una [employer] de menos de 25 grados C (77 grados F). Mantenga el envase bien cerrado. Deseche todo el medicamento que no haya utilizado, despus de la fecha de vencimiento. ATENCIN: Este folleto es un resumen. Puede ser que no cubra toda la posible informacin. Si usted tiene preguntas acerca de esta medicina, consulte con su mdico, su farmacutico o su profesional de Radiographer, therapeutic.  2013, Elsevier/Gold Standard. (12/22/2006 3:34:00 PM)

## 2012-04-28 NOTE — Progress Notes (Signed)
Subjective:    Patient ID: Robin Klein, female    DOB: 11/20/1970, 42 y.o.   MRN: [id number]  HPI Patient was seen on 04/25/2012 and was diagnosed with a peritonsillar abscess at that time. She was prescribed a 14 day course of Augmentin, and states she has been fully compliant with his medication. She states that since her previous visit her sore throat has improved markedly. She denies any difficulty swallowing. Denies fevers/chills.  She complains of one episode of nausea with Augmentin, however she admits that this was at a time when she did not take this medication with food. She admits to occasional loose stools since starting the antibiotics, but no watery diarrhea.  She has no other complaints today, and states that she feels much better.   Review of Systems  All other systems reviewed and are negative.   Current Outpatient Medications: Current Outpatient Prescriptions  Medication Sig Dispense Refill  . amLODipine (NORVASC) 10 MG tablet Take 1 tablet (10 mg total) by mouth daily.  30 tablet  11  . amoxicillin-clavulanate (AUGMENTIN) 875-125 MG per tablet Take 1 tablet by mouth 2 (two) times daily.  28 tablet  0  . Blood Glucose Monitoring Suppl (WAVESENSE PRESTO PRO METER) DEVI Use to check blood sugar up to 2 times daily       . glucose blood (WAVESENSE PRESTO TEST) test strip Use to test blood sugar up to 2 times daily       . insulin glargine (LANTUS) 100 UNIT/ML injection Inject 0.45 mLs (45 Units total) into the skin daily. To control blood sugars  10 mL  11  . Insulin Syringe-Needle U-100 (GNP INSULIN SYRINGE) 31G X 5/16" 0.5 ML MISC Use to inject insulin once daily       . Lancets MISC Use to check blood sugar as directed       . metFORMIN (GLUCOPHAGE) 1000 MG tablet Take 1,000 mg by mouth 2 (two) times daily with a meal.      . Prenatal Vit-Fe Fumarate-FA (PRENATAL 1 PLUS 1 PO) Take 1 tablet by mouth.         No current facility-administered medications for  this visit.    Allergies: Allergies  Allergen Reactions  . Hctz (Hydrochlorothiazide) Rash     Past Medical History: Past Medical History  Diagnosis Date  . Diabetes mellitus   . Hypertension   . Fatty liver disease, nonalcoholic   . Depression   . Sinusitis     Past Surgical History: Past Surgical History  Procedure Laterality Date  . Cesarean section      Family History: Family History  Problem Relation Age of Onset  . Diabetes Mother   . Diabetes Father   . Cancer Paternal Grandmother     Social History: History   Social History  . Marital Status: Married    Spouse Name: N/A    Number of Children: N/A  . Years of Education: N/A   Occupational History  . Not on file.   Social History Main Topics  . Smoking status: Never Smoker   . Smokeless tobacco: Never Used  . Alcohol Use: No  . Drug Use: No  . Sexually Active: Yes    Birth Control/ Protection: None   Other Topics Concern  . Not on file   Social History Narrative   Financial assistance approved for 100% discount at [facility] and has [facility] card per [name]   12/16/2009   Recently gave birth to a son  04/2009.     Vital Signs: Blood pressure 118/83, pulse 86, temperature 96.8 F (36 C), temperature source Oral, height 5' (1.524 m), weight 154 lb 6.4 oz (70.035 kg), last menstrual period 04/28/2012, SpO2 98.00%.      Objective:   Physical Exam  Constitutional: She is oriented to person, place, and time. She appears well-developed and well-nourished. No distress.  HENT:  Head: Normocephalic and atraumatic.  Mouth/Throat: No oropharyngeal exudate, posterior oropharyngeal edema, posterior oropharyngeal erythema or tonsillar abscesses.  Mild non-obstructive bilateral tonsillar edema, R>L.    Eyes: Conjunctivae are normal. Pupils are equal, round, and reactive to light. No scleral icterus.  Neck: Normal range of motion. Neck supple. No tracheal deviation present.  Prominent cervical lymph nodes  with mild TTP.   Abdominal: Soft. Bowel sounds are normal. She exhibits no distension. There is no tenderness.  Musculoskeletal: Normal range of motion. She exhibits no edema.  Neurological: She is alert and oriented to person, place, and time. No cranial nerve deficit.  Skin: Skin is warm and dry. No erythema.  Psychiatric: She has a normal mood and affect. Her behavior is normal.          Assessment & Plan:

## 2012-04-29 NOTE — Progress Notes (Signed)
  Subjective:    Patient ID: Robin Klein, female    DOB: Apr 19, 1970, 42 y.o.   MRN: [id number]  Diabetes Pertinent negatives for hypoglycemia include no dizziness or headaches. Pertinent negatives for diabetes include no chest pain.  URI  Associated symptoms include congestion, neck pain and a sore throat. Pertinent negatives include no abdominal pain, chest pain, coughing, diarrhea, headaches or nausea.   Patient presented with sore throat, cough, difficulty swallowing and fevers since last 3-4 days. Fevers not documented.  Patient tried over the counter cough syrup and pain medications.  Has been having elevated CBG's. Her HBa1c today is elevated mildly to slightly above   Review of Systems  Constitutional: Positive for fever, chills and appetite change. Negative for activity change.  HENT: Positive for congestion, sore throat, neck pain and voice change.   Respiratory: Negative for cough and shortness of breath.   Cardiovascular: Negative for chest pain and leg swelling.  Gastrointestinal: Negative for nausea, abdominal pain, diarrhea, constipation and abdominal distention.  Genitourinary: Negative for frequency, hematuria and difficulty urinating.  Neurological: Negative for dizziness and headaches.  Hematological: Positive for adenopathy.  Psychiatric/Behavioral: Negative for suicidal ideas and behavioral problems.       Objective:   Physical Exam  Constitutional: She is oriented to person, place, and time. She appears well-developed and well-nourished.  HENT:  Head: Normocephalic and atraumatic.  Mouth/Throat: Edematous present. Oropharyngeal exudate, posterior oropharyngeal erythema and tonsillar abscesses present. No posterior oropharyngeal edema.    Eyes: Conjunctivae and EOM are normal. Pupils are equal, round, and reactive to light. No scleral icterus.  Neck: Normal range of motion. Neck supple. No JVD present. No thyromegaly present.  Cardiovascular:  Normal rate, regular rhythm, normal heart sounds and intact distal pulses.  Exam reveals no gallop and no friction rub.   No murmur heard. Pulmonary/Chest: Effort normal and breath sounds normal. No respiratory distress. She has no wheezes. She has no rales.  Abdominal: Soft. Bowel sounds are normal. She exhibits no distension and no mass. There is no tenderness. There is no rebound and no guarding.  Musculoskeletal: Normal range of motion. She exhibits no edema and no tenderness.  Lymphadenopathy:    She has no cervical adenopathy.  Neurological: She is alert and oriented to person, place, and time.  Psychiatric: She has a normal mood and affect. Her behavior is normal.          Assessment & Plan:

## 2012-04-29 NOTE — Assessment & Plan Note (Signed)
Increase lantus by 3 units to 45 units daily.

## 2012-04-29 NOTE — Assessment & Plan Note (Signed)
Patient currently has swelling in her right tonsil which is consistent with peritonsillar abscess.  Augmentin for 14 days as per uptodate. Imaging study if this does not improve with antibiotics. Follow up in 3 days.

## 2012-05-17 ENCOUNTER — Encounter: Payer: Self-pay | Admitting: Dietician

## 2012-05-27 ENCOUNTER — Encounter: Payer: Self-pay | Admitting: Internal Medicine

## 2012-05-27 ENCOUNTER — Ambulatory Visit (INDEPENDENT_AMBULATORY_CARE_PROVIDER_SITE_OTHER): Payer: No Typology Code available for payment source | Admitting: Internal Medicine

## 2012-05-27 VITALS — BP 107/72 | HR 82 | Temp 97.4°F | Ht 59.0 in | Wt 156.8 lb

## 2012-05-27 DIAGNOSIS — I10 Essential (primary) hypertension: Secondary | ICD-10-CM

## 2012-05-27 DIAGNOSIS — E119 Type 2 diabetes mellitus without complications: Secondary | ICD-10-CM

## 2012-05-27 DIAGNOSIS — J36 Peritonsillar abscess: Secondary | ICD-10-CM

## 2012-05-27 MED ORDER — AMLODIPINE BESYLATE 10 MG PO TABS: 10.0000 mg | ORAL_TABLET | Freq: Every day | ORAL | Status: DC

## 2012-05-27 NOTE — Progress Notes (Signed)
  Subjective:    Patient ID: Robin Klein, female    DOB: 1970-12-14, 42 y.o.   MRN: [id number]  HPI patient is a pleasant 42 year old woman with type 2 diabetes, hypertension, hyperlipidemia and other problems as per problem list who comes to the clinic for followup visit of her diabetes and hypertension.  She was seen in the end of April 2014 and her A1c was 8.9. Her dose of Lantus was increased to 45 units at bedtime. She says she had hypoglycemic symptoms with that dose- felt sweaty, cold and so decrease in dose to 40 units. Her CBG today is 234. She continues taking metformin 1 g twice daily. She has 2 glucose meter at home but none of them works and so she hasn't checked her sugars for last 2 weeks. She has not checked her sugars when she felt that she was hypoglycemic.  She wanted to know her eye exam results which was done during last visit.  She wanted prescription for her blood pressure medication.  She denies any fever, chills, nausea vomiting, headache, palpitations abdominal pain, chest pain, short of breath, diarrhea.  She reports complete resolution of her tonsillar symptoms.  Review of Systems    as per history of present illness. Objective:   Physical Exam  General: NAD HEENT: PERRL, EOMI, no scleral icterus Cardiac: S1, S2, RRR, no rubs, murmurs or gallops Pulm: clear to auscultation bilaterally, moving normal volumes of air Abd: soft, nontender, nondistended, BS present Ext: warm and well perfused, no pedal edema Neuro: alert and oriented X3, cranial nerves II-XII grossly intact       Assessment & Plan:

## 2012-05-27 NOTE — Assessment & Plan Note (Signed)
Last A1c 8.9. Advised her to go back to Lantus 45 units at bedtime. I will send a note to Lup[facility] Leash [facility] to help in getting a new glucose meter. - Gave her the printout of her eye exam results done in our clinic. Which were normal. - Followup in 2-3 months.

## 2012-05-27 NOTE — Assessment & Plan Note (Signed)
BP Readings from Last 3 Encounters:  05/27/12 107/72  04/28/12 118/83  04/25/12 123/82    Lab Results  Component Value Date   NA 138 06/18/2011   K 3.8 06/18/2011   CREATININE 0.65 06/18/2011    Assessment: Blood pressure control:   at goal Progress toward BP goal:    improved Comments:   Plan: Medications:  continue current medications, Continue Norvasc 10. Educational resources provided: Comptroller tools provided:   Other plans: No changes in medications.

## 2012-05-27 NOTE — Patient Instructions (Signed)
Please make a followup appointment in 2-3 months.  Talk with Robin Klein and try to arrange for the glucose meter. Call her on Tuesday. I will send her a note to give you a call also.  Meanwhile continue taking insulin 45 units every night.  Continue metformin 1000 mg twice daily. Your eye exam results were normal. You will need to get it done again in one year.  Call us earlier for any further questions or concerns.

## 2012-05-27 NOTE — Assessment & Plan Note (Signed)
clinical resolution.

## 2012-05-31 NOTE — Progress Notes (Signed)
Case discussed with Dr. Ravi Patel  at the time of the visit, immediately after the resident saw the patient.  I reviewed the resident's history and exam and pertinent patient test results.  I agree with the assessment, diagnosis and plan of care documented in the resident's note.  

## 2012-06-02 NOTE — Addendum Note (Signed)
Addended by: Remus Blake on: 06/02/2012 03:47 PM   Modules accepted: Orders

## 2012-06-02 NOTE — Addendum Note (Signed)
Addended by: Remus Blake on: 06/02/2012 03:46 PM   Modules accepted: Orders

## 2012-07-01 ENCOUNTER — Telehealth: Payer: Self-pay | Admitting: Dietician

## 2012-07-01 NOTE — Telephone Encounter (Signed)
Left message using [employer] interpre[employer] line for patient to call us concerning obtaining a blood glucose meter.

## 2012-09-27 ENCOUNTER — Other Ambulatory Visit (HOSPITAL_COMMUNITY)
Admission: RE | Admit: 2012-09-27 | Discharge: 2012-09-27 | Disposition: A | Discharge status: Home / Self Care | Payer: No Typology Code available for payment source | Source: Ambulatory Visit | Attending: Family Medicine | Admitting: Family Medicine

## 2012-09-27 ENCOUNTER — Emergency Department (INDEPENDENT_AMBULATORY_CARE_PROVIDER_SITE_OTHER)
Admission: EM | Admit: 2012-09-27 | Discharge: 2012-09-27 | Disposition: A | Discharge status: Home / Self Care | Payer: No Typology Code available for payment source | Source: Home / Self Care | Attending: Family Medicine | Admitting: Family Medicine

## 2012-09-27 ENCOUNTER — Encounter (HOSPITAL_COMMUNITY): Payer: Self-pay | Admitting: Emergency Medicine

## 2012-09-27 ENCOUNTER — Other Ambulatory Visit: Payer: Self-pay | Admitting: *Deleted

## 2012-09-27 DIAGNOSIS — N760 Acute vaginitis: Secondary | ICD-10-CM | POA: Insufficient documentation

## 2012-09-27 DIAGNOSIS — Z113 Encounter for screening for infections with a predominantly sexual mode of transmission: Secondary | ICD-10-CM | POA: Insufficient documentation

## 2012-09-27 MED ORDER — FLUCONAZOLE 150 MG PO TABS: 150.0000 mg | ORAL_TABLET | Freq: Once | ORAL | Status: DC

## 2012-09-27 MED ORDER — METFORMIN HCL 1000 MG PO TABS: 1000.0000 mg | ORAL_TABLET | Freq: Two times a day (BID) | ORAL | Status: DC

## 2012-09-27 MED ORDER — METRONIDAZOLE 500 MG PO TABS: 500.0000 mg | ORAL_TABLET | Freq: Two times a day (BID) | ORAL | Status: DC

## 2012-09-27 NOTE — ED Provider Notes (Signed)
Robin Klein is a 42 y.o. female who presents to Urgent Care today for vaginal irritation for the past 3 months. Patient notes burning pain on the outside part of her vagina and vulva. She denies any burning when she pees or urinary frequency urgency. She denies significant vaginal discharge. She denies any fevers chills nausea vomiting or diarrhea. She feels well otherwise. She has not tried any medications yet.    Past Medical History  Diagnosis Date  . Diabetes mellitus   . Hypertension   . Fatty liver disease, nonalcoholic   . Depression   . Sinusitis    History  Substance Use Topics  . Smoking status: Never Smoker   . Smokeless tobacco: Never Used  . Alcohol Use: No   ROS as above Medications reviewed. No current facility-administered medications for this encounter.   Current Outpatient Prescriptions  Medication Sig Dispense Refill  . amLODipine (NORVASC) 10 MG tablet Take 1 tablet (10 mg total) by mouth daily.  30 tablet  11  . Blood Glucose Monitoring Suppl (WAVESENSE PRESTO PRO METER) DEVI Use to check blood sugar up to 2 times daily       . glucose blood (WAVESENSE PRESTO TEST) test strip Use to test blood sugar up to 2 times daily       . Insulin Syringe-Needle U-100 (GNP INSULIN SYRINGE) 31G X 5/16" 0.5 ML MISC Use to inject insulin once daily       . Lancets MISC Use to check blood sugar as directed       . Prenatal Vit-Fe Fumarate-FA (PRENATAL 1 PLUS 1 PO) Take 1 tablet by mouth.        . fluconazole (DIFLUCAN) 150 MG tablet Take 1 tablet (150 mg total) by mouth once. spanish  1 tablet  1  . insulin glargine (LANTUS) 100 UNIT/ML injection Inject 0.45 mLs (45 Units total) into the skin daily. To control blood sugars  10 mL  11  . metFORMIN (GLUCOPHAGE) 1000 MG tablet Take 1 tablet (1,000 mg total) by mouth 2 (two) times daily with a meal.  180 tablet  0  . metroNIDAZOLE (FLAGYL) 500 MG tablet Take 1 tablet (500 mg total) by mouth 2 (two) times daily. spanish   14 tablet  0    Exam:  BP 145/97  Pulse 78  Temp(Src) 98.1 F (36.7 C) (Oral)  Resp 18  SpO2 99% Gen: Well NAD HEENT: EOMI,  MMM Lungs: CTABL Nl WOB Heart: RRR no MRG Abd: NABS, NT, ND Exts: Non edematous BL  LE, warm and well perfused.  GYN: Erythematous external genitalia Vaginal canal with white discharge Cervix is normal appearing  No results found for this or any previous visit (from the past 24 hour(s)). No results found.  Assessment and Plan: 42 y.o. female with vulvovaginitis likely due to yeast.  Plan to treat with Diflucan and metronidazole.  Cytology for gonorrhea Chlamydia Trichomonas yeast and BV is pending Followup as needed Discussed warning signs or symptoms. Please see discharge instructions. Patient expresses understanding.      Rodolph Bong, MD 09/27/12 1130

## 2012-09-27 NOTE — ED Notes (Signed)
Pt c/o vaginal irritations onset 3 months Reports she was using her husbands razor blades to shave  Denies: dysuria, vag d/c, back pain She is alert w/no signs of acute distress.

## 2012-10-03 NOTE — ED Notes (Signed)
Lab reports review, positive for yeast only; treatment adequate w Rx prescribed, no further action required        `

## 2012-11-03 ENCOUNTER — Ambulatory Visit (INDEPENDENT_AMBULATORY_CARE_PROVIDER_SITE_OTHER): Payer: No Typology Code available for payment source | Admitting: Internal Medicine

## 2012-11-03 VITALS — BP 128/90 | HR 83 | Temp 97.7°F | Ht 59.0 in | Wt 154.4 lb

## 2012-11-03 DIAGNOSIS — Z299 Encounter for prophylactic measures, unspecified: Secondary | ICD-10-CM

## 2012-11-03 DIAGNOSIS — E785 Hyperlipidemia, unspecified: Secondary | ICD-10-CM

## 2012-11-03 DIAGNOSIS — R21 Rash and other nonspecific skin eruption: Secondary | ICD-10-CM

## 2012-11-03 DIAGNOSIS — E119 Type 2 diabetes mellitus without complications: Secondary | ICD-10-CM

## 2012-11-03 DIAGNOSIS — Z3041 Encounter for surveillance of contraceptive pills: Secondary | ICD-10-CM

## 2012-11-03 DIAGNOSIS — I10 Essential (primary) hypertension: Secondary | ICD-10-CM

## 2012-11-03 DIAGNOSIS — K146 Glossodynia: Secondary | ICD-10-CM

## 2012-11-03 DIAGNOSIS — F329 Major depressive disorder, single episode, unspecified: Secondary | ICD-10-CM

## 2012-11-03 DIAGNOSIS — Z23 Encounter for immunization: Secondary | ICD-10-CM

## 2012-11-03 DIAGNOSIS — Z0000 Encounter for general adult medical examination without abnormal findings: Secondary | ICD-10-CM

## 2012-11-03 LAB — GLUCOSE, CAPILLARY: Glucose-Capillary: 366 mg/dL — ABNORMAL HIGH (ref 70–99)

## 2012-11-03 LAB — LIPID PANEL
Cholesterol: 173 mg/dL (ref 0–200)
Total CHOL/HDL Ratio: 4.3 Ratio
Triglycerides: 205 mg/dL — ABNORMAL HIGH (ref <150)
VLDL: 41 mg/dL — ABNORMAL HIGH (ref 0–40)

## 2012-11-03 LAB — BASIC METABOLIC PANEL WITH GFR
CO2: 26 mEq/L (ref 19–32)
Calcium: 9.5 mg/dL (ref 8.4–10.5)
Chloride: 101 mEq/L (ref 96–112)
Glucose, Bld: 328 mg/dL — ABNORMAL HIGH (ref 70–99)
Sodium: 136 mEq/L (ref 135–145)

## 2012-11-03 NOTE — Patient Instructions (Signed)
Please follow-up with [facility] as soon as possible and Dr. Aundria Rud in 3 months.   1. Restart insulin 45 units every night and restart metformin.   2. Continue amlodipine for blood pressure control.  3. Buy chloraseptic spray to use for relief of tongue.  We will check back on it when I see you again.   4. Buy cortisone cream for your skin irritation on your upper abdomen.   Your left ear should feel better soon.   We are checking your cholesterol today.  I will let you know if anything is abnormal.   We will checking a urine pregnancy test tomorrow morning.  If it is negative, I will prescribe birth control pills that you can pick up at [facility].   You got your flu shot today!  Gua de planeamiento de la alimentacin para diabticos (Diabetes Meal Planning Guide) La gua de planeamiento de alimentacin para diabticos es una herramienta para ayudarlo a planear sus comidas y colaciones. Es importante para las personas con diabetes controlar sus niveles de International aid/development worker. Elegir los [employer] correctos y las cantidades adecuadas durante el da le ayudar a Technical brewer. Comer bien puede incluso ayudarlo a mejorar la presin sangunea y Barista o Pharmacologist un peso saludable. CUENTE LOS HIDRATOS DE CARBONO CON FACILIDAD Cuando consume hidratos de carbono, stos se transforman en azcar (glucosa). Esto a su vez Counsellor de Production assistant, radio. El conteo de carbohidratos puede ayudarlo a Chief Operating Officer este nivel para que se sienta mejor. Al planear sus alimentos con el conteo de carbohidratos, podr tener ms flexibilidad en lo que come y Physiological scientist con el consumo de alimentos. El conteo de carbohidratos significa simplemente sumar la cantidad total de gramos de carbohidratos a sus comidas o colaciones. Trate de consumir la misma cantidad en cada comida. A continuacin encontrar una lista de 1 porcin o 15 gr. de carbohidratos. A continuacin se enumeran. Pregunte al mdico  cuntos gramos de carbohidratos necesita comer en cada comida o colacin. Almidones y granos  1 [place] de pan.   bollo ingls o bollo para hamburguesa o hotdog.   taza de cereal fro (sin azcar).   taza de pasta o arroz cocido.   taza de vegetales que contengan almidn (maz, papas, arvejas, porotos, calabaza).  1 omelette (6 pulgadas).   bollo.  1 waffle o panqueque (del tamao de un CD).   taza de cereales cocidos.  4 a 6 galletas saldas pequeas. *Se recomienda el consumo de granos enteros. Frutas  1 taza de frutos rojos, meln, papaya o anan sin azcar.  1 fruta fresca pequea.   banana o mango.   taza de jugo de frutas (4 onzas sin endulzar).   taza de fruta envasada en jugo natural o agua.  2 cucharadas de frutas secas.  12-15 uvas o cerezas. Leche y yogurt  1 taza de [employer] o al 1%.  1 taza de leche de soja.  6 onzas de yogurt descremado con edulcorante sin azcar.  6 onzas de yogur descremado de soja.  6 onzas de yogur natural. Vegetales  1 taza de vegetales crudos o  de vegetales cocidos se considera cero carbohidratos o una comida "libre".  Si come 3 o ms porciones en una comida, cuntelas como 1 porcin de carbohidratos. Otros carbohidratos   onzas de chips o pretzels.   taza de helado de crema o yogur helado.   taza de helado de agua.  5 cm de torta no congelada.  1 cucharada de miel, azcar, mermelada, jalea o almbar.  2 galletitas dulces pequeas.  3 cuadrados de crackers de graham.  3 tazas de palomitas de maz.  6 crackers.  1 taza de caldo.  Cuente 1 taza de guisado u otra mezcla de alimentos como 2 porciones de carbohidratos.  Los alimentos con menos de 20 caloras por porcin deben contarse como cero carbohidratos o alimento "libre". Si lo desea compre un libro o software de computacin que enumere la cantidad de gramos de carbohidratos de los diferentes alimentos. Adems, el panel  nutricional en las etiquetas de los productos que consume es una buena fuente de informacin. Le indicar el tamao de la porcin y la cantidad total de carbohidratos que consumir por cada una. Divida este nmero por 15 para obtener el nmero de conteo de carbohidratos por porcin. Recuerde: cada porcin son 55 gramos de carbohidratos. PORCIONES La medicin de los alimentos y el tamao de las porciones lo ayudarn a Scientist, physiological cantidad exacta de comida que debe ingerir. La lista que sigue le mostrar el tamao de algunas porciones comunes.   1 onza.................4 dados apilados.  3 onzas..............Marland KitchenUn mazo de cartas.  1 cucharadita...Marland KitchenMarland KitchenLa punta de un dedo pequeo.  1 cucharada.......Marland KitchenUn dedo.  2 cucharadas....Marland KitchenMarland KitchenUna pelota de golf.   taza..............Marland KitchenLa mitad de un puo.  1 taza...............Marland KitchenUn puo. EJEMPLO DE PLAN DE ALIMENTACIN PARA DIABTICOS: A continuacin se muestra un ejemplo de plan de alimentacin que incluye comidas de los grupos de granos y Beggs, Sports administrator, frutas y carnes. Un nutricionista podr confeccionarle un plan individualizado para cubrir sus necesidades calricas y decirle el nmero de porciones que necesita de [place]. Sin embargo, podra [employer] alimentos que contengan carbohidratos (lcteos, cereales y frutas). Controlar la cantidad total de carbohidratos en los alimentos o colaciones es ms importante que asegurarse de incluir todos los grupos alimenticios cada vez que come.  El siguiente plan de alimentacin es un ejemplo de una dieta de 2000 caloras mediante el conteo de carbohidratos. Este plan contiene 17 porciones de carbohidratos. Desayuno  1 taza de avena (2 porciones de carbohidratos).   taza de yogur light(1 porcin de carbohidratos).  1 taza de arndanos (1 porcin de carbohidratos).   taza de almendras. Colacin  1 manzana grande (2 porciones de carbohidratos).  1 palito de queso bajo [employer]. Almuerzo  Ensalada  de pechuga de pollo.  1 taza de espinacas.   taza de tomates cortados.  2 oz (60 gr) de pechuga de pollo en rebanadas.  2 cucharadas de aderezo italiano bajo en [employer].  12 galletas integrales (2 porciones de carbohidratos).  12 a 15 uvas (1 porcin de carbohidratos).  1 taza de [employer] (1porcin de carbohidratos). Colacin  1 taza de zanahorias.   taza de pur de garbanzos (1 porcin de carbohidratos). Cena  3 oz (80 gr) de salmn a la parrilla.  1 taza de arroz integral (3 porciones de carbohidratos). Colacin  1  taza de brcoli al vapor (1 porcin de carbohidrato) con una cucharadita de aceite de oliva y jugo de limn.  1 taza de budn light (2 porciones de carbohidratos). HOJA DE PLANEAMIENTO DE LA ALIMENTACIN: El dietista podr utilizar esta hoja para ayudarlo a decidir cuntas porciones y qu tipos de alimentos son los adecuados para usted.  DESAYUNO Grupo de alimentos y porciones / Alimento elegido Granos/Fculas_________________________________________________ Lcteos________________________________________________________ Rufina Falco ______________________________________________________ Lou Miner __________________________________________________________ Charlesetta Ivory _________________________________________________________ Rosalin Hawking _________________________________________________________ Lorin Mercy de alimentos y porciones / Alimento elegido Granos/Fculas___________________________________________________ Lcteos_________________________________________________________ Lou Miner ___________________________________________________________ Carnes __________________________________________________________  Grasas __________________________________________________________ Danford Bad de alimentos y porciones / Alimento  elegido Granos/Fculas___________________________________________________ Lcteos_________________________________________________________ Lou Miner ___________________________________________________________ Charlesetta Ivory __________________________________________________________ Rosalin Hawking __________________________________________________________ Jettie Pagan de alimentos y porciones / Alimento elegido Granos/Fculas_________________________________________________ Lcteos________________________________________________________ Rufina Falco ______________________________________________________ Lou Miner _________________________________________________________ Charlesetta Ivory ________________________________________________________ Rosalin Hawking ________________________________________________________ Carolyn Stare DIARIOS Fculas_______________________________________________________ Vegetales _____________________________________________________ Lou Miner ________________________________________________________ Lcteos_______________________________________________________ Carnes________________________________________________________ Rosalin Hawking ________________________________________________________ Document Released: 03/31/2007 Document Revised: 03/16/2011 ExitCare Patient Information 2014 [place], [place].

## 2012-11-03 NOTE — Progress Notes (Signed)
Interpreter Wyvonnia Dusky for  MD,Rogers

## 2012-11-04 ENCOUNTER — Encounter: Payer: Self-pay | Admitting: Internal Medicine

## 2012-11-04 DIAGNOSIS — Z299 Encounter for prophylactic measures, unspecified: Secondary | ICD-10-CM | POA: Insufficient documentation

## 2012-11-04 DIAGNOSIS — Z3041 Encounter for surveillance of contraceptive pills: Secondary | ICD-10-CM | POA: Insufficient documentation

## 2012-11-04 NOTE — Assessment & Plan Note (Signed)
Not on statin therapy.  Lipid profile today.

## 2012-11-04 NOTE — Assessment & Plan Note (Signed)
Persistent upper abdominal skin rash due to HCTZ allergy but improving. Patient has been applying baby powder to affected area.  -advised patient that she can also purchase hydrocortisone cream over the counter to apply to area and hopefully speed healing time

## 2012-11-04 NOTE — Assessment & Plan Note (Signed)
Flu shot today 

## 2012-11-04 NOTE — Assessment & Plan Note (Signed)
Patient requesting oral contraceptives today.  Informed her that we will need to check a urine pregnancy test before prescribing this medication.  She is amenable and will return on 10/31 to provide specimen given that clinic lab already closed.   -informed pt about $9/month cost of OCPs at [facility] -will prescribe generic Loestrin pending negative urine pregnancy test

## 2012-11-04 NOTE — Progress Notes (Signed)
Patient ID: Robin Klein, female   DOB: Jul 29, 1970, 42 y.o.   MRN: [id number]   Subjective:   Patient ID: Robin Klein female   DOB: 1970/01/11 42 y.o.   MRN: [id number]  HPI: Robin Klein is a 42 y.o. woman with uncontrolled DM2, HTN, HL who presents to clinic for followup visit of her diabetes and hypertension; she also has an acute complaint of tongue pain and is requesting OCPs.   A1C today is 9.0%, unchanged from 8.9% in 05/2012.  Patient reports generally good compliance with her Lantus 40 units daily though she did not increase her dose to 45 units after last visit as requested.  She also did not take it Mon-Wed of this week since she needed a refill and had not picked it up yet.  Furthermore, she stopped metformin 10 days ago due to concern that it was causing her tongue pain (tongue pain did not resolve off this medicine).  She checks her blood sugar at home, usually twice daily, and she brought her meter in today which showed average blood glucose of 225.  She does report some dietary indiscretion including waking up at night eating toast with milk.  She reports 2-3 dizzy spells at home which she thinks may have been due to low blood sugar, but she only checked her blood pressure (always normal), not blood sugar during these episodes; no documented hypoglycemic readings on glucometer.  She is concerned that her amlodipine may be causing these dizzy spells.    With regard to her tongue pain, she states that this problem is chronic but has been worsening for the past 2 weeks.  It feels like thorns in her tongue but she has not noticed any ulcers.  She has seen a dentist recently but forgot to ask about her tongue pain at that time; she does have several metal crowns.  She points out resolving skin lesions on her upper abdomen from an allergic reaction to HCTZ.  She states that one of these was previously biopsied.  She has been applying baby powder to the  affected area and thinks it is improving.   Patient is requesting birth control today.  She has a history of amenorrhea, LMP approximately one month ago.  She is not trying to become pregnant but is sexually active and not currently using contraception.  She recently had vaginitis treated with fluconazole and mitronidazole, now resolved.    Denies any fever/chills, headache, chest pain, palpitations, abdominal pain, nausea/vomiting, diarrhea.   Past Medical History  Diagnosis Date  . Diabetes mellitus   . Hypertension   . Fatty liver disease, nonalcoholic   . Depression   . Sinusitis    Current Outpatient Prescriptions  Medication Sig Dispense Refill  . amLODipine (NORVASC) 10 MG tablet Take 1 tablet (10 mg total) by mouth daily.  30 tablet  11  . Blood Glucose Monitoring Suppl (WAVESENSE PRESTO PRO METER) DEVI Use to check blood sugar up to 2 times daily       . glucose blood (WAVESENSE PRESTO TEST) test strip Use to test blood sugar up to 2 times daily       . insulin glargine (LANTUS) 100 UNIT/ML injection Inject 0.45 mLs (45 Units total) into the skin daily. To control blood sugars  10 mL  11  . Insulin Syringe-Needle U-100 (GNP INSULIN SYRINGE) 31G X 5/16" 0.5 ML MISC Use to inject insulin once daily       . Lancets  MISC Use to check blood sugar as directed       . Prenatal Vit-Fe Fumarate-FA (PRENATAL 1 PLUS 1 PO) Take 1 tablet by mouth.        . metFORMIN (GLUCOPHAGE) 1000 MG tablet Take 1 tablet (1,000 mg total) by mouth 2 (two) times daily with a meal.  180 tablet  0   No current facility-administered medications for this visit.   Family History  Problem Relation Age of Onset  . Diabetes Mother   . Diabetes Father   . Cancer Paternal Grandmother    History   Social History  . Marital Status: Married    Spouse Name: N/A    Number of Children: N/A  . Years of Education: 7   Occupational History  .     Social History Main Topics  . Smoking status: Never Smoker   .  Smokeless tobacco: Never Used  . Alcohol Use: No  . Drug Use: No  . Sexual Activity: Yes    Birth Control/ Protection: None   Other Topics Concern  . Not on file   Social History Narrative   Financial assistance approved for 100% discount at [facility] and has [facility] card per [name]   12/16/2009   Recently gave birth to a son 04/2009.   Review of Systems: Review of Systems  Constitutional: Negative for fever, chills, weight loss and malaise/fatigue.  Eyes: Negative for blurred vision.  Respiratory: Negative for cough and shortness of breath.   Cardiovascular: Negative for chest pain, palpitations and leg swelling.  Gastrointestinal: Negative for heartburn, nausea, vomiting, abdominal pain, diarrhea, constipation and blood in stool.  Genitourinary: Negative for dysuria and frequency.  Musculoskeletal: Negative for myalgias.  Neurological: Positive for dizziness. Negative for loss of consciousness, weakness and headaches.    Objective:  Physical Exam: Filed Vitals:   11/03/12 1556  BP: 128/90  Pulse: 83  Temp: 97.7 F (36.5 C)  TempSrc: Oral  Height: 4\' 11"  (1.499 m)  Weight: 154 lb 6.4 oz (70.035 kg)  SpO2: 98%   General: alert, cooperative, and in no apparent distress HEENT: tongue non-erythematous but fissured appearing and without ulcers, one small white non-ulcerated lesion inside right cheek, oropharynx clear and non-erythematous; normal ear exam bilaterally; vision grossly intact  Neck: supple, no lymphadenopathy, JVD, or carotid bruits Lungs: clear to ascultation bilaterally, normal work of respiration, no wheezes, rales, ronchi Heart: regular rate and rhythm, no murmurs, gallops, or rubs Abdomen: soft, non-tender, non-distended, normal bowel sounds Extremities: 2+ DP/PT pulses bilaterally, no cyanosis, clubbing, or edema Neurologic: alert & oriented X3, cranial nerves II-XII intact, strength grossly intact, sensation intact to light touch  Assessment & Plan:    Patient discussed with Dr. Josem Kaufmann.  Please see problem-based assessment and plan.

## 2012-11-04 NOTE — Assessment & Plan Note (Addendum)
Assessment: Patient with chronic tongue pain (x 1 year), worsening in past 2 weeks.  No tongue swelling, difficulty swallowing, or shortness of breath.  On exam, tongue appears fissured but without ulcerated lesions. Considered treating empirically for HSV but given lack of ulcerated lesions, decided to hold off for now.  Also considered fluconazole given one small whitish lesion in right cheek but patient recently received fluconazole for yeast infection. HIV non-reactive in 11/2011 when patient began having this problem; consider repeating if this issue does not resolve.    Plan: -instructed pt to purchase chloraseptic spray over the counter and use as needed for pain relief -continue to monitor

## 2012-11-04 NOTE — Assessment & Plan Note (Signed)
BP controlled.  Continue amlodipine 10mg  daily.  If patient has recurrent dizzy spells that are not due to hypoglycemia, consider changing agent.  Note: pt allergic to HCTZ.

## 2012-11-04 NOTE — Assessment & Plan Note (Addendum)
A1C 9.0% today, unchanged from 8.9% in 05/2012.   -advised patient to go back on Lantus 45 untis daily and not miss any doses; in addition, advised her to restart metformin 1000 mg BID -reminded pt to bring her medicines and glucometer to all clinic visits -follow-up in next couple of weeks with Norm Parcel, in 2-3 months with me

## 2012-11-04 NOTE — Assessment & Plan Note (Signed)
Patient has history of depression and multiple pain/discomfort complaints today.  Tested pressure points for consideration of fibromyalgia diagnosis.  Discomfort at 8 sites (11 needed for diagnosis).  -follow-up on antidepressant status at next visit

## 2012-11-14 NOTE — Progress Notes (Signed)
I saw and evaluated the patient.  I personally confirmed the key portions of Dr. Roger's history and exam and reviewed pertinent patient test results.  The assessment, diagnosis, and plan were formulated together and I agree with the documentation in the resident's note. 

## 2013-01-12 ENCOUNTER — Ambulatory Visit: Payer: No Typology Code available for payment source

## 2013-01-16 ENCOUNTER — Ambulatory Visit (INDEPENDENT_AMBULATORY_CARE_PROVIDER_SITE_OTHER): Payer: No Typology Code available for payment source | Admitting: Internal Medicine

## 2013-01-16 ENCOUNTER — Encounter: Payer: Self-pay | Admitting: Internal Medicine

## 2013-01-16 ENCOUNTER — Ambulatory Visit: Payer: No Typology Code available for payment source

## 2013-01-16 VITALS — BP 127/88 | HR 86 | Temp 97.7°F | Ht 59.0 in | Wt 154.0 lb

## 2013-01-16 DIAGNOSIS — E119 Type 2 diabetes mellitus without complications: Secondary | ICD-10-CM

## 2013-01-16 DIAGNOSIS — M2550 Pain in unspecified joint: Secondary | ICD-10-CM | POA: Insufficient documentation

## 2013-01-16 DIAGNOSIS — R110 Nausea: Secondary | ICD-10-CM

## 2013-01-16 DIAGNOSIS — I10 Essential (primary) hypertension: Secondary | ICD-10-CM

## 2013-01-16 LAB — POCT GLYCOSYLATED HEMOGLOBIN (HGB A1C): HEMOGLOBIN A1C: 8.9

## 2013-01-16 LAB — POCT URINE PREGNANCY: Preg Test, Ur: NEGATIVE

## 2013-01-16 LAB — GLUCOSE, CAPILLARY
GLUCOSE-CAPILLARY: 260 mg/dL — AB (ref 70–99)
Glucose-Capillary: 220 mg/dL — ABNORMAL HIGH (ref 70–99)

## 2013-01-16 MED ORDER — METFORMIN HCL 1000 MG PO TABS: 1000.0000 mg | ORAL_TABLET | Freq: Two times a day (BID) | ORAL | Status: DC

## 2013-01-16 MED ORDER — INSULIN GLARGINE 100 UNIT/ML ~~LOC~~ SOLN: 45.0000 [IU] | Freq: Every day | SUBCUTANEOUS | Status: DC

## 2013-01-16 MED ORDER — AMLODIPINE BESYLATE 10 MG PO TABS: 10.0000 mg | ORAL_TABLET | Freq: Every day | ORAL | Status: DC

## 2013-01-16 NOTE — Assessment & Plan Note (Addendum)
Acute x 1 week of b/l knees & elbows with pruritis of these joints.  May be related to rheumatological disease, but will monitor for a few more weeks. If this persists, check ANA, ESR, CRP, RF.  Given pruritis, also check CMET.

## 2013-01-16 NOTE — Progress Notes (Signed)
Subjective:   Patient ID: Robin Klein female   DOB: 1970/08/16 43 y.o.   MRN: [id number]  Chief Complaint  Patient presents with  . Follow-up    ROUTINE OFFICE VISIT  . Nausea     X 1 MONTH  . Medication Refill    MEDICATION REFILL    HPI: [name] is a 43 y.o. spanish woman with h/o DM, HLD, HTN, and depression who presents for above reasons.  Regarding DM,  Lowest 81 (will get dizzy, better with juice, happens infrequently, once over the last year), highest 207, usually <155; compliant with lantus & metformin.  Regarding nausea, started 1 month prior, for about 3 days she had N/V, but now only nausea. Was taking a pill for the nausea, that helped, but cannot recall the name.  Script for her son.  LMP 11/29/12. At the beginning of the month, pain was at the liver, started to eat healthy again, so pain improved. 1-2 days of diarrhea in the middle of December. Felt constipated for the last 5d, but had BM this AM, describes BM has hard.  Nausea is sometimes worse with eating, not always.  Similar to past 2/4 pregnancies.  No increased belching. She does feel bloated x 2d.   Reports pruritic skin of b/l elbows, knees and forehead.  Started 1 week ago.  No obvious rash. No change in laundry detergent or body soap.  Also feels pain, discomfort over b/l knee & elbow joints. Never before. New lotion, but she doesn't think it is related due to joint pain and she doesn't apply lotion to knees.  In the morning, she feels that she has bone pain/stiffness in the fingers and swollen   Review of Systems: Constitutional: Denies fever, chills, diaphoresis, appetite change and fatigue. Was sick Dec 18-31 (fever,chills, sore throat, cough) HEENT: Denies photophobia, eye pain, redness, hearing loss, congestion, sore throat, rhinorrhea, sneezing, mouth sores, trouble swallowing, neck pain, neck stiffness and tinnitus. Lingering left ear pain + itchiness since DEc Respiratory:  Denies SOB, DOE, cough, chest tightness, and wheezing.  Cardiovascular: Denies chest pain, palpitations and leg swelling.  Gastrointestinal: per HPI  Genitourinary: Denies dysuria, urgency, frequency, hematuria, flank pain and difficulty urinating.  Musculoskeletal: Denies myalgias, back pain, and gait problem.  Skin: per HPI Neurological: Denies seizures, syncope, weakness, lightheadedness, numbness and headaches.    Past Medical History  Diagnosis Date  . Diabetes mellitus   . Hypertension   . Fatty liver disease, nonalcoholic   . Depression   . Sinusitis    Current Outpatient Prescriptions  Medication Sig Dispense Refill  . amLODipine (NORVASC) 10 MG tablet Take 1 tablet (10 mg total) by mouth daily.  30 tablet  11  . Blood Glucose Monitoring Suppl (WAVESENSE PRESTO PRO METER) DEVI Use to check blood sugar up to 2 times daily       . glucose blood (WAVESENSE PRESTO TEST) test strip Use to test blood sugar up to 2 times daily       . insulin glargine (LANTUS) 100 UNIT/ML injection Inject 0.45 mLs (45 Units total) into the skin daily. To control blood sugars  10 mL  11  . Insulin Syringe-Needle U-100 (GNP INSULIN SYRINGE) 31G X 5/16" 0.5 ML MISC Use to inject insulin once daily       . Lancets MISC Use to check blood sugar as directed       . metFORMIN (GLUCOPHAGE) 1000 MG tablet Take 1 tablet (1,000 mg total)  by mouth 2 (two) times daily with a meal.  180 tablet  0  . Prenatal Vit-Fe Fumarate-FA (PRENATAL 1 PLUS 1 PO) Take 1 tablet by mouth.         No current facility-administered medications for this visit.   Family History  Problem Relation Age of Onset  . Diabetes Mother   . Diabetes Father   . Cancer Paternal Grandmother    History   Social History  . Marital Status: Married    Spouse Name: N/A    Number of Children: N/A  . Years of Education: 7   Occupational History  .     Social History Main Topics  . Smoking status: Never Smoker   . Smokeless tobacco: Never  Used  . Alcohol Use: No  . Drug Use: No  . Sexual Activity: Yes    Birth Control/ Protection: None   Other Topics Concern  . Not on file   Social History Narrative   [facility] and [facility] per Rudell CobbDeborah Hill   12/16/2009   Recently gave birth to a son 04/2009.    Objective:  Physical Exam: Filed Vitals:   01/16/13 1104  BP: 127/88  Pulse: 86  Temp: 97.7 F (36.5 C)  TempSrc: Oral  Height: 4\' 11"  (1.499 m)  Weight: 154 lb (69.854 kg)  SpO2: 98%   General:  Appears as stated age, no acute distress HEENT: PERRL, EOMI, no scleral icterus Cardiac: RRR, no rubs, murmurs or gallops Pulm: clear to auscultation bilaterally, moving normal volumes of air Abd: soft, nontender throughout, nondistended, BS normoactive Ext: warm and well perfused, no pedal edema, no swollen joints Neuro: alert and oriented X3, cranial nerves II-XII grossly intact  Skin: no obvious rash on b/l knees and elbows  Assessment & Plan:  Case and care discussed with Dr. Meredith PelJoines.  Please see problem oriented charting for further details. Patient to return in 2 weeks to follow up A1c and monitor arthralgias.

## 2013-01-16 NOTE — Patient Instructions (Signed)
General Instructions: -Continue taking your lantus and metformin -If your joint pain persists in the next two weeks, we will draw blood work -Today we are checking your urine to see if you are pregnant -Please try to keep regular, soft bowel movements with over the counter fiber  Please be sure to bring all of your medications with you to every visit.  Should you have any new or worsening symptoms, please be sure to call the clinic at [phone].   Treatment Goals:  Goals (1 Years of Data) as of 01/16/13         As of Today 11/03/12 05/27/12 04/28/12 04/25/12     Weight    . Weight < 145 lb (65.772 kg)  154 lb (69.854 kg) 154 lb 6.4 oz (70.035 kg) 156 lb 12.8 oz (71.124 kg) 154 lb 6.4 oz (70.035 kg) 155 lb 8 oz (70.534 kg)      Progress Toward Treatment Goals:  Treatment Goal 01/16/2013  Hemoglobin A1C unable to assess  Blood pressure at goal    Self Care Goals & Plans:  Self Care Goal 01/16/2013  Manage my medications take my medicines as prescribed; bring my medications to every visit; refill my medications on time  Monitor my health check my feet daily  Eat healthy foods eat more vegetables; eat foods that are low in salt; drink diet soda or water instead of juice or soda  Be physically active take a walk every day

## 2013-01-16 NOTE — Assessment & Plan Note (Addendum)
May be secondary to pregnancy, as has been in the past.  Start with urine hcg.  If negative, may be secondary to constipation - advised regular BMs.  Less likely, but possible is gallbladder disease given description of RUQ, but no pain on exam today.  May consider RUQ US if persists (2011 abd US benign).  Also, had recent illness in Dec, may be post viral syndrome.  May also consider trial of gas-x or PPI.  Finally, may consider rheumatological etiology.  Addendum: urine HCG negative, if persists CMET at next visit.

## 2013-01-16 NOTE — Assessment & Plan Note (Signed)
Lab Results  Component Value Date   HGBA1C 9.0 11/03/2012   HGBA1C 8.9 04/25/2012   HGBA1C 9.3 12/24/2011     Assessment: Diabetes control: fair control Progress toward A1C goal:  unable to assess  Plan: Medications:  continue current medications Home glucose monitoring: qAM, once daily Instruction/counseling given: reminded to bring blood glucose meter & log to each visit and reminded to bring medications to each visit Other plans: Patient will return in 2 weeks for arthralgia follow up; at this visit, insulin can be titrated up if needed (A1c checked when patient was leaving)

## 2013-01-17 ENCOUNTER — Ambulatory Visit: Payer: No Typology Code available for payment source

## 2013-01-17 NOTE — Progress Notes (Signed)
Case discussed with Dr. Sharda soon after the resident saw the patient.  We reviewed the resident's history and exam and pertinent patient test results.  I agree with the assessment, diagnosis, and plan of care documented in the resident's note. 

## 2013-02-03 ENCOUNTER — Encounter: Payer: Self-pay | Admitting: Internal Medicine

## 2013-02-03 ENCOUNTER — Ambulatory Visit (INDEPENDENT_AMBULATORY_CARE_PROVIDER_SITE_OTHER): Payer: No Typology Code available for payment source | Admitting: Internal Medicine

## 2013-02-03 ENCOUNTER — Other Ambulatory Visit: Payer: Self-pay | Admitting: Internal Medicine

## 2013-02-03 VITALS — BP 115/82 | HR 80 | Temp 97.5°F | Ht 59.0 in | Wt 157.2 lb

## 2013-02-03 DIAGNOSIS — Z299 Encounter for prophylactic measures, unspecified: Secondary | ICD-10-CM

## 2013-02-03 DIAGNOSIS — I10 Essential (primary) hypertension: Secondary | ICD-10-CM

## 2013-02-03 DIAGNOSIS — R110 Nausea: Secondary | ICD-10-CM

## 2013-02-03 DIAGNOSIS — R21 Rash and other nonspecific skin eruption: Secondary | ICD-10-CM

## 2013-02-03 DIAGNOSIS — E119 Type 2 diabetes mellitus without complications: Secondary | ICD-10-CM

## 2013-02-03 DIAGNOSIS — Z3041 Encounter for surveillance of contraceptive pills: Secondary | ICD-10-CM

## 2013-02-03 DIAGNOSIS — E785 Hyperlipidemia, unspecified: Secondary | ICD-10-CM

## 2013-02-03 LAB — POCT URINE PREGNANCY: Preg Test, Ur: NEGATIVE

## 2013-02-03 MED ORDER — NORGESTIM-ETH ESTRAD TRIPHASIC 0.18/0.215/0.25 MG-35 MCG PO TABS: 1.0000 | ORAL_TABLET | Freq: Every day | ORAL | Status: DC

## 2013-02-03 MED ORDER — NORETHIN-ETH ESTRAD-FE BIPHAS 1 MG-10 MCG / 10 MCG PO TABS: 1.0000 | ORAL_TABLET | Freq: Every day | ORAL | Status: DC

## 2013-02-03 NOTE — Progress Notes (Signed)
Interpreter Wyvonnia DuskyGraciela Namihira for DR Aundria Rudogers

## 2013-02-03 NOTE — Progress Notes (Signed)
Patient ID: Robin Klein, female   DOB: 1970-04-30, 43 y.o.   MRN: [id number]   Subjective:   Patient ID: Robin Klein female   DOB: January 27, 1970 43 y.o.   MRN: [id number]  HPI: Robin Klein is a 43 y.o. woman with history of DM2 (A1C 8.9% on 01/16/13), HTN, HL who presents for follow-up.   Patient is doing much better today.  Her nausea has resolved, she even stated "I forgot I had that."  Denies constipation and diarrhea, having regular daily bowel movements.   In terms of the rash she was complaining of at last visit, she states this has also resolved.  She feels that her skin is dry overall, but she is not using a moisturizer.  She does have occasional itching on her elbows, mostly during the day if her elbow rubs against something.  She has a small patch on her right foot as well where new shoes rubbed her foot and created a callous; this is sometimes itchy as well.  Denies any pain.   With regard to her diabetes, she reports good compliance with Lantus 45 units at night, metformin 1000 mg BID.  She is checking her blood sugar daily in the morning before breakfast and brought her meter to clinic today.  Denies symptoms of hyper or hypoglycemia.   She would like prescription for OCPs today.  States that she has had her period since 1/26, anticipates that it will be over tomorrow.  She is amenable to a urine pregnancy test today.   Past Medical History  Diagnosis Date  . Diabetes mellitus   . Hypertension   . Fatty liver disease, nonalcoholic   . Depression   . Sinusitis    Current Outpatient Prescriptions  Medication Sig Dispense Refill  . amLODipine (NORVASC) 10 MG tablet Take 1 tablet (10 mg total) by mouth daily.  30 tablet  11  . aspirin 81 MG tablet Take 81 mg by mouth daily.      . Blood Glucose Monitoring Suppl (WAVESENSE PRESTO PRO METER) DEVI Use to check blood sugar up to 2 times daily       . glucose blood (WAVESENSE PRESTO TEST) test strip  Use to test blood sugar up to 2 times daily       . insulin glargine (LANTUS) 100 UNIT/ML injection Inject 0.45 mLs (45 Units total) into the skin daily. To control blood sugars  10 mL  11  . Insulin Syringe-Needle U-100 (GNP INSULIN SYRINGE) 31G X 5/16" 0.5 ML MISC Use to inject insulin once daily       . Lancets MISC Use to check blood sugar as directed       . metFORMIN (GLUCOPHAGE) 1000 MG tablet Take 1 tablet (1,000 mg total) by mouth 2 (two) times daily with a meal.  180 tablet  3  . Prenatal Vit-Fe Fumarate-FA (PRENATAL 1 PLUS 1 PO) Take 1 tablet by mouth.         No current facility-administered medications for this visit.   Family History  Problem Relation Age of Onset  . Diabetes Mother   . Diabetes Father   . Cancer Paternal Grandmother    History   Social History  . Marital Status: Married    Spouse Name: N/A    Number of Children: N/A  . Years of Education: 7   Occupational History  .     Social History Main Topics  . Smoking status: Never Smoker   .  Smokeless tobacco: Never Used  . Alcohol Use: No  . Drug Use: No  . Sexual Activity: Yes    Birth Control/ Protection: None   Other Topics Concern  . Not on file   Social History Narrative   [facility] and [facility] per Rudell CobbDeborah Hill   12/16/2009   Recently gave birth to a son 04/2009.   Review of Systems: Review of Systems  Constitutional: Negative for fever and malaise/fatigue.  HENT: Negative for sore throat.   Eyes: Negative for blurred vision.  Respiratory: Negative for cough and shortness of breath.   Cardiovascular: Negative for chest pain, palpitations and leg swelling.  Gastrointestinal: Negative for heartburn, nausea, vomiting, abdominal pain, diarrhea, constipation and blood in stool.  Genitourinary: Negative for dysuria.  Musculoskeletal: Negative for falls.  Skin: Positive for itching. Negative for rash.       On elbows and one area on lateral  right foot  Neurological: Negative for dizziness, loss of consciousness, weakness and headaches.    Objective:  Physical Exam: Filed Vitals:   02/03/13 1317  BP: 115/82  Pulse: 80  Temp: 97.5 F (36.4 C)  TempSrc: Oral  Weight: 157 lb 3.2 oz (71.305 kg)  SpO2: 99%   PEX General: alert, cooperative, and in no apparent distress HEENT: NCAT, vision grossly intact, oropharynx clear and non-erythematous  Neck: supple, no lymphadenopathy Lungs: clear to ascultation bilaterally, normal work of respiration, no wheezes, rales, ronchi Heart: regular rate and rhythm, no murmurs, gallops, or rubs Abdomen: soft, non-tender, non-distended, normal bowel sounds Extremities: 2+ DP/PT pulses bilaterally, no cyanosis, clubbing, or edema Neurologic: alert & oriented X3, cranial nerves II-XII intact, strength grossly intact, sensation intact to light touch  Assessment & Plan:  Patient discussed with Dr. Criselda PeachesMullen.  Please see problem-based assessment and plan.

## 2013-02-03 NOTE — Progress Notes (Signed)
[facility]  [facility] does not carry Loestrin but do have Ortho-tricyclen which I will fax in prescription for now.

## 2013-02-03 NOTE — Patient Instructions (Addendum)
Follow-up with Dr. Aundria Rud beginning of March.   Keep up the good work on taking your medicines, including insulin!  We are prescribing you birth control pills today.  You should start taking them on Sunday 2/1.  Remember to take them at the same time each day for maximum efficacy.  Remember, these do not protect against sexually transmitted infections.  We will discuss how you are doing with the birth control pills at your follow-up appointment in just over a month.   We are checking a urine pregnancy test today just to be safe.  If this happens to come back positive (though this is very unlikely), I will call you because you should not take birth control pills if that is the case.   For your dry skin, you should use a good moisturizer daily (I like the ones called CETAPHIL and CERAVE).  In addition, you can buy CORTISONE cream over the counter to apply to the rough patches on your foot.  You can also buy BENADRYL cream over the counter to apply to your elbows when they feel itchy if the moisturizer is not enough.   Keep working on eating a healthy diet and exercising daily.   Uso de los Civil Service fast streamer (Oral Contraception Use) Los anticonceptivos orales (ACO) son medicamentos que se utilizan para Location manager. Su funcin es [employer] ovarios liberen vulos. Las hormonas de los ACO tambin hacen que el moco cervical se haga ms espeso, lo que evita que el esperma ingrese al tero. Tambin hacen que la membrana que recubre internamente al tero se vuelva ms fina, lo que no permite que el huevo fertilizado se adhiera a la pared del tero. Los ACO son muy efectivos cuando se toman exactamente como se prescriben. Sin embargo, no previenen contra las enfermedades de transmisin sexual (ETS). La prctica del sexo seguro, como el uso de preservativos, junto con los ACO, [place] a prevenir ese tipo de enfermedades. Antes de tomar ACO, debe hacerse un examen fsico y un Papan[place]au. El mdico  podr indicarle anlisis de [place], si es necesario. El mdico se asegurar de que usted sea [place] buena candidata para usar anticonceptivos orales. Converse con su mdico acerca de los posibles efectos secundarios de los ACO que podran recetarle. Cuando se inicia el uso de ACO, se pueden tomar durante 2 a 3 meses para que el cuerpo se adapte a los cambios en los niveles hormonales en el cuerpo.  CMO TOMAR LOS ANTICONCEPTIVOS ORALES El mdico le indicar como comenzar a Building services engineer de ACO. De lo contrario usted puede:   Engineering geologist de inicio del ciclo menstrual. No necesitar proteccin anticonceptiva adicional al Investment banker, operational.   Comenzar Financial risk analyst domingo luego de su perodo menstrual, o Medical laboratory scientific officer en que adquiere el [employer]. En estos casos deber [employer] proteccin anticonceptiva [employer] primeros 7 das del [place].   Comenzar a tomarlos en cualquier momento del ciclo. Si toma el anticonceptivo dentro de los [address] de iniciado el perodo, Theme park manager protegida de quedar embarazada inmediatamente. En este caso, no necesitar una forma adicional de anticonceptivos. Si comienza en cualquier otro momento del ciclo menstrual, necesitar usar otra forma de anticonceptivo durante 7 [address]  [address]. Si sus ACO son del tipo de los [employer], podrn impedir el embarazo despus de tomarlas por 2 das (48 horas). Luego de comenzar a tomar los ACO:   Si olvid de tomar 1 pldora, tmela tan pronto como lo recuerde. Tome la  siguiente pldora a la hora habitual.   Si dej de tomar 2 o ms pldoras, comunquese con su mdico ya que diferentes pldoras tienen diferentes instrucciones para las dosis que no se han tomado. Si olvida tomar 2 o ms pldoras, utilice un mtodo anticonceptivo adicional hasta que comience su prximo perodo menstrual.   Si utiliza el envase de 28 pldoras que contienen pldoras inactivas y Venezuelaolvida tomar 1 de las ltimas 7 (pldoras sin hormonas), sto no  tiene Quarry managerimportancia. Simplemente deseche el resto de las pldoras que no contienen hormonas y comience un nuevo envase.  No importa cuando comience a tomar los anticonceptivos, siempre empiece un nuevo envase el mismo da de la [place]. Tenga un envase extra de ACO y use un mtodo anticonceptivo adicional para Restaurant manager, fast foodel caso en que se olvide de tomar algunas pldoras o pierda la caja.  INSTRUCCIONES PARA EL CUIDADO EN EL HOGAR   No fume.   Use siempre un condn para protegerse contra las enfermedades de transmisin sexual. Los ACO no protegen contra las enfermedades de transmisin sexual.   Use un almanaque para Thrivent Financialmarcar los das de su perodo menstrual.   Lea la informacin y consejos que vienen con las ACO. Hable con el profesional si tiene dudas.  SOLICITE ATENCIN MDICA SI:   Presenta nuseas o vmitos.   Tiene flujo o sangrado vaginal anormal.   Aparece una erupcin cutnea.   No tiene el perodo menstrual.   Pierde el cabello.   Necesita tratamiento por cambios en su estado de nimo o por depresin.   Se siente mareada al Liberty Mutualtomar los ACO.   Comienza a aparecer acn con el uso de los ACO.   Ardelle AntonQueda embarazada.  SOLICITE ATENCIN MDICA DE INMEDIATO SI:   Siente dolor en el pecho.   Le falta el aire.   Le duele mucho la cabeza y no puede Human resources officercontrolar el dolor.   Siente adormecimiento o tiene dificultad para hablar.   Tiene problemas de visin.   Presenta dolor, inflamacin o hinchazn en las piernas.  Document Released: 12/11/2010 Document Revised: 08/24/2012 [facility] Patient Information 2014 [place]ExitCare, [place]LLC.

## 2013-02-03 NOTE — Assessment & Plan Note (Signed)
Lab Results  Component Value Date   HGBA1C 8.9 01/16/2013   HGBA1C 9.0 11/03/2012   HGBA1C 8.9 04/25/2012     Assessment: Diabetes control:  fair Progress toward A1C goal:   stable  Plan: Medications:  continue current medications (Lantus 45 units daily, metformin 1000 mg BID) Home glucose monitoring: Frequency:  daily Timing:  before breakfast Instruction/counseling given: reminded to bring blood glucose meter & log to each visit Other plans: Considered increasing Lantus to 48 units daily today but given several AM blood glucoses in 110s, decided against it.  Patient will continue to work on healthy diet and exercise.  Will reassess at follow-up visit in March.

## 2013-02-03 NOTE — Assessment & Plan Note (Signed)
Patient requesting OCPs.  Negative urine pregnancy test today.  Prescribed Loestrin 1-10 for patient to start on Sunday 2/1 (day after period over).  Instructed patient to take birth control at same time every day and counseled her that these do not protect against STDs.  She will follow-up in just over a month to see how she is tolerating.

## 2013-02-03 NOTE — Assessment & Plan Note (Signed)
LDL 92 on 11/03/12 (previously 68 in 06/2011).  Given goal of <70, will initiate statin at follow-up visit in early March.

## 2013-02-03 NOTE — Assessment & Plan Note (Addendum)
Patient complaining of itchy rash on bilateral elbows and knees at last visit as well as arthralgias.  She states that rash has since resolved, no pain.  She feels that her skin overall is dry and has not been using moisturizer.  On exam, elbows without any rash; one patch of thickened skin on lateral foot where new shoe reportedly rubbed but no concerning features.  Recommended daily moisturizer (such as CETAPHIL or CERAVE) applied all over skin.  In addition, recommended CORTISONE cream applied to the rough patch on foot and BENADRYL cream applied to elbows as needed for itchiness if moisturizer is not enough.  Will reassess at next visit.

## 2013-02-03 NOTE — Assessment & Plan Note (Signed)
BP well controlled at 115/82 today.  Continue amlodipine 10 mg daily.  No longer complaining of dizziness.

## 2013-02-03 NOTE — Assessment & Plan Note (Signed)
Resolved

## 2013-02-06 NOTE — Progress Notes (Signed)
Case discussed with Dr. Rogers at the time of the visit.  We reviewed the resident's history and exam and pertinent patient test results.  I agree with the assessment, diagnosis, and plan of care documented in the resident's note. 

## 2013-02-12 ENCOUNTER — Emergency Department (INDEPENDENT_AMBULATORY_CARE_PROVIDER_SITE_OTHER)
Admission: EM | Admit: 2013-02-12 | Discharge: 2013-02-12 | Disposition: A | Discharge status: Home / Self Care | Payer: No Typology Code available for payment source | Source: Home / Self Care | Attending: Emergency Medicine | Admitting: Emergency Medicine

## 2013-02-12 ENCOUNTER — Encounter (HOSPITAL_COMMUNITY): Payer: Self-pay | Admitting: Emergency Medicine

## 2013-02-12 DIAGNOSIS — J020 Streptococcal pharyngitis: Secondary | ICD-10-CM

## 2013-02-12 LAB — POCT RAPID STREP A: STREPTOCOCCUS, GROUP A SCREEN (DIRECT): POSITIVE — AB

## 2013-02-12 MED ORDER — AMOXICILLIN 500 MG PO CAPS: 500.0000 mg | ORAL_CAPSULE | Freq: Three times a day (TID) | ORAL | Status: DC

## 2013-02-12 NOTE — Discharge Instructions (Signed)
Most upper respiratory infections are caused by viruses and do not require antibiotics.  We try to save the antibiotics for when we really need them to prevent bacteria from developing resistance to them.  Here are a few hints about things that can be done at home to help get over an upper respiratory infection quicker: ° °Get extra sleep and extra fluids.  Get 7 to 9 hours of sleep per night and 6 to 8 glasses of water a day.  Getting extra sleep keeps the immune system from getting run down.  Most people with an upper respiratory infection are a little dehydrated.  The extra fluids also keep the secretions liquified and easier to deal with.  Also, get extra vitamin C.  4000 mg per day is the recommended dose. °For the aches, headache, and fever, acetaminophen or ibuprofen are helpful.  These can be alternated every 4 hours.  People with liver disease should avoid large amounts of acetaminophen, and people with ulcer disease, gastroesophageal reflux, gastritis, congestive heart failure, chronic kidney disease, coronary artery disease and the elderly should avoid ibuprofen. °For nasal congestion try Mucinex-D, or if you're having lots of sneezing or clear nasal drainage use Zyrtec-D. People with high blood pressure can take these if their blood pressure is controlled, if not, it's best to avoid the forms with a "D" (decongestants).  You can use the plain Mucinex, Allegra, Claritin, or Zyrtec even if your blood pressure is not controlled.   °A Saline nasal spray such as Ocean Spray can also help.  You can add a decongestant sprays such as Afrin, but you should not use the decongestant sprays for more than 3 or 4 days since they can be habituating.  Breathe Rite nasal strips can also offer a non-drug alternative treatment to nasal congestion, especially at night. °For people with symptoms of sinusitis, sleeping with your head elevated can be helpful.  For sinus pain, moist, hot compresses to the face may provide some  relief.  Many people find that inhaling steam as in a shower or from a pot of steaming water can help. °For any viral infection, zinc containing lozenges such as Cold-Eze or Zicam are helpful.  Zinc helps to fight viral infection.  Hot salt water gargles (8 oz of hot water, 1/2 tsp of table salt, and a pinch of baking soda) can give relief as well as hot beverages such as hot tea.  Sucrets extra strength lozenges will help the sore throat.  °For the cough, take Delsym 2 tsp every 12 hours.  It has also been found recently that Aleve can help control a cough.  The dose is 1 to 2 tablets twice daily with food.  This can be combined with Delsym. (Note, if you are taking ibuprofen, you should not take Aleve as well--take one or the other.) °A cool mist vaporizer will help keep your mucous membranes from drying out.  ° °It's important when you have an upper respiratory infection not to pass the infection to others.  This involves being very careful about the following: ° °Frequent hand washing or use of hand sanitizer, especially after coughing, sneezing, blowing your nose or touching your face, nose or eyes. °Do not shake hands or touch anyone and try to avoid touching surfaces that other people use such as doorknobs, shopping carts, telephones and computer keyboards. °Use tissues and dispose of them properly in a garbage can or ziplock bag. °Cough into your sleeve. °Do not let others eat or   drink after you. ° °It's also important to recognize the signs of serious illness and get evaluated if they occur: °Any respiratory infection that lasts more than 7 to 10 days.  Yellow nasal drainage and sputum are not reliable indicators of a bacterial infection, but if they last for more than 1 week, see your doctor. °Fever and sore throat can indicate strep. °Fever and cough can indicate influenza or pneumonia. °Any kind of severe symptom such as difficulty breathing, intractable vomiting, or severe pain should prompt you to see  a doctor as soon as possible. ° ° °Your body's immune system is really the thing that will get rid of this infection.  Your immune system is comprised of 2 types of specialized cells called T cells and B cells.  T cells coordinate the array of cells in your body that engulf invading bacteria or viruses while B cells orchestrate the production of antibodies that neutralize infection.  Anything we do or any medications we give you, will just strengthen your immune system or help it clear up the infection quicker.  Here are a few helpful hints to improve your immune system to help overcome this illness or to prevent future infections: °· A few vitamins can improve the health of your immune system.  That's why your diet should include plenty of fruits, vegetables, fish, nuts, and whole grains. °· Vitamin A and bet-carotene can increase the cells that fight infections (T cells and B cells).  Vitamin A is abundant in dark greens and orange vegetables such as spinach, greens, sweet potatoes, and carrots. °· Vitamin B6 contributes to the maturation of white blood cells, the cells that fight disease.  Foods with vitamin B6 include cold cereal and bananas. °· Vitamin C is credited with preventing colds because it increases white blood cells and also prevents cellular damage.  Citrus fruits, peaches and green and red bell peppers are all hight in vitamin C. °· Vitamin E is an anti-oxidant that encourages the production of natural killer cells which reject foreign invaders and B cells that produce antibodies.  Foods high in vitamin E include wheat germ, nuts and seeds. °· Foods high in omega-3 fatty acids found in foods like salmon, tuna and mackerel boost your immune system and help cells to engulf and absorb germs. °· Probiotics are good bacteria that increase your T cells.  These can be found in yogurt and are available in supplements such as Culturelle or Align. °· Moderate exercise increases the strength of your immune  system and your ability to recover from illness.  I suggest 3 to 5 moderate intensity 30 minute workouts per week.   °· Sleep is another component of maintaining a strong immune system.  It enables your body to recuperate from the day's activities, stress and work.  My recommendation is to get between 7 and 9 hours of sleep per night. °· If you smoke, try to quit completely or at least cut down.  Drink alcohol only in moderation if at all.  No more than 2 drinks daily for men or 1 for women. °· Get a flu vaccine early in the fall or if you have not gotten one yet, once this illness has run its course.  If you are over 65, a smoker, or an asthmatic, get a pneumococcal vaccine. °· My final recommendation is to maintain a healthy weight.  Excess weight can impair the immune system by interfering with the way the immune system deals with invading viruses or   bacteria.   Angina por estreptococos (Strep Throat)  La angina estreptocccica es una infeccin en la garganta causada por una bacteria llamada Streptococcus pyogenes. El mdico la llamar "amigdalitis" o "faringitis" estreptocccica, segn haya signos de inflamacin en las amgdalas o en la zona posterior de la garganta. Es ms frecuente en nios entre los 5 y los 15 aos durante los meses de fro, Biomedical engineerpero puede ocurrir a Dealerpersonas de Actuarycualquier edad. La infeccin se transmite de persona a persona (contagio) a travs de la tos, el estornudo u otro contacto cercano.  SNTOMAS  Fiebre o escalofros.  La garganta o las Goodyear Tireamgdalas le duelen y estn inflamadas.  Dolor o dificultad para tragar.  Puntos blancos o amarillos en las amgdalas o la garganta.  Ganglios linfticos hinchados a los lados del cuello o debajo de la [place]rid[place]andbula.  Erupcin rojiza en todo el cuerpo (poco comn). DIAGNSTICO Diferentes infecciones pueden causar los mismos sntomas. Para confirmar el diagnstico deber Assuranthacerse anlisis, y as podrn prescribirle el tratamiento adecuado. La  "prueba rpida de estreptococo" ayudar al mdico a hacer el diagnstico en algunos minutos. Si no se dispone de la prueba, se har un rpido hisopado de la zona afectada para hacer un cultivo de las secreciones de la garganta. Si se hace un cultivo, los resultados estarn disponibles en Henry Scheinuno o dos das.  TRATAMIENTO El estreptococo de garganta se trata con antibiticos. INSTRUCCIONES PARA EL CUIDADO DOMICILIARIO  Haga grgaras con 1 cucharadita de sal en 1 taza de agua tibia, 3  4 veces por da o cuando lo crea necesario para sentirse mejor.  Los miembros de la familia que tambin tengan dolor en la garganta deben ser evaluados y tratados con antibiticos si tienen la infeccin.  Asegrese de que todas las personas de su casa se laven Longs Drug Storesbien las manos.  No comparta alimentos, tazas ni utensilios personales que puedan contagiar la infeccin.  Coma alimentos blandos hasta que el dolor de garganta mejore.  Beba gran cantidad de lquido para mantener la orina de tono claro o color amarillo plido. Esto ayudar a Agricultural engineerprevenir la deshidratacin.  Debe hacer reposo.  No concurra a la escuela o la trabajo hasta que haya tomado los antibiticos durante 24 horas.  Utilice los medicamentos de venta libre o de prescripcin para Chief Technology Officerel dolor, Environmental health practitionerel malestar o la Penitasfiebre, segn se lo indique el profesional que lo asiste.  Si le han recetado medicamentos, tmelos segn las indicaciones. Finalice la prescripcin completa, aunque se sienta mejor. SOLICITE ATENCIN MDICA SI:  Los ganglios del cuello siguen agrandados.  Aparece una erupcin cutnea, tos o dolor de odos.  Tiene un catarro verde, amarillo amarronado o esputo sanguinolento.  Siente dolor o Dentistmalestar que no puede controlar con los medicamentos.  Los sntomas parecen empeorar en vez de mejorar. SOLICITE ATENCIN MDICA DE INMEDIATO SI:  Presenta algn nuevo sntoma, como vmitos, dolor de cabeza intenso, rigidez o dolor en el cuello, dolor en el  pecho o problemas respiratorios o dificultad para tragar.  Presenta dolor de garganta intenso, babeo o cambios en la voz.  Siente que el cuello se hincha o la piel de esa zona se vuelve roja y sensible.  Tiene fiebre.  Tiene signos de deshidratacin, como fatiga, boca seca y disminucin de la [place].  Comienza a sentir mucho sueo, o no puede despertarse bien. Document Released: 10/01/2004 Document Revised: 12/09/2011 [facility] Patient Information 2014 [place] GrantExitCare, MarylandLLC.

## 2013-02-12 NOTE — ED Provider Notes (Signed)
Chief Complaint   Chief Complaint  Patient presents with  . Sore Throat    History of Present Illness   Robin Klein is a 43 year old female who's had a two-week history of intermittent symptoms of sore throat, pain on swallowing, bilateral ear pain, subjective fever, fatigue, nasal congestion, cough, nausea, vomiting, abdominal pain. She has had no exposure to strep. No prior history of strep throat or tonsillitis.   Review of Systems   Other than as noted above, the patient denies any of the following symptoms. Systemic:  No fever, chills, sweats, myalgias, or headache. Eye:  No redness, pain or drainage. ENT:  No earache, nasal congestion, sneezing, rhinorrhea, sinus pressure, sinus pain, or post nasal drip. Lungs:  No cough, sputum production, wheezing, shortness of breath, or chest pain. GI:  No abdominal pain, nausea, vomiting, or diarrhea. Skin:  No rash.  PMFSH   Past medical history, family history, social history, meds, allergies, and nurse's notes were reviewed.  There is no known exposure to strep or mono.  No prior history of step or mono.  The patient denies use of tobacco. She has a history of diabetes and hypertension. She's allergic to hydrochlorothiazide. She takes metformin, Lantus, aspirin, and amlodipine.  Physical Exam     Vital signs:  BP 125/89  Pulse 99  Temp(Src) 98.1 F (36.7 C) (Oral)  Resp 17  SpO2 97%  LMP 02/10/2013 General:  Alert, in no distress. Phonation was normal, no drooling, and patient was able to handle secretions well.  Eye:  No conjunctival injection or drainage. Lids were normal. ENT:  TMs and canals were normal, without erythema or inflammation.  Nasal mucosa was clear and uncongested, without drainage.  Mucous membranes were moist.  Exam of pharynx shows tonsils to be enlarged and red, right more so than left without any exudate.  There were no oral ulcerations or lesions. There was no bulging of the tonsillar pillars,  and the uvula was midline. Neck:  Supple, no adenopathy, tenderness or mass. Lungs:  No respiratory distress.  Lungs were clear to auscultation, without wheezes, rales or rhonchi.  Breath sounds were clear and equal bilaterally.  Heart:  Regular rhythm, without gallops, murmers or rubs. Skin:  Clear, warm, and dry, without rash or lesions.  Labs   Results for orders placed during the hospital encounter of 02/12/13  POCT RAPID STREP A ([facility]RG CARE ONLY)      Result Value Range   Streptococcus, Group A Screen (Direct) POSITIVE (*) NEGATIVE   Assessment   The encounter diagnosis was Strep throat.  There is no evidence of a peritonsillar abscess.    Plan     1.  Meds:  The following meds were prescribed:   Discharge Medication List as of 02/12/2013  4:17 PM    START taking these medications   Details  amoxicillin (AMOXIL) 500 MG capsule Take 1 capsule (500 mg total) by mouth 3 (three) times daily., Starting 02/12/2013, Until Discontinued, Normal        2.  Patient Education/Counseling:  The patient was given appropriate handouts, self care instructions, and instructed in symptomatic relief, including hot saline gargles, throat lozenges, infectious precautions, and need to trade out toothbrush.    3.  Follow up:  The patient was told to follow up here if no better in 3 to 4 days, or sooner if becoming worse in any way, and given some red flag symptoms such as difficulty swallowing or breathing which would  prompt immediate return.       Reuben Likesavid C Eward Rutigliano, MD 02/12/13 [phone]

## 2013-02-12 NOTE — ED Notes (Signed)
Pt c/o sore throat and ear pain x 2 weeks. Pt has been taking tylenol and ibuprofen for pain. Last dose this morning around 10 am. Pt reports she has headaches. Pt is alert and oriented and in no acute distress.

## 2013-03-23 ENCOUNTER — Ambulatory Visit (INDEPENDENT_AMBULATORY_CARE_PROVIDER_SITE_OTHER): Payer: No Typology Code available for payment source | Admitting: Internal Medicine

## 2013-03-23 ENCOUNTER — Encounter: Payer: Self-pay | Admitting: Internal Medicine

## 2013-03-23 VITALS — BP 133/88 | HR 89 | Temp 97.0°F | Ht 59.0 in | Wt 155.9 lb

## 2013-03-23 DIAGNOSIS — E785 Hyperlipidemia, unspecified: Secondary | ICD-10-CM

## 2013-03-23 DIAGNOSIS — Z3041 Encounter for surveillance of contraceptive pills: Secondary | ICD-10-CM

## 2013-03-23 DIAGNOSIS — E119 Type 2 diabetes mellitus without complications: Secondary | ICD-10-CM

## 2013-03-23 DIAGNOSIS — R21 Rash and other nonspecific skin eruption: Secondary | ICD-10-CM

## 2013-03-23 DIAGNOSIS — I10 Essential (primary) hypertension: Secondary | ICD-10-CM

## 2013-03-23 LAB — POCT URINE PREGNANCY: PREG TEST UR: NEGATIVE

## 2013-03-23 LAB — GLUCOSE, CAPILLARY: GLUCOSE-CAPILLARY: 224 mg/dL — AB (ref 70–99)

## 2013-03-23 NOTE — Assessment & Plan Note (Addendum)
Patient discontinued OCP use 2 weeks ago due to headaches.  Has not had headaches since.  Also states she had not had a menstrual period since last week of January.  Negative urine pregnancy test today.  Patient is not interested in gyn referral for possible IUD placement.  States she prefers to use condoms.

## 2013-03-23 NOTE — Assessment & Plan Note (Addendum)
Patient reports good compliance with Lantus 45 units qhs and metformin 1000 mg BID.  She has noticed that her blood sugars are running high lately (180-210) but did not bring her meter today.  Considered increasing Lantus to 48 units but decided against it without seeing fasting blood sugar readings (especially given morning lows seen at last visit).  Robin Klein to call patient in 1 week to ask her to read out blood sugar readings.  She will let me know what they are so we can adjust medications accordingly.  Urine MAB:Cr today.  Follow-up in 1 month, reminded patient to bring meter to that visit.  A1C at that time as well.

## 2013-03-23 NOTE — Progress Notes (Signed)
Interpreter Leven Hoel Namihira for Dr Rogers 

## 2013-03-23 NOTE — Patient Instructions (Addendum)
Please follow-up in 1 month.   Keep up the good work taking your medicines as prescribed.  [name] will call you next week so you can read your blood sugar readings to her so we can decide if you need to increase your insulin or add another oral medicine.   Your urine pregnancy test was negative.   Diabetes y Doroteo Glassmanactividad fsica (Diabetes and Exercise) Hacer actividad fsica con regularidad es muy importante. No se trata slo de perder peso. Tiene muchos otros beneficios, como por ejemplo:  Mejorar el Y-O Ranchestado de Catawbaagilidad, flexibilidad y resistencia.  Aumenta la densidad sea.  Ayuda a Art gallery managercontrolar el peso.  Disminuye la Art gallery managergrasa corporal.  Aumenta la fuerza muscular.  Reduce el estrs y las tensiones.  Mejora el estado de salud general. Las personas diabticas que realizan actividad fsica tienen beneficios adicionales debido al ejercicio:  Reduce el apetito.  Mejora la utilizacin del azcar (glucosa) por parte del organismo.  Ayuda a disminuir o Information systems managercontrolar la glucosa en sangre.  Disminuye la presin arterial.  Ayuda a disminuir los lpidos en sangre (colesterol y triglicridos).  Mejora el uso de la insulina por parte del organismo porque:  Aumenta la sensibilidad del organismo a la insulina.  Reduce las necesidades de insulina del organismo.  Disminuye el riesgo de enfermedad cardaca por la actividad fsica.  Disminuye el colesterol y los triglicridos.  Aumenta los niveles de colesterol bueno (como las lipoprotenas de alta densidad [HDL]) en el organismo.  Disminuye los niveles de glucosa en [place]. SU PLAN DE ACTIVIDAD  Elija una actividad que disfrute y establezca objetivos realistas. Su mdico o educador en diabetes podrn ayudarlo a Tax adviserrealizar una actividad que lo beneficie. Podr dividir las actividades en 2 o 3 sesiones a lo Paediatric nurselargo del da. Hacer esto es tan bueno como hacer una sesin prolongada. Las ideas para los ejercicios incluyen:  Lleve a Games developerpasear el  perro.  Utilice las Microbiologistescaleras en lugar del ascensor.  Baile su cancin favorita.  Haga sus ejercicios favoritos con Leisure centre managerun amigo. RECOMENDACIONES PARA REALIZAR EJERCICIOS CUANDO SE TIENE DIABETES TIPO 1 O TIPO 2   Controle la glucosa en sangre antes de comenzar. Si el nivel de glucosa en sangre es de ms de 240 mg/dl, controle las cetonas en la [place]orina. Si hay cetonas, no realice actividad fsica.  Evite inyectarse insulina en las zonas del cuerpo que ejercitar. Por ejemplo, evite inyectarse insulina en:  Los brazos, si juega al tenis.  Las piernas, si corre.  Lleve un registro de:  Alimentos que consume antes y despus de Tour managerhacer el ejercicio.  Los momentos esperables de picos de accin de la insulina.  Los niveles de glucosa en sangre antes y despus de hacer ejercicios.  El tipo y cantidad de [place]actividad fsica.  Revise los registros con su mdico. El mdico lo ayudar a Environmental education officerdesarrollar pautas para ajustar la cantidad de alimento y las cantidades de insulina antes y despus de Radio producerhacer ejercicios.  Si toma insulina o agentes hipoglucemiantes por va oral, observe si hay signos y sntomas de hipoglucemia. Ellos son:  Mareos.  Temblores.  Sudoracin.  Escalofros.  Confusin.  Beba gran cantidad de agua mientras hace ejercicios para evitar la deshidratacin o el infarto. Durante la actividad fsica se pierde Data processing manageragua corporal.  Comente con su mdico antes de comenzar un programa de actividad fsica para verificar que sea seguro para usted. Recuerde, cualquier actividad es mejor que ninguna. Document Released: 01/11/2007 Document Revised: 08/24/2012 [facility] Patient Information 2014 [place]Exit[place], [place].

## 2013-03-23 NOTE — Assessment & Plan Note (Signed)
Decided against initiating statin therapy as LDL < 100.

## 2013-03-23 NOTE — Assessment & Plan Note (Signed)
BP nearly controlled at 132/88 today.  Continue amlodipine 10 mg daily.  Note: patient has allergies to both HCTZ (rash) and lisinopril (?angioedema).

## 2013-03-23 NOTE — Progress Notes (Signed)
Patient ID: Robin Klein, female   DOB: [birth year], 43 y.o.   MRN: [id number]   Subjective:   Patient ID: Robin Klein female   DOB: 10/25/1970 43 y.o.   MRN: [id number]  HPI: [name] is a 43 y.o. woman with history of DM2 (A1C 8.9% on 01/16/13), HTN, HL who presents for follow-up.   With regard to her diabetes, she reports good compliance in general with Lantus 45 units at night, metformin 1000 mg BID.  She missed two doses of Lantus last week and sometimes has trouble remembering if she has taken evening dose of metformin.  She is checking her blood sugar daily in the morning before breakfast but forgot her meter today. States that blood sugars are running higher lately, mostly 180-210 with no readings <100.  Denies symptoms of hyper or hypoglycemia.   She stopped taking OCPs about 2 weeks ago as she felt they were giving her headaches.  Headaches were pressure sensation in forehead, completely resolved after she stopped taking OCPs.  However, she notes that she has not had a period since end of January, has had one episode of unprotected sexual intercourse.    Rash on her elbows and right ankle have resolved with moisturizer.    Patient was seen in ED on 02/12/13 for strep throat.  Completed antibiotic course, no longer having sore throat, fever.    Past Medical History  Diagnosis Date  . Diabetes mellitus   . Hypertension   . Fatty liver disease, nonalcoholic   . Depression   . Sinusitis    Current Outpatient Prescriptions  Medication Sig Dispense Refill  . amLODipine (NORVASC) 10 MG tablet Take 1 tablet (10 mg total) by mouth daily.  30 tablet  11  . aspirin 81 MG tablet Take 81 mg by mouth daily.      . Blood Glucose Monitoring Suppl (WAVESENSE PRESTO PRO METER) DEVI Use to check blood sugar up to 2 times daily       . glucose blood (WAVESENSE PRESTO TEST) test strip Use to test blood sugar up to 2 times daily       . insulin glargine (LANTUS)  100 UNIT/ML injection Inject 0.45 mLs (45 Units total) into the skin daily. To control blood sugars  10 mL  11  . Insulin Syringe-Needle U-100 (GNP INSULIN SYRINGE) 31G X 5/16" 0.5 ML MISC Use to inject insulin once daily       . Lancets MISC Use to check blood sugar as directed       . metFORMIN (GLUCOPHAGE) 1000 MG tablet Take 1 tablet (1,000 mg total) by mouth 2 (two) times daily with a meal.  180 tablet  3  . Prenatal Vit-Fe Fumarate-FA (PRENATAL 1 PLUS 1 PO) Take 1 tablet by mouth.        . Norgestimate-Ethinyl Estradiol Triphasic (ORTHO TRI-CYCLEN, 28,) 0.18/0.215/0.25 MG-35 MCG tablet Take 1 tablet by mouth daily.  1 Package  1   No current facility-administered medications for this visit.   Family History  Problem Relation Age of Onset  . Diabetes Mother   . Diabetes Father   . Cancer Paternal Grandmother    History   Social History  . Marital Status: Married    Spouse Name: N/A    Number of Children: N/A  . Years of Education: 7   Occupational History  .     Social History Main Topics  . Smoking status: Never Smoker   . Smokeless  tobacco: Never Used  . Alcohol Use: No  . Drug Use: No  . Sexual Activity: Yes    Birth Control/ Protection: None   Other Topics Concern  . None   Social History Narrative   [facility] per Rudell CobbDeborah Hill   12/16/2009   Recently gave birth to a son 04/2009.   Review of Systems: Review of Systems  Constitutional: Negative for fever.  HENT: Negative for sore throat.        Per HPI  Eyes: Negative for blurred vision.  Respiratory: Negative for cough and shortness of breath.   Cardiovascular: Negative for chest pain and leg swelling.  Gastrointestinal: Negative for nausea, vomiting, abdominal pain, diarrhea and constipation.  Genitourinary: Negative for dysuria.  Musculoskeletal: Negative for falls and myalgias.  Skin: Negative for rash.  Neurological: Positive for  headaches. Negative for dizziness, loss of consciousness and weakness.    Objective:  Physical Exam: Filed Vitals:   03/23/13 1552  BP: 133/88  Pulse: 89  Temp: 97 F (36.1 C)  TempSrc: Oral  Height: 4\' 11"  (1.499 m)  Weight: 155 lb 14.4 oz (70.716 kg)  SpO2: 99%   General: alert, cooperative, and in no apparent distress HEENT: NCAT, vision grossly intact, oropharynx clear and non-erythematous  Neck: supple, no lymphadenopathy Lungs: clear to ascultation bilaterally, normal work of respiration, no wheezes, rales, ronchi Heart: regular rate and rhythm, no murmurs, gallops, or rubs Abdomen: soft, non-tender, non-distended, normal bowel sounds Extremities: 2+ DP/PT pulses bilaterally, no cyanosis, clubbing, or edema Neurologic: alert & oriented X3, cranial nerves II-XII intact, strength grossly intact, sensation intact to light touch  Assessment & Plan:  Patient discussed with Dr. Rogelia BogaButcher. Please see problem-based assessment and plan.

## 2013-03-23 NOTE — Assessment & Plan Note (Signed)
Resolved with moisturizer.

## 2013-03-24 LAB — MICROALBUMIN / CREATININE URINE RATIO
CREATININE, URINE: 141.2 mg/dL
Microalb Creat Ratio: 15.2 mg/g (ref 0.0–30.0)
Microalb, Ur: 2.14 mg/dL — ABNORMAL HIGH (ref 0.00–1.89)

## 2013-03-24 NOTE — Progress Notes (Signed)
Case discussed with Dr. Rogers at the time of the visit.  We reviewed the resident's history and exam and pertinent patient test results.  I agree with the assessment, diagnosis, and plan of care documented in the resident's note. 

## 2013-03-31 ENCOUNTER — Telehealth: Payer: Self-pay | Admitting: Internal Medicine

## 2013-03-31 ENCOUNTER — Telehealth: Payer: Self-pay | Admitting: Dietician

## 2013-03-31 NOTE — Telephone Encounter (Signed)
Spoke to [name] about increasing her Lantus to 48 units daily given recent fasting blood sugars as documented in [name]'s note from this morning.  Patient understands and is amenable with plan.

## 2013-03-31 NOTE — Telephone Encounter (Signed)
Asked by dr. Aundria Rudogers to call patient to get fasting blood sugars from this past week. Patient reports they are "High" - 182/181/150/166/201 on  45 units a day of lantus

## 2013-04-17 ENCOUNTER — Telehealth: Payer: Self-pay | Admitting: Dietician

## 2013-04-17 DIAGNOSIS — E119 Type 2 diabetes mellitus without complications: Secondary | ICD-10-CM

## 2013-04-17 MED ORDER — INSULIN GLARGINE 100 UNIT/ML SOLOSTAR PEN
PEN_INJECTOR | SUBCUTANEOUS | Status: DC
Start: 2013-04-17 — End: 2013-05-19

## 2013-04-17 NOTE — Telephone Encounter (Signed)
Patient presents after being out of insulin for 2 days. She can not get lantus at the health department any more, but they are going to try to enroll her in another insulin assistance program, but this may take a while. Plan is to give her a voucher for a sample box of pens which should last her about 1 month to give her time to apply for the other insulin program. Patient was instructed on how to use the pen and was abel to demonstrate back good technique. She verbalizes understanding of need to reapply to Medication Assistance for another insulin.  Request printed rx for 1 box of pens for her to redeem along with the voucher.

## 2013-04-20 ENCOUNTER — Telehealth: Payer: Self-pay | Admitting: Dietician

## 2013-04-20 NOTE — Telephone Encounter (Signed)
Patient presents to offoce saying that [facility] and they kept her voucher nad prescription for lantus.  Called [facility]: they said they would try to run it through if she had the voucher and they did not know where it or the rx were. Patient says she will go back to them to try to get the voucher and the prescription and take them to another drug store.

## 2013-05-04 NOTE — Telephone Encounter (Signed)
Spouse left message that all papers were found and they were able to get her insulin at no charge

## 2013-05-19 ENCOUNTER — Ambulatory Visit (INDEPENDENT_AMBULATORY_CARE_PROVIDER_SITE_OTHER): Payer: No Typology Code available for payment source | Admitting: Internal Medicine

## 2013-05-19 ENCOUNTER — Encounter: Payer: Self-pay | Admitting: Internal Medicine

## 2013-05-19 VITALS — BP 115/77 | HR 83 | Temp 97.2°F | Ht 59.0 in | Wt 152.8 lb

## 2013-05-19 DIAGNOSIS — M79644 Pain in right finger(s): Secondary | ICD-10-CM

## 2013-05-19 DIAGNOSIS — I10 Essential (primary) hypertension: Secondary | ICD-10-CM

## 2013-05-19 DIAGNOSIS — IMO0001 Reserved for inherently not codable concepts without codable children: Secondary | ICD-10-CM

## 2013-05-19 DIAGNOSIS — M79609 Pain in unspecified limb: Secondary | ICD-10-CM

## 2013-05-19 DIAGNOSIS — E119 Type 2 diabetes mellitus without complications: Secondary | ICD-10-CM

## 2013-05-19 DIAGNOSIS — E1165 Type 2 diabetes mellitus with hyperglycemia: Secondary | ICD-10-CM

## 2013-05-19 LAB — GLUCOSE, CAPILLARY: Glucose-Capillary: 210 mg/dL — ABNORMAL HIGH (ref 70–99)

## 2013-05-19 LAB — POCT GLYCOSYLATED HEMOGLOBIN (HGB A1C): Hemoglobin A1C: 9.1

## 2013-05-19 MED ORDER — INSULIN GLARGINE 100 UNIT/ML SOLOSTAR PEN: PEN_INJECTOR | SUBCUTANEOUS | Status: DC

## 2013-05-19 MED ORDER — DICLOFENAC SODIUM 1 % TD GEL: 2.0000 g | Freq: Four times a day (QID) | TRANSDERMAL | Status: DC

## 2013-05-19 NOTE — Patient Instructions (Signed)
[facility], Robin LeashDonna Plyler ASAP.   Keep up the good work taking your medicines as prescribed.  Thank you for bringing your medications and glucometer in today, please remember to so next time.   We are giving you an anti-inflammatory gel you can apply to your right third finger for pain.  Apply 2-3 times daily for the next 3 days.  Please let us know if the pain does not improve or if the finger becomes red and very swollen or if you develop a fever.   We are also giving you a printed prescription for Lantus today so you can continue getting it through [facility].   Gua de planeamiento de la alimentacin para diabticos (Diabetes Meal Planning Guide) La gua de planeamiento de alimentacin para diabticos es una herramienta para ayudarlo a planear sus comidas y colaciones. Es importante para las personas con diabetes controlar sus niveles de International aid/development workerazcar. Elegir los Altria Groupalimentos correctos y las cantidades adecuadas durante el da le ayudar a Technical brewercontrolar el azcar en sangre. Comer bien puede incluso ayudarlo a mejorar la presin sangunea y Baristaalcanzar o Pharmacologistmantener un peso saludable. CUENTE LOS HIDRATOS DE CARBONO CON FACILIDAD Cuando consume hidratos de carbono, stos se transforman en azcar (glucosa). Esto a su vez Counselloraumenta el nivel de Production assistant, radioazcar en sangre. El conteo de carbohidratos puede ayudarlo a Chief Operating Officercontrolar este nivel para que se sienta mejor. Al planear sus alimentos con el conteo de carbohidratos, podr tener ms flexibilidad en lo que come y Physiological scientistequilibrar la medicacin con el consumo de alimentos. El conteo de carbohidratos significa simplemente sumar la cantidad total de gramos de carbohidratos a sus comidas o colaciones. Trate de consumir la misma cantidad en cada comida. A continuacin encontrar una lista de 1 porcin o 15 gr. de carbohidratos. A continuacin se enumeran. Pregunte al mdico cuntos gramos de carbohidratos necesita comer en cada comida o colacin. Almidones y granos  1  Zimbabwerebanada de pan.   bollo ingls o bollo para hamburguesa o hotdog.   taza de cereal fro (sin azcar).   taza de pasta o arroz cocido.   taza de vegetales que contengan almidn (maz, papas, arvejas, porotos, calabaza).  1 omelette (6 pulgadas).   bollo.  1 waffle o panqueque (del tamao de un CD).   taza de cereales cocidos.  4 a 6 galletas saldas pequeas. *Se recomienda el consumo de granos enteros. Frutas  1 taza de frutos rojos, meln, papaya o anan sin azcar.  1 fruta fresca pequea.   banana o mango.   taza de jugo de frutas (4 onzas sin endulzar).   taza de fruta envasada en jugo natural o agua.  2 cucharadas de frutas secas.  12-15 uvas o cerezas. Leche y yogurt  1 taza de PPG Industriesleche descremada o al 1%.  1 taza de leche de soja.  6 onzas de yogurt descremado con edulcorante sin azcar.  6 onzas de yogur descremado de soja.  6 onzas de yogur natural. Vegetales  1 taza de vegetales crudos o  de vegetales cocidos se considera cero carbohidratos o una comida "libre".  Si come 3 o ms porciones en una comida, cuntelas como 1 porcin de carbohidratos. Otros carbohidratos   onzas de chips o pretzels.   taza de helado de crema o yogur helado.   taza de helado de agua.  5 cm de torta no congelada.  1 cucharada de miel, azcar, mermelada, jalea o almbar.  2 galletitas dulces pequeas.  3 cuadrados de crackers de  graham.  3 tazas de palomitas de maz.  6 crackers.  1 taza de caldo.  Cuente 1 taza de guisado u otra mezcla de alimentos como 2 porciones de carbohidratos.  Los alimentos con menos de 20 caloras por porcin deben contarse como cero carbohidratos o alimento "libre". Si lo desea compre un libro o software de computacin que enumere la cantidad de gramos de carbohidratos de los diferentes alimentos. Adems, el panel nutricional en las etiquetas de los productos que consume es una buena fuente de informacin. Le indicar el  tamao de la porcin y la cantidad total de carbohidratos que consumir por cada una. Divida este nmero por 15 para obtener el nmero de conteo de carbohidratos por porcin. Recuerde: cada porcin son 25 gramos de carbohidratos. PORCIONES La medicin de los alimentos y el tamao de las porciones lo ayudarn a Scientist, physiological cantidad exacta de comida que debe ingerir. La lista que sigue le mostrar el tamao de algunas porciones comunes.   1 onza.................4 dados apilados.  3 onzas..............Marland KitchenUn mazo de cartas.  1 cucharadita...Marland KitchenMarland KitchenLa punta de un dedo pequeo.  1 cucharada.......Marland KitchenUn dedo.  2 cucharadas....Marland KitchenMarland KitchenUna pelota de golf.   taza..............Marland KitchenLa mitad de un puo.  1 taza...............Marland KitchenUn puo. EJEMPLO DE PLAN DE ALIMENTACIN PARA DIABTICOS: A continuacin se muestra un ejemplo de plan de alimentacin que incluye comidas de los grupos de granos y [place], Sports administrator, frutas y carnes. Un nutricionista podr confeccionarle un plan individualizado para cubrir sus necesidades calricas y decirle el nmero de porciones que necesita de [place]. Sin embargo, podra [employer] alimentos que contengan carbohidratos (lcteos, cereales y frutas). Controlar la cantidad total de carbohidratos en los alimentos o colaciones es ms importante que asegurarse de incluir todos los grupos alimenticios cada vez que come.  El siguiente plan de alimentacin es un ejemplo de una dieta de 2000 caloras mediante el conteo de carbohidratos. Este plan contiene 17 porciones de carbohidratos. Desayuno  1 taza de avena (2 porciones de carbohidratos).   taza de yogur light(1 porcin de carbohidratos).  1 taza de arndanos (1 porcin de carbohidratos).   taza de almendras. Colacin  1 manzana grande (2 porciones de carbohidratos).  1 palito de queso bajo [employer]. Almuerzo  Ensalada de pechuga de pollo.  1 taza de espinacas.   taza de tomates cortados.  2 oz (60 gr) de pechuga de  pollo en rebanadas.  2 cucharadas de aderezo italiano bajo en [employer].  12 galletas integrales (2 porciones de carbohidratos).  12 a 15 uvas (1 porcin de carbohidratos).  1 taza de [employer] (1porcin de carbohidratos). Colacin  1 taza de zanahorias.   taza de pur de garbanzos (1 porcin de carbohidratos). Cena  3 oz (80 gr) de salmn a la parrilla.  1 taza de arroz integral (3 porciones de carbohidratos). Colacin  1  taza de brcoli al vapor (1 porcin de carbohidrato) con una cucharadita de aceite de oliva y jugo de limn.  1 taza de budn light (2 porciones de carbohidratos). HOJA DE PLANEAMIENTO DE LA ALIMENTACIN: El dietista podr utilizar esta hoja para ayudarlo a decidir cuntas porciones y qu tipos de alimentos son los adecuados para usted.  DESAYUNO Grupo de alimentos y porciones / Alimento elegido Granos/Fculas_________________________________________________ Lcteos________________________________________________________ Rufina Falco ______________________________________________________ Lou Miner __________________________________________________________ Charlesetta Ivory _________________________________________________________ Rosalin Hawking _________________________________________________________ Lorin Mercy de alimentos y porciones / Alimento elegido Granos/Fculas___________________________________________________ Lcteos_________________________________________________________ Lou Miner ___________________________________________________________ Charlesetta Ivory __________________________________________________________ Rosalin Hawking __________________________________________________________ [place] de alimentos y porciones / Alimento elegido Granos/Fculas___________________________________________________ Lcteos_________________________________________________________ Lou Miner ___________________________________________________________ Carnes  __________________________________________________________ Rosalin Hawking __________________________________________________________  COLACIN Grupo de alimentos y porciones / Alimento elegido Granos/Fculas_________________________________________________ Lcteos________________________________________________________ Rufina FalcoVegetales ______________________________________________________ Lou MinerFruta _________________________________________________________ Charlesetta Ivoryarnes ________________________________________________________ Rosalin HawkingGrasas ________________________________________________________ Carolyn StareTALES DIARIOS Fculas_______________________________________________________ Vegetales _____________________________________________________ Lou MinerFruta ________________________________________________________ Lcteos_______________________________________________________ Carnes________________________________________________________ Rosalin HawkingGrasas ________________________________________________________ Document Released: 03/31/2007 Document Revised: 03/16/2011 ExitCare Patient Information 2014 [place], [place].

## 2013-05-19 NOTE — Progress Notes (Signed)
Patient ID: Robin Klein, female   DOB: 01/27/1970, 43 y.o.   MRN: [id number]   Subjective:   Patient ID: Robin Klein female   DOB: 02/26/1970 43 y.o.   MRN: [id number]  HPI: Robin Klein is a 43 y.o. woman with history of DM2, HTN, HL who presents for routine follow-up.   Patient states she is doing quite well overall.  She is complaining of some right third finger pain with associated mild swelling x 2-3 weeks.  She thinks she "strained something" but is able to move the finger and has not noticed any redness, warmth, or drainage.  She has not been taking anything for pain because it is not severe, and she does not like to take oral pain medicines.    In terms of her diabetes, A1C is 9.1% today (8.9% in 01/2013).  She reports generally good medication compliance though she recently missed 3-4 doses of insulin when she was having difficulty getting it from the pharmacy.  Robin Leash(Klein worked with her, and she is now able to get Lantus free of charge through MAP program, requesting printed rx today.)  She is checking her blood glucose in AM, states that it has been running in 140s-170s, once in 280s when she did not have insulin.  She did bring her glucometer today which showed average BG 216 with average pre-breakfast BG 204, average post-breakfast BG 222.  Denies symptoms of hyper/hypoglycemia.   She does report recent dietary indiscretions as she has been eating more fried food and cake.  She is willing to meet with Robin LeashDonna again soon to discuss diet and exercise.    BP 115/77 today.  She reports good medication compliance with amlodipine 10 mg daily.  Denies headaches, nausea, dizziness.   She states she saw an ophthalmologist in 01/2013 but is willing to have retinal scan today.    Past Medical History  Diagnosis Date  . Diabetes mellitus   . Hypertension   . Fatty liver disease, nonalcoholic   . Depression   . Sinusitis    Current Outpatient Prescriptions    Medication Sig Dispense Refill  . amLODipine (NORVASC) 10 MG tablet Take 1 tablet (10 mg total) by mouth daily.  30 tablet  11  . aspirin 81 MG tablet Take 81 mg by mouth daily.      . Blood Glucose Monitoring Suppl (WAVESENSE PRESTO PRO METER) DEVI Use to check blood sugar up to 2 times daily       . glucose blood (WAVESENSE PRESTO TEST) test strip Use to test blood sugar up to 2 times daily       . Insulin Glargine (LANTUS SOLOSTAR) 100 UNIT/ML Solostar Pen Inject 48 units under the skin as instructed  15 mL  0  . Insulin Syringe-Needle U-100 (GNP INSULIN SYRINGE) 31G X 5/16" 0.5 ML MISC Use to inject insulin once daily       . Lancets MISC Use to check blood sugar as directed       . metFORMIN (GLUCOPHAGE) 1000 MG tablet Take 1 tablet (1,000 mg total) by mouth 2 (two) times daily with a meal.  180 tablet  3  . Prenatal Vit-Fe Fumarate-FA (PRENATAL 1 PLUS 1 PO) Take 1 tablet by mouth.        . diclofenac sodium (VOLTAREN) 1 % GEL Apply 2 g topically 4 (four) times daily.  1 Tube  0   No current facility-administered medications for this visit.   Family History  Problem Relation Age of Onset  . Diabetes Mother   . Diabetes Father   . Cancer Paternal Grandmother    History   Social History  . Marital Status: Married    Spouse Name: N/A    Number of Children: N/A  . Years of Education: 7   Occupational History  .     Social History Main Topics  . Smoking status: Never Smoker   . Smokeless tobacco: Never Used  . Alcohol Use: No  . Drug Use: No  . Sexual Activity: Yes    Birth Control/ Protection: None   Other Topics Concern  . None   Social History Narrative   [facility] Robin CobbDeborah [facility]   12/16/2009   Recently gave birth to a son 04/2009.   Review of Systems: Review of Systems  Constitutional: Negative for fever.  Eyes: Negative for blurred vision.  Respiratory: Negative for shortness of breath.    Cardiovascular: Negative for chest pain and leg swelling.  Gastrointestinal: Negative for nausea, vomiting, abdominal pain, diarrhea and constipation.  Genitourinary: Negative for dysuria.  Musculoskeletal: Negative for falls.  Neurological: Negative for dizziness, loss of consciousness, weakness and headaches.    Objective:  Physical Exam: Filed Vitals:   05/19/13 1515  BP: 115/77  Pulse: 83  Temp: 97.2 F (36.2 C)  TempSrc: Oral  Height: 4\' 11"  (1.499 m)  Weight: 152 lb 12.8 oz (69.31 kg)  SpO2: 99%   General: alert, cooperative, and in no apparent distress HEENT: NCAT, vision grossly intact, oropharynx clear and non-erythematous  Neck: supple, no lymphadenopathy Lungs: clear to ascultation bilaterally, normal work of respiration, no wheezes, rales, ronchi Heart: regular rate and rhythm, no murmurs, gallops, or rubs Abdomen: soft, non-tender, non-distended, normal bowel sounds Extremities: right third finger good ROM and no swelling, erythema; no cyanosis, clubbing, or edema Neurologic: alert & oriented X3, cranial nerves II-XII intact, strength grossly intact, sensation intact to light touch  Assessment & Plan:  Patient discussed with Dr. Cyndie ChimeGranfortuna.  Please see problem-based assessment and plan.

## 2013-05-19 NOTE — Assessment & Plan Note (Signed)
BP 115/77 today, controlled.  Continue amlodipine 10 mg daily.  Follow-up in 3 months.

## 2013-05-19 NOTE — Assessment & Plan Note (Signed)
Lab Results  Component Value Date   HGBA1C 9.1 05/19/2013   HGBA1C 8.9 01/16/2013   HGBA1C 9.0 11/03/2012     Assessment: Diabetes control:  uncontrolled Progress toward A1C goal:   unchanged  Plan: Medications:  continue current medications- Lantus 48 units qhs, metformin 1000 mg BID Home glucose monitoring: Frequency:  twice daily Timing:  before breakfast, at bedtime Instruction/counseling given: reminded to get eye exam as well as bring medications and glucometer to next visit Educational resources provided: handout Self management tools provided: copy of home glucose meter download Other plans: Retinal scan today.  Patient to meet with Lupita Leashonna Plyler ASAP to discuss diet and exercise.  Follow-up in 3 months. Consider adding additional oral agent at that time if A1C continues to rise.

## 2013-05-19 NOTE — Progress Notes (Signed)
Interpreter Robin Klein for Dr Aundria Rudogers

## 2013-05-19 NOTE — Assessment & Plan Note (Signed)
See HPI.  Gave rx for Voltaren gel, instructed patient to use 2-3 times daily for next 3-4 days.  She will call if pain does not improve or if she develops redness, worsening swelling, and/or fever.

## 2013-05-31 ENCOUNTER — Emergency Department (INDEPENDENT_AMBULATORY_CARE_PROVIDER_SITE_OTHER)
Admission: EM | Admit: 2013-05-31 | Discharge: 2013-05-31 | Disposition: A | Discharge status: Home / Self Care | Payer: Self-pay | Source: Home / Self Care | Attending: Family Medicine | Admitting: Family Medicine

## 2013-05-31 ENCOUNTER — Encounter (HOSPITAL_COMMUNITY): Payer: Self-pay | Admitting: Emergency Medicine

## 2013-05-31 DIAGNOSIS — J069 Acute upper respiratory infection, unspecified: Secondary | ICD-10-CM

## 2013-05-31 MED ORDER — BENZONATATE 100 MG PO CAPS: 100.0000 mg | ORAL_CAPSULE | Freq: Three times a day (TID) | ORAL | Status: DC

## 2013-05-31 NOTE — ED Notes (Signed)
Patient of internal medicine/outpatient clinic

## 2013-05-31 NOTE — ED Notes (Signed)
Patient and child are being seen in the same room

## 2013-05-31 NOTE — ED Notes (Signed)
C/o sore throat, headache, runny nose, ears hurting for 4 days

## 2013-05-31 NOTE — ED Provider Notes (Signed)
CSN: [id number]     Arrival date & time 05/31/13  0827 History   First MD Initiated Contact with Patient 05/31/13 [phone]     Chief Complaint  Patient presents with  . Otalgia  . URI   (Consider location/radiation/quality/duration/timing/severity/associated sxs/prior Treatment) Patient is a 43 y.o. female presenting with ear pain and URI. The history is provided by the patient. No language interpreter was used.  Otalgia Location:  Bilateral Behind ear:  No abnormality Quality:  Aching Onset quality:  Gradual Timing:  Constant Progression:  Worsening Chronicity:  New Relieved by:  Nothing Worsened by:  Nothing tried Ineffective treatments:  None tried Associated symptoms: rhinorrhea   Associated symptoms: no fever   URI Presenting symptoms: ear pain and rhinorrhea   Presenting symptoms: no fever     Past Medical History  Diagnosis Date  . Diabetes mellitus   . Hypertension   . Fatty liver disease, nonalcoholic   . Depression   . Sinusitis    Past Surgical History  Procedure Laterality Date  . Cesarean section     Family History  Problem Relation Age of Onset  . Diabetes Mother   . Diabetes Father   . Cancer Paternal Grandmother    History  Substance Use Topics  . Smoking status: Never Smoker   . Smokeless tobacco: Never Used  . Alcohol Use: No   OB History   Grav Para Term Preterm Abortions TAB SAB Ect Mult Living   4 3 3  0 1  1 0 0 3     Review of Systems  Constitutional: Negative for fever.  HENT: Positive for ear pain and rhinorrhea.   All other systems reviewed and are negative.   Allergies  Hctz  Home Medications   Prior to Admission medications   Medication Sig Start Date End Date Taking? Authorizing Provider  amLODipine (NORVASC) 10 MG tablet Take 1 tablet (10 mg total) by mouth daily. 01/16/13 01/16/14  Belia HemanNeema K Sharda, MD  aspirin 81 MG tablet Take 81 mg by mouth daily.    Historical Provider, MD  Blood Glucose Monitoring Suppl (WAVESENSE  PRESTO PRO METER) DEVI Use to check blood sugar up to 2 times daily     Historical Provider, MD  diclofenac sodium (VOLTAREN) 1 % GEL Apply 2 g topically 4 (four) times daily. 05/19/13   Rocco SereneMorgan Rogers, MD  glucose blood (WAVESENSE PRESTO TEST) test strip Use to test blood sugar up to 2 times daily     Historical Provider, MD  Insulin Glargine (LANTUS SOLOSTAR) 100 UNIT/ML Solostar Pen Inject 48 units under the skin as instructed 05/19/13   Rocco SereneMorgan Rogers, MD  Insulin Syringe-Needle U-100 (GNP INSULIN SYRINGE) 31G X 5/16" 0.5 ML MISC Use to inject insulin once daily     Historical Provider, MD  Lancets MISC Use to check blood sugar as directed     Historical Provider, MD  metFORMIN (GLUCOPHAGE) 1000 MG tablet Take 1 tablet (1,000 mg total) by mouth 2 (two) times daily with a meal. 01/16/13   Neema Davina PokeK Sharda, MD  Prenatal Vit-Fe Fumarate-FA (PRENATAL 1 PLUS 1 PO) Take 1 tablet by mouth.      Historical Provider, MD   BP 116/82  Pulse 87  Temp(Src) 97.3 F (36.3 C) (Oral)  Resp 16  SpO2 97% Physical Exam  Nursing note and vitals reviewed. Constitutional: She appears well-developed and well-nourished.  HENT:  Head: Normocephalic.  Right Ear: External ear normal.  Left Ear: External ear normal.  Mouth/Throat: Oropharynx is  clear and moist.  Eyes: Conjunctivae and EOM are normal. Pupils are equal, round, and reactive to light.  Neck: Normal range of motion. Neck supple.  Abdominal: Soft.  Neurological: She is alert.  Skin: Skin is warm.    ED Course  Procedures (including critical care time) Labs Review Labs Reviewed - No data to display  Imaging Review No results found.   MDM   1. URI (upper respiratory infection)    Probable viral illness,  Rx for tessalon    Elson Areas, PA-C 05/31/13 1204

## 2013-05-31 NOTE — Discharge Instructions (Signed)
Tos - Adultos  (Cough, Adult)  La tos es un reflejo que ayuda a limpiar las vas areas y Administrator. Puede ayudar a curar el organismo o ser Neomia Dear reaccin a un irritante. La tos puede durar [employer] 2  3 semanas (aguda) o puede durar ms de 8 semanas (crnica)  CAUSAS  Tos aguda:   Infecciones virales o bacterianas. Tos crnica.   Infecciones.  Alergias.  Asma.  Goteo post nasal.  El hbito de fumar.  Acidez o reflujo gstrico.  Algunos medicamentos.  Problemas pulmonares crnicos  Cncer. SNTOMAS   Tos.  Grant Ruts.  Dolor en el pecho.  Aumento en el ritmo respiratorio.  Ruidos agudos al respirar (sibilancias).  Moco coloreado al toser (esputo). TRATAMIENTO   Un tos de causa bacteriana puede tratarse con antibiticos.  La tos de origen viral debe seguir su curso y no responde a los antibiticos.  El mdico podr recomendar otros tratamientos si tiene tos crnica. INSTRUCCIONES PARA EL CUIDADO EN EL HOGAR   Solo tome medicamentos que se pueden comprar sin receta o recetados para Chief Technology Officer, Dentist o fiebre, como le indica el mdico. Utilice antitusivos slo en la forma indicada por el mdico.  Use un vaporizador o humidificador de niebla fra en la habitacin para ayudar a aflojar las secreciones.  Duerma en posicin semi erguida si la tos empeora por la noche.  Descanse todo lo que pueda.  Si fuma, abandone el hbito. SOLICITE ATENCIN MDICA DE INMEDIATO SI:   Observa pus en el esputo.  La tos empeora.  No puede controlar la tos con antitusivos y no puede dormir debido a Secretary/administrator.  Comienza a escupir sangre al toser.  Tiene dificultad para respirar.  El dolor empeora o no puede controlarlo con los medicamentos.  Tiene fiebre. ASEGRESE DE QUE:   Comprende estas instrucciones.  Controlar su enfermedad.  Solicitar ayuda de inmediato si no mejora o si empeora. Document Released: 07/30/2010 Document Revised: 03/16/2011 [facility] Patient  Information 2014 [place], [place]. Infeccin de las vas areas superiores en los adultos (Upper Respiratory Infection, Adult)  La infeccin de las vas areas superiores tambin se conoce como resfro comn. La causa es un tipo de germen (virus). Los resfros se diseminan fcilmente (son contagiosos). Puede transmitirlo a los dems al besar, toser, estornudar o beber en el mismo vaso. Generalmente se cura en 1 a 2 semanas.  CUIDADOS EN EL HOGAR   Tome la medicacin segn las indicaciones.  Use un humidificador caliente o respire el vapor en una ducha caliente.  Beba gran cantidad de lquido para mantener la orina de tono claro o color amarillo plido.  Debe hacer reposo.  Regrese a su trabajo cuando la temperatura se haya normalizado, o cuando el mdico se lo indique. Puede usar un barbijo y Customer service manager las manos para evitar que el resfro se contagie. SOLICITE AYUDA DE INMEDIATO SI:   Luego de los primeros das siente que empeora en vez de [place].  Tiene dudas relacionadas con los medicamentos.  Siente escalofros, le falta el aire o escupe moco de color marrn o rojo.  Tiene una secrecin nasal de color amarillo o marrn, o siente dolor en el rostro, especialmente cuando se inclina hacia adelante.  Tiene fiebre, siente el cuello hinchado, tiene dolor al tragar u observa manchas blancas en el fondo de la garganta.  Comienza a sentir Herbalist de cabeza intenso o persistente, dolor de odos, en el seno nasal o en el pecho.  Al respirar emite un sonido similar  a un silbido (sibilancias).  Siente falta de aire o escupe sangre al toser.  Tiene dolores musculares o rigidez en el cuello. ASEGRESE DE QUE:   Comprende estas instrucciones.  Controlar su enfermedad.  Solicitar ayuda de inmediato si no mejora o si empeora. Document Released: 05/26/2010 Document Revised: 03/16/2011 [facility] Patient Information 2014 [place].

## 2013-06-01 NOTE — ED Provider Notes (Signed)
Medical screening examination/treatment/procedure(s) were performed by a resident physician or non-physician practitioner and as the supervising physician I was immediately available for consultation/collaboration.  Rufus Beske, MD    Laylia Mui S Anush Wiedeman, MD 06/01/13 0754 

## 2013-06-09 ENCOUNTER — Other Ambulatory Visit: Payer: Self-pay | Admitting: Internal Medicine

## 2013-06-09 DIAGNOSIS — E119 Type 2 diabetes mellitus without complications: Secondary | ICD-10-CM

## 2013-06-09 MED ORDER — INSULIN GLARGINE 100 UNIT/ML SOLOSTAR PEN: PEN_INJECTOR | SUBCUTANEOUS | Status: DC

## 2013-06-09 MED ORDER — ROSUVASTATIN CALCIUM 5 MG PO TABS: 5.0000 mg | ORAL_TABLET | Freq: Every day | ORAL | Status: DC

## 2013-06-30 ENCOUNTER — Other Ambulatory Visit: Payer: Self-pay | Admitting: *Deleted

## 2013-06-30 DIAGNOSIS — I10 Essential (primary) hypertension: Secondary | ICD-10-CM

## 2013-06-30 MED ORDER — AMLODIPINE BESYLATE 10 MG PO TABS: 10.0000 mg | ORAL_TABLET | Freq: Every day | ORAL | Status: DC

## 2013-06-30 NOTE — Telephone Encounter (Signed)
Pt has changed pharmacy.

## 2013-07-20 ENCOUNTER — Ambulatory Visit: Payer: No Typology Code available for payment source

## 2013-08-24 ENCOUNTER — Other Ambulatory Visit: Payer: Self-pay | Admitting: *Deleted

## 2013-08-24 DIAGNOSIS — E119 Type 2 diabetes mellitus without complications: Secondary | ICD-10-CM

## 2013-08-24 MED ORDER — INSULIN GLARGINE 100 UNIT/ML SOLOSTAR PEN: PEN_INJECTOR | SUBCUTANEOUS | Status: DC

## 2013-09-05 ENCOUNTER — Ambulatory Visit (INDEPENDENT_AMBULATORY_CARE_PROVIDER_SITE_OTHER): Payer: No Typology Code available for payment source | Admitting: Internal Medicine

## 2013-09-05 ENCOUNTER — Encounter: Payer: Self-pay | Admitting: Internal Medicine

## 2013-09-05 ENCOUNTER — Encounter: Payer: Self-pay | Admitting: Dietician

## 2013-09-05 VITALS — BP 129/83 | HR 86 | Temp 97.7°F | Ht 60.0 in | Wt 155.9 lb

## 2013-09-05 DIAGNOSIS — M778 Other enthesopathies, not elsewhere classified: Secondary | ICD-10-CM | POA: Insufficient documentation

## 2013-09-05 DIAGNOSIS — M65839 Other synovitis and tenosynovitis, unspecified forearm: Secondary | ICD-10-CM

## 2013-09-05 DIAGNOSIS — E119 Type 2 diabetes mellitus without complications: Secondary | ICD-10-CM

## 2013-09-05 DIAGNOSIS — M65849 Other synovitis and tenosynovitis, unspecified hand: Secondary | ICD-10-CM

## 2013-09-05 DIAGNOSIS — E785 Hyperlipidemia, unspecified: Secondary | ICD-10-CM

## 2013-09-05 DIAGNOSIS — I10 Essential (primary) hypertension: Secondary | ICD-10-CM

## 2013-09-05 LAB — LIPID PANEL
CHOL/HDL RATIO: 3.3 ratio
CHOLESTEROL: 148 mg/dL (ref 0–200)
HDL: 45 mg/dL (ref >39)
LDL Cholesterol: 72 mg/dL (ref 0–99)
Triglycerides: 157 mg/dL — ABNORMAL HIGH (ref <150)
VLDL: 31 mg/dL (ref 0–40)

## 2013-09-05 LAB — HM DIABETES EYE EXAM

## 2013-09-05 LAB — POCT GLYCOSYLATED HEMOGLOBIN (HGB A1C): HEMOGLOBIN A1C: 9.9

## 2013-09-05 LAB — GLUCOSE, CAPILLARY: GLUCOSE-CAPILLARY: 191 mg/dL — AB (ref 70–99)

## 2013-09-05 MED ORDER — INSULIN GLARGINE 100 UNIT/ML SOLOSTAR PEN: PEN_INJECTOR | SUBCUTANEOUS | Status: DC

## 2013-09-05 MED ORDER — INSULIN ASPART 100 UNIT/ML ~~LOC~~ SOLN: 4.0000 [IU] | Freq: Two times a day (BID) | SUBCUTANEOUS | Status: DC

## 2013-09-05 NOTE — Assessment & Plan Note (Signed)
Lab Results  Component Value Date   HGBA1C 9.9 09/05/2013   HGBA1C 9.1 05/19/2013   HGBA1C 8.9 01/16/2013     Assessment: Diabetes control: poor control (HgbA1C >9%) Progress toward A1C goal:  deteriorated Foot exam: No ulcer or wounds. Normal sensation bilaterally. Comments: [facility]-2 [facility]. She was able to obtain it and take it for about a week before her visit. Her blood sugars were in the 300s during that time. Normally, her blood glucose is 170-250 in the morning. Each visit, patient needs her Lantus and new Novolog prescriptions to be printed out so that she can take it with her to the health department and qualify for [facility].   Plan: Medications:  Continue Metformin 1000 mg BID, Decrease Lantus to 35 units QHS, Add Novolog 4 units before lunch and before dinner.  Home glucose monitoring: Frequency: 3 times a day Timing: before breakfast;before lunch;before dinner Instruction/counseling given: reminded to bring blood glucose meter & log to each visit Educational resources provided: brochure Self management tools provided: copy of home glucose meter download;home glucose logbook Other plans: patient was instructed to check her glucose before and after lunch and dinner just for the 4 days prior to her next visit so we can better assess glucose control with the new addition of Novolog.

## 2013-09-05 NOTE — Patient Instructions (Addendum)
General Instructions:   Thank you for bringing your medicines today. This helps [place] keep you safe from mistakes.   Progress Toward Treatment Goals:  Treatment Goal 09/05/2013  Hemoglobin A1C deteriorated  Blood pressure at goal    Self Care Goals & Plans:  Self Care Goal 09/05/2013  Manage my medications take my medicines as prescribed; bring my medications to every visit; refill my medications on time  Monitor my health keep track of my blood glucose; bring my glucose meter and log to each visit  Eat healthy foods drink diet soda or water instead of juice or soda; eat more vegetables; eat foods that are low in salt; eat baked foods instead of fried foods; eat fruit for snacks and desserts  Be physically active -    Home Blood Glucose Monitoring 09/05/2013  Check my blood sugar 3 times a day  When to check my blood sugar before breakfast; before lunch; before dinner     Care Management & Community Referrals:  Referral 09/05/2013  Referrals made for care management support diabetes educator       We added Novolog Rapid Acting Insulin to your regimen. Please take Novolog 4 units 15 minutes before lunch and 15 minutes before dinner. Continue taking your Metformin 1000 mg twice a day. We decreased your Lantus dose to 35 units once at nighttime. Please continue to check your blood sugar when you wake up in the morning and please start checking your blood sugar before lunch and before dinner before you give yourself the Novolog.  Please check your blood sugar before lunch and after lunch and before dinner and after dinner 3-4 days BEFORE you return for your next appointment. This will help [place] better understand how the insulin is working.  If you feel dizzy, faint, lightheaded, confused or sweaty, please check your blood sugar. If your glucose is less than 60, please drink juice or eat food to bring your sugar up. Please contact the clinic so we can adjust your medication. IF YOU DO NOT EAT  LUNCH OR DINNER, DO NOT USE YOUR NOVOLOG.

## 2013-09-05 NOTE — Assessment & Plan Note (Signed)
Last lipid profile done on 11/03/12 which showed Total Cholesterol 173, TG 205, HDL 40, and LDL 92. Statin therapy was decided against at that time since LDL <100. We rechecked her lipid profile during her visit on 09/05/13 and will consider starting statin therapy based on her results.   Lipid Panel     Component Value Date/Time   CHOL 173 11/03/2012 1653   TRIG 205* 11/03/2012 1653   HDL 40 11/03/2012 1653   CHOLHDL 4.3 11/03/2012 1653   VLDL 41* 11/03/2012 1653   LDLCALC 92 11/03/2012 1653

## 2013-09-05 NOTE — Assessment & Plan Note (Signed)
BP Readings from Last 3 Encounters:  09/05/13 129/83  05/31/13 116/82  05/19/13 115/77    Lab Results  Component Value Date   NA 136 11/03/2012   K 4.0 11/03/2012   CREATININE 0.63 11/03/2012    Assessment: Blood pressure control: controlled Progress toward BP goal:  at goal Comments: Patient has been taking her medication as prescribed and without problems. She was previously on HCTZ/ACEI, but discontinued due to allergy to HCTZ and possible angioedema due to ACEI.  Plan: Medications:  continue current medications Educational resources provided:   Self management tools provided:

## 2013-09-05 NOTE — Progress Notes (Signed)
Subjective:     Patient ID: Robin Klein, female   DOB: 11-Aug-1970, 43 y.o.   MRN: [id number]  HPI   [name] is a 43 yo female who presents for routine follow up for diabetes. She is accompanied by a Nurse, learning disability. She states she checks her blood glucose once in the morning and it typically runs between 170 and 250. She states she was out of her Lantus for a week about 1.5 weeks ago due to prescription issues. She is able to get her medication for free to low cost through [facility]. She states she needs a print out of the prescription each times she goes. She denies any hypoglycemic events. She has been trying to eat healthier and exercise. She denies any vision changes or tingling, burning sensations in her feet or legs.  Patient complain of pain in her right medial wrist which started one week ago. She states she was pushing her kids in a stroller which was too heavy for her. The pain started after their walk and has been intermittent. She has tried ibuprofen for the pain, which helps control it. Pain is a 7/10 at its worst. It feels better when she presses on it and hurts worst with lateral motion of the wrist. She states she remains very active taking care of her kids and has not been able to rest it properly.   Review of Systems General: Denies fever, chills, fatigue, change in appetite and diaphoresis.  Respiratory: Denies SOB, cough, DOE, chest tightness, and wheezing.   Cardiovascular: Denies chest pain and palpitations.  Gastrointestinal: Denies nausea, vomiting, abdominal pain, diarrhea, constipation, blood in stool and abdominal distention.  Genitourinary: Denies dysuria, urgency, frequency, hematuria, suprapubic pain and flank pain. Endocrine: Denies hot or cold intolerance, polyuria, and polydipsia. Musculoskeletal: Admits to right wrist pain. Denies back pain, joint swelling, and gait problem.  Skin: Denies pallor, rash and wounds.   Neurological: Denies dizziness, headaches, weakness, lightheadedness, numbness,seizures, and syncope, Psychiatric/Behavioral: Denies mood changes, confusion, nervousness, sleep disturbance and agitation.     Objective:   Physical Exam Filed Vitals:   09/05/13 0848  BP: 129/83  Pulse: 86  Temp: 97.7 F (36.5 C)  TempSrc: Oral  Height: 5' (1.524 m)  Weight: 155 lb 14.4 oz (70.716 kg)  SpO2: 98%   General: Vital signs reviewed.  Patient is well-developed and well-nourished, in no acute distress and cooperative with exam.  Neck: Supple, trachea midline, normal ROM, no JVD, masses, thyromegaly, or carotid bruit present.  Cardiovascular: RRR, S1 normal, S2 normal, no murmurs, gallops, or rubs. Pulmonary/Chest: Clear to auscultation bilaterally, no wheezes, rales, or rhonchi. Abdominal: Soft, non-tender, non-distended, BS +. Musculoskeletal: Right wrist tenderness worst with lateral motion of the wrist. No edema, or signs or trauma. No pain on palpation. No joint deformities, erythema, or stiffness.  Extremities: No lower extremity edema bilaterally,  pulses symmetric and intact bilaterally. No cyanosis or clubbing.  Skin: Warm, dry and intact. No rashes or erythema. Psychiatric: Normal mood and affect. speech and behavior is normal. Cognition and memory are normal.  Foot Exam: No lesions, ulcers, cuts or bruises. Normal sensation bilaterally.       Assessment:         Plan:

## 2013-09-05 NOTE — Assessment & Plan Note (Signed)
Patient complains of pain along medial right wrist for about 3 weeks after pushing a stroller for too long that was too heavy for her. Pain is 7/10 at worst. Advil and pressing on the tendon helps to relieve pain. Pain is increased with lateral motion of wrist. Patient has not been able to rest wrist since she is very active with her children. There is no edema, erythema or severe tenderness upon palpation of the wrist.   Plan: Ibuprofen 400-600 mg TID for 1-2 weeks with meals. Wear a wrist brace and rest wrist as much a possible. Follow up in 4-5 weeks.

## 2013-09-06 ENCOUNTER — Other Ambulatory Visit: Payer: Self-pay | Admitting: Dietician

## 2013-09-06 DIAGNOSIS — E119 Type 2 diabetes mellitus without complications: Secondary | ICD-10-CM

## 2013-09-06 NOTE — Progress Notes (Signed)
I saw and evaluated the patient.  I personally confirmed the key portions of the history and exam documented by Dr. Richardson and I reviewed pertinent patient test results.  The assessment, diagnosis, and plan were formulated together and I agree with the documentation in the resident's note. 

## 2013-10-02 ENCOUNTER — Encounter: Payer: Self-pay | Admitting: *Deleted

## 2013-10-11 ENCOUNTER — Ambulatory Visit (INDEPENDENT_AMBULATORY_CARE_PROVIDER_SITE_OTHER): Payer: Self-pay | Admitting: Internal Medicine

## 2013-10-11 VITALS — BP 118/83 | HR 91 | Temp 98.0°F | Wt 156.3 lb

## 2013-10-11 DIAGNOSIS — H6092 Unspecified otitis externa, left ear: Secondary | ICD-10-CM

## 2013-10-11 DIAGNOSIS — Z23 Encounter for immunization: Secondary | ICD-10-CM

## 2013-10-11 DIAGNOSIS — IMO0002 Reserved for concepts with insufficient information to code with codable children: Secondary | ICD-10-CM

## 2013-10-11 DIAGNOSIS — B373 Candidiasis of vulva and vagina: Secondary | ICD-10-CM

## 2013-10-11 DIAGNOSIS — M778 Other enthesopathies, not elsewhere classified: Secondary | ICD-10-CM

## 2013-10-11 DIAGNOSIS — E785 Hyperlipidemia, unspecified: Secondary | ICD-10-CM

## 2013-10-11 DIAGNOSIS — E1165 Type 2 diabetes mellitus with hyperglycemia: Secondary | ICD-10-CM

## 2013-10-11 DIAGNOSIS — I10 Essential (primary) hypertension: Secondary | ICD-10-CM

## 2013-10-11 DIAGNOSIS — Z0000 Encounter for general adult medical examination without abnormal findings: Secondary | ICD-10-CM

## 2013-10-11 DIAGNOSIS — B3731 Acute candidiasis of vulva and vagina: Secondary | ICD-10-CM

## 2013-10-11 MED ORDER — METFORMIN HCL 1000 MG PO TABS: 1000.0000 mg | ORAL_TABLET | Freq: Two times a day (BID) | ORAL | Status: DC

## 2013-10-11 MED ORDER — FLUCONAZOLE 150 MG PO TABS: 150.0000 mg | ORAL_TABLET | Freq: Every day | ORAL | Status: DC

## 2013-10-11 MED ORDER — INSULIN ASPART 100 UNIT/ML ~~LOC~~ SOLN: 4.0000 [IU] | Freq: Two times a day (BID) | SUBCUTANEOUS | Status: DC

## 2013-10-11 MED ORDER — INSULIN GLARGINE 100 UNIT/ML SOLOSTAR PEN: PEN_INJECTOR | SUBCUTANEOUS | Status: DC

## 2013-10-11 MED ORDER — HYDROCORTISONE-ACETIC ACID 1-2 % OT SOLN: 3.0000 [drp] | Freq: Three times a day (TID) | OTIC | Status: DC

## 2013-10-11 NOTE — Progress Notes (Signed)
   Subjective:    Patient ID: Robin Klein, female    DOB: 05/26/1970, 43 y.o.   MRN: [id number]  HPI Robin Klein is a 43 yo female who presents for routine follow up for her diabetes.  Patient complains of vaginal itching and white discharge for over one month. She has tried nothing for it. She had a pap smear and pelvic exam in February which was normal.  Patient complains of itching, blood, and pain on her left ear that has given her problems for a while. She denies any trouble hearing. She uses Q-tips regularly. She denies any loud noise exposures.  Patient states right wrist pain is much improved. It bothers her from time to time, but not on a daily basis.  Patient has been using Novolog 4 units before lunch and dinner without issue. Her CBGs are check 4 times a day and typically run 137 to 256. She has had no hypoglycemic episodes. She is trying to reduce her carb intake.   Review of Systems General: Denies fever, chills, fatigue, change in appetite and diaphoresis.  HEENT: left ear pain and itching. Respiratory: Denies SOB, cough, DOE, chest tightness, and wheezing.   Cardiovascular: Denies chest pain and palpitations.  Gastrointestinal: Denies nausea, vomiting, abdominal pain, diarrhea, constipation, blood in stool and abdominal distention.  Genitourinary: Admits to white discharge and vaginal itching. Denies dysuria, urgency, frequency, hematuria, suprapubic pain and flank pain. Endocrine: Denies hypoglycemic episodes. Denies hot or cold intolerance, polyuria, and polydipsia. Musculoskeletal: Denies myalgias, back pain, joint swelling, arthralgias and gait problem.  Skin: Denies pallor, rash and wounds.  Neurological: Denies dizziness, headaches, weakness, lightheadedness, numbness,seizures, and syncope, Psychiatric/Behavioral: Denies mood changes, confusion, nervousness, sleep disturbance and agitation.    Objective:   Physical Exam Filed Vitals:   10/11/13 1548  BP: 118/83  Pulse: 91  Temp: 98 F (36.7 C)  TempSrc: Oral  Weight: 156 lb 4.8 oz (70.897 kg)  SpO2: 100%   General: Vital signs reviewed.  Patient is well-developed and well-nourished, in no acute distress and cooperative with exam.  HEENT: Normal right and left tympanic membranes. Good light reflex. No fluid or disturbance of membrane. No obvious lesions within the ear or outside the ear.  Cardiovascular: RRR, S1 normal, S2 normal, no murmurs, gallops, or rubs. Pulmonary/Chest: Clear to auscultation bilaterally, no wheezes, rales, or rhonchi. Abdominal: Soft, non-tender, non-distended, BS +, no masses, organomegaly, or guarding present.  Musculoskeletal: No joint deformities, erythema, or stiffness, ROM full and nontender. Extremities: No lower extremity edema bilaterally,  pulses symmetric and intact bilaterally. No cyanosis or clubbing. Skin: Warm, dry and intact. No rashes or erythema. Psychiatric: Normal mood and affect. speech and behavior is normal. Cognition and memory are normal.      Assessment & Plan:   Please see problem based assessment and plan.

## 2013-10-11 NOTE — Patient Instructions (Signed)
General Instructions:   Please bring your medicines with you each time you come to clinic.  Medicines may include prescription medications, over-the-counter medications, herbal remedies, eye drops, vitamins, or other pills.  **Changes**  We have increased your Novolog to 5 units before lunch and Novolog 5 units before dinner. Please continue taking all of your other medications as prescribed.  We have also prescribed Fluconazole 150 mg ONCE for your yeast infection.   Progress Toward Treatment Goals:  Treatment Goal 09/05/2013  Hemoglobin A1C deteriorated  Blood pressure at goal    Self Care Goals & Plans:  Self Care Goal 09/05/2013  Manage my medications take my medicines as prescribed; bring my medications to every visit; refill my medications on time  Monitor my health keep track of my blood glucose; bring my glucose meter and log to each visit  Eat healthy foods drink diet soda or water instead of juice or soda; eat more vegetables; eat foods that are low in salt; eat baked foods instead of fried foods; eat fruit for snacks and desserts  Be physically active -    Home Blood Glucose Monitoring 09/05/2013  Check my blood sugar 3 times a day  When to check my blood sugar before breakfast; before lunch; before dinner     Care Management & Community Referrals:  Referral 09/05/2013  Referrals made for care management support diabetes educator    Yeast Infection of the Skin Some yeast on the skin is normal, but sometimes it causes an infection. If you have a yeast infection, it shows up as white or light brown patches on brown skin. You can see it better in the summer on tan skin. It causes light-colored holes in your suntan. It can happen on any area of the body. This cannot be passed from person to person. HOME CARE  Scrub your skin daily with a dandruff shampoo. Your rash may take a couple weeks to get well.  Do not scratch or itch the rash. GET HELP RIGHT AWAY IF:   You get  another infection from scratching. The skin may get warm, red, and may ooze fluid.  The infection does not seem to be getting better. MAKE SURE YOU:  Understand these instructions.  Will watch your condition.  Will get help right away if you are not doing well or get worse. Document Released: 12/05/2007 Document Revised: 03/16/2011 Document Reviewed: 12/05/2007 [facility] Patient Information 2015 [place], [place]. This information is not intended to replace advice given to you by your health care provider. Make sure you discuss any questions you have with your health care provider.

## 2013-10-12 DIAGNOSIS — B373 Candidiasis of vulva and vagina: Secondary | ICD-10-CM | POA: Insufficient documentation

## 2013-10-12 DIAGNOSIS — B3731 Acute candidiasis of vulva and vagina: Secondary | ICD-10-CM | POA: Insufficient documentation

## 2013-10-12 DIAGNOSIS — H6090 Unspecified otitis externa, unspecified ear: Secondary | ICD-10-CM | POA: Insufficient documentation

## 2013-10-12 NOTE — Assessment & Plan Note (Addendum)
Assessment: Patient complains of itching, blood, and pain on her left ear that has given her problems for a while. She denies any trouble hearing. She uses Q-tips regularly. She denies any loud noise exposures.  Plan: Prescribed otic drops with acetic acid and hydrocortisone to be used TID for symptom relief.

## 2013-10-12 NOTE — Assessment & Plan Note (Signed)
Lab Results  Component Value Date   HGBA1C 9.9 09/05/2013   HGBA1C 9.1 05/19/2013   HGBA1C 8.9 01/16/2013     Assessment: Diabetes control:  Uncontrolled Progress toward A1C goal:   Deteriorated Comments: No hypoglycemic episodes. Tolerating Novolog well.  Plan: Medications:  continue current medications, Increased Novolog to 5 units before lunch and dinner Home glucose monitoring: Frequency:  4 times a day Timing:  morning, before lunch and dinner, at bedtime Instruction/counseling given: reminded to bring blood glucose meter & log to each visit, reminded to bring medications to each visit, discussed foot care and discussed diet Educational resources provided:   Self management tools provided:   Other plans: Consider increasing Novolog and Lantus after next hemoglobin a1c check

## 2013-10-12 NOTE — Assessment & Plan Note (Signed)
Assessment: Patient states right wrist pain is much improved. It bothers her from time to time, but not on a daily basis.  Plan: Resolved. Continue use of wrist guard as needed.

## 2013-10-12 NOTE — Assessment & Plan Note (Signed)
BP Readings from Last 3 Encounters:  10/11/13 118/83  09/05/13 129/83  05/31/13 116/82    Lab Results  Component Value Date   NA 136 11/03/2012   K 4.0 11/03/2012   CREATININE 0.63 11/03/2012    Assessment: Blood pressure control:  controlled Progress toward BP goal:   at goal Comments: none  Plan: Medications:  continue current medications Educational resources provided:   Self management tools provided:   Other plans: reduce carb and salt intake

## 2013-10-18 NOTE — Progress Notes (Signed)
I saw and evaluated the patient.  I personally confirmed the key portions of Dr. Richardson's history and exam and reviewed pertinent patient test results.  The assessment, diagnosis, and plan were formulated together and I agree with the documentation in the resident's note. 

## 2013-11-06 ENCOUNTER — Encounter: Payer: Self-pay | Admitting: Internal Medicine

## 2013-11-15 ENCOUNTER — Other Ambulatory Visit: Payer: Self-pay | Admitting: Internal Medicine

## 2013-11-15 DIAGNOSIS — I10 Essential (primary) hypertension: Secondary | ICD-10-CM

## 2013-12-14 ENCOUNTER — Encounter: Payer: Self-pay | Admitting: Internal Medicine

## 2013-12-14 ENCOUNTER — Ambulatory Visit (INDEPENDENT_AMBULATORY_CARE_PROVIDER_SITE_OTHER): Payer: Self-pay | Admitting: Internal Medicine

## 2013-12-14 VITALS — BP 126/85 | HR 88 | Temp 98.0°F | Ht 60.0 in | Wt 156.5 lb

## 2013-12-14 DIAGNOSIS — I10 Essential (primary) hypertension: Secondary | ICD-10-CM

## 2013-12-14 DIAGNOSIS — E785 Hyperlipidemia, unspecified: Secondary | ICD-10-CM

## 2013-12-14 DIAGNOSIS — E1165 Type 2 diabetes mellitus with hyperglycemia: Secondary | ICD-10-CM

## 2013-12-14 DIAGNOSIS — S39012A Strain of muscle, fascia and tendon of lower back, initial encounter: Secondary | ICD-10-CM | POA: Insufficient documentation

## 2013-12-14 DIAGNOSIS — IMO0002 Reserved for concepts with insufficient information to code with codable children: Secondary | ICD-10-CM

## 2013-12-14 DIAGNOSIS — L259 Unspecified contact dermatitis, unspecified cause: Secondary | ICD-10-CM

## 2013-12-14 DIAGNOSIS — S338XXA Sprain of other parts of lumbar spine and pelvis, initial encounter: Secondary | ICD-10-CM

## 2013-12-14 LAB — BASIC METABOLIC PANEL WITH GFR
BUN: 11 mg/dL (ref 6–23)
CHLORIDE: 99 meq/L (ref 96–112)
CO2: 27 mEq/L (ref 19–32)
CREATININE: 0.58 mg/dL (ref 0.50–1.10)
Calcium: 9.2 mg/dL (ref 8.4–10.5)
Glucose, Bld: 340 mg/dL — ABNORMAL HIGH (ref 70–99)
Potassium: 3.7 mEq/L (ref 3.5–5.3)
Sodium: 136 mEq/L (ref 135–145)

## 2013-12-14 LAB — GLUCOSE, CAPILLARY: Glucose-Capillary: 356 mg/dL — ABNORMAL HIGH (ref 70–99)

## 2013-12-14 LAB — POCT GLYCOSYLATED HEMOGLOBIN (HGB A1C): Hemoglobin A1C: 10.5

## 2013-12-14 MED ORDER — INSULIN ASPART 100 UNIT/ML ~~LOC~~ SOLN: 5.0000 [IU] | Freq: Two times a day (BID) | SUBCUTANEOUS | Status: DC

## 2013-12-14 MED ORDER — HYDROCORTISONE 2.5 % EX CREA: TOPICAL_CREAM | Freq: Two times a day (BID) | CUTANEOUS | Status: DC

## 2013-12-14 MED ORDER — INSULIN GLARGINE 100 UNIT/ML SOLOSTAR PEN: 40.0000 [IU] | PEN_INJECTOR | Freq: Every day | SUBCUTANEOUS | Status: DC

## 2013-12-14 MED ORDER — AMLODIPINE BESYLATE 10 MG PO TABS
10.0000 mg | ORAL_TABLET | Freq: Every day | ORAL | Status: DC
Start: 2013-12-14 — End: 2014-03-28

## 2013-12-14 MED ORDER — NAPROXEN 500 MG PO TABS: 500.0000 mg | ORAL_TABLET | Freq: Two times a day (BID) | ORAL | Status: DC

## 2013-12-14 NOTE — Assessment & Plan Note (Signed)
Assessment: Patient complains of lower back pain on right side for one day which hurts when she walks or sits. Pain is dull achey muscular pain that occurred after she lifted heavy boxes of Christmas decorations. She has used ibuprofen and bengay which has helped. She requests something stronger. Patient denies dysuria, flank pain, hematuria, fever, chills, rash in area, loss of bowel or bladder incontinence or numbness.  Plan: -Naproxen 500 mg BID for 7 days -Continue Bengay cream

## 2013-12-14 NOTE — Assessment & Plan Note (Signed)
BP Readings from Last 3 Encounters:  12/14/13 126/85  10/11/13 118/83  09/05/13 129/83    Lab Results  Component Value Date   NA 136 11/03/2012   K 4.0 11/03/2012   CREATININE 0.63 11/03/2012    Assessment: Blood pressure control:  Controlled Progress toward BP goal:   At goal Comments: Compliant with amlodipine 10 mg daily (allergy to acei)  Plan: Medications:  continue current medications Educational resources provided:   Self management tools provided:   Other plans: none

## 2013-12-14 NOTE — Patient Instructions (Signed)
General Instructions:   Thank you for bringing your medicines today. This helps us keep you safe from mistakes.  For your Rash: -Hydrocortisone cream twice a day to the area  For your back: -Naproxen 500 mg (1 pill) twice a day with meals for one week  For your diabetes: -Lantus 40 units at night -Novolog 5 units with lunch and dinner -Metformin 1000 mg twice a day -Follow up in 1-2 weeks -PLEASE remember to take your medications and keep a record of them  Diabetes mellitus tipo2 (Type 2 Diabetes Mellitus) La diabetes mellitus tipo2, generalmente denominada diabetes tipo2, es una enfermedad prolongada (crnica). En la diabetes tipo2, el pncreas no produce suficiente insulina (una hormona), las clulas son menos sensibles a la insulina que se produce (resistencia a la insulina), o ambos. Normalmente, la Johnson Controlsinsulina mueve los azcares de los alimentos a las clulas de los tejidos. Las clulas de los tejidos Cendant Corporationutilizan los azcares para Psychiatristobtener energa. La falta de insulina o la falta de una respuesta normal a la insulina hace que el exceso de azcar se acumule en la sangre en lugar de Customer service managerpenetrar en las clulas de los tejidos. Como resultado, se producen niveles altos de Bankerazcar en la sangre (hiperglucemia). El [address]fect[address] de los niveles altos de azcar (glucosa) puede causar muchas complicaciones.  La diabetes tipo2 antes tambin se denominaba diabetes del Rangeleyadulto, pero puede ocurrir a Actuarycualquier edad.  FACTORES DE RIESGO  Una persona tiene mayor predisposicin a desarrollar diabetes tipo 2 si alguien en su familia tiene la enfermedad y tambin tiene uno o ms de los siguientes factores de riesgo principales:  Sobrepeso.  Estilo de vida sedentario.  Una historia de consumo constante de alimentos de altas caloras. Mantener un peso saludable y realizar actividad fsica regular puede reducir la probabilidad de desarrollar diabetes tipo 2. SNTOMAS  Una persona con diabetes tipo 2 no presenta  sntomas en un principio. Los sntomas de la diabetes tipo 2 aparecen lentamente. Los sntomas son:  Aumento de la sed (polidipsia).  Aumento de la miccin (poliuria).  Orina con ms frecuencia durante la noche (nocturia).  Prdida de peso. La prdida de peso puede ser muy rpida.  Infecciones frecuentes y recurrentes.  Cansancio (fatiga).  Debilidad.  Cambios en la visin, como visin borrosa.  Olor a Water quality scientistfruta en el aliento.  Dolor abdominal.  Nuseas o vmitos.  Cortes o moretones que tardan en sanarse.  Hormigueo o adormecimiento de las manos y los pies. DIAGNSTICO Con frecuencia la diabetes tipo 2 no se diagnostica hasta que se presentan las complicaciones de la diabetes. La diabetes tipo 2 se diagnostica cuando los sntomas o las complicaciones se presentan y cuando aumentan los niveles de glucosa en la [place]angre. El nivel de glucosa en la sangre puede controlarse en uno o ms de los siguientes anlisis de sangre:  Medicin de glucosa en la sangre en [place]unas. No se le permitir comer durante al menos 8 horas antes de que se tome Colombiauna muestra de [place]ale[place]angre.  Pruebas al azar de glucosa en la sangre. El nivel de glucosa en la sangre se controla en cualquier momento del da sin importar el momento en que haya comido.  Prueba de A1c (hemoglobina glucosilada) Una prueba de A1c proporciona informacin sobre el control de la glucosa en la sangre durante los ltimos 3 meses.  Prueba de tolerancia a la glucosa oral (PTGO). La glucosa en la sangre se mide despus de no haber comido (ayunas) durante dos horas y despus de beber Neomia Dearuna bebida  que contenga glucosa. TRATAMIENTO   Usted puede necesitar administrarse insulina o medicamentos para la diabetes todos los das para State Street Corporationmantener los niveles de glucosa en la sangre en el rango deseado.  Si B[place]ausa insulina, tal vez necesite ajustar la dosis segn los carbohidratos que haya consumido en cada comida o colacin. El objetivo del tratamiento es mantener  el nivel de azcar en la sangre previo a comer (glucosa preprandial) entre 5970 y 130mg /dl. INSTRUCCIONES PARA EL CUIDADO EN EL HOGAR   Controle su nivel de hemoglobina A1c dos veces al ao.  Contrlese a diario Air cabin crewel nivel de glucosa en la sangre segn las indicaciones de su mdico.  Supervise las cetonas en la orina cuando est enferma y segn las indicaciones de su mdico.  Tome el medicamento para la diabetes o adminstrese insulina segn las indicaciones de su mdico para Radio producermantener el nivel de glucosa en la sangre en el rango deseado.  Nunca se quede sin medicamento para la diabetes o sin insulina. Es necesario que la reciba CarMaxtodos los das.  Si [place]a insulina, tal vez deba ajustar la cantidad de insulina administrada segn los carbohidratos consumidos. Los hidratos de carbono pueden aumentar los niveles de glucosa en la sangre, pero deben incluirse en su dieta. Los hidratos de carbono aportan vitaminas, minerales y [place]stownfibra que son Neomia Dearuna parte esencial de una dieta saludable. Los hidratos de carbono se encuentran en frutas, verduras, cereales integrales, productos lcteos, legumbres y alimentos que contienen azcares aadidos.  Consuma alimentos saludables. Programe una cita con un nutricionista certificado para que lo ayude a Chief Executive Officerestablecer un plan de alimentacin adecuado para usted.  Baje de peso si es necesario.  Lleve una tarjeta de alerta mdica o use una pulsera o medalla de alerta mdica.  Lleve con usted una colacin de 15gramos de hidratos de carbono en todo momento para controlar los niveles bajos de glucosa en la sangre (hipoglucemia). Algunos ejemplos de colaciones de 15gramos de hidratos de carbono son los siguientes:  Tabletas de glucosa, 3 o 4.  Gel de glucosa, tubo de 15 gramos.  Pasas de uva, 2 cucharadas (24 gramos).  Caramelos de goma, 6.  Galletas de Kino Springsanimales, 8.  Gaseosa comn, 4onzas (120mililitros).  Pastillas de goma, 9.  Reconocer la hipoglucemia. La  hipoglucemia se produce cuando los niveles de glucosa en la sangre son de 70 mg/dl o menos. El riesgo de hipoglucemia aumenta durante el ayuno o cuando se saltea las comidas, durante o despus de Education officer, environmentalrealizar ejercicio intenso y Rosebudmientras duerme. Los sntomas de hipoglucemia son:  Temblores o sacudidas.  Disminucin de la capacidad de concentracin.  Sudoracin.  Aumento de la frecuencia cardaca.  Dolor de Turkmenistancabeza.  Sequedad en la boca.  Hambre.  Irritabilidad.  Ansiedad.  Sueo agitado.  Alteracin del habla o de la coordinacin.  Confusin.  Tratar la hipoglucemia rpidamente. Si usted est alerta y puede tragar con seguridad, siga la regla de 15/15 que consiste en:  Norfolk Southernome entre 15 y 20gramos de glucosa de accin rpida o carbohidratos. Las opciones de accin rpida son un gel de glucosa, tabletas de glucosa, o 4 onzas (120 ml) de jugo de frutas, gaseosa comn, o leche baja en grasa.  Compruebe su nivel de glucosa en la sangre 15 minutos despus de tomar la glucosa.  Tome entre 15 y 20 gramos ms de glucosa si el nivel de glucosa en la sangre todava es de 70mg /dl o inferior.  Ingiera una comida o una colacin en el lapso de 1 hora una vez que  los niveles de glucosa en la sangre vuelven a la normalidad.  Est atento a si siente mucha sed u orina con mayor frecuencia, porque son signos tempranos de hiperglucemia. El reconocimiento temprano de la hiperglucemia permite un tratamiento oportuno. Trate la hiperglucemia segn le indic su mdico.  Haga, al menos, de actividad fsica moderada a la semana, distribuidos en, por lo menos, 3das a la semana o como lo indique su mdico. Adems, debe realizar ejercicios de resistencia por lo menos 2veces a la semana o como lo indique su mdico. Trate de no permanecer inactivo durante ms de seguidos.  Ajuste su medicamento y la ingesta de alimentos, segn sea necesario, si inicia un nuevo ejercicio o deporte.  Siga  su plan para los [address] de enfermedad cuando no puede comer o beber como de [place].  No consuma ningn producto que contenga tabaco, como cigarrillos, tabaco de Theatre manager o Administrator, Civil Service. Si necesita ayuda para dejar de fumar, hable con el mdico.  Limite el consumo de alcohol a no ms de 1 medida por da en las mujeres no embarazadas y 2 medidas en los hombres. Debe beber alcohol solo mientras come. Hable con su mdico acerca de si el alcohol es seguro para usted. Informe a su mdico si bebe alcohol varias veces a la semana.  Concurra a todas las visitas de control como se lo haya indicado el mdico. Esto es importante.  Programe un examen de la vista poco despus del diagnstico de diabetes tipo 2 y luego anualmente.  Realice diariamente el cuidado de la piel y de los pies. Examine su piel y los pies diariamente para ver si tiene cortes, moretones, enrojecimiento, problemas en las uas, sangrado, ampollas o Advertising account planner. Su mdico debe hacerle un examen de los pies una vez por ao.  Cepllese los dientes y encas por lo menos dos veces al da y use hilo dental al menos una vez por da. Concurra regularmente a las visitas de control con el dentista.  Comparta su plan de control de diabetes en el trabajo o en la escuela.  Mantngase al da con las vacunas. Se recomienda que las [employer] de [id number] con diabetes se apliquen la vacuna contra la neumona. En algunos casos, pueden administrarse dos inyecciones separadas. Pregntele al mdico si tiene la vacuna contra la neumona al da.  Aprenda a Dealer.  Obtenga la mayor cantidad posible de informacin sobre la diabetes y solicite ayuda siempre que sea necesario.  Busque programas de rehabilitacin y participe en ellos para mantener o mejorar su independencia y su calidad de vida. Solicite la derivacin a fisioterapia o terapia ocupacional si se le [facility] o la mano, o tiene problemas para asearse, vestirse, comer, o  durante la [place] fsica. SOLICITE ATENCIN MDICA SI:   No puede comer alimentos o beber por ms de 6 horas.  Tuvo nuseas o ha vomitado durante ms de 6 horas.  Su nivel de glucosa en la sangre es mayor de 240 mg/dl.  Presenta algn cambio en el estado mental.  Desarrolla una enfermedad grave adicional.  Tuvo diarrea durante ms de 6 horas.  Ha estado enfermo o ha tenido fiebre durante un par [address] y no mejora.  Siente dolor al practicar cualquier actividad fsica. SOLICITE ATENCIN MDICA DE INMEDIATO SI:  Tiene dificultad para respirar.  Tiene niveles de cetonas moderados a altos. ASEGRESE DE QUE:  Comprende estas instrucciones.  Controlar su afeccin.  Recibir ayuda de inmediato si no mejora o  si empeora. Document Released: 12/22/2004 Document Revised: 05/08/2013 [facility] Patient Information 2015 [place], [place]. This information is not intended to replace advice given to you by your health care provider. Make sure you discuss any questions you have with your health care provider.

## 2013-12-14 NOTE — Assessment & Plan Note (Signed)
Lab Results  Component Value Date   HGBA1C 10.5 12/14/2013   HGBA1C 9.9 09/05/2013   HGBA1C 9.1 05/19/2013     Assessment: Diabetes control:  Uncontrolled Progress toward A1C goal:   Deteriorated Comments: Patient admits she has been non-compliant with her medications, missing several doses of novolog, lantus and metformin. She forgets when she takes what.  Plan: Medications:  Continue Metformin 1000 mg BID, Novolog 5 units with lunch and dinner, Increase Lantus to 40 units QHS Home glucose monitoring: Frequency:  4 times daily Timing:  before breakfast, lunch, and dinner and before bedtime Instruction/counseling given: reminded to bring medications to each visit, discussed the need for weight loss, discussed diet, discussed sick day management and provided printed educational material Educational resources provided:   Self management tools provided: copy of home glucose meter download Other plans: We have increased Lantus to 40 units QHS, but the biggest change needed to be made is compliance with medications. I have encouraged her to keep an accurate log of when she takes her medications. Follow up in 1-2 weeks.

## 2013-12-14 NOTE — Assessment & Plan Note (Signed)
Assessment: Patient has erythema and irritation with pinpoint lesions in a band like distribution around her ankles. She denies any bug bites, changes in soaps, lotions, shoes or socks. She has not tried anything for the rash and she is the only one who has it. It is not located anywhere else on her body. The rash is itchy in the morning. Rash is most consistent with contact dermatitis.  Plan: -Hydrocortisone cream 2.5% BID to affected area -Reassess in 1-2 weeks

## 2013-12-14 NOTE — Progress Notes (Signed)
Subjective:    Patient ID: Robin Klein, female    DOB: 08/03/1970, 43 y.o.   MRN: [id number]  HPI [name] is a 43 yo female with PMHx of HTN, T2DM, HLD, and MDD who presents for follow up. Please see problem oriented assessment and plan for more information.  Review of Systems  General: Denies fever, chills, fatigue, change in appetite and diaphoresis.  Respiratory: Denies SOB, cough, DOE, chest tightness, and wheezing.   Cardiovascular: Denies chest pain and palpitations.  Gastrointestinal: Denies nausea, vomiting, abdominal pain, diarrhea, constipation Genitourinary: Denies dysuria, urgency, frequency, suprapubic pain and flank pain. Musculoskeletal: Admits to low back pain on right. Denies joint swelling, arthralgias and gait problem.  Skin: Admits to bilateral rash over ankles. Denies pallor and wounds.  Neurological: Denies dizziness, headaches, weakness, lightheadedness, numbness,seizures, and syncope,  Past Medical History  Diagnosis Date  . Diabetes mellitus   . Hypertension   . Fatty liver disease, nonalcoholic   . Depression   . Sinusitis    Current Outpatient Prescriptions on File Prior to Visit  Medication Sig Dispense Refill  . acetic acid-hydrocortisone (VOSOL-HC) otic solution Place 3 drops into the left ear 3 (three) times daily. 10 mL 0  . amLODipine (NORVASC) 10 MG tablet TAKE ONE TABLET BY MOUTH ONCE DAILY 30 tablet 6  . aspirin 81 MG tablet Take 81 mg by mouth daily.    . benzonatate (TESSALON) 100 MG capsule Take 1 capsule (100 mg total) by mouth every 8 (eight) hours. 21 capsule 0  . Blood Glucose Monitoring Suppl (WAVESENSE PRESTO PRO METER) DEVI Use to check blood sugar up to 2 times daily     . diclofenac sodium (VOLTAREN) 1 % GEL Apply 2 g topically 4 (four) times daily. 1 Tube 0  . fluconazole (DIFLUCAN) 150 MG tablet Take 1 tablet (150 mg total) by mouth daily. 1 tablet 0  . glucose blood (WAVESENSE PRESTO TEST) test strip Use  to test blood sugar up to 2 times daily     . insulin aspart (NOVOLOG) 100 UNIT/ML injection Inject 4 Units into the skin 2 (two) times daily with a meal. Inject 15 minutes before lunch and dinner. 10 mL 11  . Insulin Glargine (LANTUS SOLOSTAR) 100 UNIT/ML Solostar Pen Inject 35 units under the skin as instructed 45 mL 4  . Insulin Syringe-Needle U-100 (GNP INSULIN SYRINGE) 31G X 5/16" 0.5 ML MISC Use to inject insulin once daily     . Lancets MISC Use to check blood sugar as directed     . metFORMIN (GLUCOPHAGE) 1000 MG tablet Take 1 tablet (1,000 mg total) by mouth 2 (two) times daily with a meal. 180 tablet 3  . Prenatal Vit-Fe Fumarate-FA (PRENATAL 1 PLUS 1 PO) Take 1 tablet by mouth.      . rosuvastatin (CRESTOR) 5 MG tablet Take 1 tablet (5 mg total) by mouth at bedtime. 30 tablet 1   No current facility-administered medications on file prior to visit.      Objective:   Physical Exam Filed Vitals:   12/14/13 0821  BP: 126/85  Pulse: 88  Temp: 98 F (36.7 C)  TempSrc: Oral  Height: 5' (1.524 m)  Weight: 156 lb 8 oz (70.988 kg)  SpO2: 100%   General: Vital signs reviewed.  Patient is well-developed and well-nourished, in no acute distress and cooperative with exam.  Cardiovascular: RRR, S1 normal, S2 normal, no murmurs, gallops, or rubs. Pulmonary/Chest: Clear to auscultation bilaterally, no wheezes,  rales, or rhonchi. Abdominal: Soft, non-tender, non-distended, BS + Musculoskeletal: No joint deformities, erythema, or stiffness, ROM full and nontender. Extremities: No lower extremity edema bilaterally,  pulses symmetric and intact bilaterally. Skin: Bilateral erythematous rash with pinpoint lesions over ankles. Psychiatric: Normal mood and affect. speech and behavior is normal.     Assessment & Plan:   Please see problem based assessment and plan.

## 2013-12-15 NOTE — Progress Notes (Signed)
Internal Medicine Clinic Attending  I saw and evaluated the patient.  I personally confirmed the key portions of the history and exam documented by Dr. Richardson and I reviewed pertinent patient test results.  The assessment, diagnosis, and plan were formulated together and I agree with the documentation in the resident's note. 

## 2013-12-21 ENCOUNTER — Other Ambulatory Visit: Payer: Self-pay | Admitting: Internal Medicine

## 2013-12-21 ENCOUNTER — Encounter: Payer: Self-pay | Admitting: Internal Medicine

## 2013-12-21 ENCOUNTER — Ambulatory Visit (INDEPENDENT_AMBULATORY_CARE_PROVIDER_SITE_OTHER): Payer: Self-pay | Admitting: Internal Medicine

## 2013-12-21 ENCOUNTER — Ambulatory Visit: Payer: Self-pay | Admitting: Dietician

## 2013-12-21 VITALS — BP 116/81 | HR 64 | Temp 98.1°F | Ht 60.0 in | Wt 155.6 lb

## 2013-12-21 DIAGNOSIS — IMO0002 Reserved for concepts with insufficient information to code with codable children: Secondary | ICD-10-CM

## 2013-12-21 DIAGNOSIS — E1165 Type 2 diabetes mellitus with hyperglycemia: Secondary | ICD-10-CM

## 2013-12-21 DIAGNOSIS — S338XXD Sprain of other parts of lumbar spine and pelvis, subsequent encounter: Secondary | ICD-10-CM

## 2013-12-21 DIAGNOSIS — L259 Unspecified contact dermatitis, unspecified cause: Secondary | ICD-10-CM

## 2013-12-21 DIAGNOSIS — I10 Essential (primary) hypertension: Secondary | ICD-10-CM

## 2013-12-21 DIAGNOSIS — S39012D Strain of muscle, fascia and tendon of lower back, subsequent encounter: Secondary | ICD-10-CM

## 2013-12-21 LAB — GLUCOSE, CAPILLARY: Glucose-Capillary: 219 mg/dL — ABNORMAL HIGH (ref 70–99)

## 2013-12-21 MED ORDER — INSULIN ASPART 100 UNIT/ML ~~LOC~~ SOLN: 5.0000 [IU] | Freq: Three times a day (TID) | SUBCUTANEOUS | Status: DC

## 2013-12-21 NOTE — Patient Instructions (Signed)
General Instructions:   Thank you for bringing your medicines today. This helps [place] keep you safe from mistakes.  GREAT JOB with taking care of your diabetes! You are doing much better. Keep up the good work!  La diabetes mellitus y los alimentos (Diabetes Mellitus and Food) Es importante que controle su nivel de azcar en la sangre (glucosa). El nivel de glucosa en sangre depende en gran medida de lo que usted come. Comer alimentos saludables en las cantidades [place] a lo largo del Futures trader, aproximadamente a la misma hora [employer], lo ayudar a Chief Operating Officer su nivel de Event organiser. Tambin puede ayudarlo a retrasar o Fish farm manager de la diabetes mellitus. Comer de [employer] saludable incluso puede ayudarlo a Event organiser de presin arterial y a Barista o Pharmacologist un peso saludable.  CMO PUEDEN AFECTARME LOS ALIMENTOS? Carbohidratos Los carbohidratos afectan el nivel de glucosa en sangre ms que cualquier otro tipo de alimento. El nutricionista lo ayudar a Chief Strategy Officer cuntos carbohidratos puede consumir en cada comida y ensearle a contarlos. El recuento de carbohidratos es importante para mantener la glucosa en sangre en un nivel saludable, en especial si utiliza insulina o toma determinados medicamentos para la diabetes mellitus. Alcohol El alcohol puede provocar disminuciones sbitas de la glucosa en sangre (hipoglucemia), en especial si utiliza insulina o toma determinados medicamentos para la diabetes mellitus. La hipoglucemia es una afeccin que puede poner en peligro la vida. Los sntomas de la hipoglucemia (somnolencia, mareos y Administrator) son similares a los sntomas de haber consumido mucho alcohol.  Si el mdico lo autoriza a beber alcohol, hgalo con moderacin y siga estas pautas:  Las mujeres no deben beber ms de un trago por da, y los hombres no deben beber ms de dos tragos por Futures trader. Un trago es igual a:  12 onzas (355 ml) de cerveza  5 onzas de vino  (150 ml) de vino  1,5onzas (45ml) de bebidas espirituosas  No beba con el estmago vaco.  Mantngase hidratado. Beba agua, gaseosas dietticas o t helado sin azcar.  Las gaseosas comunes, los jugos y otros refrescos podran contener muchos carbohidratos y se Heritage manager. QU ALIMENTOS NO SE RECOMIENDAN? Cuando haga las elecciones de alimentos, es importante que recuerde que todos los alimentos son distintos. Algunos tienen menos nutrientes que otros por porcin, aunque podran tener la misma cantidad de caloras o carbohidratos. Es difcil darle al cuerpo lo que necesita cuando consume alimentos con menos nutrientes. Estos son algunos ejemplos de alimentos que debera evitar ya que contienen muchas caloras y carbohidratos, pero pocos nutrientes: 1. Grasas trans (la mayora de los alimentos procesados incluyen grasas trans en la etiqueta de Informacin nutricional). 2. Gaseosas comunes. 3. Jugos. 4. Caramelos. 5. Dulces, como tortas, pasteles, rosquillas y galletas. 6. Comidas fritas. QU ALIMENTOS PUEDO COMER? Consuma alimentos ricos en nutrientes, que nutrirn el cuerpo y lo mantendrn saludable. Los alimentos que debe comer tambin dependern de varios factores, como: 1. Las caloras que necesita. 2. Los medicamentos que toma. 3. Su peso. 4. El nivel de glucosa en [place]. 5. El nivel de presin arterial. 6. El nivel de [place]. Tambin debe consumir una variedad de alimentos, como: 1. Protenas, como carne, aves, pescado, tofu, frutos secos y semillas (las protenas de [place] magros son mejores). 2. Nils [place]. 3. Verduras. 4. Productos lcteos, como [place], queso y yogur (descremados son mejores). 5. Panes, granos, pastas, cereales, arroz y frijoles. 6. Grasas, como aceite de [place], [place] sin grasas trans, aceite de canola,  aguacate y [place]. TODOS LOS QUE PADECEN DIABETES MELLITUS TIENEN EL MISMO PLAN DE COMIDAS? Dado que todas las personas que padecen diabetes  mellitus son distintas, no hay un solo plan de comidas que funcione para todos. Es muy importante que se rena con un nutricionista que lo ayudar a crear un plan de comidas adecuado para usted. Document Released: 03/31/2007 Document Revised: 12/27/2012 [facility] Patient Information 2015 [place], [place]. This information is not intended to replace advice given to you by your health care provider. Make sure you discuss any questions you have with your health care provider.  Cmo y dnde aplicar inyecciones de insulina por va subcutnea en adultos (How and Where to Give Subcutaneous Insulin Injections, Adult) Las personas con diabetes de tipo 1 deben administrarse insulina debido a que sus cuerpos no la producen. Las personas con diabetes tipo 2 pueden requerir insulina. Existen muchos tipos diferentes de [place], as como tambin otros medicamentos inyectables para la diabetes, que se deben Energy manager la capa de tejido graso que se encuentra debajo de la piel. El tipo de insulina o el medicamento inyectable para la diabetes que tome puede determinar cuntas inyecciones deber administrarse y cundo deben aplicarse.  ELEGIR UNA ZONA PARA LA INYECCIN: La absorcin de insulina vara de un sitio a otro. Al igual que con cualquier medicamento inyectable, se recomienda inyectar la insulina dentro de la misma regin del cuerpo. Sin embargo, la insulina no debe inyectarse en la misma zona cada vez que se administre. Rotar las zonas para las Investment banker, corporate la inflamacin o la degradacin de los tejidos. Hay cuatro regiones principales que pueden utilizarse para las inyecciones. Las regiones incluyen:  Abdomen (regin de preferencia, especialmente para medicamentos inyectables para la diabetes diferentes de la insulina).  La parte anterior y superior externa de los muslos.  La parte posterior superior del brazo  Los glteos UTILIZACIN DE LA [place] AMPOLLA Administrar insulina: nica  dosis 7. Lave sus manos con agua y [place]. 8. Haga rodar suavemente el frasco de insulina (ampolla) entre sus manos para [place]. No agite la ampolla. 9. Limpie el tapn de goma de la ampolla con un hisopo con alcohol. Asegrese de eliminar la parte plstica superior de las ampollas nuevas. 10. Retire la cubierta plstica de la aguja que est en la [place]. No deje que la aguja toque nada. 11. Tire del mbolo para extraer el aire de la [place]. Debe tener la misma cantidad de aire que de dosis de [place]. 12. Inserte la aguja a travs de la tapa de goma de la ampolla. No d vuelta la ampolla. 13. Empuje todo el mbolo para pasar el aire a la ampolla. 14. Deje la aguja en la ampolla. Luego, d vuelta la ampolla y la [place]. 15. Tire lentamente del mbolo, para que ingrese la cantidad de insulina que necesita a Research scientist (life sciences). 16. Observe si quedaron burbujas de [employer]. Podr necesitar empujar el mbolo hacia arriba y [place] abajo 2 o 3 veces para eliminar las burbujas de aire de la [place]. 17. Tire nuevamente del mbolo para conseguir la dosis correcta. 18. Quite la aguja de la ampolla. 19. Utilice una toallita impregnada en alcohol para limpiar la zona en la que se inyectar. 20. Inyctela, aproximadamente, 1 pulgada (2,5 cm) por debajo de la superficie de la piel, y [place]. 21. Coloque la aguja derecha en la piel (en un ngulo de 90 grados). Inserte la aguja en toda su extensin (hasta la unin con la [place]). En  adultos de talla pequea y que tienen poca grasa, deber inyectarla en un ngulo de 45 grados. 22. Cuando la aguja est insertada, puede soltar la piel. 23. Empuje el mbolo hasta el final para Community education officerinyectar el medicamento. 24. Para extraer, tire la aguja en forma recta. 25. Presione la toallita embebida en alcohol sobre el sitio en que aplic la inyeccin. Sostngalo all por algunos segundos. No frote la zona. 26. No vuelva a colocar la cubierta plstica en la  aguja. Administrar insulina: mezcla de 2 tipos de insulina 7. Lave sus manos con agua y [place]jabn. 8. Haga rodar suavemente el frasco (ampolla) de la insulina "turbia" entre sus manos o grelo hacia abajo para Drummondmezclarla. 9. Limpie la parte superior de la ampolla con un hisopo con alcohol. Asegrese de eliminar la tapa plstica superior de las ampollas nuevas. 10. Asegrese de que entre a la jeringa la misma cantidad de aire como de Guaminsulina "turbia" que necesita. 11. Coloque la aguja de la [place] en el envase de insulina "turbia" e inyecte aire. Asegrese de que la ampolla est [place] arriba. 12. Quite la aguja de la ampolla de Guaminsulina "turbia". 13. Tire de la [place] para que entre tanto aire como la cantidad de insulina "cristalina" que necesita. 14. Coloque la aguja de la Pacific Mutualjeringa en el envase de insulina "cristalina" e inyecte aire. 15. Deje la aguja en la ampolla de insulina "cristalina" y luego pngala boca abajo. 16. Tire lentamente del mbolo para que la cantidad de insulina "cristalina" deseada ingrese a la [place]. 17. Observe si quedaron burbujas de E. I. du Pontaire en la jeringa. Podr necesitar empujar el mbolo hacia arriba y [place] Hillshacia abajo 2 o 3 veces para eliminar las burbujas de aire de la [place]jeringa. 18. Quite la aguja de la ampolla de insulina "cristalina". 19. Coloque la aguja en la ampolla de insulina "turbia". No inyecte la insulina "cristalina" en el frasco de la "turbia". 20. Coloque la ampolla de la insulina "turbia" Phoebe Sharpshacia abajo y tire del mbolo hasta que quede la misma cantidad total de insulina "cristalina" e insulina "turbia". 21. Quite la aguja de la ampolla de Guaminsulina "turbia". 22. Utilice una toallita impregnada en alcohol para limpiar la zona en la que se inyectar. 23. Coloque la aguja derecha en la piel (en un ngulo de 90 grados). Inserte la aguja en toda su extensin (hasta la unin con la [place]). En adultos de talla pequea y que tienen poca grasa, deber inyectarla en un ngulo  de 45 grados. 24. Cuando la aguja est insertada, puede soltar la piel. 25. Empuje el mbolo hasta el final para Community education officerinyectar el medicamento. 26. Para extraer, tire la aguja en forma recta. 27. Presione la toallita embebida en alcohol sobre el sitio en que aplic la inyeccin. Sostngalo all por algunos segundos. No frote la zona. 28. No vuelva a colocar la cubierta plstica en la aguja. UTILIZACIN DE LAPICERAS DE INSULINA 7. Lave sus manos con agua y [place]jabn. 8. Si est utilizando insulina "turbia" haga rodar la lapicera entre sus manos varias veces o grela de arriba [place]hacia abajo. 9. Retire la tapa de la lapicera de insulina. 10. Limpie el tapn de goma del cartucho con una toallita impregnada en alcohol. 11. Quite la etiqueta protectora de la aguja desechable. 12. Enrosque la aguja en la lapicera. 13. Retire la cubierta plstica externa protectora de la aguja. 14. Retire la cubierta plstica interna protectora de la aguja. 15. Prepare la lapicera de insulina colocando el botn (dial) a 2 unidades. Sostenga la lapicera apuntando  hacia arriba y empuje el dial hasta que aparezca una gota de insulina en la punta de la aguja. Si esto no ocurre, Paediatric nurserepita este paso. 16. Coloque en el dial el nmero de unidades de insulina que Murphyinyectar. 17. Utilice una toallita impregnada en alcohol para limpiar la zona en la que se inyectar. 18. Inyctela, aproximadamente, 1 pulgada (2,5 cm) por debajo de la superficie de la piel, y [place]ngala. 19. Coloque la aguja derecha en la piel (en un ngulo de 90 grados). 20. Empuje el botn (dial) hacia abajo para que la insulina ingrese en el tejido [place] Bridgegraso. 21. Cuente hasta 10 lentamente. Luego quite la [place] d'Ivo[place]aguja del tejido Journalist, newspapergraso. 22. Coloque la cubierta plstica externa en la aguja y desenrsquela. COMO DESECHAR LOS ELEMENTOS  Deseche las jeringas usadas en un recipiente especial para objetos cortopunzantes. Siga las indicaciones de las normas de la zona en que usted  vive.  Las H. J. Heinzampollas y las lapiceras desechables pueden arrojarse en un cesto de basura comn. Document Released: 12/22/2004 Document Revised: 12/27/2012 [facility] Patient Information 2015 [place], [place]. This information is not intended to replace advice given to you by your health care provider. Make sure you discuss any questions you have with your health care provider.

## 2013-12-21 NOTE — Progress Notes (Signed)
Subjective:    Patient ID: Robin Klein, female    DOB: 05/31/1970, 43 y.o.   MRN: [id number]  HPI Robin Klein is a 43 yo female with PMHx of HTN, T2DM, HLD who presents for 1 week follow up. Please see problem oriented assessment and plan for more information.  Review of Systems General: Denies fever, chills, fatigue Respiratory: Denies SOB, cough Cardiovascular: Denies chest pain and palpitations.  Gastrointestinal: Denies nausea, vomiting, abdominal pain Musculoskeletal: Denies myalgias, back pain, arthralgias and gait problem.  Skin: Rash improved. Neurological: Denies dizziness, headaches, weakness, lightheadedness Psychiatric/Behavioral: Denies mood changes, confusion, nervousness, sleep disturbance and agitation.  Past Medical History  Diagnosis Date  . Diabetes mellitus   . Hypertension   . Fatty liver disease, nonalcoholic   . Depression   . Sinusitis    Outpatient Encounter Prescriptions as of 12/21/2013  Medication Sig  . amLODipine (NORVASC) 10 MG tablet Take 1 tablet (10 mg total) by mouth daily.  Marland Kitchen. aspirin 81 MG tablet Take 81 mg by mouth daily.  . Blood Glucose Monitoring Suppl (WAVESENSE PRESTO PRO METER) DEVI Use to check blood sugar up to 2 times daily   . diclofenac sodium (VOLTAREN) 1 % GEL Apply 2 g topically 4 (four) times daily.  Marland Kitchen. glucose blood (WAVESENSE PRESTO TEST) test strip Use to test blood sugar up to 2 times daily   . hydrocortisone 2.5 % cream Apply topically 2 (two) times daily.  . insulin aspart (NOVOLOG) 100 UNIT/ML injection Inject 5 Units into the skin 2 (two) times daily with a meal. Inject 15 minutes before lunch and dinner.  . Insulin Glargine (LANTUS SOLOSTAR) 100 UNIT/ML Solostar Pen Inject 40 Units into the skin daily at 10 pm.  . Insulin Syringe-Needle U-100 (GNP INSULIN SYRINGE) 31G X 5/16" 0.5 ML MISC Use to inject insulin once daily   . Lancets MISC Use to check blood sugar as directed   . metFORMIN  (GLUCOPHAGE) 1000 MG tablet Take 1 tablet (1,000 mg total) by mouth 2 (two) times daily with a meal.  . naproxen (NAPROSYN) 500 MG tablet Take 1 tablet (500 mg total) by mouth 2 (two) times daily with a meal.  . Prenatal Vit-Fe Fumarate-FA (PRENATAL 1 PLUS 1 PO) Take 1 tablet by mouth.    . rosuvastatin (CRESTOR) 5 MG tablet Take 1 tablet (5 mg total) by mouth at bedtime.  . [DISCONTINUED] acetic acid-hydrocortisone (VOSOL-HC) otic solution Place 3 drops into the left ear 3 (three) times daily.  . [DISCONTINUED] benzonatate (TESSALON) 100 MG capsule Take 1 capsule (100 mg total) by mouth every 8 (eight) hours.  . [DISCONTINUED] fluconazole (DIFLUCAN) 150 MG tablet Take 1 tablet (150 mg total) by mouth daily.      Objective:   Physical Exam Filed Vitals:   12/21/13 0933  BP: 116/81  Pulse: 64  Temp: 98.1 F (36.7 C)  TempSrc: Oral  Height: 5' (1.524 m)  Weight: 155 lb 9.6 oz (70.58 kg)  SpO2: 100%   General: Vital signs reviewed.  Patient is well-developed and well-nourished, in no acute distress and cooperative with exam.  Cardiovascular: RRR, S1 normal, S2 normal, no murmurs, gallops, or rubs. Pulmonary/Chest: Clear to auscultation bilaterally, no wheezes, rales, or rhonchi. Abdominal: Soft, non-tender, non-distended, BS + Extremities: No lower extremity edema bilaterally  Skin: Warm, dry and intact. No rashes or erythema. Psychiatric: Normal mood and affect. speech and behavior is normal. Cognition and memory are normal.     Assessment & Plan:  Please see problem based assessment and plan.

## 2013-12-21 NOTE — Assessment & Plan Note (Signed)
Resolved after Naproxen 500 mg BID for 7 days

## 2013-12-21 NOTE — Assessment & Plan Note (Addendum)
Lab Results  Component Value Date   HGBA1C 10.5 12/14/2013   HGBA1C 9.9 09/05/2013   HGBA1C 9.1 05/19/2013     Assessment: Diabetes control:  Uncontrolled Progress toward A1C goal:   Improving Comments: At last visit, we increased Lantus to 40 units QHS and Increased Novolog to 5 units TID with meals. Patient has been very compliant with Lantus, Metformin and Novolog but has only been taking Novolog BID instead of TID. She has done a great job recording her blood glucose and times of medications and even exercise! According to her log, her blood glucose is much improved 136 with a high of 325. She would benefit from additional Novolog 5 units with breakfast.  Plan: Medications:  continue current medications increased Novolog to 5 units TID Home glucose monitoring: Frequency:  3-4 times day Timing:  Before breakfast, before lunch, before dinner and at bedtime Instruction/counseling given: reminded to bring blood glucose meter & log to each visit, reminded to bring medications to each visit, discussed diet and provided printed educational material Educational resources provided: brochure (has information) Self management tools provided: copy of home glucose meter download Other plans: Return in 3 months for follow up

## 2013-12-21 NOTE — Assessment & Plan Note (Signed)
Resolved with Hydrocortisone 2.5% cream

## 2013-12-25 NOTE — Progress Notes (Signed)
Internal Medicine Clinic Attending  I saw and evaluated the patient.  I personally confirmed the key portions of the history and exam documented by Dr. Richardson and I reviewed pertinent patient test results.  The assessment, diagnosis, and plan were formulated together and I agree with the documentation in the resident's note. 

## 2014-01-25 ENCOUNTER — Ambulatory Visit: Payer: Self-pay

## 2014-01-29 ENCOUNTER — Telehealth: Payer: Self-pay | Admitting: *Deleted

## 2014-01-29 NOTE — Telephone Encounter (Signed)
Walk-in: pt here [facility]never received the new dose of Lantus.  Pt was seen Dec 10th by DrRichardson-lantus was increased from 30 to 40 units daily at 10pm. Rx called into pharmacy.Criss Alvine.Kavita Bartl Cassady1/25/20169:22 AM

## 2014-03-27 ENCOUNTER — Telehealth: Payer: Self-pay | Admitting: Internal Medicine

## 2014-03-27 NOTE — Telephone Encounter (Signed)
Call to patient to confirm appointment for 03/28/14 at 3:45 lmtcb with interpreter

## 2014-03-28 ENCOUNTER — Ambulatory Visit (INDEPENDENT_AMBULATORY_CARE_PROVIDER_SITE_OTHER): Payer: Self-pay | Admitting: Internal Medicine

## 2014-03-28 ENCOUNTER — Encounter: Payer: Self-pay | Admitting: Internal Medicine

## 2014-03-28 VITALS — BP 136/83 | HR 87 | Temp 98.2°F | Ht 60.0 in | Wt 156.4 lb

## 2014-03-28 DIAGNOSIS — IMO0002 Reserved for concepts with insufficient information to code with codable children: Secondary | ICD-10-CM

## 2014-03-28 DIAGNOSIS — I10 Essential (primary) hypertension: Secondary | ICD-10-CM

## 2014-03-28 DIAGNOSIS — G8929 Other chronic pain: Secondary | ICD-10-CM

## 2014-03-28 DIAGNOSIS — E785 Hyperlipidemia, unspecified: Secondary | ICD-10-CM

## 2014-03-28 DIAGNOSIS — E1165 Type 2 diabetes mellitus with hyperglycemia: Secondary | ICD-10-CM

## 2014-03-28 DIAGNOSIS — R1031 Right lower quadrant pain: Secondary | ICD-10-CM

## 2014-03-28 LAB — GLUCOSE, CAPILLARY: GLUCOSE-CAPILLARY: 207 mg/dL — AB (ref 70–99)

## 2014-03-28 LAB — POCT GLYCOSYLATED HEMOGLOBIN (HGB A1C): Hemoglobin A1C: 10.6

## 2014-03-28 MED ORDER — INSULIN ASPART 100 UNIT/ML ~~LOC~~ SOLN: 5.0000 [IU] | Freq: Three times a day (TID) | SUBCUTANEOUS | Status: DC

## 2014-03-28 MED ORDER — AMLODIPINE BESYLATE 10 MG PO TABS: 10.0000 mg | ORAL_TABLET | Freq: Every day | ORAL | Status: DC

## 2014-03-28 MED ORDER — INSULIN GLARGINE 100 UNIT/ML SOLOSTAR PEN: 48.0000 [IU] | PEN_INJECTOR | Freq: Every day | SUBCUTANEOUS | Status: DC

## 2014-03-28 NOTE — Patient Instructions (Addendum)
General Instructions:   Please try to bring all your medicines next time. This will help [place] keep you safe from mistakes.  FOR YOUR DIABETES: -CHECK YOUR BLOOD SUGAR IN THE MORNING, BEFORE LUNCH, BEFORE DINNER, AND AT BEDTIME -TAKE LANTUS 48 units at night -TAKE NOVOLOG 5 UNITS BEFORE BREAKFAST, LUNCH AND DINNER   Diabetes y [place] fsica (Diabetes and Exercise) Hacer actividad fsica con regularidad es muy importante. No se trata solo de [employer]. Tiene muchos otros beneficios, como por ejemplo:  Mejorar el estado fsico, la flexibilidad y la resistencia.  Aumenta la densidad sea.  Ayuda a Art gallery manager.  Disminuye la Art gallery manager.  Aumenta la fuerza muscular.  Reduce el estrs y las tensiones.  Mejora el estado de salud general. Las personas diabticas que realizan actividad fsica tienen beneficios adicionales debido al ejercicio:  Reduce el apetito.  El organismo mejora el uso del azcar (glucosa) de la [place].  Ayuda a disminuir o Engineer, maintenance (IT).  Disminuye la presin arterial.  Ayuda a disminuir los lpidos en la sangre (colesterol y triglicridos).  El organismo mejora el uso de la insulina porque:  Aumenta la sensibilidad del organismo a la insulina.  Reduce las necesidades de insulina del organismo.  Disminuye el riesgo de enfermedad cardaca por la actividad fsica ya que  disminuye el colesterol y [employer] triglicridos.  Aumenta los niveles de colesterol bueno (como las lipoprotenas de alta densidad [HDL]) en el organismo.  Disminuye los niveles de glucosa en la [place]. SU PLAN DE ACTIVIDAD  Elija una actividad que disfrute y establezca objetivos realistas. Su mdico o educador en diabetes podrn ayudarlo a encontrar una actividad que lo beneficie. Haga ejercicio regularmente como se lo haya indicado el mdico. Esto incluye:  Hacer entrenamiento de [employer] a la semana, como flexiones, sentadillas,  levantar peso o usar bandas de resistencia.  Practicar de ejercicios cardiovasculares cada semana, como caminar, correr o hacer algn deporte.  Mantenerse activo y no permanecer inactivo durante ms de seguidos. Los perodos cortos de [place] tambin son beneficiosos. Tres sesiones de a lo largo del da son tan beneficiosas como una sola sesin de . Estas son algunas ideas para los ejercicios:  Lleve a Multimedia programmer.  Utilice las Microbiologist del ascensor.  Baile su cancin favorita.  Haga los ejercicios de un video de ejercicios.  Haga sus ejercicios favoritos con Leisure centre manager. RECOMENDACIONES PARA REALIZAR EJERCICIOS CUANDO SE TIENE DIABETES TIPO 1 O TIPO 2   Controle la glucosa en la sangre antes de comenzar. Si el nivel de glucosa en la sangre es de ms de 240 mg/dl, controle las cetonas en la [place]. No haga actividad fsica si hay cetonas.  Evite inyectarse insulina en las zonas del cuerpo que ejercitar. Por ejemplo, evite inyectarse insulina en:  Los brazos, si juega al tenis.  Las piernas, si corre.  Lleve un registro de:  Los alimentos que consume antes y despus de Tour manager.  Los momentos esperables de picos de accin de la insulina.  Los niveles de glucosa en la sangre antes y despus de hacer ejercicios.  El tipo y cantidad de [place] fsica que Biomedical engineer.  Revise los registros con su mdico. El mdico lo ayudar a Environmental education officer pautas para ajustar la cantidad de alimento y las cantidades de insulina antes y despus de Radio producer ejercicios.  Si toma insulina o agentes hipoglucemiantes por va oral, observe si hay signos y sntomas de  hipoglucemia. Entre los que se incluyen:  Mareos.  Temblores.  Sudoracin.  Escalofros.  Confusin.  Beba gran cantidad de agua mientras hace ejercicios para evitar la deshidratacin o los golpes de Airline pilotcalor. Durante la actividad fsica se pierde agua corporal que se debe  reponer.  Comente con su mdico antes de comenzar un programa de actividad fsica para verificar que sea seguro para usted. Recuerde, cualquier actividad es mejor que ninguna. Document Released: 01/11/2007 Document Revised: 05/08/2013 [facility] Patient Information 2015 [place]. This information is not intended to replace advice given to you by your health care provider. Make sure you discuss any questions you have with your health care provider.

## 2014-03-29 MED ORDER — METFORMIN HCL 1000 MG PO TABS: 1000.0000 mg | ORAL_TABLET | Freq: Two times a day (BID) | ORAL | Status: DC

## 2014-03-29 NOTE — Assessment & Plan Note (Signed)
Patient complains of 3 month history of RLQ that occurs about every 3 days, lasts for several seconds to a minutes and resolves on its own. The pain is sharp in nature and "deep." Nothing seems to bring the pain on or is associated with the pain. Movement makes the pain worse, but nothing makes the pain better as it resolves on its own after several seconds. Pain can be up to a 7-8/10 when it occurs. She is not currently having any pain. This pain is not associated with any fevers, chills, nausea, vomiting, diarrhea, constipation, blood in her stools, decreased appetite. It is not associated with her menstrual cycles. She has normal menstrual cycles every month, lasting for 6 days. She denies amenorrhea, dysmenorrhea, or menometrorrhagia. She denies urinary symptoms of dysuria, increased frequency or suprapubic pain. She denies vaginal symptoms of discharge or itching. She does have a prior history of a C-section. Her physical exam is unremarkable. Vital signs are normal. Given the chronicity, intermittent nature and lack of other associated symptoms, I do not believe this pain is acutely worrisome. I presented the patient with options to do an ultrasound, or to watch the pain is see if it resolves. Patient would like to watch and wait.  Plan: -Observe -If pain worsens, increases in frequency, or if she develops other signs or symptoms, she has agreed to contact the clinic and let me know.

## 2014-03-29 NOTE — Progress Notes (Signed)
Case discussed with Dr. Richardson at time of visit. We reviewed the resident's history and exam and pertinent patient test results. I agree with the assessment, diagnosis, and plan of care documented in the resident's note. 

## 2014-03-29 NOTE — Assessment & Plan Note (Signed)
Well controlled off of medication. Last lipid panel: Results for Robin Klein, Robin Klein (MRN [id number]) as of 03/29/2014 11:37  Ref. Range 09/05/2013 09:09  Cholesterol Latest Range: 0-200 mg/dL 409148  Triglycerides Latest Range: <150 mg/dL 811157 (H)  HDL Latest Range: >39 mg/dL 45  LDL (calc) Latest Range: 0-99 mg/dL 72  VLDL Latest Range: 0-40 mg/dL 31  Total CHOL/HDL Ratio Latest Units: Ratio 3.3   Patient counseled on diet and exercise. We will repeat lipid panel in September 2016 to determine if patient would benefit from medication.

## 2014-03-29 NOTE — Progress Notes (Signed)
Subjective:    Patient ID: Janeece RiggersMaria S Martinez Serrano, female    DOB: 06/13/1970, 44 y.o.   MRN: [id number]  HPI Ms. Tresa ResMartinez Serrano is a 44 yo female with PMHx of T2DM, HTN, and HLD who presents for follow up for her T2DM and acute complaint of RLQ pain. Please see problem oriented assessment and plan for more information.  Review of Systems General: Denies fever, chills, change in appetite and diaphoresis.  Respiratory: Denies SOB, cough   Cardiovascular: Denies chest pain Gastrointestinal: Admits to chronic, intermittent, sharp RLQ pain. Denies nausea, vomiting, abdominal pain, diarrhea, constipation, blood in stool, change in appetite  Genitourinary: Denies dysuria, urgency, frequency. Denies vaginal discharge or itching. Denies amenorrhea, dysmenorrhea, menometrorrhagia.  Skin: Denies rash and wounds.  Neurological: Denies dizziness, headaches, weakness  Past Medical History  Diagnosis Date  . Diabetes mellitus   . Hypertension   . Fatty liver disease, nonalcoholic   . Depression   . Sinusitis    Outpatient Encounter Prescriptions as of 03/28/2014  Medication Sig  . amLODipine (NORVASC) 10 MG tablet Take 1 tablet (10 mg total) by mouth daily.  Marland Kitchen. aspirin 81 MG tablet Take 81 mg by mouth daily.  . Blood Glucose Monitoring Suppl (WAVESENSE PRESTO PRO METER) DEVI Use to check blood sugar up to 2 times daily   . diclofenac sodium (VOLTAREN) 1 % GEL Apply 2 g topically 4 (four) times daily.  Marland Kitchen. glucose blood (WAVESENSE PRESTO TEST) test strip Use to test blood sugar up to 2 times daily   . insulin aspart (NOVOLOG) 100 UNIT/ML injection Inject 5 Units into the skin 3 (three) times daily with meals. Inject 15 minutes before breakfast lunch and dinner.  . Insulin Glargine (LANTUS SOLOSTAR) 100 UNIT/ML Solostar Pen Inject 48 Units into the skin daily at 10 pm.  . Insulin Syringe-Needle U-100 (GNP INSULIN SYRINGE) 31G X 5/16" 0.5 ML MISC Use to inject insulin once daily   . Lancets MISC Use  to check blood sugar as directed   . metFORMIN (GLUCOPHAGE) 1000 MG tablet Take 1 tablet (1,000 mg total) by mouth 2 (two) times daily with a meal.  . Prenatal Vit-Fe Fumarate-FA (PRENATAL 1 PLUS 1 PO) Take 1 tablet by mouth.    . [DISCONTINUED] amLODipine (NORVASC) 10 MG tablet Take 1 tablet (10 mg total) by mouth daily.  . [DISCONTINUED] insulin aspart (NOVOLOG) 100 UNIT/ML injection Inject 5 Units into the skin 3 (three) times daily with meals. Inject 15 minutes before breakfast lunch and dinner.  . [DISCONTINUED] Insulin Glargine (LANTUS SOLOSTAR) 100 UNIT/ML Solostar Pen Inject 40 Units into the skin daily at 10 pm.  . [DISCONTINUED] rosuvastatin (CRESTOR) 5 MG tablet Take 1 tablet (5 mg total) by mouth at bedtime.      Objective:   Physical Exam Filed Vitals:   03/28/14 1602  BP: 136/83  Pulse: 87  Temp: 98.2 F (36.8 C)  TempSrc: Oral  Height: 5' (1.524 m)  Weight: 156 lb 6.4 oz (70.943 kg)  SpO2: 99%   General: Vital signs reviewed.  Patient is well-developed and well-nourished, in no acute distress and cooperative with exam.  Cardiovascular: RRR Pulmonary/Chest: Clear to auscultation bilaterally, no wheezes, rales, or rhonchi. Abdominal: Soft, non-tender, obese, non-distended, BS +, no masses, organomegaly, or guarding present. No rash. Extremities: No lower extremity edema bilaterally Skin: Warm, dry and intact. No rashes or erythema. Psychiatric: Normal mood and affect. speech and behavior is normal.     Assessment & Plan:   Please  see problem based assessment and plan.

## 2014-03-29 NOTE — Assessment & Plan Note (Signed)
Lab Results  Component Value Date   HGBA1C 10.6 03/28/2014   HGBA1C 10.5 12/14/2013   HGBA1C 9.9 09/05/2013     Assessment: Diabetes control:  Uncontrolled Progress toward A1C goal:   Deteriorated, stagnant Comments: Patient is on Novolog 5 units TID WC and Lantus 40 units QHS and metformin 1000 mg BID at home. Patient admits to frequently missing her dinner time dose of Novolog and only checking her blood glucose in the morning. She admits to hyperglycemia in the 200-300s in the morning and denies any hypoglycemic episodes or symptoms.  Plan: Medications:  Increase Lantus to 48 units QHS. Continue Novolog 5 units TID WC and Metformin 1000 mg BID.  Home glucose monitoring: Frequency:  QID Timing:  AC/HS Instruction/counseling given: reminded to bring blood glucose meter & log to each visit, reminded to bring medications to each visit and discussed diet Educational resources provided:   Self management tools provided: copy of home glucose meter download Other plans: Encouraged patient to check her glucose more regularly and educated her on the signs and symptoms of hypoglycemia

## 2014-03-29 NOTE — Assessment & Plan Note (Signed)
BP Readings from Last 3 Encounters:  03/28/14 136/83  12/21/13 116/81  12/14/13 126/85    Lab Results  Component Value Date   NA 136 12/14/2013   K 3.7 12/14/2013   CREATININE 0.58 12/14/2013    Assessment: Blood pressure control:   Controlled Progress toward BP goal:   At goal (<140/90) Comments: Compliant with amlodipine 10 mg daily  Plan: Medications:  continue current medications

## 2014-04-23 ENCOUNTER — Emergency Department (HOSPITAL_COMMUNITY)
Admission: EM | Admit: 2014-04-23 | Discharge: 2014-04-23 | Disposition: A | Discharge status: Home / Self Care | Payer: Self-pay | Source: Home / Self Care

## 2014-04-27 ENCOUNTER — Telehealth: Payer: Self-pay | Admitting: *Deleted

## 2014-04-27 NOTE — Telephone Encounter (Signed)
Pt here at the clinic - stated she had lost or misplaced Lantus rx written on 3/23; need another rx [facility]. [facility] pt informed.

## 2014-06-26 ENCOUNTER — Telehealth: Payer: Self-pay | Admitting: Internal Medicine

## 2014-06-26 NOTE — Telephone Encounter (Signed)
Call to patient to confirm appointment f or 06/27/14 at 3:45 lmtcb

## 2014-06-27 ENCOUNTER — Encounter: Payer: Self-pay | Admitting: Internal Medicine

## 2014-06-27 ENCOUNTER — Ambulatory Visit (INDEPENDENT_AMBULATORY_CARE_PROVIDER_SITE_OTHER): Payer: Self-pay | Admitting: Internal Medicine

## 2014-06-27 VITALS — BP 114/86 | HR 82 | Temp 98.0°F | Ht 60.0 in | Wt 158.6 lb

## 2014-06-27 DIAGNOSIS — Z794 Long term (current) use of insulin: Secondary | ICD-10-CM

## 2014-06-27 DIAGNOSIS — I10 Essential (primary) hypertension: Secondary | ICD-10-CM

## 2014-06-27 DIAGNOSIS — IMO0002 Reserved for concepts with insufficient information to code with codable children: Secondary | ICD-10-CM

## 2014-06-27 DIAGNOSIS — Z0000 Encounter for general adult medical examination without abnormal findings: Secondary | ICD-10-CM | POA: Insufficient documentation

## 2014-06-27 DIAGNOSIS — E1165 Type 2 diabetes mellitus with hyperglycemia: Secondary | ICD-10-CM

## 2014-06-27 DIAGNOSIS — Z9114 Patient's other noncompliance with medication regimen: Secondary | ICD-10-CM

## 2014-06-27 DIAGNOSIS — E785 Hyperlipidemia, unspecified: Secondary | ICD-10-CM

## 2014-06-27 LAB — GLUCOSE, CAPILLARY: Glucose-Capillary: 210 mg/dL — ABNORMAL HIGH (ref 65–99)

## 2014-06-27 LAB — POCT GLYCOSYLATED HEMOGLOBIN (HGB A1C): Hemoglobin A1C: 10.7

## 2014-06-27 MED ORDER — INSULIN GLARGINE 100 UNIT/ML SOLOSTAR PEN: 48.0000 [IU] | PEN_INJECTOR | Freq: Every day | SUBCUTANEOUS | Status: DC

## 2014-06-27 MED ORDER — INSULIN ASPART 100 UNIT/ML ~~LOC~~ SOLN: 5.0000 [IU] | Freq: Three times a day (TID) | SUBCUTANEOUS | Status: DC

## 2014-06-27 MED ORDER — METFORMIN HCL 1000 MG PO TABS: 1000.0000 mg | ORAL_TABLET | Freq: Two times a day (BID) | ORAL | Status: DC

## 2014-06-27 MED ORDER — AMLODIPINE BESYLATE 10 MG PO TABS: 10.0000 mg | ORAL_TABLET | Freq: Every day | ORAL | Status: DC

## 2014-06-27 NOTE — Progress Notes (Signed)
   Subjective:    Patient ID: Robin Klein, female    DOB: 28-Sep-1970, 44 y.o.   MRN: [id number]  HPI [name] is a 44 yo female with PMHx of T2DM, HTN, and HLD who presents for follow up for her T2DM. Please see problem based assessment and plan for more information.  Review of Systems General: Denies fatigue, change in appetite  Respiratory: Denies SOB, DOE, chest tightness Cardiovascular: Denies chest pain and palpitations.  Gastrointestinal: Denies abdominal pain, diarrhea, constipation Skin: Denies rash and wounds.  Neurological: Denies dizziness, weakness, lightheadedness, numbness  Past Medical History  Diagnosis Date  . Diabetes mellitus   . Hypertension   . Fatty liver disease, nonalcoholic   . Depression   . Sinusitis    Outpatient Encounter Prescriptions as of 06/27/2014  Medication Sig  . amLODipine (NORVASC) 10 MG tablet Take 1 tablet (10 mg total) by mouth daily.  Marland Kitchen aspirin 81 MG tablet Take 81 mg by mouth daily.  . Blood Glucose Monitoring Suppl (WAVESENSE PRESTO PRO METER) DEVI Use to check blood sugar up to 2 times daily   . diclofenac sodium (VOLTAREN) 1 % GEL Apply 2 g topically 4 (four) times daily.  Marland Kitchen glucose blood (WAVESENSE PRESTO TEST) test strip Use to test blood sugar up to 2 times daily   . insulin aspart (NOVOLOG) 100 UNIT/ML injection Inject 5 Units into the skin 3 (three) times daily with meals. Inject 15 minutes before breakfast lunch and dinner.  . Insulin Glargine (LANTUS SOLOSTAR) 100 UNIT/ML Solostar Pen Inject 48 Units into the skin daily at 10 pm.  . Insulin Syringe-Needle U-100 (GNP INSULIN SYRINGE) 31G X 5/16" 0.5 ML MISC Use to inject insulin once daily   . Lancets MISC Use to check blood sugar as directed   . metFORMIN (GLUCOPHAGE) 1000 MG tablet Take 1 tablet (1,000 mg total) by mouth 2 (two) times daily with a meal.  . Prenatal Vit-Fe Fumarate-FA (PRENATAL 1 PLUS 1 PO) Take 1 tablet by mouth.     No  facility-administered encounter medications on file as of 06/27/2014.      Objective:   Physical Exam Filed Vitals:   06/27/14 1551  BP: 114/86  Pulse: 82  Temp: 98 F (36.7 C)  TempSrc: Oral  Height: 5' (1.524 m)  Weight: 158 lb 9.6 oz (71.94 kg)  SpO2: 100%   General: Vital signs reviewed.  Patient is well-developed and well-nourished, in no acute distress and cooperative with exam.  Cardiovascular: RRR, S1 normal, S2 normal, no murmurs, gallops, or rubs. Pulmonary/Chest: Clear to auscultation bilaterally, no wheezes, rales, or rhonchi. Abdominal: Soft, non-tender, non-distended, BS + Extremities: No lower extremity edema bilaterally, pulses symmetric and intact bilaterally. Skin: Warm, dry and intact. No rashes or erythema. Psychiatric: Normal mood and affect. speech and behavior is normal. Cognition and memory are normal.     Assessment & Plan:   Please see problem oriented assessment and plan.

## 2014-06-27 NOTE — Assessment & Plan Note (Addendum)
Due for pap smear. Will obtain at next visit in one month.

## 2014-06-27 NOTE — Assessment & Plan Note (Addendum)
Lab Results  Component Value Date   HGBA1C 10.7 06/27/2014   HGBA1C 10.6 03/28/2014   HGBA1C 10.5 12/14/2013     Assessment: Diabetes control:  Uncontrolled Progress toward A1C goal:   Deteriorated/unchanged Comments: Patient is on Lantus 48 units QHS, Novolog 5 units TID, Metformin 1000 mg BID. She admits to be non-compliant with her novolog and only taking it once or twice a day if she remember. Patient forgot her meter today, but states the lowest she has seen in 119, the highest 396. Unfortunately, I think [name] put everyone else before her self, which in turn makes it hard to take care of herself. We talked strategies for dieting even while cooking for her family and for exercising with her kids. Patient was educated that even if she forgets her novolog and remember during her meal, she can stop and go ahead and take her Novolog.   Plan: Medications:  continue current medications Home glucose monitoring: Frequency:  TID Timing:  AM, lunch, QHS Instruction/counseling given: reminded to bring blood glucose meter & log to each visit and discussed diet  Other plans: I will not make any increases her Novolog today as first I want to stress compliance. Patient will follow up in one month. At that time, based on her glucose,  we can make changes.

## 2014-06-27 NOTE — Patient Instructions (Signed)
General Instructions:   Please bring your medicines with you each time you come to clinic.  Medicines may include prescription medications, over-the-counter medications, herbal remedies, eye drops, vitamins, or other pills.   CONTINUE ALL MEDICATION THE SAME BUT PLEASE REMEMBER TO TAKE NOVOLOG 5 UNITS WITH BREAKFAST, LUNCH AND DINNER EVERY DAY! PLEASE FOLLOW UP IN ONE MONTH. REMEMBER TO EAT LESS CARBS (TORTILLAS, RICE) AND MORE MEAT AND VEGETABLES.   La diabetes mellitus y los alimentos (Diabetes Mellitus and Food) Es importante que controle su nivel de azcar en la sangre (glucosa). El nivel de glucosa en sangre depende en gran medida de lo que usted come. Comer alimentos saludables en las cantidades [place] a lo largo del Futures trader, aproximadamente a la misma hora [employer], lo ayudar a Chief Operating Officer su nivel de Event organiser. Tambin puede ayudarlo a retrasar o Fish farm manager de la diabetes mellitus. [place] de [employer] saludable incluso puede ayudarlo a Event organiser de presin arterial y a Barista o Pharmacologist un peso saludable.  CMO PUEDEN AFECTARME LOS ALIMENTOS? Carbohidratos Los carbohidratos afectan el nivel de glucosa en sangre ms que cualquier otro tipo de alimento. El nutricionista lo ayudar a Chief Strategy Officer cuntos carbohidratos puede consumir en cada comida y ensearle a contarlos. El recuento de carbohidratos es importante para mantener la glucosa en sangre en un nivel saludable, en especial si utiliza insulina o toma determinados medicamentos para la diabetes mellitus. Alcohol El alcohol puede provocar disminuciones sbitas de la glucosa en sangre (hipoglucemia), en especial si utiliza insulina o toma determinados medicamentos para la diabetes mellitus. La hipoglucemia es una afeccin que puede poner en peligro la vida. Los sntomas de la hipoglucemia (somnolencia, mareos y Administrator) son similares a los sntomas de haber consumido mucho alcohol.  Si el mdico lo  autoriza a beber alcohol, hgalo con moderacin y siga estas pautas:  Las mujeres no deben beber ms de un trago por da, y los hombres no deben beber ms de dos tragos por Futures trader. Un trago es igual a:  12 onzas (355 ml) de cerveza  5 onzas de vino (150 ml) de vino  1,5onzas (45ml) de bebidas espirituosas  No beba con el estmago vaco.  Mantngase hidratado. Beba agua, gaseosas dietticas o t helado sin azcar.  Las gaseosas comunes, los jugos y otros refrescos podran contener muchos carbohidratos y se Heritage manager. QU ALIMENTOS NO SE RECOMIENDAN? Cuando haga las elecciones de alimentos, es importante que recuerde que todos los alimentos son distintos. Algunos tienen menos nutrientes que otros por porcin, aunque podran tener la misma cantidad de caloras o carbohidratos. Es difcil darle al cuerpo lo que necesita cuando consume alimentos con menos nutrientes. Estos son algunos ejemplos de alimentos que debera evitar ya que contienen muchas caloras y carbohidratos, pero pocos nutrientes:  Neurosurgeon trans (la mayora de los alimentos procesados incluyen grasas trans en la etiqueta de Informacin nutricional).  Gaseosas comunes.  Jugos.  Caramelos.  Dulces, como tortas, pasteles, rosquillas y [place].  Comidas fritas. QU ALIMENTOS PUEDO COMER? Consuma alimentos ricos en nutrientes, que nutrirn el cuerpo y lo mantendrn saludable. Los alimentos que debe comer tambin dependern de varios factores, como:  Las caloras que necesita.  Los medicamentos que toma.  Su peso.  El nivel de glucosa en [place].  El [place] de presin arterial.  El nivel de colesterol. Tambin debe consumir una variedad de [place], como:  Protenas, como carne, aves, pescado, tofu, frutos secos y semillas (las protenas de [place] magros son mejores).  Nils Pyle.  Verduras.  Productos lcteos, como [place], queso y yogur (descremados son mejores).  Panes, granos, pastas, cereales, arroz y  frijoles.  Grasas, como aceite de [place], [place] sin grasas trans, aceite de canola, aguacate y [place]. TODOS LOS QUE PADECEN DIABETES MELLITUS TIENEN EL MISMO PLAN DE COMIDAS? Dado que todas las personas que padecen diabetes mellitus son distintas, no hay un solo plan de comidas que funcione para todos. Es muy importante que se rena con un nutricionista que lo ayudar a crear un plan de comidas adecuado para usted. Document Released: 03/31/2007 Document Revised: 12/27/2012 [facility] Patient Information 2015 [place], [place]. This information is not intended to replace advice given to you by your health care provider. Make sure you discuss any questions you have with your health care provider.

## 2014-06-27 NOTE — Assessment & Plan Note (Addendum)
BP Readings from Last 3 Encounters:  06/27/14 114/86  03/28/14 136/83  12/21/13 116/81    Lab Results  Component Value Date   NA 136 12/14/2013   K 3.7 12/14/2013   CREATININE 0.58 12/14/2013    Assessment: Blood pressure control:  Controlled Progress toward BP goal:   At goal Comments: Patient has been compliant with amlodipine 10 mg daily.   Plan: Medications:  continue current medications Other plans: Consider adding ARB based on urine microalbumin/creatinine ratio, allergy to ACEI.

## 2014-06-27 NOTE — Assessment & Plan Note (Signed)
Trial of diet and exercise to control hyperlipidemia that is just above goal. Will recheck lipid panel in September and recommend medication based on results.

## 2014-06-28 LAB — MICROALBUMIN / CREATININE URINE RATIO
CREATININE, URINE: 299.2 mg/dL
MICROALB UR: 9.5 mg/dL — AB (ref <2.0)
Microalb Creat Ratio: 31.8 mg/g — ABNORMAL HIGH (ref 0.0–30.0)

## 2014-06-29 NOTE — Progress Notes (Signed)
INTERNAL MEDICINE TEACHING ATTENDING ADDENDUM - Sheniece Ruggles, MD: I reviewed and discussed at the time of visit with the resident Dr. Richardson, the patient's medical history, physical examination, diagnosis and results of pertinent tests and treatment and I agree with the patient's care as documented.  

## 2014-07-24 ENCOUNTER — Ambulatory Visit: Payer: Self-pay

## 2014-07-25 ENCOUNTER — Ambulatory Visit: Payer: Self-pay

## 2014-07-25 ENCOUNTER — Encounter: Payer: Self-pay | Admitting: Internal Medicine

## 2014-07-25 ENCOUNTER — Ambulatory Visit (INDEPENDENT_AMBULATORY_CARE_PROVIDER_SITE_OTHER): Payer: Self-pay | Admitting: Internal Medicine

## 2014-07-25 VITALS — BP 126/78 | HR 78 | Temp 98.3°F | Wt 157.9 lb

## 2014-07-25 DIAGNOSIS — I10 Essential (primary) hypertension: Secondary | ICD-10-CM

## 2014-07-25 DIAGNOSIS — Z0000 Encounter for general adult medical examination without abnormal findings: Secondary | ICD-10-CM

## 2014-07-25 DIAGNOSIS — IMO0002 Reserved for concepts with insufficient information to code with codable children: Secondary | ICD-10-CM

## 2014-07-25 DIAGNOSIS — B373 Candidiasis of vulva and vagina: Secondary | ICD-10-CM

## 2014-07-25 DIAGNOSIS — Z79899 Other long term (current) drug therapy: Secondary | ICD-10-CM

## 2014-07-25 DIAGNOSIS — B3731 Acute candidiasis of vulva and vagina: Secondary | ICD-10-CM

## 2014-07-25 DIAGNOSIS — E1165 Type 2 diabetes mellitus with hyperglycemia: Secondary | ICD-10-CM

## 2014-07-25 DIAGNOSIS — Z794 Long term (current) use of insulin: Secondary | ICD-10-CM

## 2014-07-25 DIAGNOSIS — K121 Other forms of stomatitis: Secondary | ICD-10-CM

## 2014-07-25 LAB — GLUCOSE, CAPILLARY: Glucose-Capillary: 291 mg/dL — ABNORMAL HIGH (ref 65–99)

## 2014-07-25 MED ORDER — INSULIN ASPART 100 UNIT/ML ~~LOC~~ SOLN: 8.0000 [IU] | Freq: Three times a day (TID) | SUBCUTANEOUS | Status: DC

## 2014-07-25 MED ORDER — FLUCONAZOLE 150 MG PO TABS: 150.0000 mg | ORAL_TABLET | Freq: Once | ORAL | Status: DC

## 2014-07-25 MED ORDER — LOSARTAN POTASSIUM 25 MG PO TABS: 25.0000 mg | ORAL_TABLET | Freq: Every day | ORAL | Status: DC

## 2014-07-25 MED ORDER — AMLODIPINE BESYLATE 5 MG PO TABS: 5.0000 mg | ORAL_TABLET | Freq: Every day | ORAL | Status: DC

## 2014-07-25 MED ORDER — INSULIN GLARGINE 100 UNIT/ML SOLOSTAR PEN: 35.0000 [IU] | PEN_INJECTOR | Freq: Every day | SUBCUTANEOUS | Status: DC

## 2014-07-25 NOTE — Patient Instructions (Signed)
General Instructions:   Please bring your medicines with you each time you come to clinic.  Medicines may include prescription medications, over-the-counter medications, herbal remedies, eye drops, vitamins, or other pills.   FOR YOUR DIABETES: -TAKE LANTUS 35 UNITS AT NIGHT -TAKE NOVOLOG 8 UNITS BEFORE OR DURING EACH MEAL (3 TIMES A DAY) -KEEP CHECKING YOUR BLOOD SUGAR IN THE MORNING AND BEFORE LUNCH AND DINNER AND AT BEDTIME  FOR YOUR HIGH BLOOD PRESSURE: -TAKE AMLODIPINE 5 MG A DAY -TAKE LOSARTAN 25 MG A DAY  RETURN IN 2-4 WEEKS

## 2014-07-25 NOTE — Progress Notes (Signed)
Medicine attending: Medical history, presenting problems, physical findings, and medications, reviewed with Dr Alexa Richardson and I concur with her evaluation and management plan. 

## 2014-07-25 NOTE — Progress Notes (Signed)
Subjective:    Patient ID: Robin Klein, female    DOB: 08/20/1970, 44 y.o.   MRN: [id number]  HPI [name] is a 44 yo female with PMHx of T2DM, HTN, and HLD who presents for follow up for T2DM and for a pap smear. Please see problem oriented assessment and plan for more information.  Review of Systems General: Denies fever, chills, fatigue HEENT: admits to chronic mouth ulcers Respiratory: Denies SOB, cough.   Cardiovascular: Denies chest pain and palpitations.  Gastrointestinal: Denies nausea, vomiting, abdominal pain, diarrhea Genitourinary: Admits to vaginal itching and discharge. Denies dysuria, urgency, frequency, suprapubic pain and flank pain. Neurological: Denies dizziness, headaches, weakness, lightheadedness  Past Medical History  Diagnosis Date  . Diabetes mellitus   . Hypertension   . Fatty liver disease, nonalcoholic   . Depression   . Sinusitis    Outpatient Encounter Prescriptions as of 07/25/2014  Medication Sig  . amLODipine (NORVASC) 5 MG tablet Take 1 tablet (5 mg total) by mouth daily.  Marland Kitchen. aspirin 81 MG tablet Take 81 mg by mouth daily.  . Blood Glucose Monitoring Suppl (WAVESENSE PRESTO PRO METER) DEVI Use to check blood sugar up to 2 times daily   . diclofenac sodium (VOLTAREN) 1 % GEL Apply 2 g topically 4 (four) times daily.  . fluconazole (DIFLUCAN) 150 MG tablet Take 1 tablet (150 mg total) by mouth once.  Marland Kitchen. glucose blood (WAVESENSE PRESTO TEST) test strip Use to test blood sugar up to 2 times daily   . insulin aspart (NOVOLOG) 100 UNIT/ML injection Inject 8 Units into the skin 3 (three) times daily with meals. Inject 15 minutes before breakfast lunch and dinner.  . Insulin Glargine (LANTUS SOLOSTAR) 100 UNIT/ML Solostar Pen Inject 35 Units into the skin daily at 10 pm.  . Insulin Syringe-Needle U-100 (GNP INSULIN SYRINGE) 31G X 5/16" 0.5 ML MISC Use to inject insulin once daily   . Lancets MISC Use to check blood sugar as directed    . losartan (COZAAR) 25 MG tablet Take 1 tablet (25 mg total) by mouth daily.  . metFORMIN (GLUCOPHAGE) 1000 MG tablet Take 1 tablet (1,000 mg total) by mouth 2 (two) times daily with a meal.  . Prenatal Vit-Fe Fumarate-FA (PRENATAL 1 PLUS 1 PO) Take 1 tablet by mouth.    . [DISCONTINUED] amLODipine (NORVASC) 10 MG tablet Take 1 tablet (10 mg total) by mouth daily.  . [DISCONTINUED] insulin aspart (NOVOLOG) 100 UNIT/ML injection Inject 5 Units into the skin 3 (three) times daily with meals. Inject 15 minutes before breakfast lunch and dinner.  . [DISCONTINUED] Insulin Glargine (LANTUS SOLOSTAR) 100 UNIT/ML Solostar Pen Inject 48 Units into the skin daily at 10 pm.   No facility-administered encounter medications on file as of 07/25/2014.      Objective:   Physical Exam Filed Vitals:   07/25/14 1358  BP: 126/78  Pulse: 78  Temp: 98.3 F (36.8 C)  TempSrc: Oral  Weight: 157 lb 14.4 oz (71.623 kg)  SpO2: 100%   General: Vital signs reviewed.  Patient is well-developed and well-nourished, in no acute distress and cooperative with exam.  HEENT: Small white plaque on buccal mucosa inside right cheek.  Cardiovascular: RRR, S1 normal, S2 normal, no murmurs, gallops, or rubs. Pulmonary/Chest: Clear to auscultation bilaterally, no wheezes, rales, or rhonchi. Abdominal: Soft, non-tender, non-distended, BS + Extremities: No lower extremity edema bilaterally, pulses symmetric and intact bilaterally. Skin: Warm, dry and intact. No rashes or erythema. Psychiatric: Normal  mood and affect. speech and behavior is normal. Cognition and memory are normal.  Pelvic exam: VULVA: normal appearing vulva with no masses, tenderness or lesions, VAGINA: normal appearing vagina with normal color and discharge, no lesions, vaginal discharge - white and curd-like, CERVIX: normal appearing cervix without lesions, PAP: Pap smear done today, exam chaperoned by Rivka Barbara, RN.     Assessment & Plan:   Please see problem  based assessment and plan for more information.

## 2014-07-26 DIAGNOSIS — K121 Other forms of stomatitis: Secondary | ICD-10-CM | POA: Insufficient documentation

## 2014-07-26 LAB — CYTOLOGY - PAP

## 2014-07-26 NOTE — Assessment & Plan Note (Signed)
Performed pap smear today

## 2014-07-26 NOTE — Assessment & Plan Note (Signed)
Patient complains of mouth ulcers that she has had for 1.5 years. These have been addressed with her previous PCP and her dentist without being able to diagnose her. She, however, has never mentioned them before to me and I have seen her several times in the last year. Patient states she gets them on her lips, gums, inside mouth. They can be white or purple-black. She denies any trauma or bleeding. They are mildly painful. On physical exam, she has a small white plaque on buccal mucosa inside right cheek.   Plan: -I will need to do some chart review and literature review to better address this issue -Follow up in 2 weeks.

## 2014-07-26 NOTE — Assessment & Plan Note (Signed)
BP Readings from Last 3 Encounters:  07/25/14 126/78  06/27/14 114/86  03/28/14 136/83    Lab Results  Component Value Date   NA 136 12/14/2013   K 3.7 12/14/2013   CREATININE 0.58 12/14/2013    Assessment: Blood pressure control:  Controlled Progress toward BP goal:   At goal Comments: Would like to add ARB for proteinuria. Allergy to ACEI  Plan: Medications:  Add losartan 25 mg daily. Decrease amlodipine to 5 mg daily. Educational resources provided:   Self management tools provided:   Other plans: May take amlodipine off in the future and increase losartan if tolerated.

## 2014-07-26 NOTE — Assessment & Plan Note (Signed)
Lab Results  Component Value Date   HGBA1C 10.7 06/27/2014   HGBA1C 10.6 03/28/2014   HGBA1C 10.5 12/14/2013     Assessment: Diabetes control:  Uncontrolled Progress toward A1C goal:   Stagnant Comments: Fasting CBGs in the 250-300s. High throughout the day. No hypoglycemic events. On Lantus 48 units QHS and Novolog 5 units TID WC. On Met 1000 mg BID.  Plan: Medications:  Based on TDD, max basal is 35 units of Lantus. Change Lantus to 35 units QHS, increase Novolog to 8 units TID WC. Continue Metformin 1000 mg BID.  Home glucose monitoring: Frequency:  QID Timing:  AC/HS Instruction/counseling given: discussed diet Educational resources provided:   Self management tools provided:   Other plans: Return in 2 weeks.

## 2014-08-14 ENCOUNTER — Telehealth: Payer: Self-pay | Admitting: Internal Medicine

## 2014-08-14 NOTE — Telephone Encounter (Signed)
Call to patient to confirm appointment for 08/15/14 at 3:15 lmtcb

## 2014-08-15 ENCOUNTER — Ambulatory Visit (INDEPENDENT_AMBULATORY_CARE_PROVIDER_SITE_OTHER): Payer: Self-pay | Admitting: Internal Medicine

## 2014-08-15 ENCOUNTER — Encounter: Payer: Self-pay | Admitting: Internal Medicine

## 2014-08-15 VITALS — BP 117/77 | HR 86 | Temp 98.0°F | Ht 60.0 in | Wt 157.6 lb

## 2014-08-15 DIAGNOSIS — Z0000 Encounter for general adult medical examination without abnormal findings: Secondary | ICD-10-CM

## 2014-08-15 DIAGNOSIS — B3731 Acute candidiasis of vulva and vagina: Secondary | ICD-10-CM

## 2014-08-15 DIAGNOSIS — I10 Essential (primary) hypertension: Secondary | ICD-10-CM

## 2014-08-15 DIAGNOSIS — K121 Other forms of stomatitis: Secondary | ICD-10-CM

## 2014-08-15 DIAGNOSIS — IMO0002 Reserved for concepts with insufficient information to code with codable children: Secondary | ICD-10-CM

## 2014-08-15 DIAGNOSIS — B373 Candidiasis of vulva and vagina: Secondary | ICD-10-CM

## 2014-08-15 DIAGNOSIS — E1165 Type 2 diabetes mellitus with hyperglycemia: Secondary | ICD-10-CM

## 2014-08-15 LAB — GLUCOSE, CAPILLARY: GLUCOSE-CAPILLARY: 273 mg/dL — AB (ref 65–99)

## 2014-08-15 MED ORDER — FLUCONAZOLE 150 MG PO TABS: 150.0000 mg | ORAL_TABLET | Freq: Every day | ORAL | Status: DC

## 2014-08-15 MED ORDER — INSULIN ASPART 100 UNIT/ML ~~LOC~~ SOLN: 11.0000 [IU] | Freq: Three times a day (TID) | SUBCUTANEOUS | Status: DC

## 2014-08-15 MED ORDER — AMLODIPINE BESYLATE 10 MG PO TABS: 10.0000 mg | ORAL_TABLET | Freq: Every day | ORAL | Status: DC

## 2014-08-15 NOTE — Assessment & Plan Note (Addendum)
Pap smear normal on 07/2014. Repeat 07/2017.

## 2014-08-15 NOTE — Patient Instructions (Addendum)
General Instructions:   Please bring your medicines with you each time you come to clinic.  Medicines may include prescription medications, over-the-counter medications, herbal remedies, eye drops, vitamins, or other pills.   FOR YOUR BLOOD PRESSURE: -STOP TAKING LOSARTAN 25 MG DAILY -KEEP TAKING AMLODIPINE 10 MG DAILY  FOR YOUR DIABETES: -KEEP TAKING LANTUS 35 UNITS AT NIGHT -TAKE NOVOLOG 11 UNITS THREE TIMES A DAY -KEEP TAKING METFORMIN 1000 MG TWICE A DAY

## 2014-08-15 NOTE — Assessment & Plan Note (Signed)
See previous note for description of history. Currently patient only has one 0.5 cm well circumscribed white plaque on her right buccal mucosa that does not scrape off. It is not painful. She does have a history of thrush (HIV and Hepatitis panels negative), but does not currently have this condition. DDx includes leukoplakia, white sponge nevus.   Plan: -Continue to monitor -Consider referral to oral surgeon if new lesions develop or persist

## 2014-08-15 NOTE — Progress Notes (Signed)
Subjective:    Patient ID: Robin Klein, female    DOB: 07/05/1970, 44 y.o.   MRN: [id number]  HPI Robin Klein is a 44 yo female with PMHx of T2DM and HTN who presents for follow up for her T2DM. Please see problem oriented assessment and plan for more information.  Review of Systems General: Denies fever, chills, fatigue HEENT: Admits to mouth lesions Respiratory: Denies SOB, cough   Cardiovascular: Denies chest pain and palpitations.  Gastrointestinal: Denies nausea, vomiting, abdominal pain, diarrhea, constipation Genitourinary: Denies dysuria, urgency GU: Admits to white discharge and itching.  Skin: Denies pallor, rash and wounds.  Neurological: Denies dizziness, headaches, weakness, lightheadedness  Past Medical History  Diagnosis Date  . Diabetes mellitus   . Hypertension   . Fatty liver disease, nonalcoholic   . Depression   . Sinusitis    Outpatient Encounter Prescriptions as of 08/15/2014  Medication Sig  . amLODipine (NORVASC) 10 MG tablet Take 1 tablet (10 mg total) by mouth daily.  Marland Kitchen aspirin 81 MG tablet Take 81 mg by mouth daily.  . Blood Glucose Monitoring Suppl (WAVESENSE PRESTO PRO METER) DEVI Use to check blood sugar up to 2 times daily   . diclofenac sodium (VOLTAREN) 1 % GEL Apply 2 g topically 4 (four) times daily.  . fluconazole (DIFLUCAN) 150 MG tablet Take 1 tablet (150 mg total) by mouth daily. Take 1 pill every 3 days for 3 doses  . glucose blood (WAVESENSE PRESTO TEST) test strip Use to test blood sugar up to 2 times daily   . insulin aspart (NOVOLOG) 100 UNIT/ML injection Inject 11 Units into the skin 3 (three) times daily with meals. Inject 15 minutes before breakfast lunch and dinner.  . Insulin Glargine (LANTUS SOLOSTAR) 100 UNIT/ML Solostar Pen Inject 35 Units into the skin daily at 10 pm.  . Insulin Syringe-Needle U-100 (GNP INSULIN SYRINGE) 31G X 5/16" 0.5 ML MISC Use to inject insulin once daily   . Lancets MISC Use to check  blood sugar as directed   . metFORMIN (GLUCOPHAGE) 1000 MG tablet Take 1 tablet (1,000 mg total) by mouth 2 (two) times daily with a meal.  . Prenatal Vit-Fe Fumarate-FA (PRENATAL 1 PLUS 1 PO) Take 1 tablet by mouth.    . [DISCONTINUED] amLODipine (NORVASC) 5 MG tablet Take 1 tablet (5 mg total) by mouth daily.  . [DISCONTINUED] fluconazole (DIFLUCAN) 150 MG tablet Take 1 tablet (150 mg total) by mouth once.  . [DISCONTINUED] fluconazole (DIFLUCAN) 150 MG tablet Take 1 tablet (150 mg total) by mouth daily. Take 1 pill every 3 days for 3 doses  . [DISCONTINUED] insulin aspart (NOVOLOG) 100 UNIT/ML injection Inject 8 Units into the skin 3 (three) times daily with meals. Inject 15 minutes before breakfast lunch and dinner.  . [DISCONTINUED] losartan (COZAAR) 25 MG tablet Take 1 tablet (25 mg total) by mouth daily.   No facility-administered encounter medications on file as of 08/15/2014.       Objective:   Physical Exam Filed Vitals:   08/15/14 1543  BP: 117/77  Pulse: 86  Temp: 98 F (36.7 C)  TempSrc: Oral  Height: 5' (1.524 m)  Weight: 157 lb 9.6 oz (71.487 kg)  SpO2: 99%   General: Vital signs reviewed.  Patient is well-developed and well-nourished, in no acute distress and cooperative with exam.  HEENT: 0.5 cm white plaque on right buccal mucosa that does not scrape off.  Cardiovascular: RRR, S1 normal, S2 normal, no murmurs, gallops,  or rubs. Pulmonary/Chest: Clear to auscultation bilaterally, no wheezes, rales, or rhonchi. Abdominal: Soft, non-tender, non-distended, BS + Extremities: No lower extremity edema bilaterally,  pulses symmetric and intact bilaterally. No cyanosis or clubbing. Neurological: A&O x3 Skin: Warm, dry and intact. No rashes or erythema. Psychiatric: Normal mood and affect. speech and behavior is normal. Cognition and memory are normal.     Assessment & Plan:   Please see problem oriented assessment and plan.

## 2014-08-15 NOTE — Assessment & Plan Note (Signed)
Assessment: Patient complains of vaginal itching and white discharge that improved after last treatment with fluconazole 150 mg once, but symptoms returned 3 days later. 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 Patient requires extended course of fluconazole.   Plan: Fluconazole 150 mg po one pill Q72H x 3.

## 2014-08-15 NOTE — Assessment & Plan Note (Addendum)
BP Readings from Last 3 Encounters:  08/15/14 117/77  07/25/14 126/78  06/27/14 114/86    Lab Results  Component Value Date   NA 136 12/14/2013   K 3.7 12/14/2013   CREATININE 0.58 12/14/2013    Assessment: Blood pressure control:  Well controlled Progress toward BP goal:   At goal Comments: On amlodipine 5 mg daily and losartan 25 mg daily. Patient developed nausea and vomiting and itching with use of losartan. She stopped medication on her own and started amlodipine 10 mg daily again.   Plan: Medications: Continue amlodipine 10 mg daily. Stop losartan.

## 2014-08-15 NOTE — Assessment & Plan Note (Addendum)
Lab Results  Component Value Date   HGBA1C 10.7 06/27/2014   HGBA1C 10.6 03/28/2014   HGBA1C 10.5 12/14/2013     Assessment: Diabetes control:  Uncontrolled Progress toward A1C goal:   Not improved Comments: On lantus 35 units QHS, novolog 8 TID and metformin 1000 bid  Plan: Medications:  Increase Novolog to 11 units TID. Continue metformin and lantus as above.  Home glucose monitoring: Frequency:   TID Timing:   AC/HS Instruction/counseling given: reminded to bring blood glucose meter & log to each visit and discussed the need for weight loss Other plans: Return in one month

## 2014-08-16 NOTE — Progress Notes (Signed)
Internal Medicine Clinic Attending  I saw and evaluated the patient.  I personally confirmed the key portions of the history and exam documented by Dr. Richardson and I reviewed pertinent patient test results.  The assessment, diagnosis, and plan were formulated together and I agree with the documentation in the resident's note. 

## 2014-09-26 ENCOUNTER — Ambulatory Visit (INDEPENDENT_AMBULATORY_CARE_PROVIDER_SITE_OTHER): Payer: Self-pay | Admitting: Internal Medicine

## 2014-09-26 ENCOUNTER — Encounter: Payer: Self-pay | Admitting: Internal Medicine

## 2014-09-26 VITALS — BP 124/78 | HR 80 | Temp 97.7°F | Wt 156.0 lb

## 2014-09-26 DIAGNOSIS — J069 Acute upper respiratory infection, unspecified: Secondary | ICD-10-CM | POA: Insufficient documentation

## 2014-09-26 DIAGNOSIS — Z794 Long term (current) use of insulin: Secondary | ICD-10-CM

## 2014-09-26 DIAGNOSIS — E1165 Type 2 diabetes mellitus with hyperglycemia: Secondary | ICD-10-CM

## 2014-09-26 DIAGNOSIS — IMO0002 Reserved for concepts with insufficient information to code with codable children: Secondary | ICD-10-CM

## 2014-09-26 LAB — GLUCOSE, CAPILLARY: GLUCOSE-CAPILLARY: 210 mg/dL — AB (ref 65–99)

## 2014-09-26 LAB — POCT GLYCOSYLATED HEMOGLOBIN (HGB A1C): Hemoglobin A1C: 10.7

## 2014-09-26 MED ORDER — FLUTICASONE PROPIONATE 50 MCG/ACT NA SUSP: 2.0000 | Freq: Every day | NASAL | Status: DC

## 2014-09-26 MED ORDER — ANTIPYRINE-BENZOCAINE 5.4-1.4 % OT SOLN: 3.0000 [drp] | OTIC | Status: DC | PRN

## 2014-09-26 MED ORDER — INSULIN GLARGINE 100 UNIT/ML SOLOSTAR PEN: 38.0000 [IU] | PEN_INJECTOR | Freq: Every day | SUBCUTANEOUS | Status: DC

## 2014-09-26 NOTE — Assessment & Plan Note (Signed)
Lab Results  Component Value Date   HGBA1C 10.7 09/26/2014   HGBA1C 10.7 06/27/2014   HGBA1C 10.6 03/28/2014     Assessment: Diabetes control:  Uncontrolled Progress toward A1C goal:   Stagnant Comments: On lantus 35 units QHS, novolog 11 units TID (only taking BID), and metformin 1000 mg BID.  Plan: Medications:  Increase Lantus 38 units QHS, make sure you are taking Novolog 11 units TID before meals, continue metformin. Home glucose monitoring: Frequency:  TID Timing:  ACHS Instruction/counseling given: discussed diet Other plans: f/u 3 months

## 2014-09-26 NOTE — Assessment & Plan Note (Addendum)
Nasal congestion, sore throat and left sided ear pain with non-productive cough for 3 days. Her kids have been sick. Tried ibuprofen for ear pain with little relief. No associated fevers, chills, nausea, vomiting or shortness of breath. Likely URI, but completed throat swab for strep throat.  Plan: -Flonase  -Otic drops -Conservative management  Addendum: -Strep test positive, sent in Penicillin V 500 mg po TID x 10 days to [facility]

## 2014-09-26 NOTE — Progress Notes (Signed)
Internal Medicine Clinic Attending  Case discussed with Dr. Richardson at the time of the visit.  We reviewed the resident's history and exam and pertinent patient test results.  I agree with the assessment, diagnosis, and plan of care documented in the resident's note. 

## 2014-09-26 NOTE — Patient Instructions (Signed)
TAKE LANTUS 38 UNITS AT NIGHT.  CONTINUE NOVOLOG 11 UNITS THREE TIMES A DAY BEFORE MEALS.  PLEASE CHECK YOUR BLOOD SUGAR MORE OFTEN THROUGHOUT THE DAY.

## 2014-09-26 NOTE — Progress Notes (Signed)
Subjective:    Patient ID: Robin Klein, female    DOB: 1970-06-05, 44 y.o.   MRN: [id number]  HPI Robin Klein is a 44 y.o. female with PMHx of T2DM and HTN who presents to the clinic for follow up for diabetes. Please see A&P for the status of the patient's chronic medical problems.   Past Medical History  Diagnosis Date  . Diabetes mellitus   . Hypertension   . Fatty liver disease, nonalcoholic   . Depression   . Sinusitis     Outpatient Encounter Prescriptions as of 09/26/2014  Medication Sig  . amLODipine (NORVASC) 10 MG tablet Take 1 tablet (10 mg total) by mouth daily.  Marland Kitchen aspirin 81 MG tablet Take 81 mg by mouth daily.  . Blood Glucose Monitoring Suppl (WAVESENSE PRESTO PRO METER) DEVI Use to check blood sugar up to 2 times daily   . diclofenac sodium (VOLTAREN) 1 % GEL Apply 2 g topically 4 (four) times daily.  . fluconazole (DIFLUCAN) 150 MG tablet Take 1 tablet (150 mg total) by mouth daily. Take 1 pill every 3 days for 3 doses  . glucose blood (WAVESENSE PRESTO TEST) test strip Use to test blood sugar up to 2 times daily   . insulin aspart (NOVOLOG) 100 UNIT/ML injection Inject 11 Units into the skin 3 (three) times daily with meals. Inject 15 minutes before breakfast lunch and dinner.  . Insulin Glargine (LANTUS SOLOSTAR) 100 UNIT/ML Solostar Pen Inject 35 Units into the skin daily at 10 pm.  . Insulin Syringe-Needle U-100 (GNP INSULIN SYRINGE) 31G X 5/16" 0.5 ML MISC Use to inject insulin once daily   . Lancets MISC Use to check blood sugar as directed   . metFORMIN (GLUCOPHAGE) 1000 MG tablet Take 1 tablet (1,000 mg total) by mouth 2 (two) times daily with a meal.  . Prenatal Vit-Fe Fumarate-FA (PRENATAL 1 PLUS 1 PO) Take 1 tablet by mouth.     No facility-administered encounter medications on file as of 09/26/2014.    Family History  Problem Relation Age of Onset  . Diabetes Mother   . Diabetes Father   . Cancer Paternal Grandmother      Social History   Social History  . Marital Status: Married    Spouse Name: N/A  . Number of Children: N/A  . Years of Education: 7   Occupational History  .     Social History Main Topics  . Smoking status: Never Smoker   . Smokeless tobacco: Never Used  . Alcohol Use: No  . Drug Use: No  . Sexual Activity: Not on file   Other Topics Concern  . Not on file   Social History Narrative   Financial assistance approved for 100% discount at [facility] and has [facility] card per [name]   12/16/2009   Recently gave birth to a son 04/2009.    Review of Systems General: Denies fever, chills, fatigue HEENT: Admits to left ear pain, sore throat, nasal congestion.  Respiratory: Admits to non-productive cough. Denies SOB, chest tightness, and wheezing.   Cardiovascular: Denies chest pain and palpitations.  Gastrointestinal: Denies nausea, vomiting, abdominal pain, diarrhea Skin: Denies pallor, rash and wounds.  Neurological: Denies dizziness, headaches, weakness, lightheadedness     Objective:   Physical Exam Filed Vitals:   09/26/14 1602  BP: 124/78  Pulse: 80  Temp: 97.7 F (36.5 C)  TempSrc: Oral  Weight: 156 lb (70.761 kg)  SpO2: 100%   General:  Vital signs reviewed.  Patient is well-developed and well-nourished, in no acute distress and cooperative with exam.  HEENT: Normocephalic and atraumatic. EOMI, conjunctivae normal, no scleral icterus. Enlarged tonsils, no exudate. Mild erythema of posterior oropharynx. + anterior cervical lymphadenopathy. Edematous nasal turbinates.   Cardiovascular: RRR, S1 normal, S2 normal, no murmurs, gallops, or rubs. Pulmonary/Chest: Clear to auscultation bilaterally, no wheezes, rales, or rhonchi. Abdominal: Soft, non-tender, non-distended, BS + Extremities: No lower extremity edema bilaterally,  pulses symmetric and intact bilaterally.  Skin: Warm, dry and intact. No rashes or erythema. Psychiatric: Normal mood and affect. speech and  behavior is normal. Cognition and memory are normal.      Assessment & Plan:   Please see problem oriented assessment and plan for more information.

## 2014-09-27 LAB — STREP GP A AG, IA W/REFLEX: Strep Gp A Ag, IA W/Reflex: POSITIVE — AB

## 2014-09-27 MED ORDER — PENICILLIN V POTASSIUM 500 MG PO TABS: 500.0000 mg | ORAL_TABLET | Freq: Three times a day (TID) | ORAL | Status: DC

## 2014-09-27 NOTE — Addendum Note (Signed)
Addended by: Aldean Baker on: 09/27/2014 01:23 PM   Modules accepted: Orders

## 2014-09-28 ENCOUNTER — Other Ambulatory Visit: Payer: Self-pay | Admitting: Internal Medicine

## 2014-09-28 ENCOUNTER — Telehealth: Payer: Self-pay | Admitting: Dietician

## 2014-09-28 DIAGNOSIS — J069 Acute upper respiratory infection, unspecified: Secondary | ICD-10-CM

## 2014-09-28 MED ORDER — PENICILLIN V POTASSIUM 500 MG PO TABS: 500.0000 mg | ORAL_TABLET | Freq: Three times a day (TID) | ORAL | Status: DC

## 2014-09-28 NOTE — Telephone Encounter (Signed)
Dr. Senaida Ores took care of call.

## 2014-10-17 ENCOUNTER — Other Ambulatory Visit: Payer: Self-pay

## 2014-10-17 NOTE — Telephone Encounter (Signed)
Refill request received, pt can get this medication for free through [facility], hasn't been on since March 2016. Please advise.

## 2014-10-18 MED ORDER — ROSUVASTATIN CALCIUM 5 MG PO TABS: 5.0000 mg | ORAL_TABLET | Freq: Every day | ORAL | Status: DC

## 2014-11-15 ENCOUNTER — Ambulatory Visit: Payer: Self-pay

## 2014-11-20 ENCOUNTER — Encounter (HOSPITAL_COMMUNITY): Payer: Self-pay | Admitting: Emergency Medicine

## 2014-11-20 ENCOUNTER — Emergency Department (INDEPENDENT_AMBULATORY_CARE_PROVIDER_SITE_OTHER)
Admission: EM | Admit: 2014-11-20 | Discharge: 2014-11-20 | Disposition: A | Discharge status: Home / Self Care | Payer: Self-pay | Source: Home / Self Care | Attending: Emergency Medicine | Admitting: Emergency Medicine

## 2014-11-20 DIAGNOSIS — J020 Streptococcal pharyngitis: Secondary | ICD-10-CM

## 2014-11-20 LAB — POCT RAPID STREP A: Streptococcus, Group A Screen (Direct): NEGATIVE

## 2014-11-20 MED ORDER — AMOXICILLIN 500 MG PO CAPS: 500.0000 mg | ORAL_CAPSULE | Freq: Two times a day (BID) | ORAL | Status: DC

## 2014-11-20 NOTE — ED Provider Notes (Signed)
CSN: [id number]     Arrival date & time 11/20/14  1823 History   First MD Initiated Contact with Patient 11/20/14 1959     Chief Complaint  Patient presents with  . URI   (Consider location/radiation/quality/duration/timing/severity/associated sxs/prior Treatment) HPI She is a 44 year old woman here for evaluation of sore throat. Yesterday, she developed a sore throat, mild cough, and ear pain. She denies any fevers or chills. No nasal congestion or rhinorrhea. No nausea or vomiting. Her daughter was treated for strep 2 days ago. Her son has symptoms of strep throat as well.  She is eating and drinking well.  Past Medical History  Diagnosis Date  . Diabetes mellitus   . Hypertension   . Fatty liver disease, nonalcoholic   . Depression   . Sinusitis    Past Surgical History  Procedure Laterality Date  . Cesarean section     Family History  Problem Relation Age of Onset  . Diabetes Mother   . Diabetes Father   . Cancer Paternal Grandmother    Social History  Substance Use Topics  . Smoking status: Never Smoker   . Smokeless tobacco: Never Used  . Alcohol Use: No   OB History    Gravida Para Term Preterm AB TAB SAB Ectopic Multiple Living   0 1  1 0 0 3     Review of Systems As in history of present illness Allergies  Hctz  Home Medications   Prior to Admission medications   Medication Sig Start Date End Date Taking? Authorizing Provider  amLODipine (NORVASC) 10 MG tablet Take 1 tablet (10 mg total) by mouth daily. 08/15/14  Yes Alexa Dulcy Fanny, MD  metFORMIN (GLUCOPHAGE) 1000 MG tablet Take 1 tablet (1,000 mg total) by mouth 2 (two) times daily with a meal. 06/27/14  Yes Alexa Dulcy Fanny, MD  amoxicillin (AMOXIL) 500 MG capsule Take 1 capsule (500 mg total) by mouth 2 (two) times daily. 11/20/14   Charm Rings, MD  antipyrine-benzocaine Lyla Son) otic solution Place 3-4 drops into the left ear every 2 (two) hours as needed for ear pain. 09/26/14   Alexa Dulcy Fanny, MD  aspirin 81 MG tablet Take 81 mg by mouth daily.    Historical Provider, MD  Blood Glucose Monitoring Suppl (WAVESENSE PRESTO PRO METER) DEVI Use to check blood sugar up to 2 times daily     Historical Provider, MD  diclofenac sodium (VOLTAREN) 1 % GEL Apply 2 g topically 4 (four) times daily. 05/19/13   Leanora Cover, MD  fluconazole (DIFLUCAN) 150 MG tablet Take 1 tablet (150 mg total) by mouth daily. Take 1 pill every 3 days for 3 doses 08/15/14   Alexa Dulcy Fanny, MD  fluticasone [facility]) 50 MCG/ACT nasal spray Place 2 sprays into both nostrils daily. 09/26/14   Alexa Dulcy Fanny, MD  glucose blood (WAVESENSE PRESTO TEST) test strip Use to test blood sugar up to 2 times daily     Historical Provider, MD  insulin aspart (NOVOLOG) 100 UNIT/ML injection Inject 11 Units into the skin 3 (three) times daily with meals. Inject 15 minutes before breakfast lunch and dinner. 08/15/14   Alexa Dulcy Fanny, MD  Insulin Glargine (LANTUS SOLOSTAR) 100 UNIT/ML Solostar Pen Inject 38 Units into the skin daily at 10 pm. 09/26/14   Alexa Dulcy Fanny, MD  Insulin Syringe-Needle U-100 (GNP INSULIN SYRINGE) 31G X 5/16" 0.5 ML MISC Use to inject insulin once daily     Historical  Provider, MD  Lancets MISC Use to check blood sugar as directed     Historical Provider, MD  penicillin v potassium (VEETID) 500 MG tablet Take 1 tablet (500 mg total) by mouth 3 (three) times daily. 09/28/14   Alexa Dulcy FannyM Richardson, MD  Prenatal Vit-Fe Fumarate-FA (PRENATAL 1 PLUS 1 PO) Take 1 tablet by mouth.      Historical Provider, MD  rosuvastatin (CRESTOR) 5 MG tablet Take 1 tablet (5 mg total) by mouth at bedtime. 10/18/14 10/17/15  Alexa Dulcy FannyM Richardson, MD   Meds Ordered and Administered this Visit  Medications - No data to display  BP 134/94 mmHg  Pulse 89  Temp(Src) 98.3 F (36.8 C) (Oral)  Resp 16  SpO2 100% No data found.   Physical Exam  Constitutional: She is oriented to person, place, and time. She  appears well-developed and well-nourished. No distress.  HENT:  Mouth/Throat: No oropharyngeal exudate.  TMs normal bilaterally. Posterior pharynx is erythematous.  Neck: Neck supple.  Cardiovascular: Normal rate, regular rhythm and normal heart sounds.   No murmur heard. Pulmonary/Chest: Effort normal and breath sounds normal. No respiratory distress. She has no wheezes. She has no rales.  Lymphadenopathy:    She has no cervical adenopathy.  Neurological: She is alert and oriented to person, place, and time.    ED Course  Procedures (including critical care time)  Labs Review Labs Reviewed  POCT RAPID STREP A    Imaging Review No results found.   MDM   1. Strep pharyngitis    Son's test was positive for strep. Will treat presumptively with amoxicillin. Follow-up as needed.    Charm RingsErin J Levorn Oleski, MD 11/20/14 [phone]

## 2014-11-20 NOTE — Discharge Instructions (Signed)
You have strep throat. Take amoxicillin twice a day for the next 10 days. Make sure you are drinking plenty of fluids. Follow-up if you are not improving in 2-3 days.

## 2014-11-20 NOTE — ED Notes (Signed)
C/o cold sx onset yest associated w/bilateral ear pain, ST, and dry cough A&O x4... No acute distress.

## 2014-11-23 LAB — CULTURE, GROUP A STREP: Strep A Culture: NEGATIVE

## 2014-12-14 ENCOUNTER — Other Ambulatory Visit: Payer: Self-pay | Admitting: Internal Medicine

## 2015-01-10 ENCOUNTER — Ambulatory Visit (INDEPENDENT_AMBULATORY_CARE_PROVIDER_SITE_OTHER): Payer: Self-pay | Admitting: Internal Medicine

## 2015-01-10 VITALS — BP 132/88 | HR 84 | Temp 97.7°F | Resp 18 | Wt 155.3 lb

## 2015-01-10 DIAGNOSIS — Z7984 Long term (current) use of oral hypoglycemic drugs: Secondary | ICD-10-CM

## 2015-01-10 DIAGNOSIS — Z794 Long term (current) use of insulin: Secondary | ICD-10-CM

## 2015-01-10 DIAGNOSIS — E119 Type 2 diabetes mellitus without complications: Secondary | ICD-10-CM

## 2015-01-10 DIAGNOSIS — E785 Hyperlipidemia, unspecified: Secondary | ICD-10-CM

## 2015-01-10 DIAGNOSIS — IMO0001 Reserved for inherently not codable concepts without codable children: Secondary | ICD-10-CM

## 2015-01-10 DIAGNOSIS — E1165 Type 2 diabetes mellitus with hyperglycemia: Secondary | ICD-10-CM

## 2015-01-10 DIAGNOSIS — I10 Essential (primary) hypertension: Secondary | ICD-10-CM

## 2015-01-10 DIAGNOSIS — L304 Erythema intertrigo: Secondary | ICD-10-CM

## 2015-01-10 LAB — GLUCOSE, CAPILLARY: GLUCOSE-CAPILLARY: 283 mg/dL — AB (ref 65–99)

## 2015-01-10 LAB — POCT GLYCOSYLATED HEMOGLOBIN (HGB A1C): Hemoglobin A1C: 11.1

## 2015-01-10 LAB — HM DIABETES EYE EXAM

## 2015-01-10 MED ORDER — NYSTATIN 100000 UNIT/GM EX POWD: Freq: Four times a day (QID) | CUTANEOUS | Status: DC

## 2015-01-10 MED ORDER — INSULIN GLARGINE 100 UNIT/ML SOLOSTAR PEN: 42.0000 [IU] | PEN_INJECTOR | Freq: Every day | SUBCUTANEOUS | Status: DC

## 2015-01-10 NOTE — Assessment & Plan Note (Addendum)
BP Readings from Last 3 Encounters:  01/10/15 132/88  11/20/14 134/94  09/26/14 124/78    Lab Results  Component Value Date   NA 136 12/14/2013   K 3.7 12/14/2013   CREATININE 0.58 12/14/2013    Assessment: Blood pressure control:  At goal Progress toward BP goal:   At goal Comments: Compliant with amlodipine 10 mg daily.   Plan: Medications:  continue current medications Educational resources provided:   Self management tools provided:   Other plans: Allergies to ACEI and ARB

## 2015-01-10 NOTE — Assessment & Plan Note (Addendum)
Lab Results  Component Value Date   HGBA1C 11.1 01/10/2015   HGBA1C 10.7 09/26/2014   HGBA1C 10.7 06/27/2014     Assessment: Diabetes control:  Uncontrolled Progress toward A1C goal:   Deteriorated Comments: Patient is on Lantus 38 units QHS, Novolog 11 units TID before meals, and metformin 1000 mg BID. However, she reports only taking novolog once a day.   Plan: Medications:  Emphasized importance of compliance and taking novolog 11 TID before meals. Increase Lantus to 42 units QHS. Home glucose monitoring: Frequency:  TID Timing:  ACHS Instruction/counseling given: reminded to bring blood glucose meter & log to each visit, discussed the need for weight loss and discussed diet Educational resources provided:  Appointment made to meet with Darcus Pesteronna Self management tools provided: copy of home glucose meter download Other plans: Follow up 1 month

## 2015-01-10 NOTE — Assessment & Plan Note (Signed)
Patient complained of breast pain in right breast 2 weeks ago which resolved with Tylenol. However, while breast examination was normal bilaterally, intertrigo was noted. Breast pain was likely MSK in nature or due to intertrigo.   Plan: -Nystatin powder QID, keep area clean and dry -At follow up visit, discuss risk benefits to begin breast cancer screening

## 2015-01-10 NOTE — Progress Notes (Signed)
Subjective:    Patient ID: Robin Klein, female    DOB: 02/03/1970, 45 y.o.   MRN: [id number]  HPI Robin Klein is a 45 y.o. female with PMHx of T2DM, HTN, HLD who presents to the clinic for follow up for T2DM. Please see A&P for the status of the patient's chronic medical problems.   Past Medical History  Diagnosis Date  . Diabetes mellitus   . Hypertension   . Fatty liver disease, nonalcoholic   . Depression   . Sinusitis     Outpatient Encounter Prescriptions as of 01/10/2015  Medication Sig  . amLODipine (NORVASC) 10 MG tablet Take 1 tablet (10 mg total) by mouth daily.  Marland Kitchen. aspirin 81 MG tablet Take 81 mg by mouth daily.  . Blood Glucose Monitoring Suppl (WAVESENSE PRESTO PRO METER) DEVI Use to check blood sugar up to 2 times daily   . diclofenac sodium (VOLTAREN) 1 % GEL Apply 2 g topically 4 (four) times daily.  . fluticasone (FLONASE) 50 MCG/ACT nasal spray Place 2 sprays into both nostrils daily.  Marland Kitchen. glucose blood (WAVESENSE PRESTO TEST) test strip Use to test blood sugar up to 2 times daily   . insulin aspart (NOVOLOG) 100 UNIT/ML injection Inject 11 Units into the skin 3 (three) times daily with meals. Inject 15 minutes before breakfast lunch and dinner.  . Insulin Glargine (LANTUS SOLOSTAR) 100 UNIT/ML Solostar Pen Inject 42 Units into the skin daily at 10 pm.  . Insulin Syringe-Needle U-100 (GNP INSULIN SYRINGE) 31G X 5/16" 0.5 ML MISC Use to inject insulin once daily   . Lancets MISC Use to check blood sugar as directed   . metFORMIN (GLUCOPHAGE) 1000 MG tablet TAKE ONE TABLET BY MOUTH TWICE DAILY WITH FOOD  . nystatin (MYCOSTATIN) powder Apply topically 4 (four) times daily.  . Prenatal Vit-Fe Fumarate-FA (PRENATAL 1 PLUS 1 PO) Take 1 tablet by mouth.    . rosuvastatin (CRESTOR) 5 MG tablet Take 1 tablet (5 mg total) by mouth at bedtime.  . [DISCONTINUED] amoxicillin (AMOXIL) 500 MG capsule Take 1 capsule (500 mg total) by mouth 2 (two) times daily.    . [DISCONTINUED] antipyrine-benzocaine (AURALGAN) otic solution Place 3-4 drops into the left ear every 2 (two) hours as needed for ear pain.  . [DISCONTINUED] fluconazole (DIFLUCAN) 150 MG tablet Take 1 tablet (150 mg total) by mouth daily. Take 1 pill every 3 days for 3 doses  . [DISCONTINUED] Insulin Glargine (LANTUS SOLOSTAR) 100 UNIT/ML Solostar Pen Inject 38 Units into the skin daily at 10 pm.  . [DISCONTINUED] penicillin v potassium (VEETID) 500 MG tablet Take 1 tablet (500 mg total) by mouth 3 (three) times daily.   No facility-administered encounter medications on file as of 01/10/2015.    Family History  Problem Relation Age of Onset  . Diabetes Mother   . Diabetes Father   . Cancer Paternal Grandmother     Social History   Social History  . Marital Status: Married    Spouse Name: N/A  . Number of Children: N/A  . Years of Education: 7   Occupational History  .     Social History Main Topics  . Smoking status: Never Smoker   . Smokeless tobacco: Never Used  . Alcohol Use: No  . Drug Use: No  . Sexual Activity: Not on file   Other Topics Concern  . Not on file   Social History Narrative   Financial assistance approved for 100% discount  at [facility] and has [facility] card per [name]   12/16/2009   Recently gave birth to a son 04/2009.    Review of Systems General: Denies fever, chills.  Respiratory: Denies SOB, cough.   Cardiovascular: Denies chest pain and palpitations.  Chest: Admits to right sided breast pain. Denies discharge or lump. Gastrointestinal: Denies nausea, vomiting, abdominal pain Musculoskeletal: Denies myalgias, back pain  Skin: Denies rash and wounds.  Neurological: Denies dizziness, headaches, weakness, lightheadedness     Objective:   Physical Exam Filed Vitals:   01/10/15 0901  BP: 132/88  Pulse: 84  Temp: 97.7 F (36.5 C)  TempSrc: Oral  Resp: 18  Weight: 155 lb 4.8 oz (70.444 kg)  SpO2: 99%   General: Vital signs reviewed.   Patient is well-developed and well-nourished, in no acute distress and cooperative with exam.  Cardiovascular: RRR, S1 normal, S2 normal, no murmurs, gallops, or rubs. Breasts: Breasts appear normal, no suspicious masses, no skin or nipple changes or axillary nodes. Pulmonary/Chest: Clear to auscultation bilaterally, no wheezes, rales, or rhonchi. Abdominal: Soft, non-tender, non-distended, BS + Extremities: No lower extremity edema bilaterally, pulses symmetric and intact bilaterally. No cyanosis or clubbing. Skin: Candidal intertrigo under bilateral breasts.     Assessment & Plan:   Please see problem based assessment and plan.

## 2015-01-10 NOTE — Patient Instructions (Addendum)
PLEASE TAKE NOVOLOG 11 UNITS THREE TIMES A DAY BEFORE EACH MEAL  PLEASE TAKE LANTUS 42 UNITS AT NIGHT  THIS IS VERY IMPORTANT FOR YOUR DIABETES  KEEP TAKING METFORMIN 1000 MG TWICE A DAY  FOLLOW UP IN 1 MONTH.   USE THE POWDER UNDER YOUR BREASTS 3 OR 4 TIMES PER DAY. KEEP THE AREA CLEAN AND DRY.

## 2015-01-10 NOTE — Assessment & Plan Note (Signed)
Patient is on rosuvastatin 5 mg QHS.  Plan: -Continue rosuvastatin 5 mg QHS -Repeat lipid panel 06/2015

## 2015-01-11 NOTE — Progress Notes (Signed)
Internal Medicine Clinic Attending  Case discussed with Dr. Krall soon after the resident saw the patient.  We reviewed the resident's history and exam and pertinent patient test results.  I agree with the assessment, diagnosis, and plan of care documented in the resident's note. 

## 2015-01-11 NOTE — Addendum Note (Signed)
Addended by: Debe CoderMULLEN, Domonick Sittner B on: 01/11/2015 10:20 AM   Modules accepted: Level of Service

## 2015-02-05 ENCOUNTER — Encounter: Payer: Self-pay | Admitting: Dietician

## 2015-02-20 ENCOUNTER — Encounter: Payer: Self-pay | Admitting: Internal Medicine

## 2015-02-20 ENCOUNTER — Ambulatory Visit (INDEPENDENT_AMBULATORY_CARE_PROVIDER_SITE_OTHER): Payer: Self-pay | Admitting: Internal Medicine

## 2015-02-20 VITALS — BP 122/81 | HR 98 | Temp 98.3°F | Ht 60.0 in | Wt 156.2 lb

## 2015-02-20 DIAGNOSIS — Z794 Long term (current) use of insulin: Secondary | ICD-10-CM

## 2015-02-20 DIAGNOSIS — E1165 Type 2 diabetes mellitus with hyperglycemia: Secondary | ICD-10-CM

## 2015-02-20 DIAGNOSIS — M62838 Other muscle spasm: Secondary | ICD-10-CM | POA: Insufficient documentation

## 2015-02-20 DIAGNOSIS — Z0000 Encounter for general adult medical examination without abnormal findings: Secondary | ICD-10-CM

## 2015-02-20 DIAGNOSIS — I10 Essential (primary) hypertension: Secondary | ICD-10-CM

## 2015-02-20 LAB — GLUCOSE, CAPILLARY: Glucose-Capillary: 211 mg/dL — ABNORMAL HIGH (ref 65–99)

## 2015-02-20 MED ORDER — INSULIN GLARGINE 100 UNIT/ML SOLOSTAR PEN: 45.0000 [IU] | PEN_INJECTOR | Freq: Every day | SUBCUTANEOUS | Status: DC

## 2015-02-20 MED ORDER — INSULIN ASPART 100 UNIT/ML ~~LOC~~ SOLN: 12.0000 [IU] | Freq: Three times a day (TID) | SUBCUTANEOUS | Status: DC

## 2015-02-20 MED ORDER — CYCLOBENZAPRINE HCL 5 MG PO TABS: 5.0000 mg | ORAL_TABLET | Freq: Every evening | ORAL | Status: DC | PRN

## 2015-02-20 NOTE — Progress Notes (Signed)
Subjective:    Patient ID: Robin Klein, female    DOB: 30-Nov-1970, 45 y.o.   MRN: [id number]  HPI Robin Klein is a 45 y.o. female with PMHx of T2DM, HTN who presents to the clinic for T2DM. Please see A&P for the status of the patient's chronic medical problems.   Past Medical History  Diagnosis Date  . Diabetes mellitus   . Hypertension   . Fatty liver disease, nonalcoholic   . Depression   . Sinusitis     Outpatient Encounter Prescriptions as of 02/20/2015  Medication Sig  . amLODipine (NORVASC) 10 MG tablet Take 1 tablet (10 mg total) by mouth daily.  Marland Kitchen aspirin 81 MG tablet Take 81 mg by mouth daily.  . Blood Glucose Monitoring Suppl (WAVESENSE PRESTO PRO METER) DEVI Use to check blood sugar up to 2 times daily   . cyclobenzaprine (FLEXERIL) 5 MG tablet Take 1 tablet (5 mg total) by mouth at bedtime as needed for muscle spasms.  . diclofenac sodium (VOLTAREN) 1 % GEL Apply 2 g topically 4 (four) times daily.  . fluticasone (FLONASE) 50 MCG/ACT nasal spray Place 2 sprays into both nostrils daily.  Marland Kitchen glucose blood (WAVESENSE PRESTO TEST) test strip Use to test blood sugar up to 2 times daily   . insulin aspart (NOVOLOG) 100 UNIT/ML injection Inject 12 Units into the skin 3 (three) times daily with meals. Inject 15 minutes before breakfast lunch and dinner.  . Insulin Glargine (LANTUS SOLOSTAR) 100 UNIT/ML Solostar Pen Inject 45 Units into the skin daily at 10 pm.  . Insulin Syringe-Needle U-100 (GNP INSULIN SYRINGE) 31G X 5/16" 0.5 ML MISC Use to inject insulin once daily   . Lancets MISC Use to check blood sugar as directed   . metFORMIN (GLUCOPHAGE) 1000 MG tablet TAKE ONE TABLET BY MOUTH TWICE DAILY WITH FOOD  . nystatin (MYCOSTATIN) powder Apply topically 4 (four) times daily.  . Prenatal Vit-Fe Fumarate-FA (PRENATAL 1 PLUS 1 PO) Take 1 tablet by mouth.    . rosuvastatin (CRESTOR) 5 MG tablet Take 1 tablet (5 mg total) by mouth at bedtime.  .  [DISCONTINUED] insulin aspart (NOVOLOG) 100 UNIT/ML injection Inject 11 Units into the skin 3 (three) times daily with meals. Inject 15 minutes before breakfast lunch and dinner.  . [DISCONTINUED] Insulin Glargine (LANTUS SOLOSTAR) 100 UNIT/ML Solostar Pen Inject 42 Units into the skin daily at 10 pm.   No facility-administered encounter medications on file as of 02/20/2015.    Family History  Problem Relation Age of Onset  . Diabetes Mother   . Diabetes Father   . Cancer Paternal Grandmother     Social History   Social History  . Marital Status: Married    Spouse Name: N/A  . Number of Children: N/A  . Years of Education: 7   Occupational History  .     Social History Main Topics  . Smoking status: Never Smoker   . Smokeless tobacco: Never Used  . Alcohol Use: No  . Drug Use: No  . Sexual Activity: Not on file   Other Topics Concern  . Not on file   Social History Narrative   Financial assistance approved for 100% discount at [facility] and has [facility] card per [name]   12/16/2009   Recently gave birth to a son 04/2009.   Review of Systems General: Denies fatigue, change in appetite.  Respiratory: Denies SOB, DOE.   Cardiovascular: Denies chest pain and palpitations.  Gastrointestinal: Denies diarrhea, constipation.  Endocrine: Denies polyuria, and polydipsia. Musculoskeletal: Admits to left sided neck pain extending to left shoulder. Denies back pain, denies numbness.   Neurological: Denies dizziness, headaches, weakness, lightheadedness     Objective:   Physical Exam Filed Vitals:   02/20/15 1603  BP: 122/81  Pulse: 98  Temp: 98.3 F (36.8 C)  TempSrc: Oral  Height: 5' (1.524 m)  Weight: 156 lb 3.2 oz (70.852 kg)  SpO2: 100%   General: Vital signs reviewed.  Patient is well-developed and well-nourished, in no acute distress and cooperative with exam.  Cardiovascular: RRR, S1 normal, S2 normal, no murmurs, gallops, or rubs. Pulmonary/Chest: Clear to  auscultation bilaterally, no wheezes, rales, or rhonchi. Abdominal: Soft, non-tender, non-distended, BS + Musculoskeletal: + tenderness in left trapezius extending from superior left neck into left shoulder, negative Spurling's test Extremities: No lower extremity edema bilaterally, pulses symmetric and intact bilaterally.   Psychiatric: Normal mood and affect.      Assessment & Plan:   Please see problem based assessment and plan.

## 2015-02-20 NOTE — Assessment & Plan Note (Signed)
We discussed risks and benefits of breast cancer screening with mammography now that patient is in her 10s. Patient denies any family history of breast cancer. Patient will think about when she would like to begin screening mammograms.

## 2015-02-20 NOTE — Patient Instructions (Signed)
FOR YOUR MUSCLE SPASM: TOMA UNA PASTILLA DE FLEXERIL A NOCHE.   FOR YOUR DIABETES: -LANTUS 45 UNITS AT NIGHT -NOVOLOG 12 UNITS BEFORE EACH MEAL THREE TIMES A DAY  FOLLOW UP IN 2 MONTHS

## 2015-02-20 NOTE — Assessment & Plan Note (Signed)
Patient complains of intermittent left sided neck pain that extends into her left shoulder. Pain is tender and associated with tight muscles. It is often exacerbated by the way she sleeps at night. Pain is not in her spine. She denies radiating sharp or shooting pain into her left arm. She has tried ibuprofen for her symptoms without relief. On exam, Spurling's test is negative and patient is tender on palpation of her left trapezius muscle. Exam is most consistent with a trapezius muscle spasm.  Plan: Flexeril 5 mg QHS prn  Heating pad Stretching muscles

## 2015-02-20 NOTE — Assessment & Plan Note (Signed)
BP Readings from Last 3 Encounters:  02/20/15 122/81  01/10/15 132/88  11/20/14 134/94    Lab Results  Component Value Date   NA 136 12/14/2013   K 3.7 12/14/2013   CREATININE 0.58 12/14/2013    Assessment: Blood pressure control:  Controlled Progress toward BP goal:   At goal Comments: Compliant with amlodipine 10 mg daily.   Plan: Medications:  continue current medications

## 2015-02-20 NOTE — Assessment & Plan Note (Signed)
Lab Results  Component Value Date   HGBA1C 11.1 01/10/2015   HGBA1C 10.7 09/26/2014   HGBA1C 10.7 06/27/2014     Assessment: Diabetes control:  Uncontrolled Progress toward A1C goal:   Mildly improved Comments: Last visit, patient was instructed to take Lantus 42 units QHS and Novolog 11 units TID before meals. Patient has kept a detailed log with times of insulin use and CBG readings. She has been more consistent using Novolog but misses a dose occasionally. She consistently takes her Lantus with morning fasting glucoses in the 260s. She admits to late night snacks when her husband comes home from work as well. She has one reading in the 100s, most in the mid 200s and several in the 300s.   Plan: Medications:  Increase Lantus to 45 units QHS and Novolog to 12 units TID before meals. Try to avoid late night eating.  Home glucose monitoring: Frequency:  QID Timing:  ACHS Instruction/counseling given: reminded to bring blood glucose meter & log to each visit and discussed diet Educational resources provided:   Self management tools provided: copy of home glucose meter download Other plans: Follow up in 2 months

## 2015-02-25 NOTE — Progress Notes (Signed)
Internal Medicine Clinic Attending  Case discussed with Dr. Richardson at the time of the visit.  We reviewed the resident's history and exam and pertinent patient test results.  I agree with the assessment, diagnosis, and plan of care documented in the resident's note. 

## 2015-02-25 NOTE — Addendum Note (Signed)
Addended by: Debe Coder B on: 02/25/2015 09:12 AM   Modules accepted: Level of Service

## 2015-03-13 ENCOUNTER — Emergency Department (INDEPENDENT_AMBULATORY_CARE_PROVIDER_SITE_OTHER)
Admission: EM | Admit: 2015-03-13 | Discharge: 2015-03-13 | Disposition: A | Discharge status: Home / Self Care | Payer: No Typology Code available for payment source | Source: Home / Self Care | Attending: Emergency Medicine | Admitting: Emergency Medicine

## 2015-03-13 ENCOUNTER — Encounter (HOSPITAL_COMMUNITY): Payer: Self-pay | Admitting: Emergency Medicine

## 2015-03-13 DIAGNOSIS — J069 Acute upper respiratory infection, unspecified: Secondary | ICD-10-CM

## 2015-03-13 MED ORDER — AMOXICILLIN 500 MG PO CAPS: 500.0000 mg | ORAL_CAPSULE | Freq: Three times a day (TID) | ORAL | Status: DC

## 2015-03-13 MED ORDER — HYDROCODONE-HOMATROPINE 5-1.5 MG/5ML PO SYRP: 5.0000 mL | ORAL_SOLUTION | Freq: Four times a day (QID) | ORAL | Status: DC | PRN

## 2015-03-13 NOTE — ED Provider Notes (Signed)
CSN: [id number]     Arrival date & time 03/13/15  1534 History   First MD Initiated Contact with Patient 03/13/15 1739     Chief Complaint  Patient presents with  . Fever  . Sore Throat  . Otalgia   (Consider location/radiation/quality/duration/timing/severity/associated sxs/prior Treatment) HPI  She is a 45 year old woman here for evaluation of fever. She states for the last week she has had subjective fevers, sore throat, and bilateral ear pain. She also reports a cough productive of mucous. She denies any nausea or vomiting. No shortness of breath. She does report chest congestion as well as pain with coughing. Her son is sick with the same symptoms.  Past Medical History  Diagnosis Date  . Diabetes mellitus   . Hypertension   . Fatty liver disease, nonalcoholic   . Depression   . Sinusitis    Past Surgical History  Procedure Laterality Date  . Cesarean section     Family History  Problem Relation Age of Onset  . Diabetes Mother   . Diabetes Father   . Cancer Paternal Grandmother    Social History  Substance Use Topics  . Smoking status: Never Smoker   . Smokeless tobacco: Never Used  . Alcohol Use: No   OB History    Gravida Para Term Preterm AB TAB SAB Ectopic Multiple Living   0 1  1 0 0 3     Review of Systems As in history of present illness Allergies  Lisinopril; Losartan; and Hctz  Home Medications   Prior to Admission medications   Medication Sig Start Date End Date Taking? Authorizing Provider  amLODipine (NORVASC) 10 MG tablet Take 1 tablet (10 mg total) by mouth daily. 08/15/14  Yes Alexa Lucrezia Starch, MD  aspirin 81 MG tablet Take 81 mg by mouth daily.   Yes Historical Provider, MD  Blood Glucose Monitoring Suppl (WAVESENSE PRESTO PRO METER) DEVI Use to check blood sugar up to 2 times daily    Yes Historical Provider, MD  glucose blood (WAVESENSE PRESTO TEST) test strip Use to test blood sugar up to 2 times daily    Yes Historical Provider, MD   insulin aspart (NOVOLOG) 100 UNIT/ML injection Inject 12 Units into the skin 3 (three) times daily with meals. Inject 15 minutes before breakfast lunch and dinner. 02/20/15  Yes Alexa Lucrezia Starch, MD  Insulin Glargine (LANTUS SOLOSTAR) 100 UNIT/ML Solostar Pen Inject 45 Units into the skin daily at 10 pm. 02/20/15  Yes Alexa Lucrezia Starch, MD  Insulin Syringe-Needle U-100 (GNP INSULIN SYRINGE) 31G X 5/16" 0.5 ML MISC Use to inject insulin once daily    Yes Historical Provider, MD  Lancets MISC Use to check blood sugar as directed    Yes Historical Provider, MD  metFORMIN (GLUCOPHAGE) 1000 MG tablet TAKE ONE TABLET BY MOUTH TWICE DAILY WITH FOOD 12/15/14  Yes Alexa Lucrezia Starch, MD  amoxicillin (AMOXIL) 500 MG capsule Take 1 capsule (500 mg total) by mouth 3 (three) times daily. 03/13/15   Charm Rings, MD  cyclobenzaprine (FLEXERIL) 5 MG tablet Take 1 tablet (5 mg total) by mouth at bedtime as needed for muscle spasms. 02/20/15   Servando Snare, MD  diclofenac sodium (VOLTAREN) 1 % GEL Apply 2 g topically 4 (four) times daily. 05/19/13   Leanora Cover, MD  fluticasone (FLONASE) 50 MCG/ACT nasal spray Place 2 sprays into both nostrils daily. 09/26/14   Servando Snare, MD  HYDROcodone-homatropine (HYCODAN) 5-1.5 MG/5ML  syrup Take 5 mLs by mouth every 6 (six) hours as needed for cough. 03/13/15   Charm RingsErin J Krystl Wickware, MD  nystatin (MYCOSTATIN) powder Apply topically 4 (four) times daily. 01/10/15   Servando SnareAlexa R Burns, MD  Prenatal Vit-Fe Fumarate-FA (PRENATAL 1 PLUS 1 PO) Take 1 tablet by mouth.      Historical Provider, MD  rosuvastatin (CRESTOR) 5 MG tablet Take 1 tablet (5 mg total) by mouth at bedtime. 10/18/14 10/17/15  Servando SnareAlexa R Burns, MD   Meds Ordered and Administered this Visit  Medications - No data to display  BP 137/93 mmHg  Pulse 101  Temp(Src) 98.2 F (36.8 C) (Oral)  Resp 16  SpO2 100%  LMP 03/02/2015 (Exact Date) No data found.   Physical Exam  Constitutional: She is oriented to person, place, and time. She  appears well-developed and well-nourished. No distress.  HENT:  Nose: Nose normal.  Mouth/Throat: No oropharyngeal exudate.  Oropharynx is erythematous. TMs normal bilaterally. She does have an abrasion in the right ear canal.  Neck: Neck supple.  Cardiovascular: Normal rate, regular rhythm and normal heart sounds.   No murmur heard. Pulmonary/Chest: Effort normal and breath sounds normal. No respiratory distress. She has no wheezes. She has no rales.  Lymphadenopathy:    She has cervical adenopathy.  Neurological: She is alert and oriented to person, place, and time.    ED Course  Procedures (including critical care time)  Labs Review Labs Reviewed - No data to display  Imaging Review No results found.    MDM   1. URI (upper respiratory infection)    Given duration of symptoms will treat with amoxicillin. Hycodan as needed for cough. Follow-up as needed.    Charm RingsErin J Seeley Hissong, MD 03/13/15 [phone]

## 2015-03-13 NOTE — Discharge Instructions (Signed)
You have an upper respiratory infection. Take amoxicillin 3 times a day for 7 days. Use the Hycodan every 4-6 hours as needed for coughing. You should start to feel better over the weekend. Follow-up as needed.

## 2015-03-13 NOTE — ED Notes (Signed)
[facility] a [facility] bilateral otalgia, a sore throat and fever x 7 days.

## 2015-04-15 ENCOUNTER — Other Ambulatory Visit: Payer: Self-pay

## 2015-04-15 DIAGNOSIS — I10 Essential (primary) hypertension: Secondary | ICD-10-CM

## 2015-04-15 MED ORDER — AMLODIPINE BESYLATE 10 MG PO TABS: 10.0000 mg | ORAL_TABLET | Freq: Every day | ORAL | Status: DC

## 2015-04-15 NOTE — Telephone Encounter (Signed)
Called into [facility] 

## 2015-04-15 NOTE — Telephone Encounter (Signed)
[facility] requesting new rx sent to them- [facility].

## 2015-04-30 ENCOUNTER — Telehealth: Payer: Self-pay | Admitting: Internal Medicine

## 2015-04-30 NOTE — Telephone Encounter (Signed)
PPT. REMINDER CALL,LMTCB °

## 2015-05-01 ENCOUNTER — Ambulatory Visit (INDEPENDENT_AMBULATORY_CARE_PROVIDER_SITE_OTHER): Payer: No Typology Code available for payment source | Admitting: Internal Medicine

## 2015-05-01 ENCOUNTER — Encounter: Payer: Self-pay | Admitting: Internal Medicine

## 2015-05-01 VITALS — BP 132/85 | HR 77 | Temp 98.2°F | Ht 62.0 in | Wt 158.4 lb

## 2015-05-01 DIAGNOSIS — Z794 Long term (current) use of insulin: Secondary | ICD-10-CM

## 2015-05-01 DIAGNOSIS — E1165 Type 2 diabetes mellitus with hyperglycemia: Secondary | ICD-10-CM

## 2015-05-01 DIAGNOSIS — Z79899 Other long term (current) drug therapy: Secondary | ICD-10-CM

## 2015-05-01 DIAGNOSIS — I10 Essential (primary) hypertension: Secondary | ICD-10-CM

## 2015-05-01 DIAGNOSIS — IMO0001 Reserved for inherently not codable concepts without codable children: Secondary | ICD-10-CM

## 2015-05-01 LAB — GLUCOSE, CAPILLARY: Glucose-Capillary: 195 mg/dL — ABNORMAL HIGH (ref 65–99)

## 2015-05-01 LAB — POCT GLYCOSYLATED HEMOGLOBIN (HGB A1C): Hemoglobin A1C: 11

## 2015-05-01 MED ORDER — INSULIN ASPART 100 UNIT/ML ~~LOC~~ SOLN: 15.0000 [IU] | Freq: Three times a day (TID) | SUBCUTANEOUS | Status: DC

## 2015-05-01 MED ORDER — METFORMIN HCL 1000 MG PO TABS: 1000.0000 mg | ORAL_TABLET | Freq: Two times a day (BID) | ORAL | Status: DC

## 2015-05-01 MED ORDER — INSULIN GLARGINE 100 UNIT/ML SOLOSTAR PEN: 50.0000 [IU] | PEN_INJECTOR | Freq: Every day | SUBCUTANEOUS | Status: DC

## 2015-05-01 NOTE — Progress Notes (Signed)
Subjective:    Patient ID: Robin Klein, female    DOB: 05/14/1970, 45 y.o.   MRN: [id number]  HPI Robin Klein is a 45 y.o. female with PMHx of T2DM, HLD, HTN who presents to the clinic for follow up for T2DM. Please see A&P for the status of the patient's chronic medical problems.   Past Medical History  Diagnosis Date  . Diabetes mellitus   . Hypertension   . Fatty liver disease, nonalcoholic   . Depression   . Sinusitis     Outpatient Encounter Prescriptions as of 05/01/2015  Medication Sig  . amLODipine (NORVASC) 10 MG tablet Take 1 tablet (10 mg total) by mouth daily.  Marland Kitchen. aspirin 81 MG tablet Take 81 mg by mouth daily.  . Blood Glucose Monitoring Suppl (WAVESENSE PRESTO PRO METER) DEVI Use to check blood sugar up to 2 times daily   . fluticasone (FLONASE) 50 MCG/ACT nasal spray Place 2 sprays into both nostrils daily.  Marland Kitchen. glucose blood (WAVESENSE PRESTO TEST) test strip Use to test blood sugar up to 2 times daily   . insulin aspart (NOVOLOG) 100 UNIT/ML injection Inject 15 Units into the skin 3 (three) times daily with meals.  . Insulin Glargine (LANTUS SOLOSTAR) 100 UNIT/ML Solostar Pen Inject 50 Units into the skin daily at 10 pm.  . Insulin Syringe-Needle U-100 (GNP INSULIN SYRINGE) 31G X 5/16" 0.5 ML MISC Use to inject insulin once daily   . Lancets MISC Use to check blood sugar as directed   . metFORMIN (GLUCOPHAGE) 1000 MG tablet Take 1 tablet (1,000 mg total) by mouth 2 (two) times daily with a meal.  . Prenatal Vit-Fe Fumarate-FA (PRENATAL 1 PLUS 1 PO) Take 1 tablet by mouth.    . rosuvastatin (CRESTOR) 5 MG tablet Take 1 tablet (5 mg total) by mouth at bedtime.  . [DISCONTINUED] amoxicillin (AMOXIL) 500 MG capsule Take 1 capsule (500 mg total) by mouth 3 (three) times daily.  . [DISCONTINUED] cyclobenzaprine (FLEXERIL) 5 MG tablet Take 1 tablet (5 mg total) by mouth at bedtime as needed for muscle spasms.  . [DISCONTINUED] diclofenac sodium  (VOLTAREN) 1 % GEL Apply 2 g topically 4 (four) times daily.  . [DISCONTINUED] HYDROcodone-homatropine (HYCODAN) 5-1.5 MG/5ML syrup Take 5 mLs by mouth every 6 (six) hours as needed for cough.  . [DISCONTINUED] insulin aspart (NOVOLOG) 100 UNIT/ML injection Inject 12 Units into the skin 3 (three) times daily with meals. Inject 15 minutes before breakfast lunch and dinner.  . [DISCONTINUED] Insulin Glargine (LANTUS SOLOSTAR) 100 UNIT/ML Solostar Pen Inject 45 Units into the skin daily at 10 pm.  . [DISCONTINUED] metFORMIN (GLUCOPHAGE) 1000 MG tablet TAKE ONE TABLET BY MOUTH TWICE DAILY WITH FOOD  . [DISCONTINUED] nystatin (MYCOSTATIN) powder Apply topically 4 (four) times daily.   No facility-administered encounter medications on file as of 05/01/2015.    Family History  Problem Relation Age of Onset  . Diabetes Mother   . Diabetes Father   . Cancer Paternal Grandmother     Social History   Social History  . Marital Status: Married    Spouse Name: N/A  . Number of Children: N/A  . Years of Education: 7   Occupational History  .     Social History Main Topics  . Smoking status: Never Smoker   . Smokeless tobacco: Never Used  . Alcohol Use: No  . Drug Use: No  . Sexual Activity: Not on file   Other Topics Concern  .  Not on file   Social History Narrative   Financial assistance approved for 100% discount at [facility] and has [facility] card per [name]   12/16/2009   Recently gave birth to a son 04/2009.   Review of Systems General: Denies fatigue, change in appetite.  Respiratory: Denies SOB, DOE.   Cardiovascular: Denies chest pain and palpitations.  Gastrointestinal: Denies nausea, vomiting, abdominal pain, diarrhea, constipation.  Endocrine: Denies polyuria, and polydipsia. Musculoskeletal: Denies myalgias.  Neurological: Denies dizziness, headaches, weakness, lightheadedness, numbness     Objective:   Physical Exam  Filed Vitals:   05/01/15 1518  BP: 132/85  Pulse:  77  Temp: 98.2 F (36.8 C)  TempSrc: Oral  Height:  (1.575 m)  Weight: 158 lb 6.4 oz (71.85 kg)  SpO2: 99%   General: Vital signs reviewed.  Patient is well-developed and well-nourished, in no acute distress and cooperative with exam.  Head: Normocephalic and atraumatic. Eyes: EOMI, conjunctivae normal, no scleral icterus.  Neck: Supple, trachea midline, no carotid bruit present.  Cardiovascular: RRR, S1 normal, S2 normal, no murmurs, gallops, or rubs. Pulmonary/Chest: Clear to auscultation bilaterally, no wheezes, rales, or rhonchi. Abdominal: Soft, non-tender, non-distended, BS +.  Extremities: No lower extremity edema bilaterally, pulses symmetric and intact bilaterally.  Skin: Warm, dry and intact. Psychiatric: Normal mood and affect. speech and behavior is normal. Cognition and memory are normal.      Assessment & Plan:   Please see problem based assessment and plan.

## 2015-05-01 NOTE — Patient Instructions (Signed)
TOMA NOVOLOG 15 UNITS ANTES DE DESAYUNO, ALMUERZO Y CENA.  TOMA LANTUS 50 UNITS POR LA NOCHE.   REGRASA EN TRES MESAS.

## 2015-05-01 NOTE — Assessment & Plan Note (Addendum)
BP Readings from Last 3 Encounters:  05/01/15 132/85  03/13/15 137/93  02/20/15 122/81    Lab Results  Component Value Date   NA 136 12/14/2013   K 3.7 12/14/2013   CREATININE 0.58 12/14/2013    Assessment: Blood pressure control:  Controlled Progress toward BP goal:   Stable Comments: Compliant with amlodipine 10 mg daily.   Plan: Medications:  continue current medications Educational resources provided:   Self management tools provided:   Other plans: Follow up in 3 months

## 2015-05-01 NOTE — Assessment & Plan Note (Addendum)
Lab Results  Component Value Date   HGBA1C 11.0 05/01/2015   HGBA1C 11.1 01/10/2015   HGBA1C 10.7 09/26/2014     Assessment: Diabetes control:  Uncontrolled Progress toward A1C goal:    Stagnant, unchanged Comments: Currently on Lantus 45 units QHS (but actually taking 48 units QHS), Metformin 1000 mg BID, and Novolog 12 units TID (ran out for 10 days). Forgot meter, but states she is persistently hyperglyemic throughout the day in the 300s. Lowest fasting glucose was 174.   Plan: Medications:  Increase Lantus to 50 units QHS, Novolog to 15 units TID AC. Continue Metformin 1000 mg BID. Home glucose monitoring: Frequency:  QID Timing:  ACHS Instruction/counseling given: reminded to bring blood glucose meter & log to each visit, reminded to bring medications to each visit and discussed diet Educational resources provided: brochure Self management tools provided:   Other plans: Follow up in 3 months

## 2015-05-02 NOTE — Progress Notes (Signed)
Internal Medicine Clinic Attending  Case discussed with Dr. Burns soon after the resident saw the patient.  We reviewed the resident's history and exam and pertinent patient test results.  I agree with the assessment, diagnosis, and plan of care documented in the resident's note. 

## 2015-05-08 ENCOUNTER — Encounter: Payer: Self-pay | Admitting: *Deleted

## 2015-05-13 ENCOUNTER — Telehealth: Payer: Self-pay | Admitting: Internal Medicine

## 2015-05-13 NOTE — Telephone Encounter (Signed)
APT. REMINDER CALL, LMTCB °

## 2015-05-14 ENCOUNTER — Ambulatory Visit: Payer: No Typology Code available for payment source

## 2015-05-29 ENCOUNTER — Emergency Department (HOSPITAL_COMMUNITY): Payer: No Typology Code available for payment source

## 2015-05-29 ENCOUNTER — Encounter (HOSPITAL_COMMUNITY): Payer: Self-pay | Admitting: *Deleted

## 2015-05-29 ENCOUNTER — Emergency Department (HOSPITAL_COMMUNITY)
Admission: EM | Admit: 2015-05-29 | Discharge: 2015-05-29 | Disposition: A | Discharge status: Home / Self Care | Payer: No Typology Code available for payment source | Attending: Emergency Medicine | Admitting: Emergency Medicine

## 2015-05-29 DIAGNOSIS — Z79899 Other long term (current) drug therapy: Secondary | ICD-10-CM | POA: Diagnosis not present

## 2015-05-29 DIAGNOSIS — E119 Type 2 diabetes mellitus without complications: Secondary | ICD-10-CM | POA: Diagnosis not present

## 2015-05-29 DIAGNOSIS — Z8659 Personal history of other mental and behavioral disorders: Secondary | ICD-10-CM | POA: Insufficient documentation

## 2015-05-29 DIAGNOSIS — Y998 Other external cause status: Secondary | ICD-10-CM | POA: Insufficient documentation

## 2015-05-29 DIAGNOSIS — Z7984 Long term (current) use of oral hypoglycemic drugs: Secondary | ICD-10-CM | POA: Insufficient documentation

## 2015-05-29 DIAGNOSIS — Z8719 Personal history of other diseases of the digestive system: Secondary | ICD-10-CM | POA: Diagnosis not present

## 2015-05-29 DIAGNOSIS — S29002A Unspecified injury of muscle and tendon of back wall of thorax, initial encounter: Secondary | ICD-10-CM | POA: Insufficient documentation

## 2015-05-29 DIAGNOSIS — Z7951 Long term (current) use of inhaled steroids: Secondary | ICD-10-CM | POA: Diagnosis not present

## 2015-05-29 DIAGNOSIS — Z8709 Personal history of other diseases of the respiratory system: Secondary | ICD-10-CM | POA: Insufficient documentation

## 2015-05-29 DIAGNOSIS — S0990XA Unspecified injury of head, initial encounter: Secondary | ICD-10-CM | POA: Insufficient documentation

## 2015-05-29 DIAGNOSIS — Z7982 Long term (current) use of aspirin: Secondary | ICD-10-CM | POA: Insufficient documentation

## 2015-05-29 DIAGNOSIS — Y9389 Activity, other specified: Secondary | ICD-10-CM | POA: Diagnosis not present

## 2015-05-29 DIAGNOSIS — S199XXA Unspecified injury of neck, initial encounter: Secondary | ICD-10-CM | POA: Diagnosis not present

## 2015-05-29 DIAGNOSIS — I10 Essential (primary) hypertension: Secondary | ICD-10-CM | POA: Diagnosis not present

## 2015-05-29 DIAGNOSIS — Z794 Long term (current) use of insulin: Secondary | ICD-10-CM | POA: Insufficient documentation

## 2015-05-29 DIAGNOSIS — Y92410 Unspecified street and highway as the place of occurrence of the external cause: Secondary | ICD-10-CM | POA: Insufficient documentation

## 2015-05-29 MED ORDER — CYCLOBENZAPRINE HCL 5 MG PO TABS: 5.0000 mg | ORAL_TABLET | Freq: Three times a day (TID) | ORAL | Status: DC | PRN

## 2015-05-29 MED ORDER — IBUPROFEN 800 MG PO TABS: 800.0000 mg | ORAL_TABLET | Freq: Three times a day (TID) | ORAL | Status: DC

## 2015-05-29 MED ORDER — CYCLOBENZAPRINE HCL 10 MG PO TABS
5.0000 mg | ORAL_TABLET | Freq: Once | ORAL | Status: AC
  Administered 2015-05-29: 5 mg via ORAL
  Filled 2015-05-29: qty 1

## 2015-05-29 MED ORDER — HYDROCODONE-ACETAMINOPHEN 5-325 MG PO TABS
1.0000 | ORAL_TABLET | Freq: Once | ORAL | Status: AC
  Administered 2015-05-29: 1 via ORAL
  Filled 2015-05-29: qty 1

## 2015-05-29 MED ORDER — KETOROLAC TROMETHAMINE 60 MG/2ML IM SOLN
60.0000 mg | Freq: Once | INTRAMUSCULAR | Status: AC
  Administered 2015-05-29: 60 mg via INTRAMUSCULAR
  Filled 2015-05-29: qty 2

## 2015-05-29 NOTE — ED Notes (Addendum)
Pt states she is feeling better .
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
## 2015-05-29 NOTE — ED Notes (Signed)
Patient transported to X-ray 
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
## 2015-07-31 ENCOUNTER — Encounter: Payer: Self-pay | Admitting: Internal Medicine

## 2015-08-14 ENCOUNTER — Encounter: Payer: Self-pay | Admitting: Internal Medicine

## 2015-08-14 ENCOUNTER — Encounter (INDEPENDENT_AMBULATORY_CARE_PROVIDER_SITE_OTHER): Payer: Self-pay

## 2015-08-14 ENCOUNTER — Other Ambulatory Visit: Payer: Self-pay | Admitting: Internal Medicine

## 2015-08-14 ENCOUNTER — Ambulatory Visit (INDEPENDENT_AMBULATORY_CARE_PROVIDER_SITE_OTHER): Payer: Self-pay | Admitting: Internal Medicine

## 2015-08-14 VITALS — BP 126/78 | HR 100 | Temp 98.6°F | Ht 62.0 in | Wt 164.3 lb

## 2015-08-14 DIAGNOSIS — Z794 Long term (current) use of insulin: Secondary | ICD-10-CM

## 2015-08-14 DIAGNOSIS — E119 Type 2 diabetes mellitus without complications: Secondary | ICD-10-CM

## 2015-08-14 DIAGNOSIS — I10 Essential (primary) hypertension: Secondary | ICD-10-CM

## 2015-08-14 DIAGNOSIS — IMO0001 Reserved for inherently not codable concepts without codable children: Secondary | ICD-10-CM

## 2015-08-14 DIAGNOSIS — K760 Fatty (change of) liver, not elsewhere classified: Secondary | ICD-10-CM

## 2015-08-14 DIAGNOSIS — E785 Hyperlipidemia, unspecified: Secondary | ICD-10-CM

## 2015-08-14 DIAGNOSIS — E1165 Type 2 diabetes mellitus with hyperglycemia: Principal | ICD-10-CM

## 2015-08-14 DIAGNOSIS — Z79899 Other long term (current) drug therapy: Secondary | ICD-10-CM

## 2015-08-14 DIAGNOSIS — R740 Nonspecific elevation of levels of transaminase and lactic acid dehydrogenase [LDH]: Secondary | ICD-10-CM

## 2015-08-14 LAB — GLUCOSE, CAPILLARY: GLUCOSE-CAPILLARY: 293 mg/dL — AB (ref 65–99)

## 2015-08-14 LAB — POCT GLYCOSYLATED HEMOGLOBIN (HGB A1C): Hemoglobin A1C: 11.4

## 2015-08-14 MED ORDER — INSULIN GLARGINE 100 UNIT/ML SOLOSTAR PEN: 54.0000 [IU] | PEN_INJECTOR | Freq: Every day | SUBCUTANEOUS | 4 refills | Status: DC

## 2015-08-14 MED ORDER — ATORVASTATIN CALCIUM 20 MG PO TABS
20.0000 mg | ORAL_TABLET | Freq: Every day | ORAL | 11 refills | Status: DC
Start: 2015-08-14 — End: 2015-08-19

## 2015-08-14 MED ORDER — GLIPIZIDE 10 MG PO TABS: 10.0000 mg | ORAL_TABLET | Freq: Every day | ORAL | 11 refills | Status: DC

## 2015-08-14 MED ORDER — INSULIN ASPART 100 UNIT/ML ~~LOC~~ SOLN: 16.0000 [IU] | Freq: Three times a day (TID) | SUBCUTANEOUS | 11 refills | Status: DC

## 2015-08-14 NOTE — Assessment & Plan Note (Signed)
Lab Results  Component Value Date   HGBA1C 11.4 08/14/2015   HGBA1C 11.0 05/01/2015   HGBA1C 11.1 01/10/2015     Assessment: Diabetes control:  Uncontrolled Progress toward A1C goal:   Deteriorated Comments: Patient reports compliance with Metformin 1000 mg BID but admits that she sometimes misses her Lantus 50 units QHS dose and her Novolog 15 units TID WC doses.   Plan: Medications:  Continue Metformin. Add glipizide 10 mg BID. Increase Lantus to 54 units QHS and Novolog to 16 units TID WC.  Home glucose monitoring: Frequency:  QID Timing:  ACHS Instruction/counseling given: reminded to bring blood glucose meter & log to each visit, reminded to bring medications to each visit, discussed the need for weight loss, discussed diet and other instruction/counseling: Compliance with medications Educational resources provided:   Self management tools provided:   Other plans: The limiting factor to diabetic control is patient compliance with insulin. I have had a very difficult time in improving her diabetes due to this. Given [facility], we are limiting in medications and in referral to Endocrinology. I do feel that I am at a loss here. We will have the patient return in one month for further evaluation, at that time, we will discuss further options for medications and referrals with Lupita Leashonna and Dr. Selena BattenKim.

## 2015-08-14 NOTE — Progress Notes (Signed)
    CC: T2DM  HPI: [name] is a 45 y.o. female with PMHx of Uncontrolled T2DM, HTN who presents to the clinic for follow up for T2DM. Please see problem based assessment and plan for more information.   Past Medical History:  Diagnosis Date  . Depression   . Diabetes mellitus   . Fatty liver disease, nonalcoholic   . Hypertension   . Sinusitis     Review of Systems: A complete ROS was negative except as noted in HPI.   Physical Exam: Vitals:   08/14/15 1505  BP: 126/78  Pulse: 100  Temp: 98.6 F (37 C)  TempSrc: Oral  SpO2: 99%  Weight: 164 lb 4.8 oz (74.5 kg)  Height: 5\' 2"  (1.575 m)   General: Vital signs reviewed.  Patient is well-developed and well-nourished, in no acute distress and cooperative with exam.  Neck: Supple, trachea midline, no carotid bruit present.  Cardiovascular: RRR, S1 normal, S2 normal, no murmurs, gallops, or rubs. Pulmonary/Chest: Clear to auscultation bilaterally, no wheezes, rales, or rhonchi. Abdominal: Soft, non-tender, non-distended, BS +, obese.  Extremities: No lower extremity edema bilaterally, pulses symmetric and intact bilaterally.  Skin: Warm, dry and intact. No rashes or erythema. Psychiatric: Normal mood and affect. speech and behavior is normal. Cognition and memory are normal.   Assessment & Plan:  See encounters tab for problem based medical decision making. Patient discussed with Dr. Criselda PeachesMullen

## 2015-08-14 NOTE — Patient Instructions (Signed)
START TAKING GLIPIZIDE 10 MG TWICE A DAY (FOR DIABETES). TAKE LANTUS 54 UNITS AT NIGHT. TAKE NOVOLOG 16 UNITS THREE TIMES A DAY BEFORE MEALS.  START TAKING ATORVASTATIN 20 MG ONCE A DAY (FOR CHOLESTEROL).   WE WILL CHECK BLOOD WORK TODAY.  FOLLOW UP IN 1 MONTH.

## 2015-08-14 NOTE — Progress Notes (Signed)
    CC: T2DM  HPI: [name] is a 45 y.o. female with PMHx of Uncontrolled T2DM and HTN who presents to the clinic for follow up for T2DM. Please see problem based assessment and plan for more information.   Past Medical History:  Diagnosis Date  . Depression   . Diabetes mellitus   . Fatty liver disease, nonalcoholic   . Hypertension   . Sinusitis     Review of Systems: A complete ROS was negative except as noted in HPI.   Physical Exam: Vitals:   08/14/15 1505  BP: 126/78  Pulse: 100  Temp: 98.6 F (37 C)  TempSrc: Oral  SpO2: 99%  Weight: 164 lb 4.8 oz (74.5 kg)  Height: 5\' 2"  (1.575 m)   General: Vital signs reviewed.  Patient is well-developed and well-nourished, in no acute distress and cooperative with exam.  Head: Normocephalic and atraumatic. Eyes: EOMI, conjunctivae normal, no scleral icterus.  Neck: Supple, trachea midline, normal ROM, no JVD, masses, thyromegaly, or carotid bruit present.  Cardiovascular: RRR, S1 normal, S2 normal, no murmurs, gallops, or rubs. Pulmonary/Chest: Clear to auscultation bilaterally, no wheezes, rales, or rhonchi. Abdominal: Soft, non-tender, non-distended, BS +, no masses, organomegaly, or guarding present.  Musculoskeletal: No joint deformities, erythema, or stiffness, ROM full and nontender. Extremities: No lower extremity edema bilaterally,  pulses symmetric and intact bilaterally. No cyanosis or clubbing. Neurological: A&O x3, Strength is normal and symmetric bilaterally, cranial nerve II-XII are grossly intact, no focal motor deficit, sensory intact to light touch bilaterally.  Skin: Warm, dry and intact. No rashes or erythema. Psychiatric: Normal mood and affect. speech and behavior is normal. Cognition and memory are normal.   Assessment & Plan:  See encounters tab for problem based medical decision making. Patient discussed with Dr. Criselda PeachesMullen

## 2015-08-14 NOTE — Assessment & Plan Note (Addendum)
Patient has a history of NAFLD diagnosed on ultrasound and with chronically mildly elevated LFTs. Patient is Hep B negative, including immunity. She is Hep A antibody positive but IgM negative, indicating prior infection versus immunization.   Plan: -Repeat CMET -Continue Metformin  -Counsel on weight loss -Discuss immunization to Hepatitis B -Patient is at high risk of developing cirrhosis due to uncontrolled T2DM  Addendum: AST, ALT, Alk Phos >100. Will also need to monitor for progression to cirrhosis. Last Abdominal US in 2012. Will hold statin medication for now and repeat CMET in one month. We will also order an abdominal ultrasound.

## 2015-08-14 NOTE — Assessment & Plan Note (Addendum)
Switched from rosuvastatin 5 mg daily to atorvastatin 20 mg [facility]. However, given elevated LFTs, will hold statin for now and repeat CMET in one month in the setting of NAFLD.

## 2015-08-14 NOTE — Assessment & Plan Note (Signed)
BP Readings from Last 3 Encounters:  08/14/15 126/78  05/29/15 138/81  05/01/15 132/85    Lab Results  Component Value Date   NA 136 12/14/2013   K 3.7 12/14/2013   CREATININE 0.58 12/14/2013    Assessment: Blood pressure control:  Controlled Progress toward BP goal:   At goal Comments: Compliant with amlodipine 10 mg daily.  Plan: Medications:  continue current medications

## 2015-08-15 LAB — CMP14 + ANION GAP
ALBUMIN: 3.9 g/dL (ref 3.5–5.5)
ALK PHOS: 173 IU/L — AB (ref 39–117)
ALT: 188 IU/L — AB (ref 0–32)
AST: 119 IU/L — ABNORMAL HIGH (ref 0–40)
Albumin/Globulin Ratio: 1.3 (ref 1.2–2.2)
Anion Gap: 16 mmol/L (ref 10.0–18.0)
BUN/Creatinine Ratio: 29 — ABNORMAL HIGH (ref 9–23)
BUN: 16 mg/dL (ref 6–24)
CHLORIDE: 102 mmol/L (ref 96–106)
CO2: 21 mmol/L (ref 18–29)
CREATININE: 0.56 mg/dL — AB (ref 0.57–1.00)
Calcium: 9.5 mg/dL (ref 8.7–10.2)
GFR calc non Af Amer: 115 mL/min/{1.73_m2} (ref >59)
GFR, EST AFRICAN AMERICAN: 132 mL/min/{1.73_m2} (ref >59)
Globulin, Total: 3.1 g/dL (ref 1.5–4.5)
Glucose: 293 mg/dL — ABNORMAL HIGH (ref 65–99)
POTASSIUM: 4.1 mmol/L (ref 3.5–5.2)
Sodium: 139 mmol/L (ref 134–144)
Total Protein: 7 g/dL (ref 6.0–8.5)

## 2015-08-15 LAB — CBC
HEMOGLOBIN: 14.7 g/dL (ref 11.1–15.9)
Hematocrit: 41.9 % (ref 34.0–46.6)
MCH: 30.4 pg (ref 26.6–33.0)
MCHC: 35.1 g/dL (ref 31.5–35.7)
MCV: 87 fL (ref 79–97)
Platelets: 285 10*3/uL (ref 150–379)
RBC: 4.84 x10E6/uL (ref 3.77–5.28)
RDW: 14.3 % (ref 12.3–15.4)
WBC: 6.3 10*3/uL (ref 3.4–10.8)

## 2015-08-16 NOTE — Progress Notes (Signed)
Internal Medicine Clinic Attending  Case discussed with Dr. Lawerance BachBurns at the time of the visit.  We reviewed the resident's history and exam and pertinent patient test results.  I agree with the assessment, diagnosis, and plan of care documented in the resident's note.  Patient needs further workup for transaminitis, agree with ultrasound.  She has history of NAFLD and uncontrolled DM which is persistent, so possibly a worsening of this issue.  She is also on statin, which could explain the rising LFTs.  Would consider holding statin and rechecking LFTs while awaiting ultrasound.

## 2015-08-19 ENCOUNTER — Telehealth: Payer: Self-pay | Admitting: *Deleted

## 2015-08-19 ENCOUNTER — Telehealth: Payer: Self-pay | Admitting: Internal Medicine

## 2015-08-19 NOTE — Telephone Encounter (Signed)
Contacted patient to let her know do to her elevated liver enzymes, I would like her to STOP taking atorvastatin and that we will also order a RUQ ultrasound for her. I was unable to get a hold of the patient and just asked her to call our clinic back.   If patient calls back, please relay the above information to her as far as stopping the atorvastatin and that we will order an ultrasound. I will see her in one month.   Karlene LinemanAlexa Lakai Moree, DO PGY-3 Internal Medicine Resident Pager # [phone] 08/19/2015 11:50 AM

## 2015-08-19 NOTE — Addendum Note (Signed)
Addended by: Karlene LinemanBURNS, ALEXA R on: 08/19/2015 11:48 AM   Modules accepted: Orders

## 2015-08-19 NOTE — Telephone Encounter (Signed)
Pt's sig other called back w/ pt in background, explained that she was to stop atorvastatin, he repeated back to me. Also that the U/S would be scheduled and she would be notified. Also verified to speak with him, [place] at [phone]

## 2015-08-20 NOTE — Telephone Encounter (Signed)
Thank you :)

## 2015-09-13 ENCOUNTER — Ambulatory Visit (HOSPITAL_COMMUNITY)
Admission: RE | Admit: 2015-09-13 | Discharge: 2015-09-13 | Disposition: A | Discharge status: Home / Self Care | Payer: MEDICAID | Source: Ambulatory Visit | Attending: Oncology | Admitting: Oncology

## 2015-09-13 DIAGNOSIS — R932 Abnormal findings on diagnostic imaging of liver and biliary tract: Secondary | ICD-10-CM | POA: Insufficient documentation

## 2015-09-13 DIAGNOSIS — K760 Fatty (change of) liver, not elsewhere classified: Secondary | ICD-10-CM | POA: Insufficient documentation

## 2015-09-18 ENCOUNTER — Encounter: Payer: Self-pay | Admitting: Internal Medicine

## 2015-09-18 ENCOUNTER — Ambulatory Visit (INDEPENDENT_AMBULATORY_CARE_PROVIDER_SITE_OTHER): Payer: Self-pay | Admitting: Internal Medicine

## 2015-09-18 VITALS — BP 113/83 | HR 82 | Temp 98.3°F | Wt 160.0 lb

## 2015-09-18 DIAGNOSIS — K760 Fatty (change of) liver, not elsewhere classified: Secondary | ICD-10-CM

## 2015-09-18 DIAGNOSIS — Z79899 Other long term (current) drug therapy: Secondary | ICD-10-CM

## 2015-09-18 DIAGNOSIS — E785 Hyperlipidemia, unspecified: Secondary | ICD-10-CM

## 2015-09-18 DIAGNOSIS — Z794 Long term (current) use of insulin: Secondary | ICD-10-CM

## 2015-09-18 DIAGNOSIS — I10 Essential (primary) hypertension: Secondary | ICD-10-CM

## 2015-09-18 DIAGNOSIS — E1165 Type 2 diabetes mellitus with hyperglycemia: Secondary | ICD-10-CM

## 2015-09-18 DIAGNOSIS — E118 Type 2 diabetes mellitus with unspecified complications: Secondary | ICD-10-CM

## 2015-09-18 DIAGNOSIS — Z0000 Encounter for general adult medical examination without abnormal findings: Secondary | ICD-10-CM

## 2015-09-18 DIAGNOSIS — Z23 Encounter for immunization: Secondary | ICD-10-CM

## 2015-09-18 MED ORDER — GLIPIZIDE 10 MG PO TABS: 10.0000 mg | ORAL_TABLET | Freq: Every day | ORAL | 11 refills | Status: DC

## 2015-09-18 NOTE — Assessment & Plan Note (Signed)
Patient has a history of NAFLD. In August 2017, LFTs were noted to be more elevated than normal with AST, ALT and ALP in the 100 range. RUQ US revealed hepatic steatosis but no focal lesions identified. Statin was held at previous given concern that atorvastatin may be worsening her liver function.   Plan: -Repeat CMET -Received vaccination for Hepatitis B (1/3, revaccinate at follow up in 1-2 months and then again in 6 months)

## 2015-09-18 NOTE — Progress Notes (Signed)
    CC: T2DM  HPI: [name] is a 45 y.o. female with PMHx of T2DM and HTN who presents to the clinic for follow up for T2DM. Please see problem based assessment and plan for more information.   Patient is eating and drinking well. She denies chest pain or shortness of breath.   Past Medical History:  Diagnosis Date  . Depression   . Diabetes mellitus   . Fatty liver disease, nonalcoholic   . Hypertension   . Sinusitis    Review of Systems: Please see pertinent ROS reviewed in HPI and problem based charting.   Physical Exam: Vitals:   09/18/15 1458  BP: 113/83  Pulse: 82  Temp: 98.3 F (36.8 C)  TempSrc: Oral  SpO2: 100%  Weight: 160 lb (72.6 kg)   General: Vital signs reviewed.  Patient is well-developed and well-nourished, in no acute distress and cooperative with exam.  Cardiovascular: RRR Pulmonary/Chest: Clear to auscultation bilaterally, no wheezes, rales, or rhonchi. Abdominal: Soft, non-tender, non-distended, BS +, no masses.  Extremities: No lower extremity edema bilaterally Skin: Warm, dry and intact.   Assessment & Plan:  See encounters tab for problem based medical decision making. Patient discussed with Dr. Heide SparkNarendra.

## 2015-09-18 NOTE — Assessment & Plan Note (Signed)
Holding atorvastatin given elevated LFTS.

## 2015-09-18 NOTE — Patient Instructions (Signed)
TAKE GLIPIZIDE 10 MG TWICE A DAY. CONTINUE METFORMIN 1000 MG TWICE A DAY. CONTINUE LANTUS 54 UNITS AT NIGHT. CONTINUE NOVOLOG 16 UNITS THREE TIMES A DAY BEFORE MEALS.  FOLLOW UP IN TWO MONTHS.

## 2015-09-18 NOTE — Assessment & Plan Note (Signed)
Lab Results  Component Value Date   HGBA1C 11.4 08/14/2015   HGBA1C 11.0 05/01/2015   HGBA1C 11.1 01/10/2015     Assessment: Diabetes control:  Uncontrolled Progress toward A1C goal:   Stagnant Comments: On Lantus 54 units QHS, Novolog 16 units TID WC, and Metformin 1000 mg BID. Patient was unable to fill rx for glipizide as it was not at her pharmacy. She did not bring her meter today.   Plan: Medications:  continue current medications; added glipizide 10 mg BID Home glucose monitoring: Frequency:  QID Timing:  ACHS Instruction/counseling given: reminded to bring blood glucose meter & log to each visit, reminded to bring medications to each visit, discussed the need for weight loss and discussed diet =Other plans: Follow up in 2 months

## 2015-09-18 NOTE — Assessment & Plan Note (Signed)
BP is 113/83, well controlled. Patient reports compliance with amlodipine 10 mg daily.  Plan: -continue amlodipine 10 mg daily

## 2015-09-19 LAB — CMP14 + ANION GAP
A/G RATIO: 1.1 — AB (ref 1.2–2.2)
ALT: 153 IU/L — ABNORMAL HIGH (ref 0–32)
ANION GAP: 17 mmol/L (ref 10.0–18.0)
AST: 112 IU/L — AB (ref 0–40)
Albumin: 4 g/dL (ref 3.5–5.5)
Alkaline Phosphatase: 169 IU/L — ABNORMAL HIGH (ref 39–117)
BUN/Creatinine Ratio: 19 (ref 9–23)
BUN: 11 mg/dL (ref 6–24)
Bilirubin Total: 0.4 mg/dL (ref 0.0–1.2)
CALCIUM: 9.4 mg/dL (ref 8.7–10.2)
CO2: 22 mmol/L (ref 18–29)
CREATININE: 0.57 mg/dL (ref 0.57–1.00)
Chloride: 101 mmol/L (ref 96–106)
GFR, EST AFRICAN AMERICAN: 131 mL/min/{1.73_m2} (ref >59)
GFR, EST NON AFRICAN AMERICAN: 114 mL/min/{1.73_m2} (ref >59)
GLOBULIN, TOTAL: 3.5 g/dL (ref 1.5–4.5)
Glucose: 246 mg/dL — ABNORMAL HIGH (ref 65–99)
POTASSIUM: 4.3 mmol/L (ref 3.5–5.2)
SODIUM: 140 mmol/L (ref 134–144)
TOTAL PROTEIN: 7.5 g/dL (ref 6.0–8.5)

## 2015-09-19 NOTE — Progress Notes (Signed)
Internal Medicine Clinic Attending  Case discussed with Dr. Burns at the time of the visit.  We reviewed the resident's history and exam and pertinent patient test results.  I agree with the assessment, diagnosis, and plan of care documented in the resident's note.  

## 2015-10-24 ENCOUNTER — Ambulatory Visit (HOSPITAL_COMMUNITY)
Admission: EM | Admit: 2015-10-24 | Discharge: 2015-10-24 | Disposition: A | Discharge status: Nursing Home (Includes ICF) | Payer: Self-pay | Attending: Family Medicine | Admitting: Family Medicine

## 2015-10-24 ENCOUNTER — Emergency Department (HOSPITAL_COMMUNITY): Payer: Self-pay

## 2015-10-24 ENCOUNTER — Ambulatory Visit (INDEPENDENT_AMBULATORY_CARE_PROVIDER_SITE_OTHER): Payer: Self-pay

## 2015-10-24 ENCOUNTER — Encounter (HOSPITAL_COMMUNITY): Payer: Self-pay | Admitting: Emergency Medicine

## 2015-10-24 ENCOUNTER — Encounter (HOSPITAL_COMMUNITY): Payer: Self-pay

## 2015-10-24 ENCOUNTER — Observation Stay (HOSPITAL_COMMUNITY)
Admission: EM | Admit: 2015-10-24 | Discharge: 2015-10-25 | Disposition: A | Discharge status: Home / Self Care | Payer: Self-pay | Attending: Internal Medicine | Admitting: Internal Medicine

## 2015-10-24 DIAGNOSIS — K760 Fatty (change of) liver, not elsewhere classified: Secondary | ICD-10-CM | POA: Diagnosis present

## 2015-10-24 DIAGNOSIS — H538 Other visual disturbances: Secondary | ICD-10-CM | POA: Insufficient documentation

## 2015-10-24 DIAGNOSIS — Z833 Family history of diabetes mellitus: Secondary | ICD-10-CM

## 2015-10-24 DIAGNOSIS — I10 Essential (primary) hypertension: Secondary | ICD-10-CM | POA: Diagnosis present

## 2015-10-24 DIAGNOSIS — R51 Headache: Secondary | ICD-10-CM | POA: Insufficient documentation

## 2015-10-24 DIAGNOSIS — IMO0002 Reserved for concepts with insufficient information to code with codable children: Secondary | ICD-10-CM | POA: Diagnosis present

## 2015-10-24 DIAGNOSIS — F329 Major depressive disorder, single episode, unspecified: Secondary | ICD-10-CM | POA: Insufficient documentation

## 2015-10-24 DIAGNOSIS — R0602 Shortness of breath: Secondary | ICD-10-CM

## 2015-10-24 DIAGNOSIS — R079 Chest pain, unspecified: Secondary | ICD-10-CM

## 2015-10-24 DIAGNOSIS — R61 Generalized hyperhidrosis: Secondary | ICD-10-CM | POA: Insufficient documentation

## 2015-10-24 DIAGNOSIS — E785 Hyperlipidemia, unspecified: Secondary | ICD-10-CM | POA: Diagnosis present

## 2015-10-24 DIAGNOSIS — Z79899 Other long term (current) drug therapy: Secondary | ICD-10-CM

## 2015-10-24 DIAGNOSIS — Z7982 Long term (current) use of aspirin: Secondary | ICD-10-CM | POA: Insufficient documentation

## 2015-10-24 DIAGNOSIS — R112 Nausea with vomiting, unspecified: Secondary | ICD-10-CM | POA: Insufficient documentation

## 2015-10-24 DIAGNOSIS — R0600 Dyspnea, unspecified: Secondary | ICD-10-CM

## 2015-10-24 DIAGNOSIS — Z888 Allergy status to other drugs, medicaments and biological substances status: Secondary | ICD-10-CM | POA: Insufficient documentation

## 2015-10-24 DIAGNOSIS — E1165 Type 2 diabetes mellitus with hyperglycemia: Secondary | ICD-10-CM | POA: Insufficient documentation

## 2015-10-24 DIAGNOSIS — R0789 Other chest pain: Principal | ICD-10-CM | POA: Diagnosis present

## 2015-10-24 DIAGNOSIS — Z8249 Family history of ischemic heart disease and other diseases of the circulatory system: Secondary | ICD-10-CM | POA: Insufficient documentation

## 2015-10-24 DIAGNOSIS — Z794 Long term (current) use of insulin: Secondary | ICD-10-CM | POA: Insufficient documentation

## 2015-10-24 DIAGNOSIS — E119 Type 2 diabetes mellitus without complications: Secondary | ICD-10-CM | POA: Diagnosis present

## 2015-10-24 DIAGNOSIS — R002 Palpitations: Secondary | ICD-10-CM | POA: Insufficient documentation

## 2015-10-24 LAB — BASIC METABOLIC PANEL
Anion gap: 8 (ref 5–15)
BUN: 12 mg/dL (ref 6–20)
CALCIUM: 8.9 mg/dL (ref 8.9–10.3)
CO2: 21 mmol/L — AB (ref 22–32)
CREATININE: 0.51 mg/dL (ref 0.44–1.00)
Chloride: 105 mmol/L (ref 101–111)
GFR calc Af Amer: >60 mL/min (ref >60)
GFR calc non Af Amer: >60 mL/min (ref >60)
GLUCOSE: 288 mg/dL — AB (ref 65–99)
Potassium: 4 mmol/L (ref 3.5–5.1)
Sodium: 134 mmol/L — ABNORMAL LOW (ref 135–145)

## 2015-10-24 LAB — HEPATIC FUNCTION PANEL
ALBUMIN: 3.5 g/dL (ref 3.5–5.0)
ALK PHOS: 118 U/L (ref 38–126)
ALT: 106 U/L — ABNORMAL HIGH (ref 14–54)
AST: 67 U/L — AB (ref 15–41)
BILIRUBIN TOTAL: 0.6 mg/dL (ref 0.3–1.2)
Bilirubin, Direct: 0.1 mg/dL (ref 0.1–0.5)
Indirect Bilirubin: 0.5 mg/dL (ref 0.3–0.9)
TOTAL PROTEIN: 7.4 g/dL (ref 6.5–8.1)

## 2015-10-24 LAB — LIPID PANEL
CHOLESTEROL: 170 mg/dL (ref 0–200)
HDL: 46 mg/dL (ref >40)
LDL Cholesterol: 93 mg/dL (ref 0–99)
Total CHOL/HDL Ratio: 3.7 RATIO
Triglycerides: 154 mg/dL — ABNORMAL HIGH (ref <150)
VLDL: 31 mg/dL (ref 0–40)

## 2015-10-24 LAB — I-STAT TROPONIN, ED: TROPONIN I, POC: 0 ng/mL (ref 0.00–0.08)

## 2015-10-24 LAB — CBC
HCT: 43.2 % (ref 36.0–46.0)
HEMOGLOBIN: 15.6 g/dL — AB (ref 12.0–15.0)
MCH: 30.5 pg (ref 26.0–34.0)
MCHC: 36.1 g/dL — AB (ref 30.0–36.0)
MCV: 84.4 fL (ref 78.0–100.0)
PLATELETS: 222 10*3/uL (ref 150–400)
RBC: 5.12 MIL/uL — ABNORMAL HIGH (ref 3.87–5.11)
RDW: 12.6 % (ref 11.5–15.5)
WBC: 8.5 10*3/uL (ref 4.0–10.5)

## 2015-10-24 LAB — TROPONIN I

## 2015-10-24 LAB — GLUCOSE, CAPILLARY: Glucose-Capillary: 309 mg/dL — ABNORMAL HIGH (ref 65–99)

## 2015-10-24 MED ORDER — ENOXAPARIN SODIUM 40 MG/0.4ML ~~LOC~~ SOLN
40.0000 mg | SUBCUTANEOUS | Status: DC
  Administered 2015-10-24: 40 mg via SUBCUTANEOUS
  Filled 2015-10-24: qty 0.4

## 2015-10-24 MED ORDER — INSULIN ASPART 100 UNIT/ML ~~LOC~~ SOLN
0.0000 [IU] | Freq: Three times a day (TID) | SUBCUTANEOUS | Status: DC
  Administered 2015-10-25: 3 [IU] via SUBCUTANEOUS
  Administered 2015-10-25: 5 [IU] via SUBCUTANEOUS

## 2015-10-24 MED ORDER — SODIUM CHLORIDE 0.9% FLUSH
3.0000 mL | Freq: Two times a day (BID) | INTRAVENOUS | Status: DC
  Administered 2015-10-24 – 2015-10-25 (×2): 3 mL via INTRAVENOUS

## 2015-10-24 MED ORDER — AMLODIPINE BESYLATE 10 MG PO TABS
10.0000 mg | ORAL_TABLET | Freq: Every day | ORAL | Status: DC
  Administered 2015-10-25: 10 mg via ORAL
  Filled 2015-10-24: qty 1

## 2015-10-24 MED ORDER — ASPIRIN 81 MG PO CHEW
324.0000 mg | CHEWABLE_TABLET | Freq: Once | ORAL | Status: AC
  Administered 2015-10-24: 324 mg via ORAL
  Filled 2015-10-24: qty 4

## 2015-10-24 MED ORDER — INSULIN GLARGINE 100 UNIT/ML ~~LOC~~ SOLN
54.0000 [IU] | Freq: Every day | SUBCUTANEOUS | Status: DC
  Administered 2015-10-24: 54 [IU] via SUBCUTANEOUS
  Filled 2015-10-24 (×2): qty 0.54

## 2015-10-24 MED ORDER — ASPIRIN EC 81 MG PO TBEC
81.0000 mg | DELAYED_RELEASE_TABLET | Freq: Every day | ORAL | Status: DC
  Administered 2015-10-25: 81 mg via ORAL
  Filled 2015-10-24: qty 1

## 2015-10-24 MED ORDER — INSULIN ASPART 100 UNIT/ML ~~LOC~~ SOLN
0.0000 [IU] | Freq: Every day | SUBCUTANEOUS | Status: DC
  Administered 2015-10-24: 4 [IU] via SUBCUTANEOUS

## 2015-10-24 MED ORDER — INSULIN ASPART 100 UNIT/ML ~~LOC~~ SOLN
16.0000 [IU] | Freq: Three times a day (TID) | SUBCUTANEOUS | Status: DC
  Administered 2015-10-25: 16 [IU] via SUBCUTANEOUS

## 2015-10-24 NOTE — ED Notes (Signed)
Nurse drawing labs. 

## 2015-10-24 NOTE — ED Triage Notes (Addendum)
Pain in left chest, heavy.  Patient vomited today.  Patient is clammy, describes sweating episodes.  Has not felt like this before Sunday.  Patient reports breathing is different, more difficult.  Reports sob with exertion

## 2015-10-24 NOTE — ED Triage Notes (Signed)
Patient presented to Urgent Care today due to ongoing chest pain that started on Sunday 10/20/15.  Patient reports that pain became much worse today (9/10) and she began sweating, feeling nauseous and vomited. [facility] Patients pain went from a 6/10 to a 4/10 after nitro. BP after was 136/88, HR 91, SpO2 99%.

## 2015-10-24 NOTE — Progress Notes (Signed)
Pt has arrived from ED. Telemetry box applied and CCMD notified. Pt's vitals are stable. Pt reports that she is not experiencing any pain at this time. Pt has been oriented to room. Pt is resting in bed with call light within reach. Pt's husband is at bedside. MD notified of pt's arrival. Will continue current plan of care.   Berdine DanceLauren Moffitt BSN, RN

## 2015-10-24 NOTE — ED Notes (Signed)
[facility] notified 

## 2015-10-24 NOTE — ED Provider Notes (Signed)
[facility]    CSN: [id number] Arrival date & time: 10/24/15  1209     History   Chief Complaint Chief Complaint  Patient presents with  . Palpitations    HPI Robin Klein is a 45 y.o. female.   The history is provided by the patient.  Palpitations  Onset quality:  Sudden Duration:  5 days (onset sun, then repeat wed and today.) Progression:  Worsening Chronicity:  New Relieved by:  None tried Ineffective treatments:  None tried Associated symptoms: chest pain, diaphoresis, nausea, shortness of breath and vomiting   Associated symptoms comment:  Reports chest heaviness.   Past Medical History:  Diagnosis Date  . Depression   . Diabetes mellitus   . Fatty liver disease, nonalcoholic   . Hypertension   . Sinusitis     Patient Active Problem List   Diagnosis Date Noted  . Health care maintenance 06/27/2014  . NAFLD (nonalcoholic fatty liver disease) 78/29/5621  . Hyperlipidemia 01/03/2007  . Essential hypertension, benign 12/23/2005  . Uncontrolled type II diabetes mellitus (HCC) 01/05/1997    Past Surgical History:  Procedure Laterality Date  . CESAREAN SECTION      OB History    Gravida Para Term Preterm AB Living   4 3 3  0 1 3   SAB TAB Ectopic Multiple Live Births   1   0 0         Home Medications    Prior to Admission medications   Medication Sig Start Date End Date Taking? Authorizing Provider  amLODipine (NORVASC) 10 MG tablet Take 1 tablet (10 mg total) by mouth daily. 04/15/15  Yes Alexa Lucrezia Starch, MD  aspirin 81 MG tablet Take 81 mg by mouth daily.   Yes Historical Provider, MD  glipiZIDE (GLUCOTROL) 10 MG tablet Take 1 tablet (10 mg total) by mouth daily before breakfast. 09/18/15  Yes Alexa Lucrezia Starch, MD  metFORMIN (GLUCOPHAGE) 1000 MG tablet Take 1 tablet (1,000 mg total) by mouth 2 (two) times daily with a meal. 05/01/15  Yes Alexa Lucrezia Starch, MD  Prenatal Vit-Fe Fumarate-FA (PRENATAL 1 PLUS 1 PO) Take 1 tablet by mouth.      Yes Historical Provider, MD  Blood Glucose Monitoring Suppl (WAVESENSE PRESTO PRO METER) DEVI Use to check blood sugar up to 2 times daily     Historical Provider, MD  glucose blood (WAVESENSE PRESTO TEST) test strip Use to test blood sugar up to 2 times daily     Historical Provider, MD  insulin aspart (NOVOLOG) 100 UNIT/ML injection Inject 16 Units into the skin 3 (three) times daily with meals. 08/14/15   Servando Snare, MD  Insulin Glargine (LANTUS SOLOSTAR) 100 UNIT/ML Solostar Pen Inject 54 Units into the skin daily at 10 pm. 08/14/15   Alexa Lucrezia Starch, MD  Insulin Syringe-Needle U-100 (GNP INSULIN SYRINGE) 31G X 5/16" 0.5 ML MISC Use to inject insulin once daily     Historical Provider, MD  Lancets MISC Use to check blood sugar as directed     Historical Provider, MD    Family History Family History  Problem Relation Age of Onset  . Diabetes Mother   . Diabetes Father   . Cancer Paternal Grandmother     Social History Social History  Substance Use Topics  . Smoking status: Never Smoker  . Smokeless tobacco: Never Used  . Alcohol use No     Allergies   Lisinopril; Losartan; and Hctz [hydrochlorothiazide]   Review  of Systems Review of Systems  Constitutional: Positive for diaphoresis.  HENT: Negative.   Respiratory: Positive for chest tightness and shortness of breath.   Cardiovascular: Positive for chest pain and palpitations. Negative for leg swelling.  Gastrointestinal: Positive for nausea and vomiting.  All other systems reviewed and are negative.    Physical Exam Triage Vital Signs ED Triage Vitals  Enc Vitals Group     BP 10/24/15 1249 132/81     Pulse Rate 10/24/15 1249 99     Resp 10/24/15 1249 16     Temp 10/24/15 1249 98 F (36.7 C)     Temp Source 10/24/15 1249 Oral     SpO2 10/24/15 1249 100 %     Weight --      Height --      Head Circumference --      Peak Flow --      Pain Score 10/24/15 1307 8     Pain Loc --      Pain Edu? --      Excl. in  GC? --    No data found.   Updated Vital Signs BP 132/81 (BP Location: Left Arm)   Pulse 99   Temp 98 F (36.7 C) (Oral)   Resp 16   SpO2 100%   Visual Acuity Right Eye Distance:   Left Eye Distance:   Bilateral Distance:    Right Eye Near:   Left Eye Near:    Bilateral Near:     Physical Exam  Constitutional: She is oriented to person, place, and time. She appears well-developed and well-nourished.  Neck: Normal range of motion. Neck supple.  Cardiovascular: Normal rate, regular rhythm, normal heart sounds and intact distal pulses.   Pulmonary/Chest: Effort normal and breath sounds normal.  Abdominal: Soft. Bowel sounds are normal.  Lymphadenopathy:    She has no cervical adenopathy.  Neurological: She is alert and oriented to person, place, and time.  Skin: Skin is warm. She is diaphoretic.  Nursing note and vitals reviewed.    UC Treatments / Results  Labs (all labs ordered are listed, but only abnormal results are displayed) Labs Reviewed - No data to display  EKG  EKG Interpretation None      ED ECG REPORT   Date: 10/24/2015  Rate: 91  Rhythm: normal sinus rhythm  QRS Axis: normal  Intervals: normal  ST/T Wave abnormalities: normal  Conduction Disutrbances:none  Narrative Interpretation:   Old EKG Reviewed: none available  I have personally reviewed the EKG tracing and agree with the computerized printout as noted.  Radiology No results found. X-rays reviewed and report per radiologist.  Procedures Procedures (including critical care time)  Medications Ordered in UC Medications - No data to display   Initial Impression / Assessment and Plan / UC Course  I have reviewed the triage vital signs and the nursing notes.  Pertinent labs & imaging results that were available during my care of the patient were reviewed by me and considered in my medical decision making (see chart for details).  Clinical Course    Sent for cardiac eval of cp,  sob, nausea ,palpitations  sudden episodes over past 5days, new with nl ekg, cxr nad..  Final Clinical Impressions(s) / UC Diagnoses   Final diagnoses:  None    New Prescriptions New Prescriptions   No medications on file     Linna HoffJames D Kindl, MD 10/24/15 1357

## 2015-10-24 NOTE — ED Notes (Signed)
[facility] called for report.  Gave them basic information on pt as pt's RN was bedside.  [facility] stated they would be here very soon and get the rest of the report bedside.

## 2015-10-24 NOTE — ED Notes (Signed)
MD at bedside, utilized interpreter to conduct medical assessment.

## 2015-10-24 NOTE — ED Provider Notes (Signed)
[facility]   CSN: [id number] Arrival date & time: 10/24/15  1421     History   Chief Complaint Chief Complaint  Patient presents with  . Chest Pain    HPI Janeece RiggersMaria S Martinez Serrano is a 45 y.o. female.  She has a history of diabetes, hypertension, hyperlipidemia. 4 days ago, she was going shopping and noted a heavy feeling in the left side of her chest with associated dyspnea, nausea, vomiting. That night, she felt like she had a fever but did not take her temperature. Over the next 3 days, she felt generally fatigued and continued to have some dyspnea. This morning, at 11:20 AM, she had another episode of heaviness in the left side of her chest with worsening of the dyspnea and recurrence of nausea. She went to urgent care where she was referred to the ED. She was sent by ambulance who gave her nitroglycerin with significant reduction in symptoms. She continues to have dyspnea but no longer has any chest heaviness. She is a nonsmoker, but there is a strong family history of coronary artery disease. Mother died of a heart attack at age 43.   The history is provided by the patient. A language interpreter was used.    Past Medical History:  Diagnosis Date  . Depression   . Diabetes mellitus   . Fatty liver disease, nonalcoholic   . Hypertension   . Sinusitis     Patient Active Problem List   Diagnosis Date Noted  . Health care maintenance 06/27/2014  . NAFLD (nonalcoholic fatty liver disease) 40/98/119105/26/2011  . Hyperlipidemia 01/03/2007  . Essential hypertension, benign 12/23/2005  . Uncontrolled type II diabetes mellitus (HCC) 01/05/1997    Past Surgical History:  Procedure Laterality Date  . CESAREAN SECTION      OB History    Gravida Para Term Preterm AB Living   4 3 3  0 1 3   SAB TAB Ectopic Multiple Live Births   1   0 0         Home Medications    Prior to Admission medications   Medication Sig Start Date End Date Taking? Authorizing  Provider  amLODipine (NORVASC) 10 MG tablet Take 1 tablet (10 mg total) by mouth daily. 04/15/15  Yes Alexa Lucrezia Starch Burns, MD  aspirin 81 MG tablet Take 81 mg by mouth daily.   Yes Historical Provider, MD  glipiZIDE (GLUCOTROL) 10 MG tablet Take 1 tablet (10 mg total) by mouth daily before breakfast. 09/18/15  Yes Alexa Lucrezia Starch Burns, MD  insulin aspart (NOVOLOG) 100 UNIT/ML injection Inject 16 Units into the skin 3 (three) times daily with meals. 08/14/15  Yes Alexa Lucrezia Starch Burns, MD  Insulin Glargine (LANTUS SOLOSTAR) 100 UNIT/ML Solostar Pen Inject 54 Units into the skin daily at 10 pm. 08/14/15  Yes Alexa Lucrezia Starch Burns, MD  metFORMIN (GLUCOPHAGE) 1000 MG tablet Take 1 tablet (1,000 mg total) by mouth 2 (two) times daily with a meal. 05/01/15  Yes Alexa Lucrezia Starch Burns, MD  Prenatal Vit-Fe Fumarate-FA (PRENATAL 1 PLUS 1 PO) Take 1 tablet by mouth.     Yes Historical Provider, MD  Blood Glucose Monitoring Suppl (WAVESENSE PRESTO PRO METER) DEVI Use to check blood sugar up to 2 times daily     Historical Provider, MD  glucose blood (WAVESENSE PRESTO TEST) test strip Use to test blood sugar up to 2 times daily     Historical Provider, MD  Insulin Syringe-Needle U-100 (GNP INSULIN SYRINGE) 31G X 5/16"  0.5 ML MISC Use to inject insulin once daily     Historical Provider, MD  Lancets MISC Use to check blood sugar as directed     Historical Provider, MD    Family History Family History  Problem Relation Age of Onset  . Diabetes Mother   . Diabetes Father   . Cancer Paternal Grandmother     Social History Social History  Substance Use Topics  . Smoking status: Never Smoker  . Smokeless tobacco: Never Used  . Alcohol use No     Allergies   Lisinopril; Losartan; and Hctz [hydrochlorothiazide]   Review of Systems Review of Systems  All other systems reviewed and are negative.    Physical Exam Updated Vital Signs BP 138/85   Pulse 88   Temp 98.2 F (36.8 C) (Oral)   Resp 18   Ht 5' (1.524 m)   Wt 158 lb (71.7 kg)    LMP 06/30/2015 (Exact Date)   SpO2 100%   BMI 30.86 kg/m   Physical Exam  Nursing note and vitals reviewed.  45 year old female, resting comfortably and in no acute distress. Vital signs are normal. Oxygen saturation is 100%, which is normal. Head is normocephalic and atraumatic. PERRLA, EOMI. Oropharynx is clear. Neck is nontender and supple without adenopathy or JVD. Back is nontender and there is no CVA tenderness. Lungs are clear without rales, wheezes, or rhonchi. Chest is nontender. Heart has regular rate and rhythm without murmur. Abdomen is soft, flat, nontender without masses or hepatosplenomegaly and peristalsis is normoactive. Extremities have no cyanosis or edema, full range of motion is present. Skin is warm and dry without rash. Neurologic: Mental status is normal, cranial nerves are intact, there are no motor or sensory deficits.  ED Treatments / Results  Labs (all labs ordered are listed, but only abnormal results are displayed) Labs Reviewed  BASIC METABOLIC PANEL - Abnormal; Notable for the following:       Result Value   Sodium 134 (*)    CO2 21 (*)    Glucose, Bld 288 (*)    All other components within normal limits  CBC - Abnormal; Notable for the following:    RBC 5.12 (*)    Hemoglobin 15.6 (*)    MCHC 36.1 (*)    All other components within normal limits  I-STAT TROPOININ, ED    EKG  EKG Interpretation  Date/Time:  Thursday October 24 2015 14:35:32 EDT Ventricular Rate:  87 PR Interval:    QRS Duration: 88 QT Interval:  358 QTC Calculation: 431 R Axis:   32 Text Interpretation:  Sinus rhythm Baseline wander in lead(s) II III aVF No significant change since last tracing Confirmed by Ethelda Chick  MD, SAM [phone]) on 10/24/2015 2:42:45 PM       Radiology Dg Chest 2 View  Result Date: 10/24/2015 CLINICAL DATA:  Chest pain on the left, some shortness of breath over the last 3-4 days EXAM: CHEST  2 VIEW COMPARISON:  Chest x-ray of 10/24/2015  FINDINGS: No active infiltrate or effusion is seen. Mediastinal and hilar contours are unremarkable. The heart is within normal limits in size. No bony abnormality is seen. IMPRESSION: No active cardiopulmonary disease. Electronically Signed   By: Dwyane Dee M.D.   On: 10/24/2015 16:13   Dg Chest 2 View  Result Date: 10/24/2015 CLINICAL DATA:  Shortness of breath. EXAM: CHEST  2 VIEW COMPARISON:  05/29/2015. FINDINGS: Mediastinum and hilar structures are normal. Heart size normal. Mild bibasilar  subsegmental atelectasis. No pleural effusion or pneumothorax. IMPRESSION: Low lung volumes with mild bibasilar atelectasis. Exam otherwise unremarkable. Electronically Signed   By: Maisie Fus  Register   On: 10/24/2015 13:34    Procedures Procedures (including critical care time)  Medications Ordered in ED Medications  aspirin chewable tablet 324 mg (not administered)     Initial Impression / Assessment and Plan / ED Course  I have reviewed the triage vital signs and the nursing notes.  Pertinent labs & imaging results that were available during my care of the patient were reviewed by me and considered in my medical decision making (see chart for details).  Clinical Course   Chest discomfort which is strongly suspicious of angina pectoris in patient with significant risk factors. Although her heart score is 3, I feel that she is at a higher risk than is explained just by the heart score. Old records are reviewed, and she has no prior visits for cardiac related issues. Initial ECG, chest x-ray, troponin are all normal. Will admit for serial troponins and consideration for cardiology consultation.Case is discussed with Dr. Karma Greaser of internal medicine teaching service who agrees to admit the patient under observation status.  Final Clinical Impressions(s) / ED Diagnoses   Final diagnoses:  Chest discomfort  Dyspnea, unspecified type    New Prescriptions New Prescriptions   No medications on  file     Dione Booze, MD 10/24/15 1708

## 2015-10-24 NOTE — H&P (Signed)
Date: 10/24/2015               Patient Name:  Robin Klein MRN: [id number]  DOB: 11/27/1970 Age / Sex: 45 y.o., female   PCP: Servando Snare, MD         Medical Service: Internal Medicine Teaching Service         Attending Physician: Dr. Earl Lagos, MD    First Contact: Dr. Thomasene Lot Pager: [phone]  Second Contact: Dr. Valentino Nose Pager: [phone]       After Hours (After 5p/  First Contact Pager: [phone]  weekends / holidays): Second Contact Pager: [phone]   Chief Complaint: Chest heaviness  History of Present Illness: Robin Klein is a 45 year old female with PMHx of Type II Diabetes on insulin, hyperlipidemia, and hypertension that presents to the ED for chest heaviness that started this morning at 11 am when leaving the store.  He stated the heaviness was felt along the left sternal boarder. Associated symptoms included heart palpitations, shortness of breath,  mild blurry vision, nausea and one episode of vomiting.  She denied chest pain but states she does experience pain in her left upper arm. She went to urgent care and was referred to the ED.  She received nitroglycerin in the ED and stated that her chest discomfort had been relieved significantly.          First episode of symptoms occurred on Sunday 10/15 at 2am after intercourse.  Her symptoms included heart palpitations and difficulty breathing.  They lasted for 2 hours and was relieved by rest and tylenol.  The following day she had another episode around noon while sitting where she had difficulty breathing, chest heaviness and this time was diaphoretic and had a headache.  She took ibuprofen and the episode resolved after one hour.  With each episode she becomes increasingly fatigued. She stated that today's episode of symptoms had been worse and prompted her to seek medical attention.  She states that she has never had these symptoms prior to Sunday and that the symptoms can occur  spontaneously.  She denies any recent stressors or changes in the past month.    Meds:  Current Meds  Medication Sig  . amLODipine (NORVASC) 10 MG tablet Take 1 tablet (10 mg total) by mouth daily.  Marland Kitchen aspirin 81 MG tablet Take 81 mg by mouth daily.  Marland Kitchen glipiZIDE (GLUCOTROL) 10 MG tablet Take 1 tablet (10 mg total) by mouth daily before breakfast.  . insulin aspart (NOVOLOG) 100 UNIT/ML injection Inject 16 Units into the skin 3 (three) times daily with meals.  . Insulin Glargine (LANTUS SOLOSTAR) 100 UNIT/ML Solostar Pen Inject 54 Units into the skin daily at 10 pm.  . metFORMIN (GLUCOPHAGE) 1000 MG tablet Take 1 tablet (1,000 mg total) by mouth 2 (two) times daily with a meal.  . Prenatal Vit-Fe Fumarate-FA (PRENATAL 1 PLUS 1 PO) Take 1 tablet by mouth.       Allergies: Allergies as of 10/24/2015 - Review Complete 10/24/2015  Allergen Reaction Noted  . Lisinopril Swelling 01/10/2015  . Losartan Nausea And Vomiting 01/10/2015  . Hctz [hydrochlorothiazide] Rash 04/28/2012   Past Medical History:  Diagnosis Date  . Depression   . Diabetes mellitus   . Fatty liver disease, nonalcoholic   . Hypertension   . Sinusitis     Family History: Mother: died of myocardial infarction at 84 also had diabetes and hypertension,  Father:  Myocardial  infarction 4273  Social History: Tobacco use: denies Alcohol use: denies Illicit drug use: denies  Review of Systems: A complete ROS was negative except as per HPI.   Physical Exam: Blood pressure 125/80, pulse 86, temperature 98 F (36.7 C), temperature source Oral, resp. rate 20, height 5' (1.524 m), weight 158 lb (71.7 kg), last menstrual period 06/30/2015, SpO2 98 %. Vitals:   10/24/15 1715 10/24/15 1800 10/24/15 1852 10/24/15 2050  BP: 132/93 130/96 125/80 120/84  Pulse: 92 89 86 87  Resp: 14 20  20   Temp:   98 F (36.7 C) 98.5 F (36.9 C)  TempSrc:   Oral Oral  SpO2: 99% 98% 98% 99%  Weight:      Height:       General: Vital signs  reviewed.  Patient is well-developed and well-nourished, in no acute distress and cooperative with exam.  Head: Normocephalic and atraumatic. Eyes: EOMI, conjunctivae normal, no scleral icterus.  Neck: Supple, trachea midline, normal ROM Cardiovascular: RRR, S1 normal, S2 normal, no murmurs, gallops, or rubs. Pulmonary/Chest: Clear to auscultation bilaterally, no wheezes, rales, or rhonchi. Abdominal: Soft, non-tender, non-distended, BS +, no masses, organomegaly, or guarding present.  Musculoskeletal: No joint deformities, erythema, or stiffness, ROM full and nontender. Extremities: No lower extremity edema bilaterally,  pulses symmetric and intact bilaterally. No cyanosis or clubbing. Neurological: A&O x3, Strength is normal and symmetric bilaterally, no focal motor deficit, sensory intact to light touch bilaterally.  Skin: Warm, dry and intact. No rashes or erythema. Psychiatric: Normal mood and affect. speech and behavior is normal. Cognition and memory are normal.   BMET    Component Value Date/Time   NA 134 (L) 10/24/2015 1540   NA 140 09/18/2015 1531   K 4.0 10/24/2015 1540   CL 105 10/24/2015 1540   CO2 21 (L) 10/24/2015 1540   GLUCOSE 288 (H) 10/24/2015 1540   BUN 12 10/24/2015 1540   BUN 11 09/18/2015 1531   CREATININE 0.51 10/24/2015 1540   CREATININE 0.58 12/14/2013 0926   CALCIUM 8.9 10/24/2015 1540   GFRNONAA >60 10/24/2015 1540   GFRNONAA >89 12/14/2013 0926   GFRAA >60 10/24/2015 1540   GFRAA >89 12/14/2013 0926   CBC    Component Value Date/Time   WBC 8.5 10/24/2015 1540   RBC 5.12 (H) 10/24/2015 1540   HGB 15.6 (H) 10/24/2015 1540   HCT 43.2 10/24/2015 1540   HCT 41.9 08/14/2015 1557   PLT 222 10/24/2015 1540   PLT 285 08/14/2015 1557   MCV 84.4 10/24/2015 1540   MCV 87 08/14/2015 1557   MCH 30.5 10/24/2015 1540   MCHC 36.1 (H) 10/24/2015 1540   RDW 12.6 10/24/2015 1540   RDW 14.3 08/14/2015 1557   LYMPHSABS 3.3 11/24/2011 1524   MONOABS 0.7  11/24/2011 1524   EOSABS 0.5 11/24/2011 1524   BASOSABS 0.1 11/24/2011 1524    EKG: Normal sinus rhtyhm, no ST elevations or T wave inversions  CXR:  FINDINGS: No active infiltrate or effusion is seen. Mediastinal and hilar contours are unremarkable. The heart is within normal limits in size. No bony abnormality is seen.  IMPRESSION: No active cardiopulmonary disease.  Assessment & Plan by Problem: Active Problems:  Chest discomfort Patient has several risk factors for ACS including HLD, HTN, DMII and family hx of MI in both parents.  Patient's EKG showed no evidence for acute MI.  First set of troponins were negative.  We will continue to trend.  Etiology is not clear but with  her past medical history and current symtoms a stress test would better help assess for coronary heart disease.  She is NPO at midnight for stress test.  Other causes for her chest discomfort could be anxiety or thyroid dysfunction.    -Trend troponin - EKG in the morning - BMP - aspirin - telemetry  - Stress test  Diabetes type II Patient has poorly controlled diabetes.  Her last Hgb A1c was 11.4 on 08/14/15. Patient is taking glipizide 10mg  once daily at supper, metformin 1000mg  BID, novolog 16units TID with meals and Lantus 54 at night.  - novolog 16units TID with meals - Lantus 54 units at bedtime - SSI  Hyperlipidemia  Patient was switched from rosuvastatin 5mg  to atorvastatin 20mg  but was held due to mildly elevated LFTs in august 2017.  She has not restarted her atorvastatin.  We obtained a lipid panel and LDL 93 and Triglycerides 154.  Would consider restarting atorvastatin and possibly adding fenofibrate. - Consider starting statin and fenofibrate  NAFLD Patients LFTs were noted to be more elevated than normal in August 2017 and her statin medication was held.  RUQ [place] on 09/13/15 showed hepatic steatosis.    Hypertension Blood pressure is stable and at goal.  Patient takes amlodipine 10mg   daily -Continue home blood pressure medications  Diet:  NPO at midnight Code status:  Full DVT prophylaxis:  Lovenox SubQ  Dispo: Admit patient to Observation with expected length of stay less than 2 midnights.  Signed: Camelia Phenes, DO 10/24/2015, 7:18 PM  Pager: [phone]

## 2015-10-25 ENCOUNTER — Encounter (HOSPITAL_COMMUNITY): Payer: Self-pay | Admitting: Cardiology

## 2015-10-25 ENCOUNTER — Observation Stay (HOSPITAL_BASED_OUTPATIENT_CLINIC_OR_DEPARTMENT_OTHER): Payer: Self-pay

## 2015-10-25 ENCOUNTER — Observation Stay (HOSPITAL_COMMUNITY): Payer: Self-pay

## 2015-10-25 ENCOUNTER — Telehealth: Payer: Self-pay

## 2015-10-25 DIAGNOSIS — R079 Chest pain, unspecified: Secondary | ICD-10-CM

## 2015-10-25 DIAGNOSIS — R072 Precordial pain: Secondary | ICD-10-CM

## 2015-10-25 DIAGNOSIS — R0789 Other chest pain: Secondary | ICD-10-CM

## 2015-10-25 DIAGNOSIS — R071 Chest pain on breathing: Secondary | ICD-10-CM

## 2015-10-25 LAB — NM MYOCAR MULTI W/SPECT W/WALL MOTION / EF
CHL CUP MPHR: 177 {beats}/min
CHL CUP NUCLEAR SDS: 2
CHL CUP NUCLEAR SSS: 4
CHL CUP STRESS STAGE 1 GRADE: 0 %
CHL CUP STRESS STAGE 1 HR: 81 {beats}/min
CHL CUP STRESS STAGE 1 SBP: 123 mmHg
CHL CUP STRESS STAGE 1 SPEED: 0 mph
CHL CUP STRESS STAGE 2 GRADE: 0 %
CHL CUP STRESS STAGE 2 HR: 81 {beats}/min
CHL CUP STRESS STAGE 2 SPEED: 0 mph
CHL CUP STRESS STAGE 3 DBP: 91 mmHg
CHL CUP STRESS STAGE 4 SPEED: 0 mph
CHL CUP STRESS STAGE 5 GRADE: 0 %
CHL CUP STRESS STAGE 5 SPEED: 0 mph
CSEPEW: 1 METS
CSEPHR: 67 %
Exercise duration (min): 0 min
Exercise duration (sec): 0 s
LHR: 0.36
LV sys vol: 37 mL
LVDIAVOL: 85 mL (ref 46–106)
Peak HR: 96 {beats}/min
Percent of predicted max HR: 54 %
RPE: 0
Rest HR: 83 {beats}/min
SRS: 2
Stage 1 DBP: 84 mmHg
Stage 3 Grade: 0 %
Stage 3 HR: 103 {beats}/min
Stage 3 SBP: 135 mmHg
Stage 3 Speed: 0 mph
Stage 4 DBP: 88 mmHg
Stage 4 Grade: 0 %
Stage 4 HR: 96 {beats}/min
Stage 4 SBP: 130 mmHg
Stage 5 HR: 96 {beats}/min
TID: 1.19

## 2015-10-25 LAB — BASIC METABOLIC PANEL
ANION GAP: 5 (ref 5–15)
BUN: 12 mg/dL (ref 6–20)
CALCIUM: 8.6 mg/dL — AB (ref 8.9–10.3)
CO2: 24 mmol/L (ref 22–32)
Chloride: 107 mmol/L (ref 101–111)
Creatinine, Ser: 0.57 mg/dL (ref 0.44–1.00)
GFR calc Af Amer: >60 mL/min (ref >60)
GFR calc non Af Amer: >60 mL/min (ref >60)
GLUCOSE: 239 mg/dL — AB (ref 65–99)
Potassium: 3.8 mmol/L (ref 3.5–5.1)
Sodium: 136 mmol/L (ref 135–145)

## 2015-10-25 LAB — HIV ANTIBODY (ROUTINE TESTING W REFLEX): HIV Screen 4th Generation wRfx: NONREACTIVE

## 2015-10-25 LAB — GLUCOSE, CAPILLARY
Glucose-Capillary: 240 mg/dL — ABNORMAL HIGH (ref 65–99)
Glucose-Capillary: 291 mg/dL — ABNORMAL HIGH (ref 65–99)
Glucose-Capillary: 223 mg/dL — ABNORMAL HIGH (ref 65–99)

## 2015-10-25 LAB — HEMOGLOBIN A1C
HEMOGLOBIN A1C: 11 % — AB (ref 4.8–5.6)
MEAN PLASMA GLUCOSE: 269 mg/dL

## 2015-10-25 LAB — TROPONIN I: Troponin I: <0.03 ng/mL (ref <0.03)

## 2015-10-25 MED ORDER — TECHNETIUM TC 99M TETROFOSMIN IV KIT
10.0000 | PACK | Freq: Once | INTRAVENOUS | Status: AC | PRN
  Administered 2015-10-25: 10 via INTRAVENOUS

## 2015-10-25 MED ORDER — REGADENOSON 0.4 MG/5ML IV SOLN
INTRAVENOUS | Status: AC
  Filled 2015-10-25: qty 5

## 2015-10-25 MED ORDER — ATORVASTATIN CALCIUM 20 MG PO TABS: 20.0000 mg | ORAL_TABLET | Freq: Every day | ORAL | 2 refills | Status: DC

## 2015-10-25 MED ORDER — TECHNETIUM TC 99M TETROFOSMIN IV KIT
30.0000 | PACK | Freq: Once | INTRAVENOUS | Status: AC | PRN
  Administered 2015-10-25: 30 via INTRAVENOUS

## 2015-10-25 MED ORDER — REGADENOSON 0.4 MG/5ML IV SOLN
0.4000 mg | Freq: Once | INTRAVENOUS | Status: AC
  Administered 2015-10-25: 0.4 mg via INTRAVENOUS
  Filled 2015-10-25: qty 5

## 2015-10-25 NOTE — Consult Note (Signed)
Cardiology Consult    Patient ID: Robin Klein MRN: [id number], DOB/AGE: 43/04/1970   Admit date: 10/24/2015 Date of Consult: 10/25/2015  Primary Physician: Servando SnareBurns, Alexa R, MD Reason for Consult: Chest pain  Primary Cardiologist: New Requesting Provider: Dr. Lawerance BachBurns  Patient Profile    [name] is a 45 year old female with a past medical history of Type II diabetes on insulin, hyperlipidemia, and hypertension. She presented with chest pain on 10/24/15.   History of Present Illness  [name] presented to the ED on 10/24/15 for chest heaviness that began as she was leaving a store. The heaviness was felt along the left sternal boarder. Associated symptoms included heart palpitations, shortness of breath,  mild blurry vision, nausea and one episode of vomiting.  She denied chest pain but states she does experience pain in her left upper arm. She went to urgent care and was referred to the ED.  She received nitroglycerin in the ED and her symptoms were relieved.  She also had the same symptoms on Sunday 10/20/15 with heart palpitations and difficulty breathing. This lasted for 2 hours and was relieved by rest and tylenol.  The following day she had another episode around noon while sitting, she had difficulty breathing, chest heaviness and this time was diaphoretic and had a headache.  She took ibuprofen and the episode resolved after one hour.  With each episode she becomes increasingly fatigued. She stated that the episode that occurred on the day of admission was the most severe and is what prompted her to get medical attention.    At the time of my encounter she is chest pain free. EKG shows NSR with no acute ST/T wave changes. Her troponin is negative x 2.   She has a family history of CAD, her mother and her father both had MI's. She is not a smoker, denies ETOH use.   Past Medical History   Past Medical History:  Diagnosis Date  . Depression   .  Diabetes mellitus   . Fatty liver disease, nonalcoholic   . Hypertension   . Sinusitis     Past Surgical History:  Procedure Laterality Date  . CESAREAN SECTION       Allergies  Allergies  Allergen Reactions  . Lisinopril Swelling  . Losartan Nausea And Vomiting  . Hctz [Hydrochlorothiazide] Rash    Inpatient Medications    . amLODipine  10 mg Oral Daily  . aspirin EC  81 mg Oral Daily  . enoxaparin (LOVENOX) injection  40 mg Subcutaneous Q24H  . insulin aspart  0-5 Units Subcutaneous QHS  . insulin aspart  0-9 Units Subcutaneous TID WC  . insulin aspart  16 Units Subcutaneous TID WC  . insulin glargine  54 Units Subcutaneous Q2200  . regadenoson      . sodium chloride flush  3 mL Intravenous Q12H    Family History    Family History  Problem Relation Age of Onset  . Diabetes Mother   . CAD Mother   . Diabetes Father   . CAD Father   . Cancer Paternal Grandmother     Social History    Social History   Social History  . Marital status: Married    Spouse name: N/A  . Number of children: N/A  . Years of education: 7   Occupational History  .  Unemployed   Social History Main Topics  . Smoking status: Never Smoker  . Smokeless tobacco: Never  Used  . Alcohol use No  . Drug use: No  . Sexual activity: Not on file   Other Topics Concern  . Not on file   Social History Narrative   Financial assistance approved for 100% discount at [facility] and has [facility] card per [name]   12/16/2009   Recently gave birth to a son 04/2009.     Review of Systems    General:  No chills, fever, night sweats or weight changes.  Cardiovascular:  No chest pain, dyspnea on exertion, edema, orthopnea, palpitations, paroxysmal nocturnal dyspnea. Dermatological: No rash, lesions/masses Respiratory: No cough, dyspnea Urologic: No hematuria, dysuria Abdominal:   No nausea, vomiting, diarrhea, bright red blood per rectum, melena, or hematemesis Neurologic:  No visual changes,  wkns, changes in mental status. All other systems reviewed and are otherwise negative except as noted above.  Physical Exam    Blood pressure 130/88, pulse 82, temperature 97.8 F (36.6 C), temperature source Oral, resp. rate 18, height 5' (1.524 m), weight 158 lb (71.7 kg), last menstrual period 06/30/2015, SpO2 97 %.  General: Pleasant, NAD Psych: Normal affect. Neuro: Alert and oriented X 3. Moves all extremities spontaneously. HEENT: Normal  Neck: Supple without bruits or JVD. Lungs:  Resp regular and unlabored, CTA. Heart: RRR no s3, s4, or murmurs. Abdomen: Soft, non-tender, non-distended, BS + x 4.  Extremities: No clubbing, cyanosis or edema. DP/PT/Radials 2+ and equal bilaterally.  Labs    Troponin [facility] of Care Test)  Recent Labs  10/24/15 1548  TROPIPOC 0.00    Recent Labs  10/24/15 2225 10/25/15 0322  TROPONINI <0.03 <0.03   Lab Results  Component Value Date   WBC 8.5 10/24/2015   HGB 15.6 (H) 10/24/2015   HCT 43.2 10/24/2015   MCV 84.4 10/24/2015   PLT 222 10/24/2015     Recent Labs Lab 10/24/15 2225 10/25/15 0322  NA  --  136  K  --  3.8  CL  --  107  CO2  --  24  BUN  --  12  CREATININE  --  0.57  CALCIUM  --  8.6*  PROT 7.4  --   BILITOT 0.6  --   ALKPHOS 118  --   ALT 106*  --   AST 67*  --   GLUCOSE  --  239*   Lab Results  Component Value Date   CHOL 170 10/24/2015   HDL 46 10/24/2015   LDLCALC 93 10/24/2015   TRIG 154 (H) 10/24/2015   No results found for: [facility]   Radiology Studies    Dg Chest 2 View  Result Date: 10/24/2015 CLINICAL DATA:  Chest pain on the left, some shortness of breath over the last 3-4 days EXAM: CHEST  2 VIEW COMPARISON:  Chest x-ray of 10/24/2015 FINDINGS: No active infiltrate or effusion is seen. Mediastinal and hilar contours are unremarkable. The heart is within normal limits in size. No bony abnormality is seen. IMPRESSION: No active cardiopulmonary disease. Electronically Signed   By: Dwyane Dee M.D.   On: 10/24/2015 16:13   Dg Chest 2 View  Result Date: 10/24/2015 CLINICAL DATA:  Shortness of breath. EXAM: CHEST  2 VIEW COMPARISON:  05/29/2015. FINDINGS: Mediastinum and hilar structures are normal. Heart size normal. Mild bibasilar subsegmental atelectasis. No pleural effusion or pneumothorax. IMPRESSION: Low lung volumes with mild bibasilar atelectasis. Exam otherwise unremarkable. Electronically Signed   By: Maisie Fus  Register   On: 10/24/2015 13:34    EKG & Cardiac  Imaging    EKG: NSR    Assessment & Plan    1. Chest pain with moderate risk for cardiac etiology: Patient presents with intermittent episodes of chest pain associated with activity. Troponin is negative and EKG shows no acute changes concerning for ischemia. She has completed a Lexiscan Myoview this morning which was ordered by the primary team. We will follow results.   She has risk factors for CAD including family history, HLD and DM. Chest pain free currently.   2. HLD: Patient was switched from rosuvastatin 5mg  to atorvastatin 20mg  but was held due to mildly elevated LFTs in August 2017.  She has not restarted her atorvastatin. Lipid panel this admission - LDL 93 and Triglycerides 154.  Would restart atorvastatin 20mg .   3. HTN: Stable on current regimen.      Signed, Little Ishikawa, NP 10/25/2015, 10:57 AM Pager: [phone]  I have personally seen and examined this patient with Suzzette Righter, NP. I agree with the assessment and plan as outlined above. She has risk factors for CAD including DM, HTN, HLD. She has chest pain that has some typical features. EKG shows no ischemic changes. Troponin is negative. My exam shows a well developed female in NAD, CV:RRR, no murmurs. Lungs: clear bilat Abd: soft, NT. Ext: no edema. Labs reviewed. EKG reviewed by me.  Agree with nuclear stress test. If no ischemia, can be discharged. If there is ischemia, will need cath.   Verne Carrow 10/25/2015 11:23  AM

## 2015-10-25 NOTE — Progress Notes (Signed)
Pt has been discharged home with family. IV and telemetry box were removed. Pt received discharge instructions and all questions were answered. An interpreter was used. Pt verbalized understanding. Pt left with all of her belongings. Pt left the unit via ambulation and was accompanied by her husband. Pt was in no distress at time of discharge.   Berdine DanceLauren Moffitt BSN, RN

## 2015-10-25 NOTE — Progress Notes (Signed)
Stress portion of Lexiscan completed, Nuc results will be later this PM.

## 2015-10-25 NOTE — Progress Notes (Signed)
Normal stress test

## 2015-10-25 NOTE — Progress Notes (Signed)
I spoke with the patient this afternoon about possibly leaving this evening if her stress test was normal. She stated that she would like to get home to her family and would have a ride. I discussed this with the night team. If her cardiac stress test comes back as low risk she will be able to be discharged home. However, if it is intermediate or high risk she will need to stay for heart catheterization.

## 2015-10-25 NOTE — Progress Notes (Signed)
   Subjective: No acute events overnight. Patient's chest pain has resolved. She had a little bit of nausea during her test this morning but otherwise feels fine. She denied shortness of breath, nausea or vomiting. She had no additional questions this morning.  Objective:  Vital signs in last 24 hours: Vitals:   10/25/15 0953 10/25/15 0955 10/25/15 0956 10/25/15 1314  BP: (!) 138/94 (!) 135/91 130/88 111/77  Pulse:    86  Resp:    19  Temp:    98.1 F (36.7 C)  TempSrc:    Oral  SpO2:    99%  Weight:      Height:       Physical Exam  Constitutional: She is oriented to person, place, and time. She appears well-developed and well-nourished.  Resting comfortably sitting in a chair.  HENT:  Head: Normocephalic and atraumatic.  Cardiovascular: Normal rate and regular rhythm.  Exam reveals no gallop and no friction rub.   No murmur heard. Respiratory: Effort normal and breath sounds normal. No respiratory distress. She has no wheezes. She has no rales.  GI: Soft. Bowel sounds are normal. She exhibits no distension. There is no tenderness.  Musculoskeletal: She exhibits no edema.  Neurological: She is alert and oriented to person, place, and time.     Assessment/Plan: 1. Chest discomfort resolved  The patient's chest pain is resolved. Her troponins remained negative. EKG without evidence of acute ST segment elevation. The patient had anuclear cardiac stress test this morning. Additionally, she was evaluated by cardiology. If this test does not show evidence of ischemia she will be able to discharge home this afternoon. However, if there is evidence of ischemia patient will need cardiac catheterization and she will not be discharged. We'll follow-up the results of the study and recommendations by cardiology.  2. Diabetes type II Patient has poorly controlled diabetes.  Her last Hgb A1c was 11.4 on 08/14/15. Patient is taking glipizide 10mg  once daily at supper, metformin 1000mg  BID,  novolog 16units TID with meals and Lantus 54 at night.  - novolog 16units TID with meals - Lantus 54 units at bedtime - SSI  3. Hyperlipidemia  Patient was switched from rosuvastatin 5mg  to atorvastatin 20mg  but was held due to mildly elevated LFTs in august 2017.  She has not restarted her atorvastatin.  We obtained a lipid panel and LDL 93 and Triglycerides 154.  Would consider restarting atorvastatin and possibly adding fenofibrate. - Consider starting statin and fenofibrate  4. NAFLD Patients LFTs were noted to be more elevated than normal in August 2017 and her statin medication was held.  RUQ US on 09/13/15 showed hepatic steatosis.    5. Hypertension Blood pressure is stable and at goal.  Patient takes amlodipine 10mg  daily -Continue home blood pressure medications  FEN/GI: Normal diet DVT prophylaxis:  Lovenox SubQ  Dispo: Anticipated discharge this afternoon or tomorrow pending test results.   Thomasene LotJames Brittan Butterbaugh, MD 10/25/2015, 1:28 PM Pager: [phone]

## 2015-10-25 NOTE — Discharge Instructions (Signed)
[name],  All of your blood work and tests have come back showing that it was not your heart causing your chest pain. I would like you to go back to taking the Lipitor 20 mg daily. Please follow up in our clinic next week. I have scheduled you an appointment for October 27th at 9:45 AM. Please continue to the rest of your medications as prescribed. If you have any questions or concerns, please call the clinic at [phone].

## 2015-10-26 NOTE — Discharge Summary (Signed)
Name: Robin Klein MRN: [id number] DOB: 01-20-1970 45 y.o. PCP: Servando Snare, MD  Date of Admission: 10/24/2015  2:30 PM Date of Discharge: 10/26/2015 Attending Physician: No att. providers found  Discharge Diagnosis: 1. Chest pressure   Discharge Medications:   Medication List    TAKE these medications   amLODipine 10 MG tablet Commonly known as:  NORVASC Take 1 tablet (10 mg total) by mouth daily.   aspirin 81 MG tablet Take 81 mg by mouth daily.   atorvastatin 20 MG tablet Commonly known as:  LIPITOR Take 1 tablet (20 mg total) by mouth daily.   glipiZIDE 10 MG tablet Commonly known as:  GLUCOTROL Take 1 tablet (10 mg total) by mouth daily before breakfast.   GNP INSULIN SYRINGE 31G X 5/16" 0.5 ML Misc Generic drug:  Insulin Syringe-Needle U-100 Use to inject insulin once daily   insulin aspart 100 UNIT/ML injection Commonly known as:  NOVOLOG Inject 16 Units into the skin 3 (three) times daily with meals.   Insulin Glargine 100 UNIT/ML Solostar Pen Commonly known as:  LANTUS SOLOSTAR Inject 54 Units into the skin daily at 10 pm.   Lancets Misc Use to check blood sugar as directed   metFORMIN 1000 MG tablet Commonly known as:  GLUCOPHAGE Take 1 tablet (1,000 mg total) by mouth 2 (two) times daily with a meal.   PRENATAL 1 PLUS 1 PO Take 1 tablet by mouth.   WAVESENSE PRESTO PRO METER Devi Use to check blood sugar up to 2 times daily   WAVESENSE PRESTO TEST test strip Generic drug:  glucose blood Use to test blood sugar up to 2 times daily       Disposition and follow-up:   [name] was discharged from [facility] in Good condition.  At the hospital follow up visit please address:  1.  Continue your medications as prescribed.  2.  Labs / imaging needed at time of follow-up: None  3.  Pending labs/ test needing follow-up: None  Follow-up Appointments: Follow-up Information    Servando Snare,  MD Follow up on 11/01/2015.   Specialty:  Internal Medicine Why:  9:45 AM Contact information: [address] [place] [address] [phone]           Hospital Course by problem list:   1. Chest pressure Patient presented to the [facility] emergency department on 10/24/2015 with chest heaviness that started that morning around 11 AM. She stated the heaviness was felt along the left sternal border. Associated with this pain the patient had palpitations, shortness of breath, mild blurry vision, nausea and a single episode of nonbloody, nonbilious emesis. In the emergency department the patient denied any chest pain but did endorse pain in her left upper arm.  In the emergency department the patient was afebrile and hemodynamically stable. Labs were significant for a sodium of 134, glucose of 288 and hemoglobin of 15.6. EKG did not show any acute ST segment elevation. I-STAT troponin was negative. A chest x-ray was obtained which showed no acute cardiopulmonary disease. She was then admitted to the [facility] internal medicine teaching service for further workup and acute coronary syndrome rule out.  Once on service the patient's symptoms continued to improve. Importantly, she has an extensive family history of coronary disease including myocardial infarction in both her mother and father. Additionally, the patient has a history of poorly controlled diabetes, hypertension and other vascular risk factors. While inpatient  she was evaluated by cardiology and a nuclear stress test was ordered. This test was normal and did not show areas of ischemia. Troponins were trended which were negative 3. A repeat EKG again did not demonstrate acute ST segment elevation. Over the course of the day the patient's symptoms entirely resolved. The exact etiology of the patient's chest pressure was not elucidated but based on normal EKG, normal troponins and negative stress test it does not appear that this was  caused by cardiac ischemia. On the day of discharge the patient was afebrile, hemodynamically stable, symptom-free and medically appropriate for discharge.   Discharge Vitals:   BP 111/77 (BP Location: Left Arm)   Pulse 86   Temp 98.1 F (36.7 C) (Oral)   Resp 19   Ht 5' (1.524 m)   Wt 158 lb (71.7 kg)   LMP 06/30/2015 (Exact Date)   SpO2 99%   BMI 30.86 kg/m   Pertinent Labs, Studies, and Procedures: 1. Chest x-ray-no acute cardiopulmonary abnormality 2. Troponins negative 3 3. EKG on admission-no acute ST segment elevation 4. Nuclear stress test-no areas of ischemia 5. EKG on discharge-no ST segment elevation 6. HIV antibody-nonreactive  Discharge Instructions: Discharge Instructions    Diet - low sodium heart healthy    Complete by:  As directed    Discharge instructions    Complete by:  As directed    Please continue to take all of your medications. Please follow-up with your doctor when able.   Increase activity slowly    Complete by:  As directed       Signed: Thomasene LotJames Shams Fill, MD 10/26/2015, 7:36 AM   Pager: [phone]

## 2015-11-01 ENCOUNTER — Ambulatory Visit (INDEPENDENT_AMBULATORY_CARE_PROVIDER_SITE_OTHER): Payer: Self-pay | Admitting: Internal Medicine

## 2015-11-01 VITALS — BP 126/75 | HR 83 | Temp 98.1°F | Ht 60.0 in | Wt 161.7 lb

## 2015-11-01 DIAGNOSIS — R0789 Other chest pain: Secondary | ICD-10-CM

## 2015-11-01 DIAGNOSIS — E785 Hyperlipidemia, unspecified: Secondary | ICD-10-CM

## 2015-11-01 DIAGNOSIS — Z09 Encounter for follow-up examination after completed treatment for conditions other than malignant neoplasm: Secondary | ICD-10-CM

## 2015-11-01 DIAGNOSIS — Z87898 Personal history of other specified conditions: Secondary | ICD-10-CM

## 2015-11-01 NOTE — Assessment & Plan Note (Signed)
Moderate intensity statin for primary prevention in patient with DM. -Start atorvastatin 20 mg

## 2015-11-01 NOTE — Assessment & Plan Note (Signed)
ACS ruled out during recent admission, and low risk NM stress test.  She does have multiple cardiac risk factors, but no evidence of CAD at this time.

## 2015-11-01 NOTE — Patient Instructions (Signed)
You were seen in clinic today as follow-up from your recent hospitalization.  I am glad that you are feeling well.  Please take all your medicines as prescribed, including the new atorvastatin.  Continue exercising and try to lose weight.  Getting your diabetes under control is the best thing you can do right now, and exercise and weight loss in addition to medicines will help.  Go to your appointment with Dr Lawerance BachBurns next month.  If you have more chest pain or shortness of breath, please call 911 and go the ED.

## 2015-11-01 NOTE — Progress Notes (Signed)
   CC: hospital follow-up  HPI:  [name] is a 45 y.o. woman with history of DM2 (A1c 11.0%), HTN, and NAFLD who presents for hospital follow-up of chest pain.  She was admitted 10/19-10/21 for substernal, left-sided chest pressure, palpitations, dyspnea, and nausea.  ACS was ruled out with serial toponins and non-ischemic EKG, and she had a CXR without acute cardiopulmonary disease.   MPI stress test was low risk.    Since discharge, she has had no further chest pain, palpitations, or shortness of breath.  She has filled her atorvastatin prescription, but has not started taking it yet.  She has multiple risk factors for CAD, including uncontolled DM, HTN, and family history.  Walking ~1hr 4x per week.    She reports increased anxiety surrounding keeping her husband happy the night before admission.  Past Medical History:  Diagnosis Date  . Depression   . Diabetes mellitus   . Fatty liver disease, nonalcoholic   . Hypertension   . Sinusitis     Review of Systems:  Review of Systems  Constitutional: Negative for chills and fever.  Respiratory: Negative for shortness of breath.   Cardiovascular: Negative for chest pain and palpitations.     Physical Exam:  Vitals:   11/01/15 1009  BP: 126/75  Pulse: 83  Temp: 98.1 F (36.7 C)  SpO2: 100%  Weight: 161 lb 11.2 oz (73.3 kg)  Height: 5' (1.524 m)   Physical Exam  Constitutional: She is oriented to person, place, and time. She appears well-developed and well-nourished.  Cardiovascular: Normal rate, regular rhythm and normal heart sounds.   Pulmonary/Chest: Effort normal and breath sounds normal.  Neurological: She is alert and oriented to person, place, and time.  Psychiatric: She has a normal mood and affect. Her behavior is normal.    Assessment & Plan:   See Encounters Tab for problem based charting.  -Follow-up with Dr Lawerance BachBurns in 1 month as scheduled.  Patient seen with Dr. Oswaldo DoneVincent

## 2015-11-04 ENCOUNTER — Telehealth: Payer: Self-pay | Admitting: Internal Medicine

## 2015-11-04 NOTE — Telephone Encounter (Signed)
APT. REMINDER CALL, LMTCB °

## 2015-11-05 ENCOUNTER — Ambulatory Visit: Payer: Self-pay

## 2015-11-05 NOTE — Progress Notes (Signed)
Internal Medicine Clinic Attending  I saw and evaluated the patient.  I personally confirmed the key portions of the history and exam documented by Dr. O'Sullivan and I reviewed pertinent patient test results.  The assessment, diagnosis, and plan were formulated together and I agree with the documentation in the resident's note.   

## 2015-11-14 ENCOUNTER — Other Ambulatory Visit: Payer: Self-pay | Admitting: *Deleted

## 2015-11-14 DIAGNOSIS — IMO0001 Reserved for inherently not codable concepts without codable children: Secondary | ICD-10-CM

## 2015-11-14 DIAGNOSIS — E1165 Type 2 diabetes mellitus with hyperglycemia: Principal | ICD-10-CM

## 2015-11-14 MED ORDER — INSULIN GLARGINE 100 UNIT/ML SOLOSTAR PEN: 54.0000 [IU] | PEN_INJECTOR | Freq: Every day | SUBCUTANEOUS | 4 refills | Status: DC

## 2015-11-26 NOTE — Assessment & Plan Note (Addendum)
Patient unfortunately has had very uncontrolled T2DM. At last visit, she did not bring her meter. Her A1c was 11.4. Glipizide 10 mg QD was added to her Lantus 54 units QHS, Novolog 16 units TID WC and Metformin 1000 mg BID.  A1c today is  Plan: -Increase glipizide to 10 mg BID -Continue Lantus 54 units QHS -Continue Novolog 16 units TID WC -Continue Metformin 1000 mg BID

## 2015-11-26 NOTE — Progress Notes (Deleted)
    CC: Follow up for T2DM  HPI: [name] is a 45 y.o. female with PMHx of T2DM, HTN, HLD and NAFLD who presents to the clinic for follow up for T2DM.   Please see problem based assessment and plan for more information. Patient is eating and drinking well. She denies chest pain or shortness of breath.   Past Medical History:  Diagnosis Date  . Depression   . Diabetes mellitus   . Fatty liver disease, nonalcoholic   . Hypertension   . Sinusitis     Review of Systems: Please see pertinent ROS reviewed in HPI and problem based charting.   Physical Exam: There were no vitals filed for this visit. General: Vital signs reviewed.  Patient is well-developed and well-nourished, in no acute distress and cooperative with exam.  Head: Normocephalic and atraumatic. Eyes: EOMI, conjunctivae normal, no scleral icterus.  Neck: Supple, trachea midline, normal ROM, no JVD, masses, thyromegaly, or carotid bruit present.  Cardiovascular: RRR, S1 normal, S2 normal, no murmurs, gallops, or rubs. Pulmonary/Chest: Clear to auscultation bilaterally, no wheezes, rales, or rhonchi. Abdominal: Soft, non-tender, non-distended, BS +, no masses, organomegaly, or guarding present.  Musculoskeletal: No joint deformities, erythema, or stiffness, ROM full and nontender. Extremities: No lower extremity edema bilaterally,  pulses symmetric and intact bilaterally. No cyanosis or clubbing. Neurological: A&O x3, Strength is normal and symmetric bilaterally, cranial nerve II-XII are grossly intact, no focal motor deficit, sensory intact to light touch bilaterally.  Skin: Warm, dry and intact. No rashes or erythema. Psychiatric: Normal mood and affect. speech and behavior is normal. Cognition and memory are normal.   Assessment & Plan:  See encounters tab for problem based medical decision making. Patient discussed with Dr. Josem KaufmannKlima

## 2015-11-26 NOTE — Assessment & Plan Note (Signed)
Recent RUQ US showed heaptic steatosis but no focal liver lesions. Statin was held in the past as it was thought to be contributory; however, LFTs did not change after being discontinued. Therefore, atorvastatin was restarted. She received her Hepatitis B vaccination 2 months ago (1/3). Today, she will received 2/3.   Plan; -Continue to monitor -Needs repeat Hepatitis B vaccination in 4 months

## 2015-11-27 ENCOUNTER — Encounter: Payer: Self-pay | Admitting: Internal Medicine

## 2015-12-11 NOTE — Progress Notes (Signed)
This encounter was created in error - please disregard.

## 2016-02-26 ENCOUNTER — Encounter (INDEPENDENT_AMBULATORY_CARE_PROVIDER_SITE_OTHER): Payer: Self-pay

## 2016-02-26 ENCOUNTER — Ambulatory Visit (INDEPENDENT_AMBULATORY_CARE_PROVIDER_SITE_OTHER): Payer: Self-pay | Admitting: Internal Medicine

## 2016-02-26 ENCOUNTER — Encounter: Payer: Self-pay | Admitting: Internal Medicine

## 2016-02-26 VITALS — BP 128/85 | HR 82 | Temp 98.1°F | Wt 164.3 lb

## 2016-02-26 DIAGNOSIS — M79605 Pain in left leg: Secondary | ICD-10-CM | POA: Insufficient documentation

## 2016-02-26 DIAGNOSIS — Z794 Long term (current) use of insulin: Secondary | ICD-10-CM

## 2016-02-26 DIAGNOSIS — Z79899 Other long term (current) drug therapy: Secondary | ICD-10-CM

## 2016-02-26 DIAGNOSIS — I10 Essential (primary) hypertension: Secondary | ICD-10-CM

## 2016-02-26 DIAGNOSIS — Z23 Encounter for immunization: Secondary | ICD-10-CM

## 2016-02-26 DIAGNOSIS — E1165 Type 2 diabetes mellitus with hyperglycemia: Secondary | ICD-10-CM

## 2016-02-26 DIAGNOSIS — M79652 Pain in left thigh: Secondary | ICD-10-CM

## 2016-02-26 DIAGNOSIS — K760 Fatty (change of) liver, not elsewhere classified: Secondary | ICD-10-CM

## 2016-02-26 DIAGNOSIS — E785 Hyperlipidemia, unspecified: Secondary | ICD-10-CM

## 2016-02-26 LAB — GLUCOSE, CAPILLARY: GLUCOSE-CAPILLARY: 254 mg/dL — AB (ref 65–99)

## 2016-02-26 LAB — POCT GLYCOSYLATED HEMOGLOBIN (HGB A1C): HEMOGLOBIN A1C: 12

## 2016-02-26 MED ORDER — AMLODIPINE BESYLATE 10 MG PO TABS: 10.0000 mg | ORAL_TABLET | Freq: Every day | ORAL | 11 refills | Status: DC

## 2016-02-26 MED ORDER — GLIPIZIDE 10 MG PO TABS: 10.0000 mg | ORAL_TABLET | Freq: Two times a day (BID) | ORAL | 11 refills | Status: DC

## 2016-02-26 MED ORDER — LIRAGLUTIDE 18 MG/3ML ~~LOC~~ SOPN: 1.2000 mg | PEN_INJECTOR | Freq: Every day | SUBCUTANEOUS | 3 refills | Status: DC

## 2016-02-26 MED ORDER — PEN NEEDLES 31G X 5 MM MISC: 1.0000 | Freq: Three times a day (TID) | 11 refills | Status: DC

## 2016-02-26 MED ORDER — ATORVASTATIN CALCIUM 20 MG PO TABS: 20.0000 mg | ORAL_TABLET | Freq: Every day | ORAL | 11 refills | Status: DC

## 2016-02-26 NOTE — Progress Notes (Signed)
    CC: Follow-up for type 2 diabetes  HPI: [name] is a 46 y.o. female with PMHx of type 2 diabetes, hypertension, nonalcoholic fatty liver disease  who presents to the clinic for follow-up for type 2 diabetes.  Patient complains of an acute pain in her left lower extremity located on the distal medial portion of her left thigh. She states she did a lot of walking around the mall yesterday and this morning awoke with the pain in the area. Pain on its worse was a 6-7 out of 10. She took ibuprofen with improvement in the pain. She describes as a cramping sensation. She has never had a pain like this before. She denies any trauma, falls, history of blood clots, history of immobilization, new activity, swelling in her lower extremity. She denies chest pain or shortness of breath. Wells criteria for DVT is 0 to -2.  Please see problem based assessment and plan for more information.  Past Medical History:  Diagnosis Date  . Depression   . Diabetes mellitus   . Fatty liver disease, nonalcoholic   . Hypertension   . Sinusitis      Review of Systems: Please see pertinent ROS reviewed in HPI and problem based charting.   Physical Exam: Vitals:   02/26/16 1603  BP: 128/85  Pulse: 82  Temp: 98.1 F (36.7 C)  TempSrc: Oral  SpO2: 100%  Weight: 164 lb 4.8 oz (74.5 kg)   General: Vital signs reviewed.  Patient is well-developed and well-nourished, in no acute distress and cooperative with exam.  Neck: Supple, trachea midline, no carotid bruit present.  Cardiovascular: RRR, S1 normal, S2 normal, no murmurs, gallops, or rubs. Pulmonary/Chest: Clear to auscultation bilaterally, no wheezes, rales, or rhonchi. Abdominal: Soft, non-tender, non-distended, BS + Musculoskeletal: Mildly tender over distal medial left thigh. No erythema, skin changes, tight musculature, ecchymosis, palpable vein. No lower extremity edema. Normal ambulation normal strength and sensation in left  lower extremity. No tenderness over left knee or posterior to left knee. No palpable Baker's cyst. Extremities: No lower extremity edema bilaterally,  pulses symmetric and intact bilaterally. No cyanosis or clubbing. Skin: Warm, dry and intact. No rashes or erythema. Psychiatric: Normal mood and affect. speech and behavior is normal. Cognition and memory are normal.   Assessment & Plan:  See encounters tab for problem based medical decision making. Patient discussed with Dr. Oswaldo DoneVincent

## 2016-02-26 NOTE — Assessment & Plan Note (Signed)
Patient reports compliance with atorvastatin 20 mg once a day.  Plan: -Continue atorvastatin 20 mg daily

## 2016-02-26 NOTE — Assessment & Plan Note (Addendum)
BP Readings from Last 3 Encounters:  02/26/16 128/85  11/01/15 126/75  10/25/15 111/77    Lab Results  Component Value Date   NA 136 10/25/2015   K 3.8 10/25/2015   CREATININE 0.57 10/25/2015    Assessment: Blood pressure control:  controlled Progress toward BP goal:   at goal Comments: Compliant with amlodipine 10 mg once a day  Plan: Medications:  continue current medications

## 2016-02-26 NOTE — Assessment & Plan Note (Signed)
Patient complains of an acute pain in her left lower extremity located on the distal medial portion of her left thigh. She states she did a lot of walking around the mall yesterday and this morning awoke with the pain in the area. Pain on its worse was a 6-7 out of 10. She took ibuprofen with improvement in the pain. She describes as a cramping sensation. She has never had a pain like this before. She denies any trauma, falls, history of blood clots, history of immobilization, new activity, swelling in her lower extremity. She denies chest pain or shortness of breath. Wells criteria for DVT is 0 to -2.  Assessment: Left leg pain likely secondary to muscular etiology. I doubt DVT, thrombophlebitis, or arthritic pain given location.  Plan: -Conservative treatment with ibuprofen as needed -Patient instructed to call the clinic back if pain worsens, does not improve, or she develops new symptoms associated with her pain

## 2016-02-26 NOTE — Assessment & Plan Note (Signed)
Lab Results  Component Value Date   HGBA1C 12.0 02/26/2016   HGBA1C 11.0 (H) 10/24/2015   HGBA1C 11.4 08/14/2015     Assessment: Diabetes control:  uncontrolled Progress toward A1C goal:   deteriorated Comments: Patient reports compliance with glipizide 10 mg once a day, Lantus 54 units at night, metformin thousand milligrams twice a day. She admits that she frequently forgets to take her NovoLog 16 units before meals.  Plan: Medications:  continue current medications, but increase glipizide to 10 mg twice a day. We have also started her on victoza in addition to her other medications. She will start at 0.6 mg once a day for 7 days then increase to 1.2 mg a day going forward. She will follow-up with me in about 4 weeks.At that time we can reassess and consider increasing her to 1.8 mg daily. She does [facility] her insulin [facility] so that should be affordable for her. I'm hopeful that this medication will be easier for her to take as it is just a one time dose during the day. Other plans: Follow up in 4 weeks. Going forward, if patient continues to be uncontrolled, I would highly consider referring her to endocrinology as I have been unable to improve her glycemic control as her PCP despite frequent adjustments in seeing her every 3 months.

## 2016-02-26 NOTE — Patient Instructions (Signed)
TAKE VICTOZA 0.6 MG ONCE A DAY FOR 7 DAYS.  THEN INCREASE TO 1.2 MG ONCE A DAY EVERY DAY FOLLOWING.  FOLLOW UP IN ONE-TWO MONTHS. I WILL SEND IN REFILLS OF VICTOZA TO [facility].   CONTINUE ALL OTHER MEDICATIONS AS PRESCRIBED.

## 2016-02-26 NOTE — Assessment & Plan Note (Signed)
Patient received her second hepatitis B vaccination. The below note from November 2017 was entered in error as patient no showed her appointment. Patient will need her third vaccination in 2-4 months.

## 2016-02-27 ENCOUNTER — Telehealth: Payer: Self-pay | Admitting: *Deleted

## 2016-02-27 NOTE — Progress Notes (Signed)
Internal Medicine Clinic Attending  Case discussed with Dr. Burns at the time of the visit.  We reviewed the resident's history and exam and pertinent patient test results.  I agree with the assessment, diagnosis, and plan of care documented in the resident's note.  

## 2016-02-27 NOTE — Telephone Encounter (Signed)
-----   Message from Servando SnareAlexa R Burns, MD sent at 02/26/2016  5:31 PM EST ----- Regarding: rx Glenda,  Can you please help me call in the pen needles and the victoza to the map program?  Thank you so much! Alexa

## 2016-02-27 NOTE — Telephone Encounter (Signed)
Victoza and pen needles rx called to [facility] - left message if unable to verbal for pen needles to let me know.

## 2016-03-18 ENCOUNTER — Encounter: Payer: Self-pay | Admitting: Internal Medicine

## 2016-03-18 ENCOUNTER — Ambulatory Visit (INDEPENDENT_AMBULATORY_CARE_PROVIDER_SITE_OTHER): Payer: Self-pay | Admitting: Internal Medicine

## 2016-03-18 VITALS — BP 131/87 | HR 98 | Temp 98.0°F | Ht 62.0 in | Wt 167.1 lb

## 2016-03-18 DIAGNOSIS — Z794 Long term (current) use of insulin: Secondary | ICD-10-CM

## 2016-03-18 DIAGNOSIS — E1165 Type 2 diabetes mellitus with hyperglycemia: Secondary | ICD-10-CM

## 2016-03-18 LAB — GLUCOSE, CAPILLARY: Glucose-Capillary: 107 mg/dL — ABNORMAL HIGH (ref 65–99)

## 2016-03-18 MED ORDER — LIRAGLUTIDE 18 MG/3ML ~~LOC~~ SOPN: 1.8000 mg | PEN_INJECTOR | Freq: Every day | SUBCUTANEOUS | 3 refills | Status: DC

## 2016-03-18 NOTE — Assessment & Plan Note (Addendum)
Lab Results  Component Value Date   HGBA1C 12.0 02/26/2016   HGBA1C 11.0 (H) 10/24/2015   HGBA1C 11.4 08/14/2015     Assessment: Diabetes control:  Uncontrolled Progress toward A1C goal:    improving  Comments:  Patient has a long history of uncontrolled type 2 diabetes. She is currently on glipizide 10 mg twice a day, NovoLog 16 units 3 times a day with meals, Lantus 54 units at night, metformin 1000 mg twice a day and newly added Victoza 1.2 mg a day. She denies nausea, vomiting, hypoglycemia, lightheadedness. She denies any problems taking victoza. She does report improvement in her blood sugars. On review of her glucometer readings, her blood sugars have improved from the 300s to mid 100s to mid 200s.   Plan: Medications:  continue current medications, Increase Victoza to 1.8 mg QD Instruction/counseling given: reminded to bring blood glucose meter & log to each visit, reminded to bring medications to each visit, discussed the need for weight loss and discussed diet Other plans: Patient does appear to be improving on victoza but I am unsure if this will get her to goal. Follow up in 2 months, referral to Endocrinology placed.

## 2016-03-18 NOTE — Progress Notes (Signed)
    CC: Follow-up for diabetes  HPI: [name] is a 46 y.o. female with PMHx of type 2 diabetes, hypertension, hyperlipidemia who presents to the clinic for follow-up for type 2 diabetes.   Patient has a long history of uncontrolled type 2 diabetes. She is currently on glipizide 10 mg twice a day, NovoLog 16 units 3 times a day with meals, Lantus 54 units at night, metformin 1000 mg twice a day and newly added Victoza 1.2 mg a day. She denies nausea, vomiting, hypoglycemia, lightheadedness. She denies any problems taking victoza. She does report improvement in her blood sugars. On review of her glucometer readings, her blood sugars have improved from the 300s to mid 100s to mid 200s.  Past Medical History:  Diagnosis Date  . Depression   . Diabetes mellitus   . Fatty liver disease, nonalcoholic   . Hypertension   . Sinusitis     Review of Systems: Please see pertinent ROS reviewed in HPI and problem based charting.   Physical Exam: Vitals:   03/18/16 1600  BP: 131/87  Pulse: 98  Temp: 98 F (36.7 C)  TempSrc: Oral  SpO2: 100%  Weight: 167 lb 1.6 oz (75.8 kg)   General: Vital signs reviewed.  Patient is well-developed and well-nourished, in no acute distress and cooperative with exam.  Cardiovascular: RRR, S1 normal, S2 normal, no murmurs, gallops, or rubs. Pulmonary/Chest: Clear to auscultation bilaterally, no wheezes, rales, or rhonchi. Abdominal: Soft, non-tender, non-distended, BS + Extremities: No lower extremity edema bilaterally Skin: Warm, dry and intact. No rashes or erythema. Psychiatric: Normal mood and affect. speech and behavior is normal. Cognition and memory are normal.   Assessment & Plan:  See encounters tab for problem based medical decision making. Patient discussed with Dr. Cyndie ChimeGranfortuna

## 2016-03-18 NOTE — Progress Notes (Signed)
Medicine attending: Medical history, presenting problems, physical findings, and medications, reviewed with resident physician Dr Alexa Burns on the day of the patient visit and I concur with her evaluation and management plan. 

## 2016-03-18 NOTE — Patient Instructions (Signed)
Continua tu medicinas. Aumenta tu Victoza a 1.8 a cada dia.   [employer] a tres mesas.  si su nivel de azcar en la sangre cae por debajo de 80, llame a la clnica

## 2016-04-24 ENCOUNTER — Telehealth: Payer: Self-pay | Admitting: Internal Medicine

## 2016-04-24 NOTE — Telephone Encounter (Signed)
CALLED PATIENT, LMTCB, SHE NEEDS TO COME IN NEXT WEEK TO RENEW GCCN AND CAFA

## 2016-04-30 ENCOUNTER — Ambulatory Visit: Payer: Self-pay

## 2016-04-30 ENCOUNTER — Encounter (INDEPENDENT_AMBULATORY_CARE_PROVIDER_SITE_OTHER): Payer: Self-pay

## 2016-05-14 ENCOUNTER — Ambulatory Visit: Payer: Self-pay | Admitting: Internal Medicine

## 2016-06-10 ENCOUNTER — Encounter: Payer: Self-pay | Admitting: *Deleted

## 2016-06-15 NOTE — Addendum Note (Signed)
Addended by: Neomia DearPOWERS, Missael Ferrari E on: 06/15/2016 06:31 PM   Modules accepted: Orders

## 2016-06-24 ENCOUNTER — Ambulatory Visit (INDEPENDENT_AMBULATORY_CARE_PROVIDER_SITE_OTHER): Payer: Self-pay | Admitting: Internal Medicine

## 2016-06-24 ENCOUNTER — Encounter: Payer: Self-pay | Admitting: Internal Medicine

## 2016-06-24 VITALS — BP 125/84 | HR 92 | Temp 98.2°F | Ht 62.0 in | Wt 163.8 lb

## 2016-06-24 DIAGNOSIS — M62838 Other muscle spasm: Secondary | ICD-10-CM

## 2016-06-24 DIAGNOSIS — E1165 Type 2 diabetes mellitus with hyperglycemia: Principal | ICD-10-CM

## 2016-06-24 DIAGNOSIS — Z794 Long term (current) use of insulin: Secondary | ICD-10-CM

## 2016-06-24 DIAGNOSIS — K760 Fatty (change of) liver, not elsewhere classified: Secondary | ICD-10-CM

## 2016-06-24 DIAGNOSIS — IMO0001 Reserved for inherently not codable concepts without codable children: Secondary | ICD-10-CM

## 2016-06-24 DIAGNOSIS — E118 Type 2 diabetes mellitus with unspecified complications: Secondary | ICD-10-CM

## 2016-06-24 DIAGNOSIS — Z23 Encounter for immunization: Secondary | ICD-10-CM

## 2016-06-24 DIAGNOSIS — Z9114 Patient's other noncompliance with medication regimen: Secondary | ICD-10-CM

## 2016-06-24 LAB — POCT GLYCOSYLATED HEMOGLOBIN (HGB A1C): Hemoglobin A1C: 11.1

## 2016-06-24 LAB — GLUCOSE, CAPILLARY: Glucose-Capillary: 320 mg/dL — ABNORMAL HIGH (ref 65–99)

## 2016-06-24 MED ORDER — CYCLOBENZAPRINE HCL 5 MG PO TABS: 5.0000 mg | ORAL_TABLET | Freq: Every evening | ORAL | 0 refills | Status: DC | PRN

## 2016-06-24 MED ORDER — GLIPIZIDE 10 MG PO TABS: 10.0000 mg | ORAL_TABLET | Freq: Two times a day (BID) | ORAL | 11 refills | Status: DC

## 2016-06-24 MED ORDER — LIRAGLUTIDE 18 MG/3ML ~~LOC~~ SOPN: 1.8000 mg | PEN_INJECTOR | Freq: Every day | SUBCUTANEOUS | 3 refills | Status: DC

## 2016-06-24 MED ORDER — INSULIN GLARGINE 100 UNIT/ML SOLOSTAR PEN: 54.0000 [IU] | PEN_INJECTOR | Freq: Every day | SUBCUTANEOUS | 4 refills | Status: DC

## 2016-06-24 MED ORDER — METFORMIN HCL 1000 MG PO TABS: 1000.0000 mg | ORAL_TABLET | Freq: Two times a day (BID) | ORAL | 3 refills | Status: DC

## 2016-06-24 NOTE — Assessment & Plan Note (Signed)
Patient received her third and final hepatitis B vaccination today. Continue metformin and atorvastatin. Emphasized the importance of weight loss.

## 2016-06-24 NOTE — Assessment & Plan Note (Signed)
Lab Results  Component Value Date   HGBA1C 11.1 06/24/2016   HGBA1C 12.0 02/26/2016   HGBA1C 11.0 (H) 10/24/2015     Assessment: Diabetes control:  uncontrolled Progress toward A1C goal:   minimally improved Comments: Patient's main barrier to controlling her type 2 diabetes is compliance with her medications. Patient is very busy taking care of her children and often forgets to take some of her medications. She also reports being out of metformin and victoza for the past 1 month due to inadequate refills from the pharmacy. Patient is on metformin thousand milligrams twice a day, Lantus 54 units at night, glipizide 10 mg twice a day(but taking once a day), and victoza 1.8 mg once a day.  Plan: Medications:  continue current medications, printed out refills on her medications to ensure adequate refills and educated patient on taking glipizide twice a day. Instruction/counseling given: reminded to bring medications to each visit Educational resources provided:  patient should check her blood sugar 3-4 times per day Other plans: Follow up in 3 months. Patient was recently referred to endocrinology for assistance with diabetes control. However, patient no showed her appointment on 05/14/2016. She states that she was sick that day. Unfortunately, patient no longer has [employer] card and would be self-pay which she states that she cannot afford at this time. If we were able to have the patient be compliant with her medications I do think we could get adequate control.

## 2016-06-24 NOTE — Assessment & Plan Note (Signed)
Patient reports a 3 day history of left-sided neck pain. She states that she awoke with the pain on Sunday morning. The pain is constant and worsens with quick movements of her neck. She has tried Tylenol without relief. She denies any new activities or injury. She states that the pain on the left side of her neck radiates down to the left shoulder. Physical exam, patient has normal range of motion. She has increased pain with side bending and rotating to the right. She is tender along the left side of her neck throughout the trapezius muscle.  Assessment: Trapezius muscle spasm  Plan: -Continue warm compresses -Continue Tylenol -Flexeril 5 mg daily at bedtime when necessary for 5 days

## 2016-06-24 NOTE — Progress Notes (Signed)
    CC: Follow-up for type 2 diabetes  HPI: [name] is a 46 y.o. female with PMHx of type 2 diabetes, hypertension, non-alcoholic fatty liver disease who presents to the clinic for follow-up for type 2 diabetes.   Patient reports a 3 day history of left-sided neck pain. She states that she awoke with the pain on Sunday morning. The pain is constant and worsens with quick movements of her neck. She has tried Tylenol without relief. She denies any new activities or injury. She states that the pain on the left side of her neck radiates down to the left shoulder.  Please see problem based assessment and plan for more information of patient's chronic medical conditions.   Past Medical History:  Diagnosis Date  . Depression   . Diabetes mellitus   . Fatty liver disease, nonalcoholic   . Hypertension   . Sinusitis     Review of Systems: Please see pertinent ROS reviewed in HPI and problem based charting.   Physical Exam: Blood pressure 125/84, pulse 92, temperature 98.2 F (36.8 C), temperature source Oral, height 5\' 2"  (1.575 m), weight 163 lb 12.8 oz (74.3 kg), SpO2 99 %. General: Vital signs reviewed.  Patient is in no acute distress and cooperative with exam.  Cardiovascular: RRR,  no murmurs, gallops, or rubs. No JVD or carotid bruit present. No lower extremity edema bilaterally. Bilateral radial and pedal pulses are intact and symmetric bilaterally.  Pulmonary: Clear to auscultation bilaterally, no wheezes, rales, or rhonchi. No accessory muscle use. Gastrointestinal: Soft, non-tender, non-distended, BS +, Obese Musculoskeletal: Tender on palpation of left neck throughout trapezius muscle. Normal range of motion. Increased pain with right-sided sidebending and right rotation of neck. Negative Spurling's maneuver   Skin: Warm, dry and intact. No rashes or erythema. Psychiatric: Normal mood and affect. speech and behavior is normal.   Assessment & Plan:  See  encounters tab for problem based medical decision making. Patient discussed with Dr. Criselda PeachesMullen

## 2016-06-24 NOTE — Patient Instructions (Signed)
[name],  Please continue taking all medications as prescribed. He received her final hepatitis B vaccine today. Your neck pain is likely due to a strained muscle.   Toma cyclobenzaprine 5 mg a noche para cinco noches.

## 2016-06-28 NOTE — Progress Notes (Signed)
Internal Medicine Clinic Attending  Case discussed with Dr. Burns at the time of the visit.  We reviewed the resident's history and exam and pertinent patient test results.  I agree with the assessment, diagnosis, and plan of care documented in the resident's note.  

## 2016-10-05 ENCOUNTER — Other Ambulatory Visit: Payer: Self-pay | Admitting: *Deleted

## 2016-10-05 DIAGNOSIS — IMO0001 Reserved for inherently not codable concepts without codable children: Secondary | ICD-10-CM

## 2016-10-05 DIAGNOSIS — E1165 Type 2 diabetes mellitus with hyperglycemia: Principal | ICD-10-CM

## 2016-10-08 MED ORDER — INSULIN ASPART 100 UNIT/ML ~~LOC~~ SOLN: 16.0000 [IU] | Freq: Three times a day (TID) | SUBCUTANEOUS | 11 refills | Status: DC

## 2016-10-13 ENCOUNTER — Ambulatory Visit: Payer: Self-pay

## 2016-10-14 ENCOUNTER — Ambulatory Visit: Payer: Self-pay

## 2016-10-23 ENCOUNTER — Ambulatory Visit (INDEPENDENT_AMBULATORY_CARE_PROVIDER_SITE_OTHER): Payer: Self-pay | Admitting: Internal Medicine

## 2016-10-23 ENCOUNTER — Encounter: Payer: Self-pay | Admitting: Internal Medicine

## 2016-10-23 VITALS — BP 135/84 | HR 99 | Temp 98.3°F | Wt 162.2 lb

## 2016-10-23 DIAGNOSIS — E1165 Type 2 diabetes mellitus with hyperglycemia: Secondary | ICD-10-CM

## 2016-10-23 DIAGNOSIS — E119 Type 2 diabetes mellitus without complications: Secondary | ICD-10-CM

## 2016-10-23 DIAGNOSIS — IMO0001 Reserved for inherently not codable concepts without codable children: Secondary | ICD-10-CM

## 2016-10-23 DIAGNOSIS — I10 Essential (primary) hypertension: Secondary | ICD-10-CM

## 2016-10-23 DIAGNOSIS — Z23 Encounter for immunization: Secondary | ICD-10-CM

## 2016-10-23 DIAGNOSIS — Z79899 Other long term (current) drug therapy: Secondary | ICD-10-CM

## 2016-10-23 DIAGNOSIS — Z794 Long term (current) use of insulin: Secondary | ICD-10-CM

## 2016-10-23 LAB — GLUCOSE, CAPILLARY: GLUCOSE-CAPILLARY: 345 mg/dL — AB (ref 65–99)

## 2016-10-23 LAB — POCT GLYCOSYLATED HEMOGLOBIN (HGB A1C): Hemoglobin A1C: 9.5

## 2016-10-23 MED ORDER — INSULIN ASPART 100 UNIT/ML ~~LOC~~ SOLN: 16.0000 [IU] | Freq: Three times a day (TID) | SUBCUTANEOUS | 11 refills | Status: DC

## 2016-10-23 NOTE — Patient Instructions (Addendum)
Fue un Financial plannerplacer conocerla hoy. [name].   Su diabetes esta mucho mejor! Su A1c es 9.5 hoy. Siga el buen trabajo.   Recibio Robin Klein muestra de Novolog para el fin de semana. Injectese el medicamento 3 veces al dia como have usualmente. Por favor, recoja su receta de Novolog el lunes.   Hice un referrido para el examen de la vista para la diabetes. Ellos la Robin Klein a llamar para sacar una cita.   Recibio la vacuna de la influenza hoy.   La llamaremos si los resultados de sus laboratorios no son normales.   Vuelva en 3 meses. Recuerde traer su glucometro para la siguiente cita. Llame a la clinica de medicina interna si tiene alguna pregunta.

## 2016-10-24 LAB — MICROALBUMIN / CREATININE URINE RATIO
CREATININE, UR: 31.3 mg/dL
Microalb/Creat Ratio: 11.8 mg/g creat (ref 0.0–30.0)
Microalbumin, Urine: 3.7 ug/mL

## 2016-10-24 LAB — BMP8+ANION GAP
Anion Gap: 15 mmol/L (ref 10.0–18.0)
BUN / CREAT RATIO: 14 (ref 9–23)
BUN: 8 mg/dL (ref 6–24)
CO2: 20 mmol/L (ref 20–29)
CREATININE: 0.58 mg/dL (ref 0.57–1.00)
Calcium: 8.8 mg/dL (ref 8.7–10.2)
Chloride: 102 mmol/L (ref 96–106)
GFR calc Af Amer: 130 mL/min/{1.73_m2} (ref >59)
GFR calc non Af Amer: 112 mL/min/{1.73_m2} (ref >59)
Glucose: 346 mg/dL — ABNORMAL HIGH (ref 65–99)
Potassium: 4 mmol/L (ref 3.5–5.2)
Sodium: 137 mmol/L (ref 134–144)

## 2016-10-24 NOTE — Assessment & Plan Note (Addendum)
Patient is currently on glipizide 10 mg BID + metformin 1000 mg BID + liraglutide 1.8 mg QD + Lantus 54U QAM + Novolog 16U TID with meals. Patient reports compliance but states she ran out of Novolog 2 weeks ago. States she has been trying to limit carbohydrate intake but does eat tortillas and rice at home some times. No sodas or juice. Denies polyruria, polydipsia, and symptoms of peripheral neuropathy. A1c today 9.5 from 11.1 on 06/2016. Did not bring glucometer today.   - BMP and urine microalbumin ordered  - Ad[facility] if she ever runs out of DM medications. Discussed she does not need an appointment for this.  - Referral for diabetic eye exam made - [facility] Provided patient with Novolog sample as [facility] closed on weekends  - Follow up in 3 months. Advised patient to bring glucometer to next visit.

## 2016-10-24 NOTE — Progress Notes (Signed)
   CC: T2DM and HTN follow up   HPI:  [name] is a 46 y.o. female with past medical history as described below who presents to clinic for T2DM and HTN follow up. Please see problem based assessment and plan for for further details.   Past Medical History:  Diagnosis Date  . Depression   . Diabetes mellitus   . Fatty liver disease, nonalcoholic   . Hypertension   . Sinusitis    Review of Systems:   Review of Systems  Constitutional: Negative for chills, fever, malaise/fatigue and weight loss.  Eyes: Negative for blurred vision and double vision.  Respiratory: Negative for shortness of breath.   Cardiovascular: Negative for chest pain, palpitations and leg swelling.  Gastrointestinal: Negative for abdominal pain, constipation, diarrhea, nausea and vomiting.  Neurological: Negative for dizziness, tingling, sensory change and weakness.  All other systems reviewed and are negative.   Physical Exam:  Vitals:   10/23/16 1557  BP: 135/84  Pulse: 99  Temp: 98.3 F (36.8 C)  TempSrc: Oral  SpO2: 96%  Weight: 162 lb 3.2 oz (73.6 kg)   General: very pleasant female, well-developed, well-nourished, in no acute distress  Cardiac: regular rate and rhythm, nl S1/S2, no murmurs, rubs or gallops  Pulm: CTAB, no wheezes or crackles, no increased work of breathing  Abd: soft, NTND, bowel sounds present   Neuro: A&Ox3, able to move all 4 extremities, no focal deficits noted  Ext: warm and well perfused, no peripheral edema   Assessment & Plan:   See Encounters Tab for problem based charting.  Patient seen with Dr. Criselda PeachesMullen

## 2016-10-24 NOTE — Assessment & Plan Note (Signed)
BP at goal 135/84. On amlodipine 10 mg QD and compliant. Denies HA, changes in vision, chest pain, shortness of breath, and lower extremity swelling.   HTN: chronic and well-controlled  - Will continue continue current regimen

## 2016-10-26 NOTE — Progress Notes (Signed)
Internal Medicine Clinic Attending  I saw and evaluated the patient.  I personally confirmed the key portions of the history and exam documented by Dr. Santos-Sanchez and I reviewed pertinent patient test results.  The assessment, diagnosis, and plan were formulated together and I agree with the documentation in the resident's note. 

## 2016-11-09 ENCOUNTER — Telehealth: Payer: Self-pay | Admitting: *Deleted

## 2016-11-09 NOTE — Telephone Encounter (Signed)
[facility] pharmacy calling to verify order for Novolog (per patient request). It was sent as " no print" 10/23/16.  Verified order details with pharmacist.

## 2016-12-25 ENCOUNTER — Ambulatory Visit: Payer: Self-pay

## 2016-12-31 ENCOUNTER — Other Ambulatory Visit: Payer: Self-pay | Admitting: *Deleted

## 2016-12-31 DIAGNOSIS — E1165 Type 2 diabetes mellitus with hyperglycemia: Principal | ICD-10-CM

## 2016-12-31 DIAGNOSIS — IMO0001 Reserved for inherently not codable concepts without codable children: Secondary | ICD-10-CM

## 2016-12-31 MED ORDER — INSULIN GLARGINE 100 UNIT/ML SOLOSTAR PEN: 54.0000 [IU] | PEN_INJECTOR | Freq: Every day | SUBCUTANEOUS | 4 refills | Status: DC

## 2017-01-01 ENCOUNTER — Other Ambulatory Visit: Payer: Self-pay | Admitting: *Deleted

## 2017-01-01 DIAGNOSIS — IMO0001 Reserved for inherently not codable concepts without codable children: Secondary | ICD-10-CM

## 2017-01-01 DIAGNOSIS — E1165 Type 2 diabetes mellitus with hyperglycemia: Principal | ICD-10-CM

## 2017-01-01 MED ORDER — LIRAGLUTIDE 18 MG/3ML ~~LOC~~ SOPN: 1.8000 mg | PEN_INJECTOR | Freq: Every day | SUBCUTANEOUS | 3 refills | Status: DC

## 2017-03-16 NOTE — Addendum Note (Signed)
Addended by: Neomia DearPOWERS, Praise Dolecki E on: 03/16/2017 06:39 PM   Modules accepted: Orders

## 2017-03-25 ENCOUNTER — Other Ambulatory Visit: Payer: Self-pay | Admitting: *Deleted

## 2017-03-25 DIAGNOSIS — K760 Fatty (change of) liver, not elsewhere classified: Secondary | ICD-10-CM

## 2017-03-25 DIAGNOSIS — I10 Essential (primary) hypertension: Secondary | ICD-10-CM

## 2017-03-25 MED ORDER — ATORVASTATIN CALCIUM 20 MG PO TABS: 20.0000 mg | ORAL_TABLET | Freq: Every day | ORAL | 11 refills | Status: DC

## 2017-03-25 MED ORDER — AMLODIPINE BESYLATE 10 MG PO TABS: 10.0000 mg | ORAL_TABLET | Freq: Every day | ORAL | 2 refills | Status: DC

## 2017-03-26 ENCOUNTER — Ambulatory Visit (INDEPENDENT_AMBULATORY_CARE_PROVIDER_SITE_OTHER): Payer: Self-pay | Admitting: Internal Medicine

## 2017-03-26 ENCOUNTER — Other Ambulatory Visit: Payer: Self-pay

## 2017-03-26 ENCOUNTER — Encounter: Payer: Self-pay | Admitting: Internal Medicine

## 2017-03-26 VITALS — BP 136/81 | HR 84 | Temp 97.9°F | Ht 62.0 in | Wt 157.9 lb

## 2017-03-26 DIAGNOSIS — Z794 Long term (current) use of insulin: Secondary | ICD-10-CM

## 2017-03-26 DIAGNOSIS — E118 Type 2 diabetes mellitus with unspecified complications: Secondary | ICD-10-CM

## 2017-03-26 DIAGNOSIS — E1165 Type 2 diabetes mellitus with hyperglycemia: Secondary | ICD-10-CM

## 2017-03-26 LAB — POCT GLYCOSYLATED HEMOGLOBIN (HGB A1C): HEMOGLOBIN A1C: 8.1

## 2017-03-26 LAB — GLUCOSE, CAPILLARY: Glucose-Capillary: 295 mg/dL — ABNORMAL HIGH (ref 65–99)

## 2017-03-26 NOTE — Assessment & Plan Note (Signed)
Patient presented for continued evaluation and management of her uncontrolled diabetes mellitus. Her medical management includes glipizide 10 mg BID, metformin 1000 mg BID, Lantus 54 units QHS, NovoLog 16 units TID with meals, and Victoza 1.8 mg daily. She does not check her blood sugars consistently and states that her life is too hectic. She will try to check them more consistently. She does exercise daily with vigorous walking. She is attempting to modify her diet and cut out tortillas. She denies the use of sodas or high sugar beverages. She denies signs and symptoms of hypoglycemia. She states that she has never been admitted for hyperglycemia. She does acknowledge that she frequently misses her daytime doses of NovoLog due to her hectic schedule and running around. She often forgets her pain at home or hesitates to take it because she forgets to take it before meals.  The patients A1c is down to 8.1 today from a prior of 9.6. I encouraged her to continue to work on her lifestyle modifications. We did discuss the option of of ego insulin pump given that she frequently forgets her NovoLog when she is out and about. She states that this is something that she would be interested in would like to meet with Lupita Leashonna. I have placed a referral for her to meet with Lupita Leashonna. She also voices that she recently missed her diabetic eye exam and needs another referral. This has been placed. We will not make any other medical changes at this point. She will follow-up in three months.

## 2017-03-26 NOTE — Progress Notes (Signed)
Medicine attending: Medical history, presenting problems, physical findings, and medications, reviewed with resident physician Dr Justin Helberg on the day of the patient visit and I concur with his evaluation and management plan. 

## 2017-03-26 NOTE — Progress Notes (Signed)
   CC: diabetes mellitus  HPI:  [name] is a 47 y.o. female who presented to the clinic for continued evaluation and management of her uncontrolled diabetes mellitus. For detailed assessment and plan please refer to problem-based charting below.  Past Medical History:  Diagnosis Date  . Depression   . Diabetes mellitus   . Fatty liver disease, nonalcoholic   . Hypertension   . Sinusitis    Review of Systems:  Denies chest pain, shortness of breath Denies abdominal pain, nausea/vomiting  Physical Exam: Vitals:   03/26/17 1038  BP: 136/81  Pulse: 84  Temp: 97.9 F (36.6 C)  TempSrc: Oral  SpO2: 100%  Weight: 157 lb 14.4 oz (71.6 kg)  Height: 5\' 2"  (1.575 m)   General: Obese female in no acute distress Pulm: Good air movement with no wheezing or crackles  CV: RRR, no murmurs, no rubs  Skin: Warm and dry   Assessment & Plan:   See Encounters Tab for problem based charting.  Patient discussed with Dr. Cyndie ChimeGranfortuna

## 2017-03-26 NOTE — Patient Instructions (Signed)
Thank you for allowing us to provide your care.   Please schedule an appointment with Robin Klein on the way out to discuss getting a Vgo insulin pump.   Keep up the good work with exercising and modifying your diet.   I have sent out another referral for a diabetic eye exam.

## 2017-03-29 ENCOUNTER — Other Ambulatory Visit: Payer: Self-pay | Admitting: *Deleted

## 2017-03-29 DIAGNOSIS — E1165 Type 2 diabetes mellitus with hyperglycemia: Principal | ICD-10-CM

## 2017-03-29 DIAGNOSIS — IMO0001 Reserved for inherently not codable concepts without codable children: Secondary | ICD-10-CM

## 2017-03-29 MED ORDER — GLIPIZIDE 10 MG PO TABS: 10.0000 mg | ORAL_TABLET | Freq: Two times a day (BID) | ORAL | 3 refills | Status: DC

## 2017-03-29 NOTE — Telephone Encounter (Signed)
I would prefer her to stay on the glipizide as she actually takes this medication vs the Novolog which she usually forgets to inject throughout the day. Thank you.

## 2017-04-26 ENCOUNTER — Encounter: Payer: Self-pay | Admitting: Dietician

## 2017-05-26 NOTE — Addendum Note (Signed)
Addended by: Neomia Dear on: 05/26/2017 09:49 AM   Modules accepted: Orders

## 2017-06-11 ENCOUNTER — Encounter: Payer: Self-pay | Admitting: *Deleted

## 2017-07-02 ENCOUNTER — Ambulatory Visit: Payer: Self-pay

## 2017-07-02 ENCOUNTER — Other Ambulatory Visit: Payer: Self-pay

## 2017-07-02 ENCOUNTER — Encounter: Payer: Self-pay | Admitting: Internal Medicine

## 2017-07-02 ENCOUNTER — Ambulatory Visit (INDEPENDENT_AMBULATORY_CARE_PROVIDER_SITE_OTHER): Payer: Self-pay | Admitting: Internal Medicine

## 2017-07-02 VITALS — BP 117/82 | HR 96 | Temp 98.9°F | Ht 62.0 in | Wt 157.5 lb

## 2017-07-02 DIAGNOSIS — Z794 Long term (current) use of insulin: Secondary | ICD-10-CM

## 2017-07-02 DIAGNOSIS — IMO0001 Reserved for inherently not codable concepts without codable children: Secondary | ICD-10-CM

## 2017-07-02 DIAGNOSIS — E1165 Type 2 diabetes mellitus with hyperglycemia: Secondary | ICD-10-CM

## 2017-07-02 DIAGNOSIS — Z79899 Other long term (current) drug therapy: Secondary | ICD-10-CM

## 2017-07-02 DIAGNOSIS — E118 Type 2 diabetes mellitus with unspecified complications: Secondary | ICD-10-CM

## 2017-07-02 DIAGNOSIS — I10 Essential (primary) hypertension: Secondary | ICD-10-CM

## 2017-07-02 DIAGNOSIS — Z9114 Patient's other noncompliance with medication regimen: Secondary | ICD-10-CM

## 2017-07-02 LAB — POCT GLYCOSYLATED HEMOGLOBIN (HGB A1C): HEMOGLOBIN A1C: 10.2 % — AB (ref 4.0–5.6)

## 2017-07-02 LAB — GLUCOSE, CAPILLARY: GLUCOSE-CAPILLARY: 297 mg/dL — AB (ref 70–99)

## 2017-07-02 MED ORDER — REPAGLINIDE 1 MG PO TABS: 1.0000 mg | ORAL_TABLET | Freq: Three times a day (TID) | ORAL | 0 refills | Status: DC

## 2017-07-02 MED ORDER — METFORMIN HCL 1000 MG PO TABS: 1000.0000 mg | ORAL_TABLET | Freq: Two times a day (BID) | ORAL | 3 refills | Status: DC

## 2017-07-02 NOTE — Progress Notes (Signed)
   CC: T2DM and HTN follow up   HPI:  Robin Klein is a 47 y.o. female with PMH listed below who presents to clinic for type 2 diabetes and hypertension follow-up.  T2DM, uncontrolled: Patient is currently on maximum doses of metformin, glipizide, and Victoza as well as Lantus 54U at night and NovoLog 16U with meals.  She reports compliance with most of her diabetes medications with the exception of NovoLog.  States most of the time she feels to tired to inject it with meals.  She brought her BG meter today, however there is only one measurement of 468 on 6/1. Her A1c continues to trend up, 8.1-->10.2. Unfortunately, she is not a candidate for CGM monitoring as she is uninsured and does not show motivation to stay compliance with medications. I discussed with patient importance of compliance with medications and we had an extensive discussion about macro and microvascular complications of type 2 diabetes as well.  We will continue oral medications and Lantus as above.  Will stop NovoLog as patient only takes it on rare occasions and will add Prandin 1 mg before meals. Advised patient to call clinic if she is unable to afford it. Urine microalbumin also ordered. Asked her to follow up in 3 months. Foot exam performed today. Unable to perform eye exam as patient lost orange card. She does have an appointment with Chauncey Readingeb Hill next week.    HTN, controlled: BP at goal. Will continue amlodipine 10 mg QD. Renal function normal on blood work from 10/2016.   Past Medical History:  Diagnosis Date  . Depression   . Diabetes mellitus   . Fatty liver disease, nonalcoholic   . Hypertension   . Sinusitis    Review of Systems:   Review of Systems  Constitutional: Negative for chills, fever and weight loss.  Respiratory: Negative for cough and shortness of breath.   Cardiovascular: Negative for chest pain and palpitations.  Gastrointestinal: Negative for abdominal pain, constipation, diarrhea,  nausea and vomiting.  Genitourinary: Negative for frequency and urgency.  Neurological: Negative for dizziness.    Physical Exam:  Vitals:   07/02/17 1402  BP: 117/82  Pulse: 96  Temp: 98.9 F (37.2 C)  TempSrc: Oral  SpO2: 100%  Weight: 157 lb 8 oz (71.4 kg)  Height: 5\' 2"  (1.575 m)   Physical Exam  Constitutional: She is oriented to person, place, and time and well-developed, well-nourished, and in no distress.  Cardiovascular: Normal rate, regular rhythm and normal heart sounds. Exam reveals no gallop and no friction rub.  No murmur heard. Pulmonary/Chest: Effort normal and breath sounds normal. No respiratory distress. She has no wheezes. She has no rales.  Abdominal: Soft. Bowel sounds are normal. She exhibits no distension. There is no tenderness.  Musculoskeletal: She exhibits no edema.  Neurological: She is alert and oriented to person, place, and time.    Assessment & Plan:   See Encounters Tab for problem based charting.  Patient discussed with Dr. Rogelia BogaButcher

## 2017-07-02 NOTE — Assessment & Plan Note (Signed)
T2DM, uncontrolled: Patient is currently on maximum doses of metformin, glipizide, and Victoza as well as Lantus 54U at night and NovoLog 16U with meals.  She reports compliance with most of her diabetes medications with the exception of NovoLog.  States most of the time she feels to tired to inject it with meals.  She brought her BG meter today, however there is only one measurement of 468 on 6/1. Her A1c continues to trend up, 8.1-->10.2. Unfortunately, she is not a candidate for CGM monitoring as she is uninsured and does not show motivation to stay compliance with medications. I discussed with patient importance of compliance with medications and we had an extensive discussion about macro and microvascular complications of type 2 diabetes as well.  - We will continue oral medications and Lantus as above.   - Will stop NovoLog as patient only takes it on rare occasions and will add Prandin 1 mg before meals. Advised patient to call clinic if she is unable to afford it.  - Follow up urine microalbumin also ordered. - Follow up in 3 months.  - Foot exam performed today  - Unable to perform eye exam as patient lost orange card. She does have an appointment with Chauncey Readingeb [facility] next week.

## 2017-07-02 NOTE — Patient Instructions (Signed)
Robin Klein,   Continue tomandose su metformina, glipizide, Lantus y Victoza como siempre.   Pare de SunTrust[employer]ectarse Novolog con las comidas. Ahora va a comenzar a tomar Prandin 1 mg. Va a tomar una tableta justo anted de cada comida.   Hoy le hicimos el examen de los ojos y de [place] tambien. Le dejaremos saber como resultan los examenes.   Recuerde checarse el azucar de 3 a 4 veces al dia. Esto va a ayudar a ajustar sus medicamentos en su proxima visita.   Haga un cita de seguimiento conmigo en 3 meses.   - Dra. Evelene CroonSantos

## 2017-07-02 NOTE — Assessment & Plan Note (Signed)
HTN, controlled: BP at goal. Renal function normal on blood work from 10/2016.  - Will continue amlodipine 10 mg QD

## 2017-07-03 LAB — MICROALBUMIN / CREATININE URINE RATIO
Creatinine, Urine: 74.2 mg/dL
MICROALB/CREAT RATIO: 5 mg/g{creat} (ref 0.0–30.0)
Microalbumin, Urine: 3.7 ug/mL

## 2017-07-06 NOTE — Progress Notes (Signed)
Internal Medicine Clinic Attending  Case discussed with Dr. Lovenia KimSantos-Sanchez at the time of the visit.  We reviewed the resident's history and exam and pertinent patient test results.  I agree with the assessment, diagnosis, and plan of care documented in the resident's note. Prandin will increase insulin secretion like her sulfonylurea but will have more of a post-prandial effect. Not the best option but options are limited to pt compliance.

## 2017-07-09 ENCOUNTER — Encounter: Payer: Self-pay | Admitting: Internal Medicine

## 2017-07-09 ENCOUNTER — Ambulatory Visit: Payer: Self-pay

## 2017-07-15 ENCOUNTER — Other Ambulatory Visit: Payer: Self-pay | Admitting: Internal Medicine

## 2017-07-15 DIAGNOSIS — I10 Essential (primary) hypertension: Secondary | ICD-10-CM

## 2017-07-28 IMAGING — US US ABDOMEN LIMITED
1 series · 14 of 25 positions shown · non-contrast
Comparison: June 24, 2010

CLINICAL DATA: Elevated liver enzymes

EXAM:
US ABDOMEN LIMITED - RIGHT UPPER QUADRANT

[Series 1: us abdomen limited · 0.22mm/px · 14 of 66 slices shown]
[im 1/66]
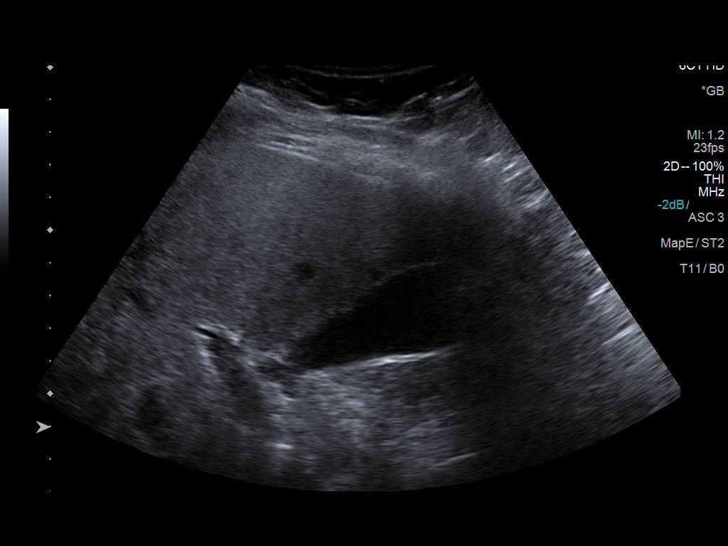
[im 6/66]
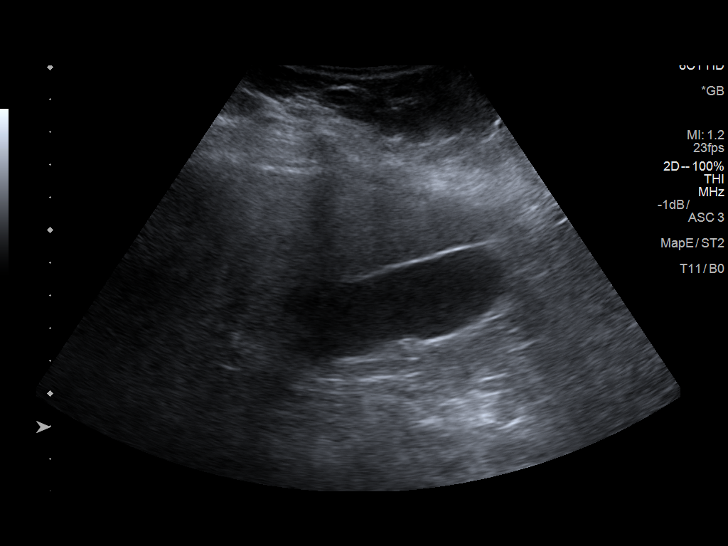
[im 11/66]
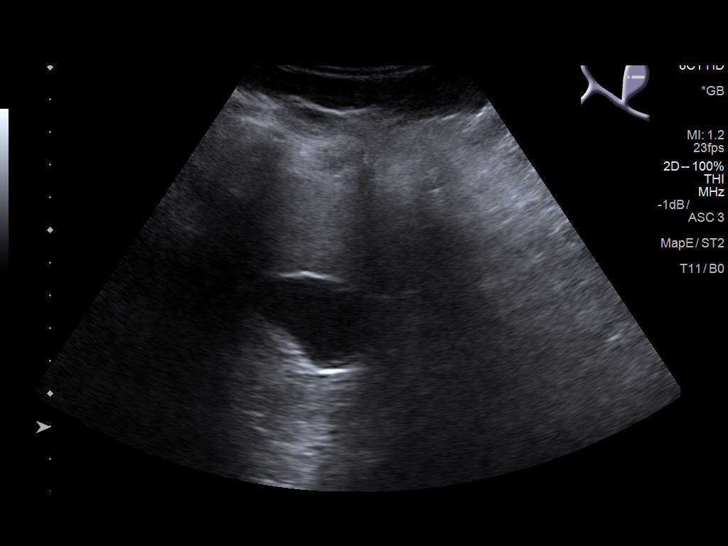
[im 17/66]
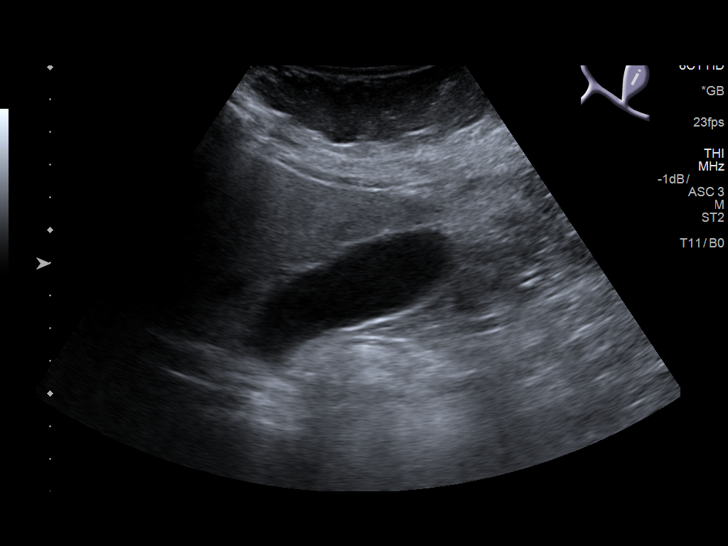
[im 22/66]
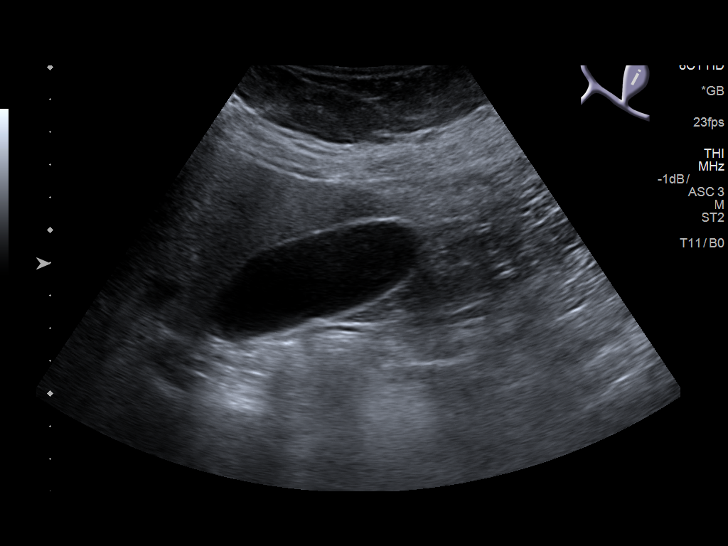
[im 25/66]
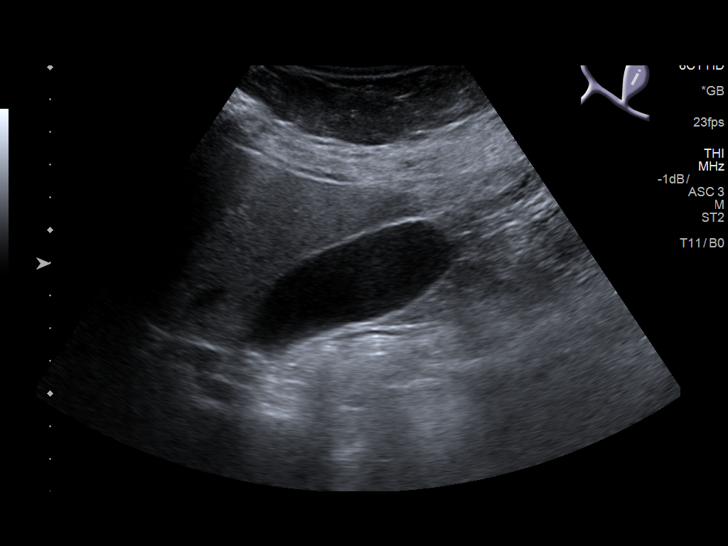
[im 30/66]
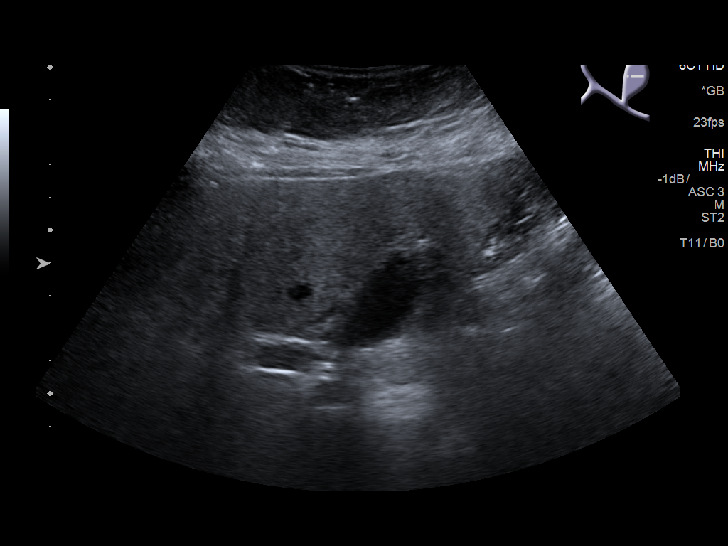
[im 36/66]
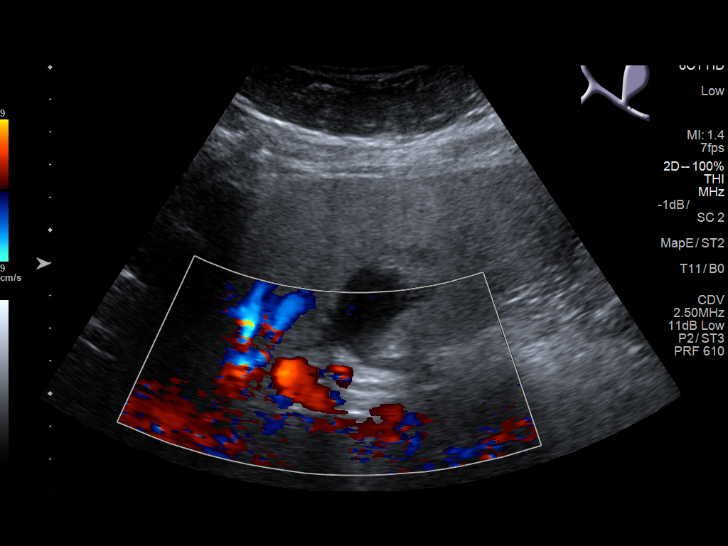
[im 41/66]
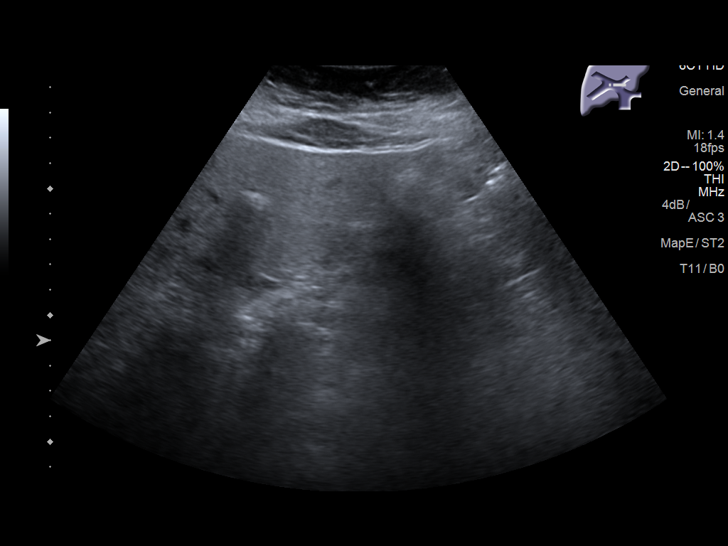
[im 44/66]
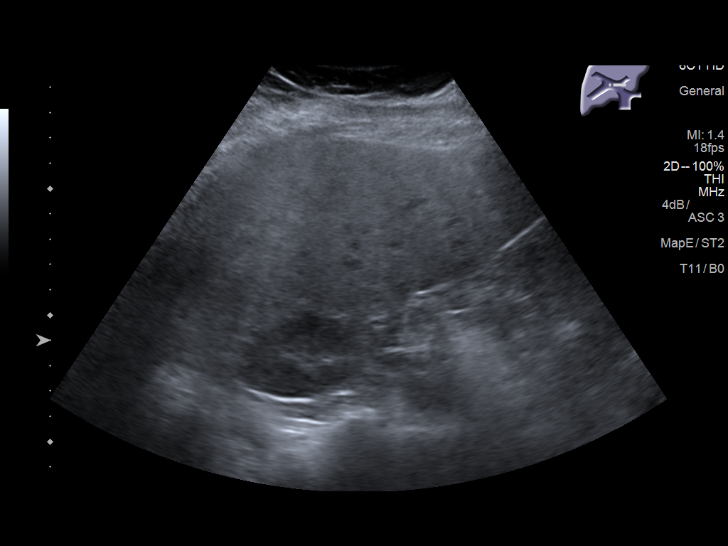
[im 49/66]
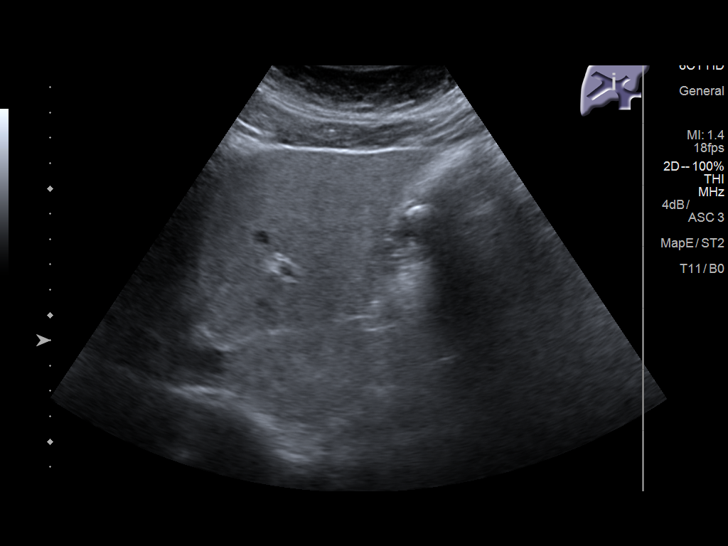
[im 55/66]
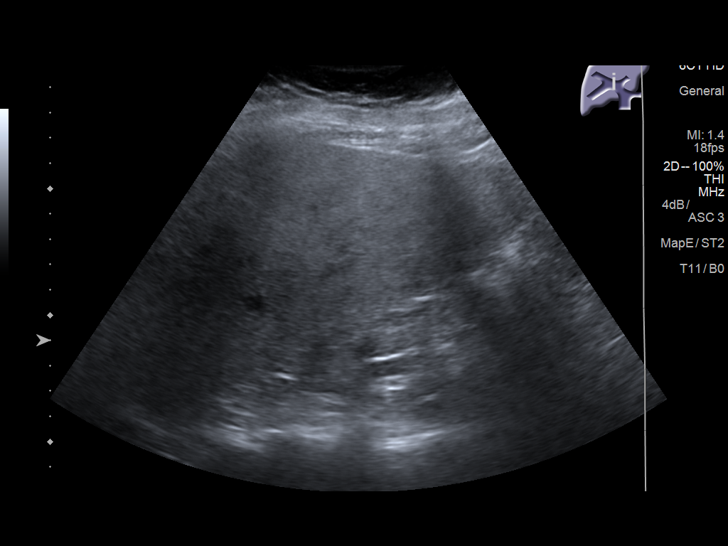
[im 60/66]
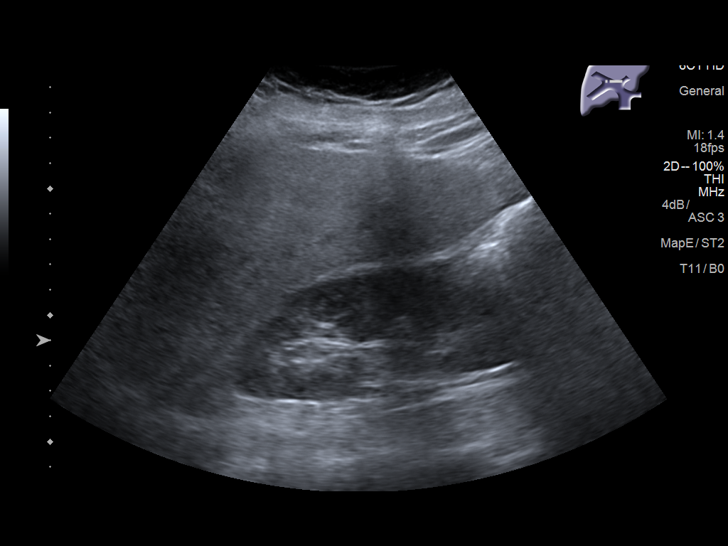
[im 66/66]
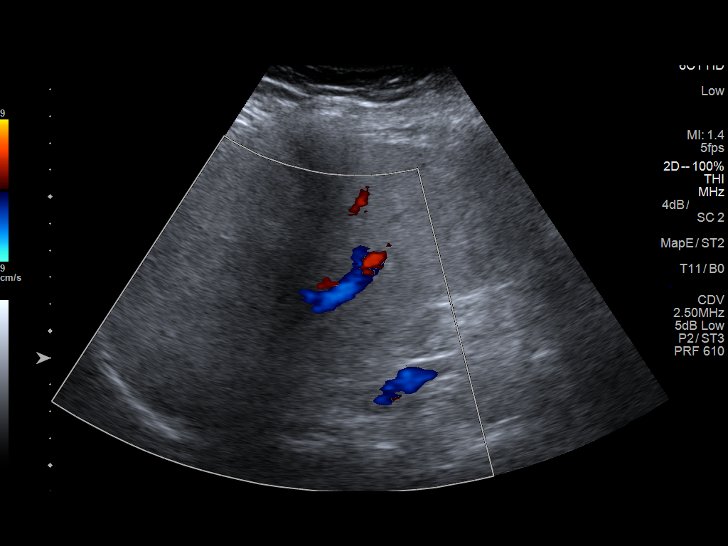

[14 of 25 positions shown; findings below may reference images not displayed]

FINDINGS: Gallbladder:

No gallstones or wall thickening visualized. There is no
pericholecystic fluid. No sonographic Murphy sign noted by
sonographer.

Common bile duct:

Diameter: 3 mm. No intrahepatic or extrahepatic biliary duct
dilatation.

Liver:

No focal lesion identified. Liver echogenicity overall is increased.
IMPRESSION: Increased liver echogenicity, a finding most likely indicative of
hepatic steatosis. While no focal liver lesions are identified, it
must be cautioned that the sensitivity of ultrasound for focal liver
lesions is diminished in this circumstance. Study otherwise
unremarkable.

## 2017-08-04 ENCOUNTER — Ambulatory Visit: Payer: Self-pay

## 2017-08-05 ENCOUNTER — Telehealth: Payer: Self-pay | Admitting: *Deleted

## 2017-08-05 NOTE — Telephone Encounter (Signed)
Thank you for letting me know. Patient is no longer on Novolog. I discontinued it las month because she was not taking it as prescribed. Will consider Apidra in the future if need to resume short-acting insulin.

## 2017-08-05 NOTE — Telephone Encounter (Signed)
Fax from [facility] states Novo Nordisk which makes Novolog "will no longer assist patient who are not legal residents of [place]. Would u consider changing the pt's Novolog to Apidra?" Thanks

## 2017-08-05 NOTE — Telephone Encounter (Signed)
Dr Lovenia KimSantos-Sanchez , I received another [facility] the [facility] "Novo Nordisk will no longer assist pt who are not legal residents of [place]. Would you consider changing Victoza to Adlyxin?" Thanks

## 2017-08-05 NOTE — Telephone Encounter (Signed)
Novolog is still on current med list.Thanks

## 2017-08-06 NOTE — Telephone Encounter (Signed)
Not at this time. Spoke to Dr. Selena BattenKim about patient and issues with medications. She will call patient to discuss changing to [place] as she could potentially get this medication for free. Thank you!

## 2017-08-10 ENCOUNTER — Other Ambulatory Visit: Payer: Self-pay | Admitting: Pharmacist

## 2017-08-10 DIAGNOSIS — E1165 Type 2 diabetes mellitus with hyperglycemia: Secondary | ICD-10-CM

## 2017-08-10 MED ORDER — INSULIN GLULISINE 100 UNIT/ML SOLOSTAR PEN: 16.0000 [IU] | PEN_INJECTOR | Freq: Three times a day (TID) | SUBCUTANEOUS | 11 refills | Status: DC

## 2017-08-10 MED ORDER — LIXISENATIDE 20 MCG/0.2ML ~~LOC~~ SOPN: 20.0000 ug | PEN_INJECTOR | Freq: Every day | SUBCUTANEOUS | 11 refills | Status: DC

## 2017-08-10 MED ORDER — LIXISENATIDE 10 & 20 MCG/0.2ML ~~LOC~~ PNKT: 10.0000 ug | PEN_INJECTOR | Freq: Every day | SUBCUTANEOUS | 0 refills | Status: DC

## 2017-08-10 NOTE — Progress Notes (Signed)
Helping patient with medication access per PCP consult. Interchanged Novolog to Apidra and Victoza to Adlixin due to accessibility for undocumented patients. Will follow up with patient on  medassist access for other medications and continue to assist in care of patients as needed.

## 2017-08-12 ENCOUNTER — Encounter: Payer: Self-pay | Admitting: Pharmacist

## 2017-08-12 NOTE — Progress Notes (Signed)
Spoke to patient through interpreter service, interpreter ID [phone]. [facility] with medication access. Patient was also converted to formulary agents [facility] (Novolog to Apidra, Victoza to Adlixin). She [facility] has [facility]. Will continue to follow along and assist in her care.

## 2017-10-08 ENCOUNTER — Encounter: Payer: Self-pay | Admitting: Internal Medicine

## 2017-10-08 ENCOUNTER — Ambulatory Visit (INDEPENDENT_AMBULATORY_CARE_PROVIDER_SITE_OTHER): Payer: Self-pay | Admitting: Internal Medicine

## 2017-10-08 VITALS — BP 117/71 | HR 104 | Temp 98.3°F

## 2017-10-08 DIAGNOSIS — I10 Essential (primary) hypertension: Secondary | ICD-10-CM

## 2017-10-08 DIAGNOSIS — Z9114 Patient's other noncompliance with medication regimen: Secondary | ICD-10-CM

## 2017-10-08 DIAGNOSIS — E118 Type 2 diabetes mellitus with unspecified complications: Secondary | ICD-10-CM

## 2017-10-08 DIAGNOSIS — Z79899 Other long term (current) drug therapy: Secondary | ICD-10-CM

## 2017-10-08 DIAGNOSIS — E1165 Type 2 diabetes mellitus with hyperglycemia: Secondary | ICD-10-CM

## 2017-10-08 DIAGNOSIS — Z7984 Long term (current) use of oral hypoglycemic drugs: Secondary | ICD-10-CM

## 2017-10-08 LAB — POCT GLYCOSYLATED HEMOGLOBIN (HGB A1C): Hemoglobin A1C: 9.7 % — AB (ref 4.0–5.6)

## 2017-10-08 LAB — GLUCOSE, CAPILLARY: GLUCOSE-CAPILLARY: 114 mg/dL — AB (ref 70–99)

## 2017-10-08 NOTE — Patient Instructions (Addendum)
Robin Klein,   Su A1c es 9.7 hoy. Esto es mejor que la ultima vez que vino pero sigue alta. El numero ideal seria 7 o menos.   Continue tomando Lantus 54 unidades todas las noches.   Use Apidra 16 unidades antes de cada comida. Es bien importante que se inyecte esta medicina 3 veces al dia ya que esto va ayudar a disminuir su A1c.   Continue checandose el azucar 4 veces al dia.   Continue tomando metformina y glipizide como lo hace usualmente.   Saque una cita de seguimiento conmigo en 3 meses.   Llame si tiene alguna pregunta o preocupacion.   - Dr. Evelene Croon

## 2017-10-08 NOTE — Progress Notes (Signed)
   CC: T2DM and HTN follow up   HPI:  [name] is a 47 y.o. year-old female with PMH listed below who presents to clinic for T2DM and HTN follow up. Please see problem based assessment and plan for further details.   Past Medical History:  Diagnosis Date  . Depression   . Diabetes mellitus   . Fatty liver disease, nonalcoholic   . Hypertension   . Sinusitis    Review of Systems:   Review of Systems  Constitutional: Negative for chills, fever and weight loss.  Respiratory: Negative for cough and shortness of breath.   Cardiovascular: Negative for chest pain, palpitations and leg swelling.  Gastrointestinal: Negative for abdominal pain, constipation, diarrhea, nausea and vomiting.  Genitourinary: Negative for frequency and urgency.  Neurological: Negative for dizziness and headaches.   Physical Exam: Vitals:   10/08/17 1521  BP: 117/71  Pulse: (!) 104  Temp: 98.3 F (36.8 C)  TempSrc: Oral  SpO2: 99%    General: pleasant female who appears older than stated age in no acute distress  Cardiac: regular rate and rhythm, nl S1/S2, no murmurs, rubs or gallops  Pulm: CTAB, no wheezes or crackles, no increased work of breathing on room air  Abd: soft, NTND, normoactive bowel sounds  Ext: warm and well perfused, no peripheral edema bilaterally      Office Visit from 10/08/2017 in [facility]  PHQ-9 Total Score  2      Assessment & Plan:   See Encounters Tab for problem based charting.  Patient discussed with Dr. Oswaldo Done

## 2017-10-11 ENCOUNTER — Encounter: Payer: Self-pay | Admitting: Internal Medicine

## 2017-10-11 NOTE — Assessment & Plan Note (Signed)
Essential hypertension: chronic and stable - Continue amlodipine 10 mg QD

## 2017-10-11 NOTE — Assessment & Plan Note (Addendum)
Robin Klein presents for T2DM follow up. She is on Lantus 54 QHS, Apidra 16 TID with meals, Adlyxin QD, metformin, and glipizide. During her last visit with me, she reported she frequently forgets to use her short acting insulin with meals and therefore stopped using it. At that time, I stopped Novolog and started Pandrin to promote compliance. However, she was unable to afford it. As of 2 weeks ago, she started using Apidra 2-3x per day and reports feeling more motivated to being healthy. Her A1c today is 9.7 from 10.2 three months ago. Will continue to work on compliance and follow up in 3 months.   Insulin-dependent T2DM: chronic and uncontrolled - Will continue current regimen and continue working on compliance  - Follow up in 3 months

## 2017-10-11 NOTE — Progress Notes (Signed)
Internal Medicine Clinic Attending  Case discussed with Dr. Santos-Sanchez at the time of the visit.  We reviewed the resident's history and exam and pertinent patient test results.  I agree with the assessment, diagnosis, and plan of care documented in the resident's note.    

## 2017-10-22 ENCOUNTER — Other Ambulatory Visit: Payer: Self-pay | Admitting: *Deleted

## 2017-10-22 DIAGNOSIS — K760 Fatty (change of) liver, not elsewhere classified: Secondary | ICD-10-CM

## 2017-10-25 MED ORDER — ATORVASTATIN CALCIUM 20 MG PO TABS: 20.0000 mg | ORAL_TABLET | Freq: Every day | ORAL | 11 refills | Status: DC

## 2017-10-25 NOTE — Addendum Note (Signed)
Addended by: Burna Cash on: 10/25/2017 03:35 PM   Modules accepted: Orders

## 2017-10-25 NOTE — Telephone Encounter (Signed)
Sorry about that! Did not realize it was set up to print option. Resent prescription.

## 2018-01-07 ENCOUNTER — Encounter (INDEPENDENT_AMBULATORY_CARE_PROVIDER_SITE_OTHER): Payer: Self-pay

## 2018-01-07 ENCOUNTER — Ambulatory Visit (INDEPENDENT_AMBULATORY_CARE_PROVIDER_SITE_OTHER): Payer: Self-pay | Admitting: Internal Medicine

## 2018-01-07 ENCOUNTER — Other Ambulatory Visit: Payer: Self-pay

## 2018-01-07 ENCOUNTER — Encounter: Payer: Self-pay | Admitting: Internal Medicine

## 2018-01-07 ENCOUNTER — Other Ambulatory Visit (HOSPITAL_COMMUNITY)
Admission: RE | Admit: 2018-01-07 | Discharge: 2018-01-07 | Disposition: A | Discharge status: Home / Self Care | Payer: Self-pay | Source: Ambulatory Visit | Attending: Internal Medicine | Admitting: Internal Medicine

## 2018-01-07 ENCOUNTER — Encounter: Payer: Self-pay | Admitting: Dietician

## 2018-01-07 ENCOUNTER — Ambulatory Visit: Payer: Self-pay | Admitting: Dietician

## 2018-01-07 ENCOUNTER — Other Ambulatory Visit: Payer: Self-pay | Admitting: Dietician

## 2018-01-07 VITALS — BP 130/83 | HR 77 | Temp 98.0°F | Ht 60.0 in | Wt 153.6 lb

## 2018-01-07 DIAGNOSIS — E1165 Type 2 diabetes mellitus with hyperglycemia: Secondary | ICD-10-CM

## 2018-01-07 DIAGNOSIS — Z0000 Encounter for general adult medical examination without abnormal findings: Secondary | ICD-10-CM

## 2018-01-07 DIAGNOSIS — Z9114 Patient's other noncompliance with medication regimen: Secondary | ICD-10-CM

## 2018-01-07 DIAGNOSIS — E785 Hyperlipidemia, unspecified: Secondary | ICD-10-CM

## 2018-01-07 DIAGNOSIS — Z79899 Other long term (current) drug therapy: Secondary | ICD-10-CM

## 2018-01-07 DIAGNOSIS — E118 Type 2 diabetes mellitus with unspecified complications: Secondary | ICD-10-CM

## 2018-01-07 DIAGNOSIS — K760 Fatty (change of) liver, not elsewhere classified: Secondary | ICD-10-CM

## 2018-01-07 DIAGNOSIS — IMO0001 Reserved for inherently not codable concepts without codable children: Secondary | ICD-10-CM

## 2018-01-07 DIAGNOSIS — N898 Other specified noninflammatory disorders of vagina: Secondary | ICD-10-CM

## 2018-01-07 DIAGNOSIS — I10 Essential (primary) hypertension: Secondary | ICD-10-CM

## 2018-01-07 DIAGNOSIS — Z794 Long term (current) use of insulin: Secondary | ICD-10-CM

## 2018-01-07 LAB — POCT GLYCOSYLATED HEMOGLOBIN (HGB A1C): Hemoglobin A1C: 12.5 % — AB (ref 4.0–5.6)

## 2018-01-07 LAB — HM DIABETES EYE EXAM

## 2018-01-07 LAB — GLUCOSE, CAPILLARY: Glucose-Capillary: 278 mg/dL — ABNORMAL HIGH (ref 70–99)

## 2018-01-07 NOTE — Assessment & Plan Note (Signed)
Well controlled on amlodipine 10 mg QD. Will continue. Checking renal function today.

## 2018-01-07 NOTE — Progress Notes (Signed)
Retinal images were done and transmitted today. Norm Parcel, RD 01/07/2018 3:20 PM.

## 2018-01-07 NOTE — Assessment & Plan Note (Signed)
Patient found to have a transaminitis in 2017 with AST 67 ALT 106. Alk phos normal at the time. RUQ US showed hepatic steatosis without any focal lesions identified. I will check liver enzymes today. Educated patient on importance of avoiding alcohol intake and weight loss. We are also managing her CV risk factors (HTN and HLD). Per previous labs from 2011, she is not immune to hepatitis B. She declined vaccination today due to recent illness. Will readdress at next visit.

## 2018-01-07 NOTE — Patient Instructions (Addendum)
Laural Roes,   Su A1c esta mas alta de la ultima vez que vino. Subio de 9.7 a 12.5. Esto se debe a que no estuvo usando insulina consistentemente durante el mes pasado cuando estuvo enferma. Es bien importante que use su Lantus todos los dias y la Apidra 3 veces al dia. Tiene que checarse la azucar 3 a 4 veces al dia, preferiblemente antes y despues de comer. Esto me va ayuda a mi a ajustar su dosis de insulina. La voy a referir a Systems analyst para que educacion de diabetes.   Su presion [employer]. Continue tomando amlodipine como lo have usualmente.   Le voy a dar Event organiser con los [address] de sus laboratorios de [place]. Cuando [employer] pruebas vaginales, le dejare saber [place].   Llame a la clinica si tiene alguna duda o pregunta.   - Dra. Evelene Croon

## 2018-01-07 NOTE — Assessment & Plan Note (Signed)
Robin Klein presents for T2DM follow up. She is on Lantus 54 QHS, Apidra 16 TID, Adlyxa 20 mcg, glipizide, and metformin.  Unfortunately her diabetes remains uncontrolled due to medication non-adherence.  Her A1c today is 12.5 from 9.7 three months ago.  She reports feeling sick throughout most of December with a cold and admits to skipping many doses of her short acting insulin due to malaise and poor p.o. intake at the time.  She is now feeling as her usual self and restarted taking her medication as described with the exception of the Apidra which she continues to take twice a day instead of 3 times a day. - I will continue her current regimen as above as her main issue is nonadherence and I am concern for hypoglycemia if we increase her insulin dose and her compliance improves - Eye and foot exams performed today - MNT referral

## 2018-01-07 NOTE — Assessment & Plan Note (Signed)
Patient presents complaining of vaginal itching x 2 months associated with white thick discharge. Endorses vaginal irritation due to severe itching. Denies dysuria and increased urinary frequency. She is sexually active with her husband of 15 years but reports no sexual activity x 6 months. On exam, labia are erythematous and irritated. There is also white thick discharge appreciated.  - Wet prep, will follow up  - Declined STD testing

## 2018-01-07 NOTE — Assessment & Plan Note (Signed)
Declined flu shot and Hep B vaccination.

## 2018-01-07 NOTE — Progress Notes (Signed)
    CC: T2DM, HTN,  HLD, NAFLD follow up and vaginal itching   HPI:  Robin Klein is a 48 y.o. year-old female with PMH listed below who presents to clinic for T2DM, HTN,  HLD, NAFLD follow up and vaginal itching . Please see problem based assessment and plan for further details.   Past Medical History:  Diagnosis Date  . Depression   . Diabetes mellitus   . Fatty liver disease, nonalcoholic   . Hypertension   . Sinusitis    Review of Systems:   Review of Systems  Constitutional: Negative for chills, fever and weight loss.  Respiratory: Negative for cough and shortness of breath.   Gastrointestinal: Negative for abdominal pain, constipation, diarrhea, nausea and vomiting.  Genitourinary: Negative for frequency, hematuria and urgency.       Vaginal itching  Neurological: Negative for dizziness.    Physical Exam: Vitals:   01/07/18 1411  BP: 130/83  Pulse: 77  Temp: 98 F (36.7 C)  SpO2: 100%  Weight: 153 lb 9.6 oz (69.7 kg)  Height: 5' (1.524 m)    General: well-appearing female in no acute distress  HENT: EOM clear without erythema or exudates and pearly white TM, OP clear without exudates or erythema  Cardiac: RRR, nl S1/S2, no murmurs, rubs or gallops  Pulm: CTAB, no wheezes or crackles, no increased work of breathing on room air  Abd: soft, NTND, bowel sounds present   Ext: warm and well perfused, no peripheral edema     Office Visit from 01/07/2018 in [facility]  PHQ-9 Total Score  0      Assessment & Plan:   See Encounters Tab for problem based charting.  Patient discussed with Dr. Rogelia Boga

## 2018-01-07 NOTE — Assessment & Plan Note (Signed)
Currently on atorvastatin 20 mg QD. Will continue. Checking liver enzymes given history of NAFLd with transaminitis noted in 2017.

## 2018-01-08 LAB — CMP14 + ANION GAP
ALK PHOS: 144 IU/L — AB (ref 39–117)
ALT: 92 IU/L — ABNORMAL HIGH (ref 0–32)
AST: 81 IU/L — ABNORMAL HIGH (ref 0–40)
Albumin/Globulin Ratio: 1.3 (ref 1.2–2.2)
Albumin: 4.1 g/dL (ref 3.5–5.5)
Anion Gap: 16 mmol/L (ref 10.0–18.0)
BILIRUBIN TOTAL: 0.5 mg/dL (ref 0.0–1.2)
BUN/Creatinine Ratio: 15 (ref 9–23)
BUN: 9 mg/dL (ref 6–24)
CHLORIDE: 101 mmol/L (ref 96–106)
CO2: 22 mmol/L (ref 20–29)
Calcium: 9 mg/dL (ref 8.7–10.2)
Creatinine, Ser: 0.59 mg/dL (ref 0.57–1.00)
GFR calc Af Amer: 128 mL/min/{1.73_m2} (ref >59)
GFR calc non Af Amer: 111 mL/min/{1.73_m2} (ref >59)
Globulin, Total: 3.2 g/dL (ref 1.5–4.5)
Glucose: 296 mg/dL — ABNORMAL HIGH (ref 65–99)
Potassium: 4 mmol/L (ref 3.5–5.2)
Sodium: 139 mmol/L (ref 134–144)
TOTAL PROTEIN: 7.3 g/dL (ref 6.0–8.5)

## 2018-01-08 LAB — LIPID PANEL
Chol/HDL Ratio: 3.1 ratio (ref 0.0–4.4)
Cholesterol, Total: 126 mg/dL (ref 100–199)
HDL: 41 mg/dL (ref >39)
LDL Calculated: 57 mg/dL (ref 0–99)
Triglycerides: 138 mg/dL (ref 0–149)
VLDL Cholesterol Cal: 28 mg/dL (ref 5–40)

## 2018-01-10 ENCOUNTER — Other Ambulatory Visit: Payer: Self-pay | Admitting: Internal Medicine

## 2018-01-10 DIAGNOSIS — IMO0001 Reserved for inherently not codable concepts without codable children: Secondary | ICD-10-CM

## 2018-01-10 DIAGNOSIS — E1165 Type 2 diabetes mellitus with hyperglycemia: Principal | ICD-10-CM

## 2018-01-10 LAB — CERVICOVAGINAL ANCILLARY ONLY
Bacterial vaginitis: NEGATIVE
Candida vaginitis: POSITIVE — AB
Trichomonas: NEGATIVE

## 2018-01-11 NOTE — Progress Notes (Signed)
Internal Medicine Clinic Attending  Case discussed with Dr. Santos-Sanchez at the time of the visit.  We reviewed the resident's history and exam and pertinent patient test results.  I agree with the assessment, diagnosis, and plan of care documented in the resident's note.    

## 2018-01-11 NOTE — Telephone Encounter (Signed)
Next appt scheduled 04/08/18 with PCP.

## 2018-01-12 ENCOUNTER — Encounter: Payer: Self-pay | Admitting: Dietician

## 2018-01-12 MED ORDER — FLUCONAZOLE 150 MG PO TABS: ORAL_TABLET | ORAL | 0 refills | Status: DC

## 2018-01-12 NOTE — Addendum Note (Signed)
Addended by: Burna Cash on: 01/12/2018 04:37 PM   Modules accepted: Orders

## 2018-01-26 ENCOUNTER — Encounter: Payer: Self-pay | Admitting: Internal Medicine

## 2018-01-26 ENCOUNTER — Ambulatory Visit: Payer: Self-pay

## 2018-01-31 ENCOUNTER — Encounter: Payer: Self-pay | Admitting: Dietician

## 2018-02-03 ENCOUNTER — Telehealth: Payer: Self-pay | Admitting: Dietician

## 2018-02-03 NOTE — Telephone Encounter (Signed)
Called patient, but no answer. Patient was interested in talking briefly about her diet. Left a voicemail reminding her that she is welcome to contact Lupita Leash whenever she has any questions concerning nutrition.   Eliott Nine 02/03/2018 2:51 PM.

## 2018-02-07 ENCOUNTER — Other Ambulatory Visit: Payer: Self-pay | Admitting: Internal Medicine

## 2018-02-07 DIAGNOSIS — IMO0001 Reserved for inherently not codable concepts without codable children: Secondary | ICD-10-CM

## 2018-02-07 DIAGNOSIS — E1165 Type 2 diabetes mellitus with hyperglycemia: Principal | ICD-10-CM

## 2018-02-09 ENCOUNTER — Ambulatory Visit: Payer: Self-pay

## 2018-04-07 ENCOUNTER — Other Ambulatory Visit: Payer: Self-pay | Admitting: Internal Medicine

## 2018-04-07 DIAGNOSIS — I10 Essential (primary) hypertension: Secondary | ICD-10-CM

## 2018-04-08 ENCOUNTER — Encounter: Payer: Self-pay | Admitting: Internal Medicine

## 2018-07-04 ENCOUNTER — Other Ambulatory Visit: Payer: Self-pay | Admitting: Internal Medicine

## 2018-07-04 DIAGNOSIS — IMO0001 Reserved for inherently not codable concepts without codable children: Secondary | ICD-10-CM

## 2018-09-10 ENCOUNTER — Other Ambulatory Visit: Payer: Self-pay | Admitting: Internal Medicine

## 2018-09-10 DIAGNOSIS — IMO0001 Reserved for inherently not codable concepts without codable children: Secondary | ICD-10-CM

## 2018-12-05 ENCOUNTER — Encounter: Payer: Self-pay | Admitting: Internal Medicine

## 2018-12-05 ENCOUNTER — Ambulatory Visit: Payer: Self-pay | Admitting: Internal Medicine

## 2018-12-05 VITALS — BP 144/92 | HR 80 | Temp 98.6°F | Ht 60.0 in | Wt 147.0 lb

## 2018-12-05 DIAGNOSIS — R631 Polydipsia: Secondary | ICD-10-CM

## 2018-12-05 DIAGNOSIS — E118 Type 2 diabetes mellitus with unspecified complications: Secondary | ICD-10-CM

## 2018-12-05 DIAGNOSIS — Z794 Long term (current) use of insulin: Secondary | ICD-10-CM

## 2018-12-05 DIAGNOSIS — R350 Frequency of micturition: Secondary | ICD-10-CM

## 2018-12-05 DIAGNOSIS — E119 Type 2 diabetes mellitus without complications: Secondary | ICD-10-CM

## 2018-12-05 DIAGNOSIS — R3915 Urgency of urination: Secondary | ICD-10-CM

## 2018-12-05 LAB — POCT GLYCOSYLATED HEMOGLOBIN (HGB A1C): HbA1c POC (<> result, manual entry): >14 % — AB (ref 4.0–5.6)

## 2018-12-05 LAB — GLUCOSE, CAPILLARY: Glucose-Capillary: 334 mg/dL — ABNORMAL HIGH (ref 70–99)

## 2018-12-05 MED ORDER — APIDRA SOLOSTAR 100 UNIT/ML ~~LOC~~ SOPN: 16.0000 [IU] | PEN_INJECTOR | Freq: Three times a day (TID) | SUBCUTANEOUS | 11 refills | Status: DC

## 2018-12-05 MED ORDER — GLIPIZIDE 10 MG PO TABS: ORAL_TABLET | ORAL | 0 refills | Status: DC

## 2018-12-05 MED ORDER — LANTUS SOLOSTAR 100 UNIT/ML ~~LOC~~ SOPN: PEN_INJECTOR | SUBCUTANEOUS | 11 refills | Status: DC

## 2018-12-05 MED ORDER — PEN NEEDLES 31G X 5 MM MISC: 1.0000 | Freq: Three times a day (TID) | 11 refills | Status: DC

## 2018-12-05 MED ORDER — METFORMIN HCL 1000 MG PO TABS: 1000.0000 mg | ORAL_TABLET | Freq: Two times a day (BID) | ORAL | 3 refills | Status: DC

## 2018-12-05 NOTE — Patient Instructions (Addendum)
Robin Klein,    Su A1c esta demasiada alta. Esto se debe a que no ha usado [employer]. Le envie rellenados de todos sus medicamentos de diabetes. Tambien le hicimos analisis de [place] y de [place] para Therapist, nutritional sus riones.   Haga cita de seguimiento conmigo en 3 meses para checar su A1c de nuevo.   [name]

## 2018-12-06 ENCOUNTER — Encounter: Payer: Self-pay | Admitting: Internal Medicine

## 2018-12-06 DIAGNOSIS — Z794 Long term (current) use of insulin: Secondary | ICD-10-CM | POA: Insufficient documentation

## 2018-12-06 LAB — MICROALBUMIN / CREATININE URINE RATIO
Creatinine, Urine: 44.8 mg/dL
Microalb/Creat Ratio: 13 mg/g creat (ref 0–29)
Microalbumin, Urine: 5.9 ug/mL

## 2018-12-06 NOTE — Progress Notes (Signed)
Internal Medicine Clinic Attending  Case discussed with Dr. Santos-Sanchez at the time of the visit.  We reviewed the resident's history and exam and pertinent patient test results.  I agree with the assessment, diagnosis, and plan of care documented in the resident's note.  Alexander Raines, M.D., Ph.D.  

## 2018-12-06 NOTE — Progress Notes (Signed)
   CC: T2DM follow up  HPI:  Robin Klein is a 48 y.o. year-old female with PMH listed below who presents to clinic for T2DM follow up. Please see problem based assessment and plan for further details.   Past Medical History:  Diagnosis Date  . Depression   . Diabetes mellitus   . Fatty liver disease, nonalcoholic   . Hypertension   . Sinusitis    Review of Systems:   Review of Systems  Gastrointestinal: Negative for abdominal pain, nausea and vomiting.  Genitourinary: Positive for frequency and urgency.  Neurological: Negative for dizziness and headaches.  Endo/Heme/Allergies: Positive for polydipsia.    Physical Exam:  Vitals:   12/05/18 1037  BP: (!) 144/92  Pulse: 80  Temp: 98.6 F (37 C)  TempSrc: Oral  SpO2: 100%  Weight: 147 lb (66.7 kg)  Height: 5' (1.524 m)    General: well-appearing female in NAD Cardiac: regular rate and rhythm, nl S1/S2, no murmurs, rubs or gallops Pulm: CTAB, no wheezes or crackles, no increased work of breathing on room air  Ext: warm and well perfused, no peripheral edema  Assessment & Plan:   See Encounters Tab for problem based charting.  Patient discussed with Dr. Rebeca Alert

## 2018-12-06 NOTE — Assessment & Plan Note (Addendum)
Patient presents for follow up of uncontrolled T2DM. She ran out of Lantus and Apidra 2 months ago. She also stopped taking metformin after seeing commercials of the recall. She reports polydipsia and polyuria. No abdominal pain or N/V. Has not checked her BG this month. Last reading on the meter was 658 six weeks ago. A1c > 14.   - Refilled Lantus 54 QHS and Apidra 16 TID w/ meals, 12 RF provided  - Discussed complications of long term uncontrolled T2DM  - Refilled metformin and glipizide, 12 RF provided  - BMP and beta hydroxyburate to check for DKA  - Follow up in 3 months  - Urine microalbumin collected, foot exam performed today

## 2018-12-08 LAB — BETA-HYDROXYBUTYRIC ACID: Beta-Hydroxybutyrate: 4.9 mg/dL — ABNORMAL HIGH

## 2018-12-08 LAB — BMP8+ANION GAP
Anion Gap: 14 mmol/L (ref 10.0–18.0)
BUN/Creatinine Ratio: 18 (ref 9–23)
BUN: 10 mg/dL (ref 6–24)
CO2: 21 mmol/L (ref 20–29)
Calcium: 8.9 mg/dL (ref 8.7–10.2)
Chloride: 102 mmol/L (ref 96–106)
Creatinine, Ser: 0.56 mg/dL — ABNORMAL LOW (ref 0.57–1.00)
GFR calc Af Amer: 129 mL/min/{1.73_m2} (ref >59)
GFR calc non Af Amer: 112 mL/min/{1.73_m2} (ref >59)
Glucose: 360 mg/dL — ABNORMAL HIGH (ref 65–99)
Potassium: 4.1 mmol/L (ref 3.5–5.2)
Sodium: 137 mmol/L (ref 134–144)

## 2018-12-14 ENCOUNTER — Ambulatory Visit: Payer: Self-pay

## 2018-12-28 ENCOUNTER — Other Ambulatory Visit: Payer: Self-pay | Admitting: Internal Medicine

## 2018-12-28 DIAGNOSIS — Z794 Long term (current) use of insulin: Secondary | ICD-10-CM

## 2018-12-28 DIAGNOSIS — E119 Type 2 diabetes mellitus without complications: Secondary | ICD-10-CM

## 2018-12-28 MED ORDER — METFORMIN HCL 1000 MG PO TABS: 1000.0000 mg | ORAL_TABLET | Freq: Two times a day (BID) | ORAL | 3 refills | Status: DC

## 2018-12-28 MED ORDER — APIDRA SOLOSTAR 100 UNIT/ML ~~LOC~~ SOPN: 16.0000 [IU] | PEN_INJECTOR | Freq: Three times a day (TID) | SUBCUTANEOUS | 11 refills | Status: DC

## 2018-12-28 MED ORDER — GLIPIZIDE 10 MG PO TABS: ORAL_TABLET | ORAL | 0 refills | Status: DC

## 2018-12-28 MED ORDER — PEN NEEDLES 31G X 5 MM MISC: 1.0000 | Freq: Three times a day (TID) | 11 refills | Status: DC

## 2018-12-28 MED ORDER — LANTUS SOLOSTAR 100 UNIT/ML ~~LOC~~ SOPN: PEN_INJECTOR | SUBCUTANEOUS | 11 refills | Status: DC

## 2018-12-28 NOTE — Telephone Encounter (Signed)
Needs refill on ;pt contact [phone]   The prescription needs to go through the MAPP program   Insulin Glargine (LANTUS SOLOSTAR) 100 UNIT/ML Solostar Pen     Insulin Glulisine (APIDRA SOLOSTAR) 100 UNIT/ML Solostar Pen    Insulin Pen Needle (PEN NEEDLES) 31G X 5 MM MISC    Insulin Syringe-Needle U-100 (GNP INSULIN SYRINGE) 31G X 5/16" 0.5 ML MISC    Pls contact pt

## 2018-12-28 NOTE — Telephone Encounter (Signed)
Called pt - talked to her husband; requesting meds sent to [facility] instead of [facility]?? I called [place] - talked to [facility]; stated pt has been dis-enrolled since July b/c she has not provide them necessary paperwork. I called the husband; explained the situation - requesting insulin rxs sent to [facility] on [address]. Thanks

## 2019-02-27 ENCOUNTER — Encounter: Payer: Self-pay | Admitting: Internal Medicine

## 2019-02-27 ENCOUNTER — Ambulatory Visit: Payer: Self-pay | Admitting: Internal Medicine

## 2019-02-27 VITALS — BP 126/80 | HR 85 | Temp 98.7°F | Ht 60.0 in | Wt 142.1 lb

## 2019-02-27 DIAGNOSIS — E1169 Type 2 diabetes mellitus with other specified complication: Secondary | ICD-10-CM

## 2019-02-27 DIAGNOSIS — Z79899 Other long term (current) drug therapy: Secondary | ICD-10-CM

## 2019-02-27 DIAGNOSIS — K760 Fatty (change of) liver, not elsewhere classified: Secondary | ICD-10-CM

## 2019-02-27 DIAGNOSIS — R519 Headache, unspecified: Secondary | ICD-10-CM

## 2019-02-27 DIAGNOSIS — E1165 Type 2 diabetes mellitus with hyperglycemia: Secondary | ICD-10-CM

## 2019-02-27 DIAGNOSIS — R631 Polydipsia: Secondary | ICD-10-CM

## 2019-02-27 DIAGNOSIS — Z794 Long term (current) use of insulin: Secondary | ICD-10-CM

## 2019-02-27 DIAGNOSIS — Z9114 Patient's other noncompliance with medication regimen: Secondary | ICD-10-CM

## 2019-02-27 LAB — GLUCOSE, CAPILLARY: Glucose-Capillary: 505 mg/dL (ref 70–99)

## 2019-02-27 LAB — POCT GLYCOSYLATED HEMOGLOBIN (HGB A1C): Hemoglobin A1C: 13.7 % — AB (ref 4.0–5.6)

## 2019-02-27 MED ORDER — APIDRA SOLOSTAR 100 UNIT/ML ~~LOC~~ SOPN: 16.0000 [IU] | PEN_INJECTOR | Freq: Three times a day (TID) | SUBCUTANEOUS | 11 refills | Status: DC

## 2019-02-27 MED ORDER — LANTUS SOLOSTAR 100 UNIT/ML ~~LOC~~ SOPN: PEN_INJECTOR | SUBCUTANEOUS | 11 refills | Status: DC

## 2019-02-27 MED ORDER — INSULIN ASPART 100 UNIT/ML ~~LOC~~ SOLN
10.0000 [IU] | Freq: Once | SUBCUTANEOUS | Status: AC
  Administered 2019-02-27: 16:00:00 10 [IU] via SUBCUTANEOUS

## 2019-02-27 MED ORDER — GLIPIZIDE 10 MG PO TABS: ORAL_TABLET | ORAL | 1 refills | Status: DC

## 2019-02-27 MED ORDER — ATORVASTATIN CALCIUM 40 MG PO TABS: 40.0000 mg | ORAL_TABLET | Freq: Every day | ORAL | 10 refills | Status: DC

## 2019-02-27 MED ORDER — ADLYXIN 20 MCG/0.2ML ~~LOC~~ SOPN: 20.0000 ug | PEN_INJECTOR | Freq: Every day | SUBCUTANEOUS | 11 refills | Status: DC

## 2019-02-27 NOTE — Patient Instructions (Addendum)
Robin Klein,   Le envie refills de todos sus medicamentos al MAP. Como no sabemos cuanto se Zenaida Niece a tardar para aprobar sus papeles, le di muestras de [place] para que tenga para la proxima semana.   1- Inyectese Basaglar 50 unidades todos los dias una vez al dia  2- Inyectese Novolog 10 unidades tres veces al dia con sus comidas  Le envie el rellenado de glipizide a [facility]. Se supone que el costo sea $4 solamente.   Haga una cita de seguimiento conmigo en 3 meses.   - Dr. Evelene Croon

## 2019-02-28 LAB — BMP8+ANION GAP
Anion Gap: 14 mmol/L (ref 10.0–18.0)
BUN/Creatinine Ratio: 18 (ref 9–23)
BUN: 11 mg/dL (ref 6–24)
CO2: 19 mmol/L — ABNORMAL LOW (ref 20–29)
Calcium: 9.2 mg/dL (ref 8.7–10.2)
Chloride: 99 mmol/L (ref 96–106)
Creatinine, Ser: 0.62 mg/dL (ref 0.57–1.00)
GFR calc Af Amer: 125 mL/min/{1.73_m2} (ref >59)
GFR calc non Af Amer: 108 mL/min/{1.73_m2} (ref >59)
Glucose: 518 mg/dL (ref 65–99)
Potassium: 4.8 mmol/L (ref 3.5–5.2)
Sodium: 132 mmol/L — ABNORMAL LOW (ref 134–144)

## 2019-02-28 NOTE — Assessment & Plan Note (Signed)
Refilled atorvastatin 40 mg QD which she is compliant with.

## 2019-02-28 NOTE — Assessment & Plan Note (Signed)
Patient has a history of uncontrolled T2DM due to medication non-adherence. Last time I saw her she had run out her insulin 2 months prior.  She was not checking her BG consistently at home and readings were 500-600. Labs not consistent with DKA at that time. I refilled her medications and strongly encouraged her to be compliant. She presents today for follow up and tells me she has been out of her insulin x 6 months. She receives her medicine through the MAP program from [facility] and had not turn in the appropriate documentation to renew until today. BG today in the 500s with A1c 13.7. She reports a mild HA and increased thirst, but no vision changes or GI/urinary symptoms. She has lost 7 lbs since prior visit. We discussed the long term complication of uncontrolled T2DM (blindness, amputations, dialysis, DKA, and death) and I told her she is at very high risk for these given her poor glycemic control.   - Novolog 10 units x1 - Declined IVF due to time constraints  - Gave sample of Basaglar and Novolog to last 2-3 weeks and advised her to call us if she runs out and has not yet been approved for the MAP program  - Encouraged increased water intake and consistent BG checks  - Refilled Lantus, Apidra, Adlyxin (GLP-1), and glipizide - Does not feel comfortable taking metformin due to recent recall though I did explain it is safe to take  - BMP, not ordered STAT as she could not stay in clinic  I reviewed labs. She is mildly acidotic with bicarb of 19 and AG 14. I will call her tomorrow to see how she is responding to insulin we provided her with and assess for DKA symptoms.

## 2019-02-28 NOTE — Progress Notes (Signed)
   CC: T2DM follow up  HPI:  Robin Klein is a 49 y.o. year-old female with PMH listed below who presents to clinic for T2DM follow up. Please see problem based assessment and plan for further details.   Past Medical History:  Diagnosis Date  . Depression   . Diabetes mellitus   . Fatty liver disease, nonalcoholic   . Hypertension   . Sinusitis    Review of Systems:   Review of Systems  Gastrointestinal: Negative for abdominal pain, nausea and vomiting.  Genitourinary: Negative for frequency and urgency.  Neurological: Positive for headaches. Negative for dizziness.  Endo/Heme/Allergies: Positive for polydipsia.    Physical Exam:  Vitals:   02/27/19 1534  BP: 126/80  Pulse: 85  Temp: 98.7 F (37.1 C)  TempSrc: Oral  SpO2: 100%  Weight: 142 lb 1.6 oz (64.5 kg)  Height: 5' (1.524 m)    General: well appearing female in NAD  HENT: NCAT, OP clear without exudates or erythema, dry MM  Cardiac: regular rate and rhythm, nl S1/S2, no murmurs, rubs or gallops Pulm: CTAB, no wheezes or crackles, no increased work of breathing on room air  Abd: soft, NTND, bowel sounds are normoactive   Ext: warm and well perfused, no pitting edema    Assessment & Plan:   See Encounters Tab for problem based charting.  Patient discussed with Dr. Rogelia Boga

## 2019-03-03 NOTE — Progress Notes (Signed)
Internal Medicine Clinic Attending  Case discussed with Dr. Santos at the time of the visit.  We reviewed the resident's history and exam and pertinent patient test results.  I agree with the assessment, diagnosis, and plan of care documented in the resident's note.    

## 2019-03-08 ENCOUNTER — Telehealth: Payer: Self-pay

## 2019-03-08 DIAGNOSIS — E1165 Type 2 diabetes mellitus with hyperglycemia: Secondary | ICD-10-CM

## 2019-03-08 NOTE — Telephone Encounter (Signed)
Called Timi using [employer] interpreters, no answer. I think they want a refill on lantus which was done but it was sent to [facility]. She no longer appears to have [facility] (ended 02/04/2018) and may need help with medicine cost. Consider sending all prescription to [facility] outpatient pharmacy if she is uninsured. Please follow up with patient.

## 2019-03-08 NOTE — Telephone Encounter (Addendum)
Pt's husband called for his wife, states she needs a refill on one of her insulin medications which she takes at night.  Per med list, pt taking multiple diabetic medications.  At LOV, samples were given due to pt awaiting approval for MAP program. Will forward to Lupita Leash and ask if she could please try to assist pt and family.  Husband's name is Raenette Rover at [phone] Thank you, Penne Lash, RN,BSN

## 2019-03-09 NOTE — Telephone Encounter (Signed)
Dr. Mordecai Maes, When I spoke with this patient's husband, he could not tell me which insulin he needed a refill on because "they threw away all the boxes".  The only thing he could tell me is it was the injection she used at night.  I am thinking it is the Lantus as well, but can't be sure (please see messages below).  Would this pt be a candidate for the IM program at [facility] outpatient pharmacy?  Do you mind calling this pt or resending the RX's to [facility] outpatient pharmacy if appropriate?  Please let me know if you would rather pt come in for a visit and bring her meds with her for medication management. Thank you, SChaplin, RN,BSN

## 2019-03-09 NOTE — Telephone Encounter (Signed)
Late entry for 1400:  TC to patient, no answer. 1410:  TC to [facility], message left to call triage back regarding where pt is in process of reapplying.  No samples available at [facility] for Lixisenatide, Glulisine (apidra), or Lantus.   Dr. Mordecai Maes, do you want to sent them to [facility] /IM program in case pt can pick them up tomorrow?  Lupita Leash did mention she did not think [facility]-IM program covered lixisenatide and this would need to be changed to Victoza. Thanks, SChaplin, RN,BSN

## 2019-03-09 NOTE — Telephone Encounter (Signed)
When I saw her recently she told me she had been out of insulin x 6 months. So not sure what boxes they threw away. Just tried to call her but was not able to reach her. She receives medications through the MAP program at [facility] which is why I sent the prescription there. She was, however, in the process of reapplying for it so it may be that she has not completed the process yet. I am happy to send both long and short acting insulin to [facility] while she completes the process as she is at high risk for DKA. If not able to afford them at [facility], she should come to clinic for free samples while we sort this out.

## 2019-03-10 ENCOUNTER — Other Ambulatory Visit: Payer: Self-pay | Admitting: Internal Medicine

## 2019-03-10 DIAGNOSIS — E1165 Type 2 diabetes mellitus with hyperglycemia: Secondary | ICD-10-CM

## 2019-03-10 DIAGNOSIS — E119 Type 2 diabetes mellitus without complications: Secondary | ICD-10-CM

## 2019-03-10 DIAGNOSIS — Z794 Long term (current) use of insulin: Secondary | ICD-10-CM

## 2019-03-10 MED ORDER — NOVOLOG FLEXPEN 100 UNIT/ML ~~LOC~~ SOPN: 16.0000 [IU] | PEN_INJECTOR | Freq: Three times a day (TID) | SUBCUTANEOUS | 3 refills | Status: DC

## 2019-03-10 MED ORDER — LIRAGLUTIDE 18 MG/3ML ~~LOC~~ SOPN: 1.2000 mg | PEN_INJECTOR | Freq: Every day | SUBCUTANEOUS | 1 refills | Status: DC

## 2019-03-10 MED ORDER — LANTUS SOLOSTAR 100 UNIT/ML ~~LOC~~ SOPN: PEN_INJECTOR | SUBCUTANEOUS | 1 refills | Status: DC

## 2019-03-10 MED ORDER — APIDRA SOLOSTAR 100 UNIT/ML ~~LOC~~ SOPN: 16.0000 [IU] | PEN_INJECTOR | Freq: Three times a day (TID) | SUBCUTANEOUS | 1 refills | Status: DC

## 2019-03-10 MED ORDER — PEN NEEDLES 31G X 5 MM MISC: 1.0000 | Freq: Three times a day (TID) | 11 refills | Status: DC

## 2019-03-10 MED FILL — NOVOLOG FLEXPEN SYRINGE: 100 | 25 days supply | Qty: 12 | Fill #0

## 2019-03-10 MED FILL — UNIFINE PENTIPS 31GX3/16: 31G X 5 MM | 25 days supply | Qty: 100 | Fill #0

## 2019-03-10 MED FILL — VICTOZA 2-PAK 18 MG/3 ML PE: 18 | 30 days supply | Qty: 6 | Fill #0

## 2019-03-10 MED FILL — LANTUS SOLOSTAR 100 UNITS/M: 100 | 27 days supply | Qty: 15 | Fill #0

## 2019-03-10 NOTE — Telephone Encounter (Signed)
Received TC from [facility] at [facility], she states pt has not picked up any RX's sine 07/2018.  RN spoke with N.Williams, [facility] financial counselor, she states pt had an appt with her 12/2018, but failed to submit any of the required paperwork back to her.  Per Marnette Burgess, she will mail a letter to patient (in spanish) requesting pt schedule new appt with financial counselor as she will have to start paperwork for [facility]. Will forward PCP with update. SChaplin, RN,BSN

## 2019-03-10 NOTE — Telephone Encounter (Signed)
TC to pt's husband, [place].  Informed him that his wife will need to start the application process for the orange card over again and they should be getting some information in the mail, in spanish, with instructions on making this appt with [facility] financial counselor soon. Informed husband that RX's were transferred to [facility] under IM program and that they should be $4/RX, husband voices appreciation and states he will be able to pick these up.  Directions to [facility] given to husband.  Explained to husband RX's cannot be picked up from [facility] until his wife is approved for orange card and he verbalized understanding. SChaplin, RN,BSN

## 2019-03-10 NOTE — Telephone Encounter (Signed)
Sent prescriptions for Lantus, Apidra, and Victoza to [facility] under IM Program. If patient not able to pick up or afford she should still be able to come to clinic for free samples. We can always substitute her medications for the ones we have. I gave her basaglar and novolog during her last visit.

## 2019-03-13 MED FILL — ATORVASTATIN 40 MG TABLET: 40 | 30 days supply | Qty: 30 | Fill #0

## 2019-03-13 MED FILL — glipiZIDE 10 MG TABS: 10 | 30 days supply | Qty: 60 | Fill #0

## 2019-04-10 ENCOUNTER — Telehealth: Payer: Self-pay

## 2019-04-10 MED FILL — LANTUS SOLOSTAR 100 UNITS/M: 100 | 27 days supply | Qty: 15 | Fill #1

## 2019-04-10 NOTE — Telephone Encounter (Signed)
Lantus 5 pens with 1 refill sent 03/10/2019. Confirmed with Nicki Guadalajara at [facility] they have this Rx and will get it ready for her. Kinnie Feil, BSN, RN-BC

## 2019-04-10 NOTE — Telephone Encounter (Signed)
insulin glargine (LANTUS SOLOSTAR) 100 UNIT/ML Solostar Pen, REFILL REQUEST @  [facility] - [place], [place] - [address]. [phone] (Phone) [phone] (Fax)

## 2019-05-02 ENCOUNTER — Encounter: Payer: Self-pay | Admitting: *Deleted

## 2019-05-22 ENCOUNTER — Other Ambulatory Visit: Payer: Self-pay

## 2019-05-22 DIAGNOSIS — E1165 Type 2 diabetes mellitus with hyperglycemia: Secondary | ICD-10-CM

## 2019-05-22 MED ORDER — LANTUS SOLOSTAR 100 UNIT/ML ~~LOC~~ SOPN: PEN_INJECTOR | SUBCUTANEOUS | 12 refills | Status: DC

## 2019-05-22 MED FILL — LANTUS SOLOSTAR 100 UNITS/M: 100 | 28 days supply | Qty: 15 | Fill #0

## 2019-05-22 NOTE — Telephone Encounter (Signed)
insulin glargine (LANTUS SOLOSTAR) 100 UNIT/ML Solostar Pen   Refill request @  [facility] - [place], [place] - [address]. [phone] (Phone) [phone] (Fax)

## 2019-05-29 ENCOUNTER — Ambulatory Visit: Payer: Self-pay | Admitting: Internal Medicine

## 2019-05-29 ENCOUNTER — Other Ambulatory Visit: Payer: Self-pay | Admitting: Internal Medicine

## 2019-05-29 ENCOUNTER — Other Ambulatory Visit: Payer: Self-pay

## 2019-05-29 ENCOUNTER — Encounter: Payer: Self-pay | Admitting: Internal Medicine

## 2019-05-29 VITALS — BP 136/83 | HR 92 | Temp 98.5°F | Wt 154.3 lb

## 2019-05-29 DIAGNOSIS — E785 Hyperlipidemia, unspecified: Secondary | ICD-10-CM

## 2019-05-29 DIAGNOSIS — I10 Essential (primary) hypertension: Secondary | ICD-10-CM

## 2019-05-29 DIAGNOSIS — Z7984 Long term (current) use of oral hypoglycemic drugs: Secondary | ICD-10-CM

## 2019-05-29 DIAGNOSIS — E1165 Type 2 diabetes mellitus with hyperglycemia: Secondary | ICD-10-CM

## 2019-05-29 DIAGNOSIS — Z794 Long term (current) use of insulin: Secondary | ICD-10-CM

## 2019-05-29 LAB — POCT GLYCOSYLATED HEMOGLOBIN (HGB A1C): Hemoglobin A1C: 12.6 % — AB (ref 4.0–5.6)

## 2019-05-29 LAB — GLUCOSE, CAPILLARY: Glucose-Capillary: 424 mg/dL — ABNORMAL HIGH (ref 70–99)

## 2019-05-29 MED ORDER — GLIPIZIDE 10 MG PO TABS: ORAL_TABLET | ORAL | 11 refills | Status: DC

## 2019-05-29 MED ORDER — LANTUS SOLOSTAR 100 UNIT/ML ~~LOC~~ SOPN: PEN_INJECTOR | SUBCUTANEOUS | 12 refills | Status: DC

## 2019-05-29 MED ORDER — NOVOLOG FLEXPEN 100 UNIT/ML ~~LOC~~ SOPN: 16.0000 [IU] | PEN_INJECTOR | Freq: Three times a day (TID) | SUBCUTANEOUS | 12 refills | Status: DC

## 2019-05-29 MED ORDER — METFORMIN HCL 1000 MG PO TABS: 1000.0000 mg | ORAL_TABLET | Freq: Two times a day (BID) | ORAL | 3 refills | Status: DC

## 2019-05-29 MED ORDER — AMLODIPINE BESYLATE 10 MG PO TABS: 10.0000 mg | ORAL_TABLET | Freq: Every day | ORAL | 3 refills | Status: DC

## 2019-05-29 MED FILL — NOVOLOG FLEXPEN SYRINGE: 100 | 25 days supply | Qty: 12 | Fill #0

## 2019-05-29 MED FILL — metFORMIN HCL 1000 MG TABS: 1000 | 30 days supply | Qty: 60 | Fill #0

## 2019-05-29 MED FILL — AMLODIPINE BESYLATE 10 MG T: 10 | 30 days supply | Qty: 30 | Fill #0

## 2019-05-29 MED FILL — LANTUS SOLOSTAR 100 UNITS/M: 100 | 25 days supply | Qty: 15 | Fill #0

## 2019-05-29 MED FILL — glipiZIDE 10 MG TABS: 10 | 30 days supply | Qty: 60 | Fill #0

## 2019-05-29 NOTE — Progress Notes (Signed)
   CC: Follow up T2DM and HTN   HPI:  [name] is a 49 y.o. year-old female with PMH listed below who presents to clinic for Follow up T2DM and HTN . Please see problem based assessment and plan for further details.   Past Medical History:  Diagnosis Date  . Depression   . Diabetes mellitus   . Fatty liver disease, nonalcoholic   . Hypertension   . Sinusitis    Review of Systems:   Review of Systems  Constitutional: Negative for chills, fever and weight loss.  Respiratory: Negative for shortness of breath.   Cardiovascular: Negative for chest pain.  Gastrointestinal: Negative for abdominal pain, nausea and vomiting.  Genitourinary: Positive for frequency and urgency. Negative for dysuria.  Neurological: Negative for dizziness and headaches.  Endo/Heme/Allergies: Positive for polydipsia.    Physical Exam:  Vitals:   05/29/19 1544  BP: 136/83  Pulse: 92  Temp: 98.5 F (36.9 C)  TempSrc: Oral  SpO2: 98%  Weight: 154 lb 4.8 oz (70 kg)    General: Well-appearing female in no acute distress Cardiac: regular rate and rhythm, nl S1/S2, no murmurs, rubs or gallops Pulm: CTAB, no wheezes or crackles, no increased work of breathing on room air  Ext: warm and well perfused, no peripheral edema   Assessment & Plan:   See Encounters Tab for problem based charting.  Patient discussed with Dr. Heide Spark

## 2019-05-29 NOTE — Patient Instructions (Addendum)
Robin Klein,   Le envie rellenados de todos sus medicamentos a la farmacia de [place].   Le cambiamos la dosis de su insulina Lantus. Ahora se va a injectar 30 unidades dos veces al dia. Todos los [employer] se los va a continuar tomando como usualmente lo hace.   Por favor, cheque su azucar por lo menos 3 veces al dia. haga una cita de seguimiento conmigo en 1 mes.   - Dr. Evelene Croon

## 2019-05-30 ENCOUNTER — Encounter: Payer: Self-pay | Admitting: Internal Medicine

## 2019-05-30 NOTE — Assessment & Plan Note (Signed)
Patient presents for follow-up of uncontrolled T2DM.  I saw her 3 months ago for this at which time she was in mild DKA after running out of all of her insulin.  She declined admission at the time.  She was given with insulin samples from the clinic and her medications were changed to [facility] so they could be more affordable for her.  She is now on Lantus 54 units at night, NovoLog 16 units with meals, maximum dose of Metformin, and glipizide 10 mg twice daily.  She was also on Victoza but this caused severe abdominal pain, N/V/D, and diarrhea and she stopped using it 2 weeks after starting it.  She is compliant with all her medications with the exception of NovoLog which she is only using BID instead of TID.  She brings her meter today but only checked her blood glucose 4 times.  Readings include 132, 352, 400, and 525.  She also reports urgency and frequency, denies GI symptoms today.  - Increase Lantus to 30 units twice daily - Continue NovoLog 16 units 3 times daily with meals, encourage compliance - Continue Metformin and glipizide - STOP Victoza due to GI side effects - Continue high intensity statin - Up-to-date with foot exam and urine microalbumin - Declined eye exam today - Follow up in 3 weeks with me, asked her to check BG at least 3x/day and bring meter to next visit

## 2019-05-30 NOTE — Assessment & Plan Note (Signed)
Continue atorvastatin 40 mg QD.

## 2019-05-30 NOTE — Assessment & Plan Note (Signed)
Well controlled on amlodipine 10 mg QD. Will continue.

## 2019-06-01 NOTE — Progress Notes (Signed)
Internal Medicine Clinic Attending  Case discussed with Dr. Santos-Sanchez at the time of the visit.  We reviewed the resident's history and exam and pertinent patient test results.  I agree with the assessment, diagnosis, and plan of care documented in the resident's note.    

## 2019-06-26 ENCOUNTER — Other Ambulatory Visit: Payer: Self-pay | Admitting: Internal Medicine

## 2019-06-26 ENCOUNTER — Other Ambulatory Visit: Payer: Self-pay

## 2019-06-26 ENCOUNTER — Ambulatory Visit: Payer: Self-pay | Admitting: Internal Medicine

## 2019-06-26 ENCOUNTER — Encounter: Payer: Self-pay | Admitting: Internal Medicine

## 2019-06-26 VITALS — BP 132/75 | HR 79 | Temp 98.1°F | Wt 157.7 lb

## 2019-06-26 DIAGNOSIS — Z794 Long term (current) use of insulin: Secondary | ICD-10-CM

## 2019-06-26 DIAGNOSIS — E119 Type 2 diabetes mellitus without complications: Secondary | ICD-10-CM

## 2019-06-26 DIAGNOSIS — E1165 Type 2 diabetes mellitus with hyperglycemia: Secondary | ICD-10-CM

## 2019-06-26 MED ORDER — PEN NEEDLES 31G X 5 MM MISC: 1.0000 | Freq: Three times a day (TID) | 11 refills | Status: DC

## 2019-06-26 MED ORDER — LANTUS SOLOSTAR 100 UNIT/ML ~~LOC~~ SOPN: PEN_INJECTOR | SUBCUTANEOUS | 12 refills | Status: DC

## 2019-06-26 MED FILL — LANTUS SOLOSTAR 100 UNITS/M: 100 | 26 days supply | Qty: 18 | Fill #0

## 2019-06-26 MED FILL — UNIFINE PENTIPS 31GX3/16: 31G X 5 MM | 25 days supply | Qty: 100 | Fill #0

## 2019-06-26 NOTE — Patient Instructions (Signed)
aumente la dosis de Lantus a 35 unidades dos veces al dia. Continue usando Novolog 16 unidades 3 veces al dia con las comidas. Y continue usando metformina y glipizide.   Haga una cita de seguimiento con su doctora nueva en 2 meses.   - Dra. Evelene Croon

## 2019-06-27 ENCOUNTER — Encounter: Payer: Self-pay | Admitting: Internal Medicine

## 2019-06-27 MED ORDER — PEN NEEDLES 31G X 5 MM MISC: 1.0000 | Freq: Three times a day (TID) | 11 refills | Status: DC

## 2019-06-27 NOTE — Assessment & Plan Note (Signed)
Patient presents for follow-up of T2DM.  She was seen 1 month ago at which time her diabetes was uncontrolled and she was hyperglycemic.  We adjusted her insulin regimen and she is now on Lantus 30 units twice daily, NovoLog 16 units 3 times daily with meals, maximum dose of metformin, and glipizide.  She reports improved compliance with her NovoLog.  Brings her meter today with 15 readings mostly 250s-350s with one reading in the 500s.  This is significantly improved from previous visits when she was reporting her sugar was so high the meter could not read it.  She also reports feeling better and denies GI symptoms and urinary symptoms. A1c 13.7 -> 12.6 one month ago.   - Increase Lantus to 35 units twice daily - Continue NovoLog 16 units 3 times daily with meals, Metformin, and glipizide, - Follow-up in 2 months, will need A1c and eye exam then

## 2019-06-27 NOTE — Progress Notes (Signed)
Internal Medicine Clinic Attending  Case discussed with Dr. Santos-Sanchez at the time of the visit.  We reviewed the resident's history and exam and pertinent patient test results.  I agree with the assessment, diagnosis, and plan of care documented in the resident's note.  Glenn Gullickson, M.D., Ph.D.  

## 2019-06-27 NOTE — Progress Notes (Signed)
   CC: T2DM follow-up  HPI:  [name] is a 49 y.o. year-old female with PMH listed below who presents to clinic for T2DM follow-up. Please see problem based assessment and plan for further details.   Past Medical History:  Diagnosis Date  . Depression   . Diabetes mellitus   . Fatty liver disease, nonalcoholic   . Hypertension   . Sinusitis    Review of Systems:   Review of Systems  Constitutional: Negative for chills, fever, malaise/fatigue and weight loss.  Gastrointestinal: Negative for abdominal pain, nausea and vomiting.  Genitourinary: Negative for frequency and urgency.  Endo/Heme/Allergies: Negative for polydipsia.    Physical Exam:  Vitals:   06/26/19 1358  BP: 132/75  Pulse: 79  Temp: 98.1 F (36.7 C)  TempSrc: Oral  Weight: 157 lb 11.2 oz (71.5 kg)    General: Well-appearing female in no acute distress Mouth: moist MM  Cardiac: regular rate and rhythm, nl S1/S2, no murmurs, rubs or gallops Ext: warm and well perfused, no peripheral edema   Assessment & Plan:   See Encounters Tab for problem based charting.  Patient discussed with Dr. Sandre Kitty

## 2019-07-19 ENCOUNTER — Other Ambulatory Visit: Payer: Self-pay

## 2019-07-19 MED FILL — UNIFINE PENTIPS 31GX3/16: 31G X 5 MM | 25 days supply | Qty: 100 | Fill #1

## 2019-07-19 MED FILL — AMLODIPINE BESYLATE 10 MG T: 10 | 30 days supply | Qty: 30 | Fill #1

## 2019-07-19 NOTE — Telephone Encounter (Signed)
Called pharmacy, pt has plenty of refills on both, they will get them ready and call her

## 2019-07-19 NOTE — Telephone Encounter (Signed)
amLODipine (NORVASC) 10 MG tablet   Insulin Pen Needle (PEN NEEDLES) 31G X 5 MM MISC, REFILL REQUEST @  [facility] - [place], [place] - [address] [facility]. Phone:  [phone]  Fax:  [phone]

## 2019-08-04 ENCOUNTER — Ambulatory Visit: Payer: Self-pay | Admitting: Student

## 2019-08-04 ENCOUNTER — Encounter: Payer: Self-pay | Admitting: Student

## 2019-08-04 ENCOUNTER — Other Ambulatory Visit: Payer: Self-pay | Admitting: Student

## 2019-08-04 VITALS — BP 138/74 | HR 92 | Temp 98.3°F | Ht 62.0 in | Wt 162.7 lb

## 2019-08-04 DIAGNOSIS — Z794 Long term (current) use of insulin: Secondary | ICD-10-CM

## 2019-08-04 DIAGNOSIS — E785 Hyperlipidemia, unspecified: Secondary | ICD-10-CM

## 2019-08-04 DIAGNOSIS — I10 Essential (primary) hypertension: Secondary | ICD-10-CM

## 2019-08-04 DIAGNOSIS — R4589 Other symptoms and signs involving emotional state: Secondary | ICD-10-CM

## 2019-08-04 DIAGNOSIS — E1165 Type 2 diabetes mellitus with hyperglycemia: Secondary | ICD-10-CM

## 2019-08-04 MED ORDER — CONTOUR NEXT TEST VI STRP: ORAL_STRIP | 12 refills | Status: DC

## 2019-08-04 NOTE — Assessment & Plan Note (Signed)
Will recheck lipid panel today and continue on atorvastatin 40mg  daily.
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
## 2019-08-04 NOTE — Assessment & Plan Note (Signed)
A: Blood pressure slightly elevated today on amlodipine 10mg  only; patient has allergies to lisinopril, losartan and HCTZ. BP Readings from Last 3 Encounters:  08/04/19 (!) 138/74  06/26/19 132/75  05/29/19 136/83     P: Will consider adding carvedilol at future visit after patient has better insulin compliance.
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
## 2019-08-04 NOTE — Patient Instructions (Signed)
Hoy, discutimos que mantendremos sus [employer]el. Se le ha dado Scientist, product/process development Next y ordenar tiras reactivas a la farmacia para pacientes ambulatorios.  Por favor revise sus azcares regularmente a primera hora de la maana y antes de cada comida.  Por favor anote sus azcares o traiga su siguiente medidor de contorno a su prxima cita.  Vuelva a Presenter, broadcasting en 1 mes.  Gracias,  Dra. Glenford Bayley  ------------------------  Today, we discussed that we will keep your medications the same. You have been given a contour Next meter and I will order test strips to the Outpatient Pharmacy.  Please check your sugars regularly first thing in the morning and before each meal.   Please write your sugars down or bring your contour next meter with you to your next appointment.  Please follow up again in 1 month.  Thank you,  Dr. Glenford Bayley    Control de la glucemia, en adultos Blood Glucose Monitoring, Adult Controlar el nivel de azcar en la sangre (glucosa) es una parte importante del tratamiento de su diabetes (diabetes mellitus). El control de la glucemia implica realizar controles regulares segn le indiquen, y Midwife un registro de los resultados (registro diario) a lo largo del [place]. Controlar la glucemia en forma peridica y llevar un registro diario puede:  Ayudarlos a usted y al mdico a Theme park manager de control de la diabetes segn sea necesario, lo que incluye medicamentos o insulina.  Ayudarlo a usted a comprender de [employer], la actividad fsica, las enfermedades y los medicamentos inciden en la glucemia.  Permitirle a usted saber cul es su nivel de glucemia en cualquier momento. Averiguar rpidamente si su nivel de glucemia es bajo (hipoglucemia) o alto (hiperglucemia). El [employer]aya objetivos personalizados de su tratamiento. Los objetivos estarn basados en su edad, en otras enfermedades que tenga y en cmo  responde al tratamiento de la diabetes. Generalmente, el objetivo del tratamiento es [employer] siguientes niveles de glucemia:  Antes de las comidas (preprandial): de 80 a 130mg /dl (4,4 a ).  Despus de las comidas (posprandial): por debajo de 180mg /dl (7,8MVEH/M).  Nivel de A1c: menos del 7%. Materiales necesarios:  Medidor de glucemia.  Tiras reactivas para el medidor. Cada medidor tiene sus propias tiras reactivas. Debe usar las tiras reactivas que trajo su medidor.  Una aguja para pincharse el dedo (lanceta). No use una lanceta ms de una vez.  Un dispositivo que sujeta la lanceta (dispositivo de puncin).  Un diario o cuaderno de anotaciones para . Cmo controlar su glucemia  1. Lvese las manos con agua y 09OBSJ/G. 2. Pnchese el costado del dedo (no en la punta) con la lanceta. Use un dedo diferente cada vez. 3. Frote suavemente el dedo hasta que aparezca una pequea gota de [place]. 4. Siga las instrucciones que vienen con el medidor para [place] tira [place], Public affairs consultant la sangre sobre la tira y usar el medidor de glucemia. 5. Registre su resultado y las observaciones que desee. Algunos medidores le permiten tomar sangre para la prueba de otras zonas del cuerpo que no son el dedo (zonas alternativas). Las zonas alternativas ms comunes son las siguientes:  Los Firefighter.  Los muslos.  La palma de la mano. Si cree que puede tener hipoglucemia, o si tiene antecedentes de no saber cundo le est bajando el nivel de glucemia (hipoglucemia asintomtica), no use zonas alternativas. En su lugar, use los dedos. Es posible que las zonas  alternativas no sean tan precisas como los dedos porque el flujo de sangre es ms lento en esas zonas. Esto significa que el resultado que obtiene de estas zonas puede estar retrasado y ser un poco diferente del resultado que obtendra de su dedo. Siga estas indicaciones en su casa: Registro diario de  glucemia   Cada vez que controle su nivel de glucemia, anote el resultado. Tambin anote aquellos factores que puedan estar afectando su nivel de glucemia, como la dieta y la actividad fsica Dietitian. Esta informacin puede ayudarlos a usted y a su mdico a: ? Architect patrones en su glucemia durante el transcurso del [place]. ? Ajustar su plan de control de la diabetes segn sea necesario.  Averige si su Energy manager sus registros en una computadora. La [employer]son de los medidores de glucosa guardan un registro de las lecturas realizadas con el medidor. Si usted tiene diabetes tipo 1:  Controle su nivel de glucemia 2 o ms veces al da.  Tambin controle su nivel de glucemia: ? Antes de cada inyeccin de insulina. ? Antes y despus de hacer ejercicio. ? Antes de comer. ? 2 horas despus de una comida. ? Ocasionalmente, entre las 2:00a.m. y las 3:00a.m., como se lo hayan indicado. ? Antes de Education officer, environmental tareas peligrosas, Sport and exercise psychologist o usar maquinaria pesada. ? A la hora de acostarse.  Es posible que Chemical engineer con ms frecuencia sus niveles de glucemia, hasta 6 a 10veces por da, si: ? Usted [place] una bomba de insulina. ? Usted necesita mltiples inyecciones diarias (MDI, por sus siglas en ingls). ? Tiene diabetes que no est bien controlada. ? Usted est enfermo. ? Usted tiene antecedentes de hipoglucemia grave. ? Usted tiene hipoglucemia asintomtica. Si usted tiene diabetes tipo 2:  Si recibe insulina u otros medicamentos para la diabetes, controle su nivel de glucemia 2 o ms veces al Futures trader.  Si recibe tratamiento intensivo con insulina, debe medirse el nivel de glucemia 4o ms veces al [employer]. Ocasionalmente, es posible que deba controlarlo entre las 2:00a.m. y las 3:00a.m., segn se lo indiquen.  Tambin controle su nivel de glucemia: ? Antes y despus de hacer ejercicio. ? Antes de Education officer, environmental tareas peligrosas, Sport and exercise psychologist o usar maquinaria  pesada.  Es posible que deba controlar con ms frecuencia su nivel de glucemia si: ? Necesita ajustar la dosis de sus medicamentos. ? Su diabetes no est bien controlada. ? Est enfermo. Consejos generales  Siempre tenga sus insumos a mano.  Todos los medidores de glucemia incluyen un nmero de telfono "directo", disponible las 24 horas, al que podr llamar si tiene preguntas o [place]. Tambin puede consultar a su mdico.  Despus de usar algunas cajas de tiras reactivas, ajuste (calibre) el medidor de glucemia segn las instrucciones del medidor. Comunquese con un mdico si:  El nivel de glucemia es mayor o igual que 240mg /dl ( ) durante 2das seguidos.  Ha estado enfermo o ha tenido fiebre durante ms de 2das y no mejora.  Tiene alguno de los siguientes problemas durante ms de 6horas: ? No puede comer ni beber. ? Tiene nuseas o vmitos. ? Tiene diarrea. Solicite ayuda de inmediato si:  Su nivel de glucemia est por debajo de 54mg /dl (57mmol/l).  Se siente confundido o tiene dificultad para pensar con claridad.  Tiene dificultad para respirar.  Tiene un nivel moderado o alto de cetonas en la [place]. Resumen  1m de azcar en la sangre (glucosa) es una parte importante del  tratamiento de su diabetes (diabetes mellitus).  El control de la glucemia implica realizar controles regulares segn le indiquen, y Midwife un registro de los resultados (registro diario) a lo largo del [place].  El Avaya objetivos personalizados de su tratamiento. Los objetivos estarn basados en su edad, en otras enfermedades que tenga y en cmo responde al tratamiento de la diabetes.  Cada vez que controle su nivel de glucemia, anote el resultado. Tambin anote aquellos factores que puedan estar afectando su nivel de glucemia, como la dieta y la actividad fsica Dietitian. Esta informacin no tiene Theme park manager el consejo del  mdico. Asegrese de hacerle al mdico cualquier pregunta que tenga. Document Revised: 04/02/2017 Document Reviewed: 06/03/2015 Elsevier Patient Education  2020 [employer].

## 2019-08-05 LAB — BMP8+ANION GAP
Anion Gap: 16 mmol/L (ref 10.0–18.0)
BUN/Creatinine Ratio: 18 (ref 9–23)
BUN: 9 mg/dL (ref 6–24)
CO2: 23 mmol/L (ref 20–29)
Calcium: 9.2 mg/dL (ref 8.7–10.2)
Chloride: 98 mmol/L (ref 96–106)
Creatinine, Ser: 0.51 mg/dL — ABNORMAL LOW (ref 0.57–1.00)
GFR calc Af Amer: 132 mL/min/{1.73_m2} (ref >59)
GFR calc non Af Amer: 115 mL/min/{1.73_m2} (ref >59)
Glucose: 304 mg/dL — ABNORMAL HIGH (ref 65–99)
Potassium: 4.3 mmol/L (ref 3.5–5.2)
Sodium: 137 mmol/L (ref 134–144)

## 2019-08-05 LAB — LIPID PANEL
Chol/HDL Ratio: 3.9 ratio (ref 0.0–4.4)
Cholesterol, Total: 177 mg/dL (ref 100–199)
HDL: 45 mg/dL (ref >39)
LDL Chol Calc (NIH): 102 mg/dL — ABNORMAL HIGH (ref 0–99)
Triglycerides: 172 mg/dL — ABNORMAL HIGH (ref 0–149)
VLDL Cholesterol Cal: 30 mg/dL (ref 5–40)

## 2019-08-06 NOTE — Progress Notes (Signed)
Internal Medicine Clinic Attending  I saw and evaluated the patient.  I personally confirmed the key portions of the history and exam documented by Dr. Laddie Aquas and I reviewed pertinent patient test results.  The assessment, diagnosis, and plan were formulated together and I agree with the documentation in the resident's note. Dr. Laddie Aquas was able to identify barriers to adherence, namely significant stressors in the family for which counseling is indeed necessary if we are to succeed in chronic disease management.  A simplified regimen, perhaps omitting the short acting insulin, may help her feel more empowered to stay on top of her diabetes.

## 2019-08-07 MED FILL — CONTOUR NEXT TEST STRP: 25 days supply | Qty: 100 | Fill #0

## 2019-08-11 ENCOUNTER — Encounter: Payer: Self-pay | Admitting: Internal Medicine

## 2019-08-30 ENCOUNTER — Other Ambulatory Visit: Payer: Self-pay | Admitting: Student

## 2019-08-30 DIAGNOSIS — E1165 Type 2 diabetes mellitus with hyperglycemia: Secondary | ICD-10-CM

## 2019-08-30 MED ORDER — "INSULIN SYRINGE-NEEDLE U-100 31G X 5/16"" 0.5 ML MISC": 3 refills | Status: DC

## 2019-08-30 MED ORDER — LANCETS MISC: 3 refills | Status: DC

## 2019-08-30 MED FILL — UNIFINE PENTIPS 31GX3/16: 31G X 5 MM | 25 days supply | Qty: 100 | Fill #2

## 2019-08-30 MED FILL — MICROLET LANCETS MISC: 30 days supply | Qty: 100 | Fill #0

## 2019-08-30 MED FILL — LANTUS SOLOSTAR 100 UNITS/M: 100 | 26 days supply | Qty: 18 | Fill #1

## 2019-08-30 MED FILL — NOVOLOG FLEXPEN SYRINGE: 100 | 25 days supply | Qty: 12 | Fill #1

## 2019-08-30 NOTE — Telephone Encounter (Signed)
Need refill on insulin aspart (NOVOLOG FLEXPEN) 100 UNIT/ML FlexPen insulin glargine (LANTUS SOLOSTAR) 100 UNIT/ML Solostar Pen Insulin Pen Needle (PEN NEEDLES) 31G X 5 MM MISC Insulin Syringe-Needle U-100 (GNP INSULIN SYRINGE) 31G X 5/16" 0.5 ML MISC Lancets MISC ;pt contact [phone]    [facility] - [place], [place] - [address] [facility].

## 2019-08-30 NOTE — Telephone Encounter (Signed)
Confirmed with Kirsten at [facility] they have refills on novolog, lantus, and pen needles. She will remind patient to call them first for refills. Will send refill request on syringes and lancets. Kinnie Feil, BSN, RN-BC

## 2019-09-04 ENCOUNTER — Ambulatory Visit (INDEPENDENT_AMBULATORY_CARE_PROVIDER_SITE_OTHER): Payer: Self-pay | Admitting: Internal Medicine

## 2019-09-04 ENCOUNTER — Other Ambulatory Visit: Payer: Self-pay

## 2019-09-04 ENCOUNTER — Encounter: Payer: Self-pay | Admitting: Internal Medicine

## 2019-09-04 VITALS — BP 124/79 | HR 80 | Temp 98.4°F | Wt 157.5 lb

## 2019-09-04 DIAGNOSIS — Z794 Long term (current) use of insulin: Secondary | ICD-10-CM

## 2019-09-04 DIAGNOSIS — Z7984 Long term (current) use of oral hypoglycemic drugs: Secondary | ICD-10-CM

## 2019-09-04 DIAGNOSIS — E1165 Type 2 diabetes mellitus with hyperglycemia: Secondary | ICD-10-CM

## 2019-09-04 DIAGNOSIS — H9202 Otalgia, left ear: Secondary | ICD-10-CM | POA: Insufficient documentation

## 2019-09-04 LAB — POCT GLYCOSYLATED HEMOGLOBIN (HGB A1C): Hemoglobin A1C: 11.8 % — AB (ref 4.0–5.6)

## 2019-09-04 LAB — GLUCOSE, CAPILLARY: Glucose-Capillary: 429 mg/dL — ABNORMAL HIGH (ref 70–99)

## 2019-09-04 NOTE — Assessment & Plan Note (Signed)
[name] notes a history of left ear pain that began several weeks ago when she had a sinus infection.  The pain has continued and is particularly aggravated by humidity or when he gets wet.  She would would like to have it looked at again.  She denies any difficulty with hearing.  Assessment/plan: Examination is grossly normal; no bulging, erythema, fluid levels.  Recommended over-the-counter drops for swimmer's ear if she continues to have pain in her ears get wet.

## 2019-09-04 NOTE — Patient Instructions (Addendum)
It was nice seeing you today! Thank you for choosing [facility] for your Primary Care.    Today we talked about:   1. Diabetes: Today, we will stop the Glipizide. Continue taking Lantus, Novolog and Metformin.  a. Lantus 35 units twice a day b. Novolog 16 units three times per day with meals (Do not take if you did not eat anything) c. Metformin twice a day  Continue working on setting up a routine that helps you remember to check your sugars and take your insulin. This will be the most important part of controlling your diabetes.   Follow up in 4 weeks to see how your routine is going. This can be done over the phone or in person  ----------------------------------------------------------------------------------- Fue un placer verte hoy! Gracias por elegir [facility] para su atencin primaria.   Hoy hablamos de:  1. Diabetes: hoy dejaremos de tomar glipizida. Contine tomando Lantus, Novolog y Metformin.  Lantus 35 unidades dos veces al da  Novolog 16 unidades tres veces al da con las comidas (no tomar si no comi nada)  Metformina dos veces al da  Contine trabajando para establecer una rutina que le ayude a Contractor sus azcares y Careers adviser su insulina. Esta ser la parte ms importante para controlar su diabetes.  Haga un seguimiento en 4 semanas para ver cmo va su rutina. Esto se puede hacer por telfono o en persona.

## 2019-09-04 NOTE — Progress Notes (Signed)
1/18

## 2019-09-04 NOTE — Assessment & Plan Note (Signed)
Lab Results  Component Value Date   HGBA1C 11.8 (A) 09/04/2019   Current regimen: Lantus 35 units twice daily, NovoLog 16 units 3 times daily with meals, glipizide, Metformin  [name] states she has been working on lowering her daily carb intake, decreasing portion size of bread products and implementing more salads.  She is continues to struggle with taking her blood sugar regularly and notes that she misses at least 2 days of insulin per week with regularly missed doses throughout the days that she does take insulin on.  She states that often times she will already begin eating or she will have left her home without her insulin with her.  She is excited to be noticing some improvement (BGs in the 200-300 instead of 400-500s) already though and is eager to continue to work on improving her routine.  She denies any hypoglycemic moments.  She brings her glucometer with her today that shows 7 readings for the past month.  2 readings are from the morning, 3 readings in the midday, 2 readings in the evening.  Average 306.  Maximum of 385, minimum of 237.  No evidence of hypoglycemic events.  Assessment/plan: A1c has slightly improved today, however diabetes continues to be very uncontrolled placing her at high risk of complications.  Patient is aware of this and is actively working on improving.  I encouraged that she continue to focus on insulin compliance and blood sugar checks over the next month.  No insulin changes today, however glipizide is unlikely to be providing any glycemic improvement with the levels of insulin she is on at this time.  Will discontinue, as the risk is higher than the benefit.  -Continue Lantus, NovoLog, Metformin -Discontinue glipizide -A1c in 3 months -4-week follow-up, via telehealth or in person to assess how she is doing with compliance

## 2019-09-04 NOTE — Progress Notes (Signed)
   CC: Diabetes follow up  HPI:  [name] is a 49 y.o. with a PMHx as listed below who presents to the clinic for diabetes follow up.   Please see the Encounters tab for problem-based Assessment & Plan regarding status of patient's acute and chronic conditions.  Past Medical History:  Diagnosis Date  . Depression   . Diabetes mellitus   . Fatty liver disease, nonalcoholic   . Hypertension   . Sinusitis    Review of Systems: Review of Systems  Constitutional: Positive for weight loss (intentional). Negative for chills, fever and malaise/fatigue.  Gastrointestinal: Negative for abdominal pain, nausea and vomiting.  Neurological: Negative for dizziness and headaches.   Physical Exam:  Vitals:   09/04/19 1346  BP: 124/79  Pulse: 80  Temp: 98.4 F (36.9 C)  TempSrc: Oral  SpO2: 100%  Weight: 157 lb 8 oz (71.4 kg)   Physical Exam Vitals and nursing note reviewed.  Constitutional:      General: She is not in acute distress. Pulmonary:     Effort: Pulmonary effort is normal. No respiratory distress.  Skin:    General: Skin is warm and dry.  Neurological:     General: No focal deficit present.     Mental Status: She is alert and oriented to person, place, and time. Mental status is at baseline.     Gait: Gait normal.  Psychiatric:        Mood and Affect: Mood normal.        Behavior: Behavior normal.    Assessment & Plan:   See Encounters Tab for problem based charting.  Patient discussed with Dr. Oswaldo Done

## 2019-09-06 NOTE — Progress Notes (Signed)
Internal Medicine Clinic Attending  Case discussed with Dr. Basaraba  At the time of the visit.  We reviewed the resident's history and exam and pertinent patient test results.  I agree with the assessment, diagnosis, and plan of care documented in the resident's note.  

## 2019-09-19 ENCOUNTER — Encounter: Payer: Self-pay | Admitting: Licensed Clinical Social Worker

## 2019-09-25 ENCOUNTER — Other Ambulatory Visit: Payer: Self-pay | Admitting: Student

## 2019-09-25 MED FILL — AMLODIPINE BESYLATE 10 MG T: 10 | 90 days supply | Qty: 90 | Fill #2

## 2019-09-25 NOTE — Telephone Encounter (Signed)
Need refill on amLODipine (NORVASC) 10 MG tablet  ;pt contact [phone]   [facility] - [place], [place] - [address] [facility].

## 2019-09-25 NOTE — Telephone Encounter (Signed)
#  90 with 3 refills sent 05/29/2019. Spoke with Fritzi Mandes at [facility]. She will get refill ready for patient and remind her to call them for refill before calling us. Kinnie Feil, BSN, RN-BC

## 2019-10-05 ENCOUNTER — Ambulatory Visit (INDEPENDENT_AMBULATORY_CARE_PROVIDER_SITE_OTHER): Payer: Self-pay | Admitting: Student

## 2019-10-05 ENCOUNTER — Encounter: Payer: Self-pay | Admitting: Student

## 2019-10-05 ENCOUNTER — Other Ambulatory Visit: Payer: Self-pay

## 2019-10-05 VITALS — BP 130/90 | HR 83 | Temp 98.7°F | Ht 62.0 in | Wt 159.7 lb

## 2019-10-05 DIAGNOSIS — E1165 Type 2 diabetes mellitus with hyperglycemia: Secondary | ICD-10-CM

## 2019-10-05 DIAGNOSIS — I10 Essential (primary) hypertension: Secondary | ICD-10-CM

## 2019-10-05 DIAGNOSIS — Z0000 Encounter for general adult medical examination without abnormal findings: Secondary | ICD-10-CM

## 2019-10-05 DIAGNOSIS — Z0001 Encounter for general adult medical examination with abnormal findings: Secondary | ICD-10-CM

## 2019-10-05 MED ORDER — LANTUS SOLOSTAR 100 UNIT/ML ~~LOC~~ SOPN: PEN_INJECTOR | SUBCUTANEOUS | Status: DC

## 2019-10-05 NOTE — Assessment & Plan Note (Signed)
Patient declined flu shot and Covid vaccines. She stated that her last flu shot made her feel sick. She is also hesitant to receive the Vaccine because they were not approved in the past. Her family is also not vaccinated. We will continue this conversation next visit.

## 2019-10-05 NOTE — Progress Notes (Signed)
   CC: Diabetic management  HPI:  Ms.Jesika S Tresa Res is a 49 y.o. type 2 diabetes, hypertension, who presents to the clinic for follow-up diabetes management. The visit was assisted by a Spanish-speaking interpreter.  Please see problem based charting for further detail.  Past Medical History:  Diagnosis Date  . Depression   . Diabetes mellitus   . Fatty liver disease, nonalcoholic   . Hypertension   . Sinusitis    Review of Systems: As per HPI  Physical Exam:  Vitals:   10/05/19 1341 10/05/19 1500  BP: 140/88 130/90  Pulse: 83   Temp: 98.7 F (37.1 C)   TempSrc: Oral   SpO2: 99%   Weight: 159 lb 11.2 oz (72.4 kg)   Height: 5\' 2"  (1.575 m)    Physical Exam Constitutional:      General: She is not in acute distress. HENT:     Head: Normocephalic.  Eyes:     General:        Right eye: No discharge.        Left eye: No discharge.  Cardiovascular:     Rate and Rhythm: Normal rate and regular rhythm.     Heart sounds: No murmur heard.   Pulmonary:     Effort: Pulmonary effort is normal.     Breath sounds: Normal breath sounds.  Abdominal:     General: Bowel sounds are normal.  Musculoskeletal:     Cervical back: Normal range of motion.     Right lower leg: No edema.     Left lower leg: No edema.     Comments: +2 pulses bilaterally lower extremities No wound or ulcer noted  Skin:    General: Skin is warm.     Coloration: Skin is not jaundiced.  Neurological:     Mental Status: She is alert.  Psychiatric:        Mood and Affect: Mood normal.     Assessment & Plan:   See Encounters Tab for problem based charting.  Patient seen with Dr. 

## 2019-10-05 NOTE — Patient Instructions (Addendum)
Robin Klein,  It is a pleasure getting to know you today.  Here is a summary of what we talked about  1.  Diabetes: Your A1c last time were 11.8.  Our goal A1c is around 7.  Please continue to work on your diet, cut back on the tortillas and sweets and sodas.  Please start giving yourself 40 units of Lantus at night and 35 units in the morning.  Continue 16 units of NovoLog 3 times a day with meals.  Please check your blood sugar 4 times a day in the morning fasting and 3 times before meals.  We will see you back in 1 week.  Please bring your glucometer.  2.  I will also reconnect you with Lupita Leash.  She is a Chief Technology Officer for diabetic management.  Call us for any questions or concerns  Take care  Dr. Cyndie Chime

## 2019-10-05 NOTE — Assessment & Plan Note (Addendum)
Initial blood pressure 140/88, repeat blood pressure 130/90. We will readjust her blood pressure medication based on the urine microalbumin/creatinine ratio. Patient is allergic to lisinopril and losartan.  Plan: -Continue amlodipine 10 mg

## 2019-10-05 NOTE — Assessment & Plan Note (Addendum)
Patient was seen 1 month prior for diabetes management. Her A1c was 11.8 at that time. She was advised to be more compliant with administering insulin and come back to the clinic in 1 month.  Today, patient states that she has been more compliant and only missed 1 or 2 doses of NovoLog per week. Patient stated that she eats about 3 tortillas per meal every day. She also eats sweets occasionally and drinks about 4 bottles of soda per week. Patient states that she checks her blood sugar mainly in the morning. Her glucometer report still shows high readings in the 300s and 400s in the morning. This is likely due to diet consists of high carbohydrates. I advised her to cut back on the amount of tortillas per meal and also cut back on the sodas and drink water instead. She agrees with the plan. We will also increase her nighttime Lantus to 40 units. Since patient does not check her blood sugar before meals, it is difficult to adjust her mealtime NovoLog. She is advised to check her blood sugar 4 times a day, in the morning and before meals, and come back in 1 week. We will readjust her mealtime insulin based on the glucometer readings. Also reconnect her with Lupita Leash to assist with diabetes management.  Plan: -Lantus 35 units in the morning a 40 units at night -NovoLog 16 units 3 times a day -Metformin 1 g twice daily -CBG 4 times a day -Come back in 1 week -Pending urine microalbumin/creatinine ratio

## 2019-10-06 LAB — MICROALBUMIN / CREATININE URINE RATIO
Creatinine, Urine: 35.4 mg/dL
Microalb/Creat Ratio: 21 mg/g creat (ref 0–29)
Microalbumin, Urine: 7.6 ug/mL

## 2019-10-06 NOTE — Progress Notes (Signed)
Internal Medicine Clinic Attending  I saw and evaluated the patient.  I personally confirmed the key portions of the history and exam documented by Dr. Nguyen and I reviewed pertinent patient test results.  The assessment, diagnosis, and plan were formulated together and I agree with the documentation in the resident's note.\  

## 2019-10-10 MED FILL — LANTUS SOLOSTAR 100 UNITS/M: 100 | 26 days supply | Qty: 18 | Fill #2

## 2019-10-12 ENCOUNTER — Other Ambulatory Visit: Payer: Self-pay

## 2019-10-12 ENCOUNTER — Ambulatory Visit: Payer: Self-pay | Admitting: Student

## 2019-10-12 ENCOUNTER — Encounter: Payer: Self-pay | Admitting: Student

## 2019-10-12 VITALS — BP 128/78 | HR 92 | Temp 98.4°F | Ht 62.0 in | Wt 162.1 lb

## 2019-10-12 DIAGNOSIS — E782 Mixed hyperlipidemia: Secondary | ICD-10-CM

## 2019-10-12 DIAGNOSIS — Z0000 Encounter for general adult medical examination without abnormal findings: Secondary | ICD-10-CM

## 2019-10-12 DIAGNOSIS — E785 Hyperlipidemia, unspecified: Secondary | ICD-10-CM

## 2019-10-12 DIAGNOSIS — Z0001 Encounter for general adult medical examination with abnormal findings: Secondary | ICD-10-CM

## 2019-10-12 DIAGNOSIS — I10 Essential (primary) hypertension: Secondary | ICD-10-CM

## 2019-10-12 DIAGNOSIS — E1165 Type 2 diabetes mellitus with hyperglycemia: Secondary | ICD-10-CM

## 2019-10-12 MED ORDER — LANTUS SOLOSTAR 100 UNIT/ML ~~LOC~~ SOPN: PEN_INJECTOR | SUBCUTANEOUS | Status: DC

## 2019-10-12 NOTE — Assessment & Plan Note (Addendum)
Patient historically has declined flu shots and COVID-19 shots and is in need of Pap smear and ophthalmalgic examination, although currently is self pay. - Please reassess at follow up visit.

## 2019-10-12 NOTE — Assessment & Plan Note (Signed)
Blood pressure today under better control on same dose of amlodipine.  BP Readings from Last 3 Encounters:  10/12/19 128/78  10/05/19 130/90  09/04/19 124/79  - Will continue to monitor on amlodipine 10mg  daily

## 2019-10-12 NOTE — Assessment & Plan Note (Signed)
On 08/04/19, patient had elevated LDL of 102 and triglycerides of 172. She was started on Atorvastatin. - Continue Atorvastatin 40mg  daily and recheck lipid profile January 2022

## 2019-10-12 NOTE — Patient Instructions (Addendum)
Robin Klein,  Hoy, discutimos que sus niveles de azcar en sangre continan elevados. Buen trabajo para comer ms saludablemente y Chief Operating Officer sus azcares con ms frecuencia! Su cuerpo necesitar tiempo para adaptarse a los [place], aunque lo est [employer]. Contine controlando sus azcares [employer] por la maana antes del desayuno y 2 horas despus de cada comida.  He aumentado su Lantus de 35 unidades por la maana a 40 unidades por la [employer]. Contine tomando 40 unidades por la noche adems de su glipizida, novolog 16 unidades tres veces al da 15 minutos antes de cada comida y metformina 1000 dos veces al da.  No dude en llamarnos si tiene preguntas y nos veremos para una visita de regreso el prximo mes.  Gracias,  Dra. Glenford Bayley  ---------------------------------------------  [name],  Today, we discussed that your blood sugars continue to be elevated. Great job on eating healthier and checking your sugars more often!  It will take time for you body to adjust to the changes, although you are doing well. Continue to check your sugars every day in the morning before breakfast, and 2 hours after every meal.  I have increased your Lantus from 35 units in the morning to 40 units in the morning. Please continue taking 40 units at night in addition to your glipizide, novolog 16 units three times per day 15 minutes prior to each meal, and metformin 1000 twice daily.  Please don't hesitate to call with questions, and we will see you for a return visit next month.  Thank you,  Dr. Glenford Bayley   O diabetes mellitus e a nutrio, adultos Diabetes Mellitus and Nutrition, Adult Quando voc sofre de diabetes (diabetes mellitus),  muito importante ter hbitos de alimentao saudveis, uma vez que os seus nveis de acar no sangue (glicose) so significativamente afetados pelo que voc come e bebe. Comer alimentos saudveis nas quantidades apropriadas  e aproximadamente nos mesmos horrios todos os dias pode ajudar voc a:  Controlar seu nvel de glicose sangunea.  Reduzir seu risco de doena cardaca.  Melhorar sua presso arterial.  Alcanar ou manter um peso saudvel. Todas as pessoas com diabetes so diferentes, e cada pessoa tem diferentes necessidades em termos de um plano de refeies. Seu mdico poder recomendar que voc colabore com um especialista em nutrio e dieta (nutricionista) para elaborar o melhor plano de refeies para voc. Seu plano de refeies poder variar dependendo de fatores como:  As calorias de que voc necessita.  Os medicamentos que toma.  Seu peso.  Seus nveis de glicose sangunea, presso arterial e colesterol.  Seu nvel de atividade.  Outros quadros clnicos, como doena cardaca ou renal. Como os carboidratos me afetam? Os carboidratos, tambm chamado de acares, afetam o nvel de [employer] do que qualquer outro tipo de [place]. A ingesto de carboidratos aumenta naturalmente a quantidade de glicose no seu sangue. A contagem de carboidratos  um mtodo para acompanhar a quantidade de carboidratos que voc ingere. A contagem de carboidratos  importante para manter sua glicose sangunea em um nvel saudvel, principalmente se voc [place] insulina ou toma certos medicamentos orais para o diabetes.  importante saber quanto de carboidrato voc pode ingerir com segurana em cada refeio. Isso varia de pessoa para pessoa. Seu nutricionista poder ajud-lo a calcular quanto de carboidrato voc deve consumir em cada refeio e em cada lanche. Alimentos que contm carboidratos incluem:  Po, cereais, arroz, massas e bolachas.  Batatas e milho.  Ervilhas, feijo e lentilhas.  Leite e iogurte.  Frutas e sucos.  Sobremesas, como bolos, biscoitos, sorvete e doces. Como o lcool me afeta? O lcool pode causar uma sbita reduo do nvel de glicose sangunea (hipoglicemia),  especialmente se voc usar insulina ou tomar certos medicamentos orais para o diabetes. A hipoglicemia pode ser um quadro clnico potencialmente fatal. Os sintomas de hipoglicemia (sonolncia, vertigem e confuso) so parecidos com os sintomas da ingesto abusiva de lcool. Caso seu mdico diga que o lcool  seguro para voc, siga essas orientaes:  Limite o consumo de bebidas alcolicas a, no mximo, 1 dose por dia para mulheres no grvidas e 2 doses por dia para homens. Uma dose equivale a 12 onas de cerveja, 5 onas de vinho ou 1 ona de bebida destilada.  No beba de estmago vazio.  Mantenha sua hidratao com gua, refrigerante diet ou ch gelado sem acar.  Tenha em mente que o refrigerante normal, suco e outras bebidas podem conter muito acar e devem ser contadas como carboidratos. Quais so as dicas para seguir este plano?  Leia os rtulos dos alimentos  Comece verificando o Musician poro nas "Informaes nutricionais" no rtulo de alimentos e bebidas embalados. A quantidade de calorias, carboidratos, gorduras e outros nutrientes listados no rtulo se baseia em uma poro padro do alimento. Muitos itens contm mais de uma poro por embalagem.  Verifique o total de gramas (g) de carboidratos contidos em uma poro. Voc pode calcular o nmero de pores de carboidratos em uma poro dividindo o total de carboidratos por 15. Por exemplo: supondo que um alimento contenha um total de 30 g de carboidratos, isso seria igual a 2 pores de carboidratos.  Verifique o nmero de gramas (g) de gorduras saturadas e trans em uma poro. Escolha alimentos com pouca ou nenhuma quantidade dessas gorduras.  Verifique a quantidade de miligramas (mg) de sal (sdio) em uma poro. A maioria das pessoas deve limitar o consumo de sdio a 2.300 mg por dia.  Sempre verifique as informaes nutricionais dos alimentos rotulados como de "baixo teor de gordura" e "gordura zero". Esses alimentos  podem conter elevado teor de acar de adio ou carboidratos refinados e devem ser evitados.  Converse com seu nutricionista para identificar suas metas dirias dos nutrientes listados na tabela. No mercado  Evite comprar alimentos enlatados, semiprontos ou processados. Esses alimentos tendem a conter elevado teor de gordura, sdio e acares adicionados.  Escolha itens das reas mais externas da seo de alimentos. Ela inclui frutas e verduras frescas, cereais a granel, carnes frescas e produtos avirios frescos. Na cozinha  Use mtodos de cozimento de baixo calor, como assar, em vez de mtodos de cozimento de Musician, como fritura por imerso.  Cozinhe com leos saudveis para o corao, como de [place], canola ou [place].  Evite cozinhar com manteiga, creme ou carnes com elevado teor de [place]. O planejamento das refeies  Consuma refeies e lanches regularmente, de preferncia nos mesmos horrios todos os dias. Evite ficar longos perodos sem comer.  Coma alimentos ricos em [place], como frutas e verduras frescas, feijo e gros integrais. Converse com seu nutricionista sobre quantas pores de carboidratos voc pode comer em cada refeio.  Coma de 4-6 onas de protena magra por dia, como carne magra, frango, peixe, ovos ou tofu. Uma ona de protena magra  igual a: ? 1 ona de carne, frango ou peixe. ? 1 ovo. ?  xcara de tofu.  Coma alguns alimentos todos os dias que contenham  gorduras saudveis, como abacate, nozes, sementes e peixe. Estilo de vida  Verifique sua glicose sangunea regularmente.  Exercite-se regularmente de acordo com as indicaes do seu mdico. Isso pode incluir: ? 150 minutos de exerccios de intensidade moderada ou de intensidade vigorosa por semana. Pode ser uma caminhada leve, andar de bicicleta ou fazer hidroginstica. ? Alongar e Education officer, environmental exerccios de fora, como ioga ou musculao, pelo menos 2 vezes por semana.  Tome medicamentos somente de  acordo com as orientaes do seu mdico.  No use nenhum produto que contenha nicotina ou tabaco, como cigarros tradicionais e cigarros eletrnicos. Caso precise de ajuda para parar de fumar, fale com seu mdico.  Consulte-se com um especialista em diabetes para identificar estratgias para lidar com o estresse e quaisquer desafios emocionais ou sociais. Perguntas a fazer ao mdico  Preciso me consultar com um especialista em diabetes?  Preciso me consultar com um nutricionista?  Para que nmero devo ligar se tiver dvidas?  Quais so os melhores horrios para verificar minha glicose sangunea? Onde conseguir mais informaes:  Associao de Diabetes [place] Aliment (American Diabetes Association, ADA): diabetes.org  Academia de Nutrio e Diettica (Academy of Nutrition and Dietetics): www.eatright.[employer] of Diabetes and Digestive and Kidney Diseases [facility] de Diabetes e Doenas Digestivas e Renais): CarFlippers.tn Resumo  Um plano de refeies saudveis ajudar voc a controlar sua glicose sangunea e a manter um estilo de vida saudvel.  Consultar um especialista em nutrio (nutricionista) poder ajudar na elaborao do melhor plano para voc.  Tenha em mente que carboidratos (acares) e lcool tm efeitos imediatos sobre seus nveis de glicose sangunea.  importante contar os carboidratos e consumir lcool com cautela. Estas informaes no se destinam a substituir as recomendaes de seu mdico. No deixe de discutir quaisquer dvidas com seu mdico. Document Revised: 09/01/2016 Document Reviewed: 04/23/2016 Elsevier Patient Education  2020 [employer].

## 2019-10-12 NOTE — Assessment & Plan Note (Addendum)
Patient states that she is increasingly more compliant with her glucose lowering medications, taking glipizide, metformin, novolog and lantus regularly, without missing doses. Last visit, her evening Lantus dose was increased from 35 units to 40 units. She states that she has started checking her sugars more regularly. She has had 27 readings over the past month, with glucose ranging from 257 to 483. She has remained above target 100 percent of the time, although has remained asymptomatic this past month. Her sugars have remained above target 100% of the time. Patient has been referred to Lupita Leash previously for diabetic management and patient notes she is trying to limit simple carbohydrates and consume more vegetables and water.  - Increase morning Lantus dose from 35 units to 40 units daily  - Continue Novolog 16 units three times daily - Continue Metformin 1g twice daily - Continue Glipizide 10mg  daily - Continue regular CBG monitoring  - Encouraged continued healthy eating with portion control and regular exercise

## 2019-10-12 NOTE — Progress Notes (Signed)
   CC: Hyperglycemia  HPI:  Robin Klein is a 49 y.o. female with recently-diagnosed type II DM and HTN, presenting for diabetes follow-up. She states that her morning sugars continue to run between 290-360 fasting, and her sugars 2 hours after meals remain elevated around 300. She says she has improved her diet, frequency in which she checks her sugars at home, and frequency in which she takes her prescribed medications. She denies missing any doses. She says she is consuming more vegetables and water and cutting back on simple sugars, although notes she feels hungry between meals. She denies any symptoms at all, including blurred vision, headaches, nausea, vomiting, abdominal pain, polyuria, polydipsia, or numbness or tingling of her extremities.   Past Medical History:  Diagnosis Date  . Depression   . Diabetes mellitus   . Fatty liver disease, nonalcoholic   . Hypertension   . Sinusitis    Social Hx: Patient states that she has never smokes, denies alcohol use, and denies using any other illicit drugs.  Review of Systems:  All others negative except as noted above in HPI.  Physical Exam:  Vitals:   10/12/19 1523  BP: 128/78  Pulse: 92  Temp: 98.4 F (36.9 C)  TempSrc: Oral  SpO2: 97%  Weight: 162 lb 1.6 oz (73.5 kg)  Height: 5\' 2"  (1.575 m)   General: Patient appears overweight. Appears well. No acute distress. Eyes: Sclera non-icteric. No conjunctival injection.  Respiratory: Lungs are CTA, bilaterally. No wheezes, rales, or rhonchi.  Cardiovascular: Regular rate and rhythm. No murmurs, rubs, or gallops. No lower extremity edema. Abdominal: Soft and non-tender to palpation. No rebound or guarding. Skin: Evidence of sun exposure but without other rashes or lesions. Psych: Normal affect. Normal tone of voice.   Assessment & Plan:   See Encounters Tab for problem based charting.  Patient seen with Dr. , MD 10/12/2019, 7:35  PM Pager: [phone]

## 2019-10-13 MED FILL — ATORVASTATIN 40 MG TABLET: 40 | 30 days supply | Qty: 30 | Fill #1

## 2019-10-17 NOTE — Progress Notes (Signed)
Internal Medicine Clinic Attending  I saw and evaluated the patient.  I personally confirmed the key portions of the history and exam documented by Dr. Speakman and I reviewed pertinent patient test results.  The assessment, diagnosis, and plan were formulated together and I agree with the documentation in the resident's note.  

## 2019-10-27 ENCOUNTER — Emergency Department (HOSPITAL_COMMUNITY)
Admission: EM | Admit: 2019-10-27 | Discharge: 2019-10-28 | Disposition: A | Discharge status: Home / Self Care | Payer: Self-pay | Attending: Emergency Medicine | Admitting: Emergency Medicine

## 2019-10-27 ENCOUNTER — Other Ambulatory Visit: Payer: Self-pay

## 2019-10-27 DIAGNOSIS — N939 Abnormal uterine and vaginal bleeding, unspecified: Secondary | ICD-10-CM | POA: Insufficient documentation

## 2019-10-27 DIAGNOSIS — R319 Hematuria, unspecified: Secondary | ICD-10-CM | POA: Insufficient documentation

## 2019-10-27 DIAGNOSIS — Z7984 Long term (current) use of oral hypoglycemic drugs: Secondary | ICD-10-CM | POA: Insufficient documentation

## 2019-10-27 DIAGNOSIS — E785 Hyperlipidemia, unspecified: Secondary | ICD-10-CM | POA: Insufficient documentation

## 2019-10-27 DIAGNOSIS — Z7982 Long term (current) use of aspirin: Secondary | ICD-10-CM | POA: Insufficient documentation

## 2019-10-27 DIAGNOSIS — E1169 Type 2 diabetes mellitus with other specified complication: Secondary | ICD-10-CM | POA: Insufficient documentation

## 2019-10-27 DIAGNOSIS — M545 Low back pain, unspecified: Secondary | ICD-10-CM | POA: Insufficient documentation

## 2019-10-27 DIAGNOSIS — I10 Essential (primary) hypertension: Secondary | ICD-10-CM | POA: Insufficient documentation

## 2019-10-27 LAB — CBC WITH DIFFERENTIAL/PLATELET
Abs Immature Granulocytes: 0.02 10*3/uL (ref 0.00–0.07)
Basophils Absolute: 0.1 10*3/uL (ref 0.0–0.1)
Basophils Relative: 1 %
Eosinophils Absolute: 0.5 10*3/uL (ref 0.0–0.5)
Eosinophils Relative: 6 %
HCT: 43.8 % (ref 36.0–46.0)
Hemoglobin: 15.2 g/dL — ABNORMAL HIGH (ref 12.0–15.0)
Immature Granulocytes: 0 %
Lymphocytes Relative: 46 %
Lymphs Abs: 3.7 10*3/uL (ref 0.7–4.0)
MCH: 29.5 pg (ref 26.0–34.0)
MCHC: 34.7 g/dL (ref 30.0–36.0)
MCV: 84.9 fL (ref 80.0–100.0)
Monocytes Absolute: 0.7 10*3/uL (ref 0.1–1.0)
Monocytes Relative: 9 %
Neutro Abs: 3.1 10*3/uL (ref 1.7–7.7)
Neutrophils Relative %: 38 %
Platelets: 309 10*3/uL (ref 150–400)
RBC: 5.16 MIL/uL — ABNORMAL HIGH (ref 3.87–5.11)
RDW: 12.2 % (ref 11.5–15.5)
WBC: 8 10*3/uL (ref 4.0–10.5)
nRBC: 0 % (ref 0.0–0.2)

## 2019-10-27 NOTE — ED Triage Notes (Signed)
Pt said she has been having severe right flank pain that has gotten severe over the last few days. Pt said there is blood in her urine. Pt said this all started on Monday. Pt says feels like something is trying to bust out of her right side. Taking tylenol but no relief

## 2019-10-28 ENCOUNTER — Emergency Department (HOSPITAL_COMMUNITY): Payer: Self-pay

## 2019-10-28 LAB — URINALYSIS, ROUTINE W REFLEX MICROSCOPIC
Bilirubin Urine: NEGATIVE
Glucose, UA: >=500 mg/dL — AB
Ketones, ur: 5 mg/dL — AB
Leukocytes,Ua: NEGATIVE
Nitrite: NEGATIVE
Protein, ur: NEGATIVE mg/dL
RBC / HPF: >50 RBC/hpf — ABNORMAL HIGH (ref 0–5)
Specific Gravity, Urine: 1.032 — ABNORMAL HIGH (ref 1.005–1.030)
pH: 6 (ref 5.0–8.0)

## 2019-10-28 LAB — BASIC METABOLIC PANEL
Anion gap: 11 (ref 5–15)
BUN: 12 mg/dL (ref 6–20)
CO2: 23 mmol/L (ref 22–32)
Calcium: 8.8 mg/dL — ABNORMAL LOW (ref 8.9–10.3)
Chloride: 102 mmol/L (ref 98–111)
Creatinine, Ser: 0.73 mg/dL (ref 0.44–1.00)
GFR, Estimated: >60 mL/min (ref >60)
Glucose, Bld: 360 mg/dL — ABNORMAL HIGH (ref 70–99)
Potassium: 4 mmol/L (ref 3.5–5.1)
Sodium: 136 mmol/L (ref 135–145)

## 2019-10-28 LAB — I-STAT BETA HCG BLOOD, ED (MC, WL, AP ONLY): I-stat hCG, quantitative: <5 m[IU]/mL (ref <5)

## 2019-10-28 MED ORDER — OXYCODONE HCL 5 MG PO TABS
5.0000 mg | ORAL_TABLET | Freq: Once | ORAL | Status: AC
  Administered 2019-10-28: 5 mg via ORAL
  Filled 2019-10-28: qty 1

## 2019-10-28 MED ORDER — METHOCARBAMOL 500 MG PO TABS
500.0000 mg | ORAL_TABLET | Freq: Once | ORAL | Status: AC
  Administered 2019-10-28: 500 mg via ORAL
  Filled 2019-10-28: qty 1

## 2019-10-28 MED ORDER — MELOXICAM 7.5 MG PO TABS: 7.5000 mg | ORAL_TABLET | Freq: Every day | ORAL | 0 refills | Status: DC

## 2019-10-28 MED ORDER — METHOCARBAMOL 500 MG PO TABS: 500.0000 mg | ORAL_TABLET | Freq: Two times a day (BID) | ORAL | 0 refills | Status: DC

## 2019-10-28 MED ORDER — SODIUM CHLORIDE 0.9 % IV BOLUS
1000.0000 mL | Freq: Once | INTRAVENOUS | Status: AC
  Administered 2019-10-28: 1000 mL via INTRAVENOUS

## 2019-10-28 NOTE — ED Provider Notes (Addendum)
[facility]   CSN: [id number] Arrival date & time: 10/27/19  2323     History Chief Complaint  Patient presents with  . Flank Pain    Robin Klein is a 49 y.o. female with a hx of HTN, IDDM presents to the Emergency Department complaining of gradual, persistent right-sided low back pain onset 5 days ago.  Patient reports on the same day she also developed vaginal bleeding though she was expecting her menstrual cycle.  Patient reports that she recently attempted to move a 40 pound rock out of her garden alone.  She reports the pain in her right lower back is worse with movement and palpation.  She has taken ibuprofen without significant relief, but has had some relief.  She reports she is sexually active with one female partner and no regular protection.  She reports some concern for possible pregnancy given the back pain and vaginal bleeding.  She has not taken a pregnancy test at home.  She denies headache, neck pain, chest pain, shortness of breath, abdominal pain, nausea, vomiting, diarrhea, weakness, dizziness, syncope, loss of bowel or bladder control, numbness, weakness, difficulty walking.  The history is provided by the patient and medical records. No language interpreter was used.       Past Medical History:  Diagnosis Date  . Depression   . Diabetes mellitus   . Fatty liver disease, nonalcoholic   . Hypertension   . Sinusitis     Patient Active Problem List   Diagnosis Date Noted  . Left ear pain 09/04/2019  . Health care maintenance 06/27/2014  . NAFLD (nonalcoholic fatty liver disease) 40/98/119105/26/2011  . Hyperlipidemia 01/03/2007  . Essential hypertension, benign 12/23/2005  . Diabetes mellitus type II, uncontrolled (HCC) 01/05/1997    Past Surgical History:  Procedure Laterality Date  . CESAREAN SECTION       OB History    Gravida  4   Para  3   Term  3   Preterm  0   AB  1   Living  3       SAB  1   TAB      Ectopic  0   Multiple  0   Live Births              Family History  Problem Relation Age of Onset  . Diabetes Mother   . CAD Mother        Died MI 1453  . Diabetes Father   . CAD Father        Died MI 5773  . Cancer Paternal Grandmother     Social History   Tobacco Use  . Smoking status: Never Smoker  . Smokeless tobacco: Never Used  Substance Use Topics  . Alcohol use: No    Alcohol/week: 0.0 standard drinks  . Drug use: No    Home Medications Prior to Admission medications   Medication Sig Start Date End Date Taking? Authorizing Provider  amLODipine (NORVASC) 10 MG tablet Take 1 tablet (10 mg total) by mouth daily. 05/29/19   Burna CashSantos-Sanchez, Idalys, MD  aspirin 81 MG tablet Take 81 mg by mouth daily.    [provider]  atorvastatin (LIPITOR) 40 MG tablet Take 1 tablet (40 mg total) by mouth daily. 02/27/19   Santos-Sanchez, Chelsea PrimusIdalys, MD  glipiZIDE (GLUCOTROL) 10 MG tablet TAKE 1 TABLET BY MOUTH TWICE A DAY BEFORE A MEAL 05/29/19   Burna CashSantos-Sanchez, Idalys, MD  glucose  blood (CONTOUR NEXT TEST) test strip The patient should try to check her blood sugars four times daily - first thing in the morning and before meals. 08/04/19   Glenford Bayley, MD  insulin aspart (NOVOLOG FLEXPEN) 100 UNIT/ML FlexPen Inject 16 Units into the skin 3 (three) times daily with meals. 05/29/19   Santos-Sanchez, Chelsea Primus, MD  insulin glargine (LANTUS SOLOSTAR) 100 UNIT/ML Solostar Pen INJECT 40 UNITS INTO THE SKIN IN THE MORNING AND 40 UNITS AT NIGHT. 10/12/19   Glenford Bayley, MD  Insulin Pen Needle (PEN NEEDLES) 31G X 5 MM MISC Inject 1 each into the skin 4 (four) times daily -  before meals and at bedtime. 06/27/19   Burna Cash, MD  Insulin Syringe-Needle U-100 (GNP INSULIN SYRINGE) 31G X 5/16" 0.5 ML MISC Use to inject insulin daily as instructed 08/30/19   Verdene Lennert, MD  Lancets MISC Use to check blood sugar as directed 08/30/19   Verdene Lennert, MD   meloxicam (MOBIC) 7.5 MG tablet Take 1 tablet (7.5 mg total) by mouth daily. 10/28/19   Tiras Bianchini, Dahlia Client, PA-C  metFORMIN (GLUCOPHAGE) 1000 MG tablet Take 1 tablet (1,000 mg total) by mouth 2 (two) times daily with a meal. 05/29/19   Santos-Sanchez, Chelsea Primus, MD  methocarbamol (ROBAXIN) 500 MG tablet Take 1 tablet (500 mg total) by mouth 2 (two) times daily. 10/28/19   Harlin Mazzoni, Dahlia Client, PA-C    Allergies    Lisinopril, Losartan, and Hctz [hydrochlorothiazide]  Review of Systems   Review of Systems  Constitutional: Negative for appetite change, diaphoresis, fatigue, fever and unexpected weight change.  HENT: Negative for mouth sores.   Eyes: Negative for visual disturbance.  Respiratory: Negative for cough, chest tightness, shortness of breath and wheezing.   Cardiovascular: Negative for chest pain.  Gastrointestinal: Negative for abdominal pain, constipation, diarrhea, nausea and vomiting.  Endocrine: Negative for polydipsia, polyphagia and polyuria.  Genitourinary: Positive for flank pain and vaginal bleeding (normal menstrual cycle). Negative for dysuria, frequency, hematuria and urgency.  Musculoskeletal: Positive for back pain. Negative for neck stiffness.  Skin: Negative for rash.  Allergic/Immunologic: Negative for immunocompromised state.  Neurological: Negative for syncope, light-headedness and headaches.  Hematological: Does not bruise/bleed easily.  Psychiatric/Behavioral: Negative for sleep disturbance. The patient is not nervous/anxious.     Physical Exam Updated Vital Signs BP 135/79 (BP Location: Right Arm)   Pulse 80   Temp 98.2 F (36.8 C) (Oral)   Resp 16   Ht 5' (1.524 m)   Wt 73.5 kg   SpO2 99%   BMI 31.64 kg/m   Physical Exam Vitals and nursing note reviewed.  Constitutional:      General: She is not in acute distress.    Appearance: She is well-developed. She is not diaphoretic.  HENT:     Head: Normocephalic and atraumatic.     Mouth/Throat:      Pharynx: No oropharyngeal exudate.  Eyes:     General: No scleral icterus.    Conjunctiva/sclera: Conjunctivae normal.  Neck:     Comments: Full ROM without pain Cardiovascular:     Rate and Rhythm: Normal rate and regular rhythm.     Pulses: Normal pulses.          Radial pulses are 2+ on the right side and 2+ on the left side.  Pulmonary:     Effort: Pulmonary effort is normal. No tachypnea, accessory muscle usage, prolonged expiration, respiratory distress or retractions.     Breath sounds: Normal breath sounds. No stridor. No  wheezing.     Comments: Equal chest rise. No increased work of breathing. Abdominal:     General: There is no distension.     Palpations: Abdomen is soft.     Tenderness: There is no abdominal tenderness. There is no guarding or rebound.  Musculoskeletal:     Cervical back: Normal range of motion and neck supple.     Comments: Full range of motion of the T-spine and L-spine No midline tenderness to the  T-spine or L-spine Tenderness to palpation of the right paraspinous muscles of the L-spine  Lymphadenopathy:     Cervical: No cervical adenopathy.  Skin:    General: Skin is warm and dry.     Capillary Refill: Capillary refill takes less than 2 seconds.     Findings: No erythema or rash.  Neurological:     Mental Status: She is alert.     GCS: GCS eye subscore is 4. GCS verbal subscore is 5. GCS motor subscore is 6.     Comments: Speech is clear and goal oriented, follows commands Normal 5/5 strength in upper and lower extremities bilaterally including dorsiflexion and plantar flexion, strong and equal grip strength Sensation normal to light and sharp touch Moves extremities without ataxia, coordination intact Normal gait Normal balance No Clonus  Psychiatric:        Mood and Affect: Mood normal.        Behavior: Behavior normal.     ED Results / Procedures / Treatments   Labs (all labs ordered are listed, but only abnormal results are  displayed) Labs Reviewed  CBC WITH DIFFERENTIAL/PLATELET - Abnormal; Notable for the following components:      Result Value   RBC 5.16 (*)    Hemoglobin 15.2 (*)    All other components within normal limits  BASIC METABOLIC PANEL - Abnormal; Notable for the following components:   Glucose, Bld 360 (*)    Calcium 8.8 (*)    All other components within normal limits  URINALYSIS, ROUTINE W REFLEX MICROSCOPIC - Abnormal; Notable for the following components:   Specific Gravity, Urine 1.032 (*)    Glucose, UA >=500 (*)    Hgb urine dipstick LARGE (*)    Ketones, ur 5 (*)    RBC / HPF >50 (*)    Bacteria, UA RARE (*)    All other components within normal limits  URINE CULTURE  I-STAT BETA HCG BLOOD, ED ([facility], [facility], [facility] ONLY)  CBG MONITORING, ED    Radiology CT Renal Stone Study  Result Date: 10/28/2019 CLINICAL DATA:  Flank pain EXAM: CT ABDOMEN AND PELVIS WITHOUT CONTRAST TECHNIQUE: Multidetector CT imaging of the abdomen and pelvis was performed following the standard protocol without IV contrast. COMPARISON:  None. FINDINGS: Lower chest: The visualized heart size within normal limits. No pericardial fluid/thickening. No hiatal hernia. The visualized portions of the lungs are clear. Hepatobiliary: Although limited due to the lack of intravenous contrast, normal in appearance without gross focal abnormality. No evidence of calcified gallstones or biliary ductal dilatation. Pancreas:  Unremarkable.  No surrounding inflammatory changes. Spleen: Normal in size. Although limited due to the lack of intravenous contrast, normal in appearance. Adrenals/Urinary Tract: Both adrenal glands appear normal. The kidneys and collecting system appear normal without evidence of urinary tract calculus or hydronephrosis. Bladder is unremarkable. Stomach/Bowel: The stomach, small bowel, and colon are normal in appearance. No inflammatory changes or obstructive findings. appendix is normal. Vascular/Lymphatic: There  are no enlarged abdominal or pelvic lymph  nodes. Scattered aortic atherosclerotic calcifications are seen without aneurysmal dilatation. Reproductive: The uterus and adnexa are unremarkable. Other: No evidence of abdominal wall mass or hernia. Musculoskeletal: No acute or significant osseous findings. IMPRESSION: No acute intra-abdominal or pelvic pathology to explain the patient's symptoms Electronically Signed   By: Jonna Clark M.D.   On: 10/28/2019 01:16    Procedures Procedures (including critical care time)  Medications Ordered in ED Medications  sodium chloride 0.9 % bolus 1,000 mL (0 mLs Intravenous Stopped 10/28/19 0531)  oxyCODONE (Oxy IR/ROXICODONE) immediate release tablet 5 mg (5 mg Oral Given 10/28/19 0542)  methocarbamol (ROBAXIN) tablet 500 mg (500 mg Oral Given 10/28/19 0542)    ED Course  I have reviewed the triage vital signs and the nursing notes.  Pertinent labs & imaging results that were available during my care of the patient were reviewed by me and considered in my medical decision making (see chart for details).    MDM Rules/Calculators/A&P                           Presents to the emergency department with complaints of flank pain and hematuria.  Labs and imaging ordered in triage included CT renal.  No evidence of pyelonephritis or nephrolithiasis.  On exam abdomen soft and nontender.  Patient reports she is currently menstruating and suspects this is why she is seeing blood in her urine.  Pregnancy test negative.  On exam patient with reproducible right low back pain.  Labs, imaging and clinical exam are reassuring.  Urinalysis without evidence of urinary tract infection but does have blood, consistent with menstruation.  No red flags for back pain including no IV drug use, history of cancer, recent trauma, anticoagulants.  She is well-appearing and pain is well controlled here in the emergency department.  Discussed close follow-up with primary care and reasons to  return to the emergency department.  Patient states understanding and is in agreement with the plan.   Final Clinical Impression(s) / ED Diagnoses Final diagnoses:  Acute right-sided low back pain without sciatica    Rx / DC Orders ED Discharge Orders         Ordered    methocarbamol (ROBAXIN) 500 MG tablet  2 times daily        10/28/19 0650    meloxicam (MOBIC) 7.5 MG tablet  Daily        10/28/19 0650             Josiel Gahm, Dahlia Client, PA-C 10/28/19 0703    Cardama, Amadeo Garnet, MD 10/28/19 [phone]

## 2019-10-28 NOTE — Discharge Instructions (Addendum)
1. Medications: robaxin, meloxicam, usual home medications 2. Treatment: rest, drink plenty of fluids, gentle stretching as discussed, alternate ice and heat 3. Follow Up: Please followup with your primary doctor in 3 days for discussion of your diagnoses and further evaluation after today's visit; Return to the ER for worsening back pain, difficulty walking, loss of bowel or bladder control or other concerning symptoms

## 2019-10-28 NOTE — ED Notes (Signed)
ED Provider at bedside. 

## 2019-10-30 LAB — URINE CULTURE: Culture: >=100000 — AB

## 2019-10-31 ENCOUNTER — Telehealth: Payer: Self-pay | Admitting: Emergency Medicine

## 2019-10-31 MED FILL — UNIFINE PENTIPS 31GX3/16: 31G X 5 MM | 25 days supply | Qty: 100 | Fill #3

## 2019-10-31 NOTE — Telephone Encounter (Signed)
Post ED Visit - Positive Culture Follow-up: Successful Patient Follow-Up  Culture assessed and recommendations reviewed by:  []  , Pharm.D. []  Enzo Bi, Pharm.D., BCPS AQ-ID []  , Pharm.D., BCPS []  Celedonio Miyamoto, .D., BCPS []  [place], .D., BCPS, AAHIVP []  Georgina Pillion, Pharm.D., BCPS, AAHIVP []  [address], PharmD, BCPS []  , PharmD, BCPS []  [place], PharmD, BCPS []  [address], PharmD [place] PharmD  Positive urine culture  []  Patient discharged without antimicrobial prescription and treatment is now indicated []  Organism is resistant to prescribed ED discharge antimicrobial []  Patient with positive blood cultures  Changes discussed with ED provider: Estella Husk PA New antibiotic prescription symptom check, if + start Cephalexin 500mg  po q 6 hours x 5 days   Attempting to contact patient           10/31/2019, 12:05 PM

## 2019-10-31 NOTE — Progress Notes (Signed)
ED Antimicrobial Stewardship Positive Culture Follow Up   Robin Klein is an 49 y.o. female who presented to [facility] on 10/27/2019 with a chief complaint of  Chief Complaint  Patient presents with   Flank Pain    Recent Results (from the past 720 hour(s))  Urine culture     Status: Abnormal   Collection Time: 10/28/19  5:53 AM   Specimen: Urine, Random  Result Value Ref Range Status   Specimen Description URINE, RANDOM  Final   Special Requests   Final    NONE Performed at [facility], [address], [place], [place] [address]    Culture >=100,000 COLONIES/mL ESCHERICHIA COLI (A)  Final   Report Status 10/30/2019 FINAL  Final   Organism ID, Bacteria ESCHERICHIA COLI (A)  Final      Susceptibility   Escherichia coli - MIC*    AMPICILLIN >=32 RESISTANT Resistant     CEFAZOLIN <=4 SENSITIVE Sensitive     CEFTRIAXONE <=0.25 SENSITIVE Sensitive     CIPROFLOXACIN <=0.25 SENSITIVE Sensitive     GENTAMICIN <=1 SENSITIVE Sensitive     IMIPENEM <=0.25 SENSITIVE Sensitive     NITROFURANTOIN <=16 SENSITIVE Sensitive     TRIMETH/SULFA <=20 SENSITIVE Sensitive     AMPICILLIN/SULBACTAM 16 INTERMEDIATE Intermediate     PIP/TAZO <=4 SENSITIVE Sensitive     * >=100,000 COLONIES/mL ESCHERICHIA COLI   [x]  Patient discharged originally without antimicrobial agent and treatment is now indicated  New antibiotic prescription: Flow Manager to call for symptom check. If patient experiencing urinary symptoms (dysuria, frequency, etc.), then start cephalexin 500mg  PO Q6h x 5 days. If no urinary symptoms, no further treatment indicated for asymptomatic bacteriuria.  ED Provider: , PA-C   [place] 10/31/2019, 8:03 AM Clinical Pharmacist Monday - Friday phone -  [phone] Saturday - Sunday phone - [phone]

## 2019-11-10 ENCOUNTER — Encounter: Payer: Self-pay | Admitting: Student

## 2019-11-10 ENCOUNTER — Other Ambulatory Visit: Payer: Self-pay

## 2019-11-10 ENCOUNTER — Other Ambulatory Visit: Payer: Self-pay | Admitting: Student

## 2019-11-10 ENCOUNTER — Ambulatory Visit: Payer: Self-pay | Admitting: Student

## 2019-11-10 VITALS — BP 131/92 | HR 79 | Temp 98.2°F | Ht 62.0 in | Wt 157.2 lb

## 2019-11-10 DIAGNOSIS — E1165 Type 2 diabetes mellitus with hyperglycemia: Secondary | ICD-10-CM

## 2019-11-10 DIAGNOSIS — M545 Low back pain, unspecified: Secondary | ICD-10-CM

## 2019-11-10 LAB — GLUCOSE, CAPILLARY: Glucose-Capillary: 251 mg/dL — ABNORMAL HIGH (ref 70–99)

## 2019-11-10 LAB — POCT GLYCOSYLATED HEMOGLOBIN (HGB A1C): Hemoglobin A1C: 12.3 % — AB (ref 4.0–5.6)

## 2019-11-10 MED ORDER — CYCLOBENZAPRINE HCL 5 MG PO TABS: 5.0000 mg | ORAL_TABLET | Freq: Three times a day (TID) | ORAL | 0 refills | Status: DC | PRN

## 2019-11-10 MED FILL — CYCLOBENZAPRINE HCL 5 MG TA: 5 | 17 days supply | Qty: 50 | Fill #0

## 2019-11-10 NOTE — Progress Notes (Signed)
CC: Low back pain  HPI:  [name] is a 49 y.o. female with a past medical history stated below and presents today for right sided back pain and resolved-vaginal bleeding . Please see problem based assessment and plan for additional details.   Spanish translator assisted during patient's encounter   Past Medical History:  Diagnosis Date  . Depression   . Diabetes mellitus   . Fatty liver disease, nonalcoholic   . Hypertension   . Sinusitis     Current Outpatient Medications on File Prior to Visit  Medication Sig Dispense Refill  . amLODipine (NORVASC) 10 MG tablet Take 1 tablet (10 mg total) by mouth daily. 90 tablet 3  . aspirin 81 MG tablet Take 81 mg by mouth daily.    Marland Kitchen atorvastatin (LIPITOR) 40 MG tablet Take 1 tablet (40 mg total) by mouth daily. 30 tablet 10  . glipiZIDE (GLUCOTROL) 10 MG tablet TAKE 1 TABLET BY MOUTH TWICE A DAY BEFORE A MEAL 60 tablet 11  . glucose blood (CONTOUR NEXT TEST) test strip The patient should try to check her blood sugars four times daily - first thing in the morning and before meals. 100 each 12  . insulin aspart (NOVOLOG FLEXPEN) 100 UNIT/ML FlexPen Inject 16 Units into the skin 3 (three) times daily with meals. 15 mL 12  . insulin glargine (LANTUS SOLOSTAR) 100 UNIT/ML Solostar Pen INJECT 40 UNITS INTO THE SKIN IN THE MORNING AND 40 UNITS AT NIGHT. 15 mL   . Insulin Pen Needle (PEN NEEDLES) 31G X 5 MM MISC Inject 1 each into the skin 4 (four) times daily -  before meals and at bedtime. 100 each 11  . Insulin Syringe-Needle U-100 (GNP INSULIN SYRINGE) 31G X 5/16" 0.5 ML MISC Use to inject insulin daily as instructed 100 each 3  . Lancets MISC Use to check blood sugar as directed 100 each 3  . meloxicam (MOBIC) 7.5 MG tablet Take 1 tablet (7.5 mg total) by mouth daily. 30 tablet 0  . metFORMIN (GLUCOPHAGE) 1000 MG tablet Take 1 tablet (1,000 mg total) by mouth 2 (two) times daily with a meal. 180 tablet 3  . methocarbamol  (ROBAXIN) 500 MG tablet Take 1 tablet (500 mg total) by mouth 2 (two) times daily. 20 tablet 0   No current facility-administered medications on file prior to visit.    Family History  Problem Relation Age of Onset  . Diabetes Mother   . CAD Mother        Died MI 57  . Diabetes Father   . CAD Father        Died MI 72  . Cancer Paternal Grandmother     Social History   Socioeconomic History  . Marital status: Married    Spouse name: Not on file  . Number of children: Not on file  . Years of education: 7  . Highest education level: Not on file  Occupational History    Employer: UNEMPLOYED  Tobacco Use  . Smoking status: Never Smoker  . Smokeless tobacco: Never Used  Substance and Sexual Activity  . Alcohol use: No    Alcohol/week: 0.0 standard drinks  . Drug use: No  . Sexual activity: Not on file  Other Topics Concern  . Not on file  Social History Narrative   Financial assistance approved for 100% discount at [facility] and has [facility] card per [name]   12/16/2009   Recently gave birth to a son 04/2009.  Social Determinants of Health   Financial Resource Strain:   . Difficulty of Paying Living Expenses: Not on file  Food Insecurity:   . Worried About Programme researcher, broadcasting/film/video in the Last Year: Not on file  . Ran Out of Food in the Last Year: Not on file  Transportation Needs:   . Lack of Transportation (Medical): Not on file  . Lack of Transportation (Non-Medical): Not on file  Physical Activity:   . Days of Exercise per Week: Not on file  . Minutes of Exercise per Session: Not on file  Stress:   . Feeling of Stress : Not on file  Social Connections:   . Frequency of Communication with Friends and Family: Not on file  . Frequency of Social Gatherings with Friends and Family: Not on file  . Attends Religious Services: Not on file  . Active Member of Clubs or Organizations: Not on file  . Attends Banker Meetings: Not on file  . Marital Status: Not on  file  Intimate Partner Violence:   . Fear of Current or Ex-Partner: Not on file  . Emotionally Abused: Not on file  . Physically Abused: Not on file  . Sexually Abused: Not on file   Review of Systems: ROS negative except for what is noted on the assessment and plan.  Vitals:   11/10/19 1358 11/10/19 1411  BP: (!) 146/89 (!) 131/92  Pulse: 84 79  Temp: 98.2 F (36.8 C)   TempSrc: Oral   SpO2: 100%   Weight: 157 lb 3.2 oz (71.3 kg)   Height: 5\' 2"  (1.575 m)    Physical Exam: Physical Exam Vitals and nursing note reviewed.  Constitutional:      General: She is not in acute distress.    Appearance: Normal appearance. She is normal weight. She is not ill-appearing or toxic-appearing.  Cardiovascular:     Rate and Rhythm: Normal rate and regular rhythm.     Heart sounds: Normal heart sounds. No murmur heard.  No friction rub. No gallop.   Pulmonary:     Effort: Pulmonary effort is normal. No respiratory distress.     Breath sounds: Normal breath sounds. No stridor. No wheezing, rhonchi or rales.  Abdominal:     General: Abdomen is flat. Bowel sounds are normal. There is no distension.     Palpations: Abdomen is soft. There is no mass.     Tenderness: There is abdominal tenderness (RLQ, RUQ). There is no right CVA tenderness, left CVA tenderness, guarding or rebound.     Hernia: No hernia is present.  Musculoskeletal:        General: Tenderness present. No swelling or deformity.     Right lower leg: No edema.     Left lower leg: No edema.     Comments: Tender to palpate right lower back, right mid back, right mid/lower side. No erythema present  Skin:    General: Skin is warm and dry.  Neurological:     General: No focal deficit present.     Mental Status: She is alert and oriented to person, place, and time. Mental status is at baseline.  Psychiatric:        Mood and Affect: Mood normal.        Behavior: Behavior normal.        Thought Content: Thought content normal.        Assessment & Plan:   See Encounters Tab for problem based charting.  Patient seen with  Dr. Mackey Birchwood, D.O. [facility], PGY-1 Pager: [phone], Phone: [phone] Date 11/10/2019 Time 6:01 PM

## 2019-11-10 NOTE — Patient Instructions (Signed)
  Gracias por permitirnos cuidar de usted! Hoy hablamos de tu dolor de espalda. Parece ser un dolor muscular. Le escribiremos una nueva receta para un relajante muscular que puede recoger en la farmacia. Tambin puede comprar biocongelante y tomar ibuprofeno.  Si el dolor empeora o tiene nuevos sntomas, llame a la clnica. Si cree que la situacin es Radio broadcast assistant, llame al 911.   Stop the following medications: There are no discontinued medications.   Start the following medications: Meds ordered this encounter  Medications  . cyclobenzaprine (FLEXERIL) 5 MG tablet    Sig: Take 1 tablet (5 mg total) by mouth 3 (three) times daily as needed for muscle spasms.    Dispense:  50 tablet    Refill:  0     Follow up: 2 weeks   si tienes alguna pregunta, por favor llama [phone].     Thalia Bloodgood, D.O. [facility]

## 2019-11-11 NOTE — Assessment & Plan Note (Addendum)
Assessment: Pt presented to ED on 10/27/19 for right sided back pain, onset 5 days prior. Patient endorses moving 40 lb rock and lifting bottles of water. Back pain was not immediate, occurred gradually and become increasingly painful. Minimal relief with ibuprofen. Of note, patient also endorsed having vaginal bleeding around the time back pain onset. There was initial concern for possible pregnancy as patient had intercourse 15 days prior to pain starting. No form of contraception was used. No other symptoms present.   While in ED, renal CT was negative for any acute process. Urinalysis + large hgb, negative leuks/nitr. Beta HCG negative. Dx with acute right sided back pain w/o sciatica and prescribed robaxin/mobic with follow up.   Presented to clinic continuing to endorse right sided back pain. She endorsed improvement since being seen in the ED and relief with ibuprofen, robaxin, and icyhot cream. Did not try ice or heat to the area. Denied urinary symptoms. Stated vaginal bleeding was heavy at first and then lightened up after. History of G4P3A1.   Physical exam revealed patient to be tender to deep palpation over right lower back, right side. Minimally tender to right lower and upper quadrants with deep palpation. No erythema, swelling, warmth, deformity appreciated. Patient endorsed right sided pain with rotating upper body to the left. Minimal pain with side bending. Negative for neurological symptoms, without sciatic symptoms.  After H/P, suspect that this is msk in nature. Due to significant pain with deep palpation and location of tender points, suspecting patient with injury to deep muscles; internal oblique/erector spinae muscles. Reassuring that pain worsens with movement and palpation, improving with muscle relaxer/nsaid as well as negative imaging, resolution of vaginal bleeding, and benign lab work. Do not suspect pyelo, intraabdominal process at this time. No trauma, so do not suspect  retroperitoneal pathology. Suspect vaginal bleeding is due to menstruation, do not suspect relationship between back pain and vaginal bleeding  Plan: -Flexeril 5mg  TID for 15 days -OTC Biofreeze -Ibuprofen 800 mg BID PRN -Patient to be evaluated in 2 weeks for diabetes, will reassess back pain at that time -Work note provided;no lifting over 20 lbs, limited overhead lifting

## 2019-11-13 NOTE — Progress Notes (Signed)
Internal Medicine Clinic Attending  I saw and evaluated the patient.  I carefully and personally confirmed the key portions of the history and exam documented by Dr. Evie Lacks and I reviewed pertinent patient test results.  The assessment, diagnosis, and plan were formulated together and I agree with the documentation in the resident's note.

## 2019-11-15 MED FILL — LANTUS SOLOSTAR 100 UNITS/M: 100 | 26 days supply | Qty: 18 | Fill #3

## 2019-11-15 MED FILL — NOVOLOG FLEXPEN SYRINGE: 100 | 25 days supply | Qty: 12 | Fill #2

## 2019-11-24 ENCOUNTER — Encounter: Payer: Self-pay | Admitting: Student

## 2019-12-05 MED FILL — UNIFINE PENTIPS 31GX3/16: 31G X 5 MM | 25 days supply | Qty: 100 | Fill #4

## 2019-12-05 MED FILL — CONTOUR NEXT TEST STRP: 25 days supply | Qty: 100 | Fill #1

## 2020-01-15 MED FILL — LANTUS SOLOSTAR 100 UNITS/M: 100 | 26 days supply | Qty: 18 | Fill #4

## 2020-02-20 MED FILL — UNIFINE PENTIPS 31GX3/16: 31G X 5 MM | 25 days supply | Qty: 100 | Fill #5

## 2020-02-20 MED FILL — LANTUS SOLOSTAR 100 UNITS/M: 100 | 26 days supply | Qty: 18 | Fill #5

## 2020-02-20 MED FILL — AMLODIPINE BESYLATE 10 MG T: 10 | 90 days supply | Qty: 90 | Fill #3

## 2020-02-20 MED FILL — NOVOLOG FLEXPEN SYRINGE: 100 | 25 days supply | Qty: 12 | Fill #3

## 2020-04-06 ENCOUNTER — Other Ambulatory Visit (HOSPITAL_COMMUNITY): Payer: Self-pay

## 2020-04-06 ENCOUNTER — Other Ambulatory Visit: Payer: Self-pay | Admitting: Internal Medicine

## 2020-04-06 DIAGNOSIS — E1165 Type 2 diabetes mellitus with hyperglycemia: Secondary | ICD-10-CM

## 2020-04-08 ENCOUNTER — Other Ambulatory Visit (HOSPITAL_COMMUNITY): Payer: Self-pay

## 2020-04-08 MED ORDER — LANTUS SOLOSTAR 100 UNIT/ML ~~LOC~~ SOPN
40.0000 [IU] | PEN_INJECTOR | Freq: Two times a day (BID) | SUBCUTANEOUS | 3 refills | Status: DC
  Filled 2020-04-08: qty 72, 90d supply, fill #0
  Filled 2020-04-09: qty 18, 23d supply, fill #0
  Filled 2020-04-17: qty 24, 30d supply, fill #0
  Filled 2020-06-06: qty 24, 30d supply, fill #1

## 2020-04-08 NOTE — Telephone Encounter (Signed)
Per last OV note from PCP in 10/2019, patient to be on Lantus 40U in AM and PM. Rx updated to reflect this.

## 2020-04-09 ENCOUNTER — Other Ambulatory Visit (HOSPITAL_COMMUNITY): Payer: Self-pay

## 2020-04-09 MED ORDER — LANTUS SOLOSTAR 100 UNIT/ML ~~LOC~~ SOPN: PEN_INJECTOR | SUBCUTANEOUS | Status: DC

## 2020-04-09 NOTE — Addendum Note (Signed)
Addended by: Eliezer Bottom on: 04/09/2020 07:37 AM   Modules accepted: Orders

## 2020-04-17 ENCOUNTER — Other Ambulatory Visit: Payer: Self-pay | Admitting: Internal Medicine

## 2020-04-17 ENCOUNTER — Other Ambulatory Visit (HOSPITAL_COMMUNITY): Payer: Self-pay

## 2020-04-17 DIAGNOSIS — E1165 Type 2 diabetes mellitus with hyperglycemia: Secondary | ICD-10-CM

## 2020-04-18 ENCOUNTER — Other Ambulatory Visit (HOSPITAL_COMMUNITY): Payer: Self-pay

## 2020-04-18 MED ORDER — METFORMIN HCL 1000 MG PO TABS
1000.0000 mg | ORAL_TABLET | Freq: Two times a day (BID) | ORAL | 3 refills | Status: DC
  Filled 2020-04-18: qty 60, 30d supply, fill #0

## 2020-04-26 ENCOUNTER — Other Ambulatory Visit (HOSPITAL_COMMUNITY): Payer: Self-pay

## 2020-05-22 ENCOUNTER — Encounter: Payer: Self-pay | Admitting: Student

## 2020-05-22 ENCOUNTER — Ambulatory Visit (INDEPENDENT_AMBULATORY_CARE_PROVIDER_SITE_OTHER): Payer: Self-pay | Admitting: Student

## 2020-05-22 ENCOUNTER — Other Ambulatory Visit: Payer: Self-pay

## 2020-05-22 ENCOUNTER — Other Ambulatory Visit (HOSPITAL_COMMUNITY): Payer: Self-pay

## 2020-05-22 VITALS — BP 141/83 | HR 85 | Temp 98.7°F | Ht 62.0 in | Wt 150.8 lb

## 2020-05-22 DIAGNOSIS — I10 Essential (primary) hypertension: Secondary | ICD-10-CM

## 2020-05-22 DIAGNOSIS — E1165 Type 2 diabetes mellitus with hyperglycemia: Secondary | ICD-10-CM

## 2020-05-22 DIAGNOSIS — E782 Mixed hyperlipidemia: Secondary | ICD-10-CM

## 2020-05-22 LAB — GLUCOSE, CAPILLARY: Glucose-Capillary: 279 mg/dL — ABNORMAL HIGH (ref 70–99)

## 2020-05-22 LAB — POCT GLYCOSYLATED HEMOGLOBIN (HGB A1C): Hemoglobin A1C: 11.3 % — AB (ref 4.0–5.6)

## 2020-05-22 MED ORDER — METFORMIN HCL 1000 MG PO TABS
1000.0000 mg | ORAL_TABLET | Freq: Two times a day (BID) | ORAL | 3 refills | Status: DC
  Filled 2020-05-22: qty 60, 30d supply, fill #0

## 2020-05-22 MED ORDER — AMLODIPINE BESYLATE 10 MG PO TABS
10.0000 mg | ORAL_TABLET | Freq: Every day | ORAL | 3 refills | Status: DC
  Filled 2020-05-22: qty 30, 30d supply, fill #0
  Filled 2020-07-16: qty 30, 30d supply, fill #1
  Filled 2020-09-11: qty 30, 30d supply, fill #2
  Filled 2020-10-25: qty 30, 30d supply, fill #3

## 2020-05-22 NOTE — Assessment & Plan Note (Signed)
Initial blood pressure 120/97.  Repeat 141/83.  Patient will work on continue exercise, healthier diet and weight loss.  We will repeat blood pressure at next visit.

## 2020-05-22 NOTE — Assessment & Plan Note (Signed)
Patient have history of uncontrolled diabetes.  She reports feeling well today, denies nausea, vomiting, abdominal pain.  She states that she forgets to take insulin sometimes.  She checks fasting CBG few times a week, does not check mealtime CBG.  States that her fasting CBG usually range from 292 to 320.  Denies any hypoglycemic events.  She state that she has been trying to cut back on sodas and exercise more.  She has lost 7 pounds since last visit.  States that she ran out of her metformin 3 weeks ago.  Report compliant with other medication.  Assessment and plan Diabetes remains uncontrolled, A1c is slightly better at 11.3.  Patient is on very high dose of insulin without much improvement.  Questionable compliance.  Patient was on GLP-1 in the past but could not tolerate due to GI side effects.  Patient prefer not to start a new medication at this time.   It is difficult to adjust insulin regimen without her blood sugar log.  In addition she is at risk for hypoglycemia with her very high dose insulin.  Will advise patient to check her CBG at fasting and 3 times with meals for 2 weeks.  Advised patient to bring her glucometer to the clinic so we can assess her blood sugar before making any adjustment.  -Continue Lantus 40 u twice daily -NovoLog 16 u 3 times daily -Refill metformin -She is due for an eye exam.  She will contact her neurologist office for appointment

## 2020-05-22 NOTE — Progress Notes (Signed)
   CC: Follow up on DM and HTN  HPI:  [name] is a 50 y.o. with past medical history of uncontrolled diabetes, hypertension, NAFLD, who presented to the clinic for diabetes follow-up.   Please see problem based charting for details  Past Medical History:  Diagnosis Date  . Depression   . Diabetes mellitus   . Fatty liver disease, nonalcoholic   . Hypertension   . Sinusitis    Review of Systems: As per HPI  Physical Exam:  Vitals:   05/22/20 1349 05/22/20 1431  BP: (!) 128/97 (!) 141/83  Pulse: 89 85  Temp: 98.7 F (37.1 C)   TempSrc: Oral   SpO2: 93%   Weight: 150 lb 12.8 oz (68.4 kg)   Height: 5\' 2"  (1.575 m)    Physical Exam Constitutional:      General: She is not in acute distress.    Appearance: She is not toxic-appearing.  HENT:     Head: Normocephalic.  Eyes:     General: No scleral icterus.       Right eye: No discharge.        Left eye: No discharge.     Conjunctiva/sclera: Conjunctivae normal.  Cardiovascular:     Rate and Rhythm: Normal rate and regular rhythm.     Heart sounds: Normal heart sounds.     Comments: No LE edema Pulmonary:     Effort: Pulmonary effort is normal. No respiratory distress.     Breath sounds: No wheezing.  Abdominal:     General: Bowel sounds are normal. There is no distension.     Tenderness: There is no abdominal tenderness.  Musculoskeletal:     Comments: No wounds or ulcer of bilateral feet.  +2 pedis pulses bilaterally  Skin:    General: Skin is warm.  Neurological:     Mental Status: She is alert.  Psychiatric:        Mood and Affect: Mood normal.        Thought Content: Thought content normal.        Judgment: Judgment normal.     Assessment & Plan:   See Encounters Tab for problem based charting.  Patient discussed with Dr. 

## 2020-05-22 NOTE — Patient Instructions (Signed)
[name],  Robin Klein agradable verte en la clnica hoy. Este es un resumen de lo que hablamos:  1. Diabetes: Su A1c fue de 11.3, que sigue siendo muy alta. Est recibiendo una dosis muy alta de insulina, lo que lo pone en mayor riesgo de niveles bajos de Banker.  Mida su nivel de azcar en la sangre a primera hora de la maana y 3 veces con las comidas durante las prximas 2 semanas, y Diplomatic Services operational officer glucmetro a Technical sales engineer. Basndonos en esos datos, ajustaremos su insulina en consecuencia.  2. Presin arterial alta: volveremos a controlar la presin arterial en la prxima visita.  Cudate  It was nice seeing you in clinic today.  Here is a summary of what we talked about:  1.  Diabetes: Your A1c was 11.3, which is still very high.  You are on a very high dose of insulin which puts you at higher risk for low blood sugar.  Please measure your blood sugar first thing in the morning and 3 times with meal for the next 2 weeks, and bring the glucometer back to our clinic.  Based on that data we will adjust your insulin accordingly.  2.  High blood pressure: We will recheck a blood pressure as next visit.  Take care  Dr. Cyndie Chime

## 2020-05-22 NOTE — Assessment & Plan Note (Signed)
-  Continue atorvastatin 40 mg.

## 2020-05-23 ENCOUNTER — Other Ambulatory Visit (HOSPITAL_COMMUNITY): Payer: Self-pay

## 2020-05-23 MED FILL — Insulin Pen Needle 31 G X 5 MM (1/5" or 3/16"): 25 days supply | Qty: 100 | Fill #0 | Status: AC

## 2020-05-23 NOTE — Progress Notes (Signed)
Internal Medicine Clinic Attending  Case discussed with Dr. Nguyen  At the time of the visit.  We reviewed the resident's history and exam and pertinent patient test results.  I agree with the assessment, diagnosis, and plan of care documented in the resident's note. 

## 2020-05-27 ENCOUNTER — Encounter (HOSPITAL_COMMUNITY): Payer: Self-pay

## 2020-05-27 ENCOUNTER — Other Ambulatory Visit: Payer: Self-pay

## 2020-05-27 ENCOUNTER — Emergency Department (HOSPITAL_COMMUNITY): Payer: Self-pay

## 2020-05-27 ENCOUNTER — Emergency Department (HOSPITAL_COMMUNITY)
Admission: EM | Admit: 2020-05-27 | Discharge: 2020-05-28 | Disposition: A | Discharge status: Home / Self Care | Payer: Self-pay | Attending: Emergency Medicine | Admitting: Emergency Medicine

## 2020-05-27 DIAGNOSIS — Y9389 Activity, other specified: Secondary | ICD-10-CM | POA: Insufficient documentation

## 2020-05-27 DIAGNOSIS — Z794 Long term (current) use of insulin: Secondary | ICD-10-CM | POA: Insufficient documentation

## 2020-05-27 DIAGNOSIS — M25559 Pain in unspecified hip: Secondary | ICD-10-CM

## 2020-05-27 DIAGNOSIS — I10 Essential (primary) hypertension: Secondary | ICD-10-CM | POA: Insufficient documentation

## 2020-05-27 DIAGNOSIS — Z79899 Other long term (current) drug therapy: Secondary | ICD-10-CM | POA: Insufficient documentation

## 2020-05-27 DIAGNOSIS — E119 Type 2 diabetes mellitus without complications: Secondary | ICD-10-CM | POA: Insufficient documentation

## 2020-05-27 DIAGNOSIS — W19XXXA Unspecified fall, initial encounter: Secondary | ICD-10-CM | POA: Insufficient documentation

## 2020-05-27 DIAGNOSIS — Y9259 Other trade areas as the place of occurrence of the external cause: Secondary | ICD-10-CM | POA: Insufficient documentation

## 2020-05-27 DIAGNOSIS — M25511 Pain in right shoulder: Secondary | ICD-10-CM | POA: Insufficient documentation

## 2020-05-27 DIAGNOSIS — Z7984 Long term (current) use of oral hypoglycemic drugs: Secondary | ICD-10-CM | POA: Insufficient documentation

## 2020-05-27 DIAGNOSIS — M25551 Pain in right hip: Secondary | ICD-10-CM | POA: Insufficient documentation

## 2020-05-27 DIAGNOSIS — Z7982 Long term (current) use of aspirin: Secondary | ICD-10-CM | POA: Insufficient documentation

## 2020-05-27 LAB — URINALYSIS, ROUTINE W REFLEX MICROSCOPIC
Bilirubin Urine: NEGATIVE
Glucose, UA: >=500 mg/dL — AB
Hgb urine dipstick: NEGATIVE
Ketones, ur: NEGATIVE mg/dL
Leukocytes,Ua: NEGATIVE
Nitrite: NEGATIVE
Protein, ur: 100 mg/dL — AB
Specific Gravity, Urine: 1.032 — ABNORMAL HIGH (ref 1.005–1.030)
pH: 5 (ref 5.0–8.0)

## 2020-05-27 LAB — POC URINE PREG, ED: Preg Test, Ur: NEGATIVE

## 2020-05-27 NOTE — ED Triage Notes (Signed)
Pt BIB [facility], pt had a mechanical fall while shopping at the mall today. Pt c/o pain to Right shoulder, hip and foot. c-collar placed by EMS No LOC, N/V. Denies blood thinners   CBG 329 normal per pt 300's  BP 156/90 HR 76 98% RA  97.2  RR 18

## 2020-05-27 NOTE — ED Provider Notes (Signed)
Emergency Medicine Provider Triage Evaluation Note  Robin Klein , a 50 y.o. female  was evaluated in triage.  Pt complains of headache, right hip pain, right shoulder pain, left foot pain travel.  No loss of consciousness or blood thinner use.  Review of Systems  Positive: Headache, myalgias Negative: Loss of consciousness  Physical Exam  BP (!) 153/86 (BP Location: Right Arm)   Pulse 76   Temp 98.5 F (36.9 C)   Resp 17   SpO2 99%  Gen:   Awake, no distress   Resp:  Normal effort  MSK:   Moves extremities without difficulty  Other:  No facial asymmetry, TTP of C spine, L foot, R hip without deformities or changes to ROM  Medical Decision Making  Medically screening exam initiated at 4:48 PM.  Appropriate orders placed.  Robin Klein was informed that the remainder of the evaluation will be completed by another provider, this initial triage assessment does not replace that evaluation, and the importance of remaining in the ED until their evaluation is complete.  Imaging ordered.   Dietrich Pates, PA-C 05/27/20 1650    Wynetta Fines, MD 05/27/20 2329

## 2020-05-28 ENCOUNTER — Other Ambulatory Visit (HOSPITAL_COMMUNITY): Payer: Self-pay

## 2020-05-28 MED ORDER — IBUPROFEN 800 MG PO TABS
800.0000 mg | ORAL_TABLET | Freq: Once | ORAL | Status: AC
  Administered 2020-05-28: 800 mg via ORAL
  Filled 2020-05-28: qty 1

## 2020-05-28 MED ORDER — CYCLOBENZAPRINE HCL 10 MG PO TABS
10.0000 mg | ORAL_TABLET | Freq: Three times a day (TID) | ORAL | 0 refills | Status: DC | PRN
  Filled 2020-05-28: qty 10, 4d supply, fill #0

## 2020-05-28 MED ORDER — IBUPROFEN 800 MG PO TABS
800.0000 mg | ORAL_TABLET | Freq: Three times a day (TID) | ORAL | 0 refills | Status: DC
  Filled 2020-05-28: qty 21, 7d supply, fill #0

## 2020-05-28 NOTE — ED Notes (Signed)
All appropriate discharge materials reviewed at length with patient. Time for questions provided. Pt has no other questions at this time and verbalizes understanding of all provided materials.  

## 2020-05-28 NOTE — ED Provider Notes (Signed)
[facility]   CSN: [id number] Arrival date & time: 05/27/20  1645     History Chief Complaint  Patient presents with  . Fall    Robin Klein is a 50 y.o. female.  Patient presents to the emergency department with a chief complaint of fall.  She states that she fell while at the mall today.  She landed on her right hip and right shoulder.  She complains of pain in these locations.  She is able to ambulate.  She states her symptoms are worsened with movement and palpation.  She denies head injury or loss of consciousness.  She is not anticoagulated.  Denies any treatment prior to arrival.  The history is provided by the patient. No language interpreter was used.       Past Medical History:  Diagnosis Date  . Depression   . Diabetes mellitus   . Fatty liver disease, nonalcoholic   . Hypertension   . Sinusitis     Patient Active Problem List   Diagnosis Date Noted  . Health care maintenance 06/27/2014  . Low back pain 07/24/2010  . NAFLD (nonalcoholic fatty liver disease) 11/91/478205/26/2011  . Hyperlipidemia 01/03/2007  . Essential hypertension, benign 12/23/2005  . Diabetes mellitus type II, uncontrolled (HCC) 01/05/1997    Past Surgical History:  Procedure Laterality Date  . CESAREAN SECTION       OB History    Gravida  4   Para  3   Term  3   Preterm  0   AB  1   Living  3     SAB  1   IAB      Ectopic  0   Multiple  0   Live Births              Family History  Problem Relation Age of Onset  . Diabetes Mother   . CAD Mother        Died MI 7253  . Diabetes Father   . CAD Father        Died MI 8673  . Cancer Paternal Grandmother     Social History   Tobacco Use  . Smoking status: Never Smoker  . Smokeless tobacco: Never Used  Substance Use Topics  . Alcohol use: No    Alcohol/week: 0.0 standard drinks  . Drug use: No    Home Medications Prior to Admission medications    Medication Sig Start Date End Date Taking? Authorizing Provider  cyclobenzaprine (FLEXERIL) 10 MG tablet Take 1 tablet (10 mg total) by mouth 3 (three) times daily as needed for muscle spasms. 05/28/20  Yes Roxy HorsemanBrowning, Winston Misner, PA-C  ibuprofen (ADVIL) 800 MG tablet Take 1 tablet (800 mg total) by mouth 3 (three) times daily. 05/28/20  Yes Roxy HorsemanBrowning, Pharell Rolfson, PA-C  amLODipine (NORVASC) 10 MG tablet Take 1 tablet (10 mg total) by mouth daily. 05/22/20 05/22/21  Doran StablerNguyen, Quan, DO  aspirin 81 MG tablet Take 81 mg by mouth daily.    [provider]  atorvastatin (LIPITOR) 40 MG tablet Take 1 tablet (40 mg total) by mouth daily. 02/27/19   Santos-Sanchez, Chelsea PrimusIdalys, MD  glucose blood test strip THE PATIENT SHOULD TRY TO CHECK HER BLOOD SUGARS FOUR TIMES DAILY - FIRST THING IN THE MORNING AND BEFORE MEALS. 08/04/19 08/03/20  Glenford BayleySpeakman, Rachel, MD  insulin aspart (NOVOLOG) 100 UNIT/ML FlexPen INJECT 16 UNITS INTO THE SKIN 3 (THREE) TIMES DAILY WITH MEALS. 05/29/19 05/28/20  Santos-Sanchez,  Idalys, MD  insulin glargine (LANTUS SOLOSTAR) 100 UNIT/ML Solostar Pen Inject 40 Units into the skin 2 (two) times daily. 04/08/20 04/08/21  Eliezer Bottom, MD  insulin glargine (LANTUS SOLOSTAR) 100 UNIT/ML Solostar Pen INJECT 40 UNITS INTO THE SKIN IN THE MORNING AND 40 UNITS AT NIGHT. 04/09/20   Eliezer Bottom, MD  Insulin Pen Needle (PEN NEEDLES) 31G X 5 MM MISC Inject 1 each into the skin 4 (four) times daily -  before meals and at bedtime. 06/27/19   Santos-Sanchez, Chelsea Primus, MD  Insulin Pen Needle 31G X 5 MM MISC INJECT 1 EACH INTO THE SKIN 4 TIMES DAILY - BEFORE MEALS AND AT BEDTIME. 06/26/19 06/25/20  Santos-Sanchez, Chelsea Primus, MD  Insulin Syringe-Needle U-100 (GNP INSULIN SYRINGE) 31G X 5/16" 0.5 ML MISC Use to inject insulin daily as instructed 08/30/19   Verdene Lennert, MD  Lancets MISC Use to check blood sugar as directed 08/30/19   Verdene Lennert, MD  metFORMIN (GLUCOPHAGE) 1000 MG tablet Take 1 tablet (1,000 mg total) by mouth 2 (two)  times daily with a meal. 05/22/20   Doran Stabler, DO  methocarbamol (ROBAXIN) 500 MG tablet Take 1 tablet (500 mg total) by mouth 2 (two) times daily. 10/28/19   Muthersbaugh, Dahlia Client, PA-C    Allergies    Lisinopril, Losartan, and Hctz [hydrochlorothiazide]  Review of Systems   Review of Systems  All other systems reviewed and are negative.   Physical Exam Updated Vital Signs BP (!) 145/90   Pulse 88   Temp 98.2 F (36.8 C)   Resp 17   LMP 04/24/2020   SpO2 98%   Physical Exam Vitals and nursing note reviewed.  Constitutional:      General: She is not in acute distress.    Appearance: She is well-developed.  HENT:     Head: Normocephalic and atraumatic.  Eyes:     Conjunctiva/sclera: Conjunctivae normal.  Cardiovascular:     Rate and Rhythm: Normal rate.     Heart sounds: No murmur heard.   Pulmonary:     Effort: Pulmonary effort is normal. No respiratory distress.  Abdominal:     General: There is no distension.  Musculoskeletal:     Cervical back: Neck supple.     Comments: Range of motion and strength of bilateral upper and lower extremities is normal, normal gait Mild tenderness to palpation over the right lateral hip and buttock along with mild tenderness over the right upper trapezius  Skin:    General: Skin is warm and dry.  Neurological:     Mental Status: She is alert and oriented to person, place, and time.  Psychiatric:        Mood and Affect: Mood normal.        Behavior: Behavior normal.     ED Results / Procedures / Treatments   Labs (all labs ordered are listed, but only abnormal results are displayed) Labs Reviewed  URINALYSIS, ROUTINE W REFLEX MICROSCOPIC - Abnormal; Notable for the following components:      Result Value   Color, Urine AMBER (*)    APPearance CLOUDY (*)    Specific Gravity, Urine 1.032 (*)    Glucose, UA >=500 (*)    Protein, ur 100 (*)    Bacteria, UA RARE (*)    All other components within normal limits  POC URINE  PREG, ED    EKG None  Radiology DG Shoulder Right  Result Date: 05/27/2020 CLINICAL DATA:  Fall, pain EXAM: RIGHT SHOULDER - 2+  VIEW COMPARISON:  None. FINDINGS: There is no evidence of fracture or dislocation. There is no evidence of arthropathy or other focal bone abnormality. Soft tissues are unremarkable. IMPRESSION: No fracture or dislocation of the right shoulder. Joint spaces are preserved. Electronically Signed   By: Lauralyn Primes M.D.   On: 05/27/2020 20:35   CT Cervical Spine Wo Contrast  Result Date: 05/27/2020 CLINICAL DATA:  Spine fracture, cervical, traumatic. Neck trauma due to fall today. EXAM: CT CERVICAL SPINE WITHOUT CONTRAST TECHNIQUE: Multidetector CT imaging of the cervical spine was performed without intravenous contrast. Multiplanar CT image reconstructions were also generated. COMPARISON:  Radiographs of the cervical spine 05/29/2015. FINDINGS: Alignment: Straightening of the expected cervical lordosis. No significant spondylolisthesis. Skull base and vertebrae: The basion-dental and atlanto-dental intervals are maintained.No evidence of acute fracture to the cervical spine. 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 Additionally, there is fusion across the posterior aspect of the disc space and left-sided facet joint ankylosis at C4-C5. Soft tissues and spinal canal: No prevertebral fluid or swelling. No visible canal hematoma. Disc levels: Cervical spondylosis with mild multilevel disc space narrowing. Multilevel shallow disc bulges and uncovertebral hypertrophy. No appreciable high-grade spinal canal stenosis. No high bony neural foraminal narrowing. Upper chest: No consolidation within the imaged lung apices. No visible pneumothorax. IMPRESSION: No evidence of acute fracture to the cervical spine. 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 Additionally, there is partial fusion across the posterior aspect of the disc space, and left facet  joint ankylosis, at C4-C5. Cervical spondylosis, as described. Electronically Signed   By: Jackey Loge DO   On: 05/27/2020 18:21   DG Foot Complete Left  Result Date: 05/27/2020 CLINICAL DATA:  Fall, pain EXAM: LEFT FOOT - COMPLETE 3+ VIEW COMPARISON:  None. FINDINGS: No fracture or dislocation of the left foot. Joint spaces are well preserved. Soft tissues are unremarkable. IMPRESSION: No fracture or dislocation of the left foot. Joint spaces are well preserved. Electronically Signed   By: Lauralyn Primes M.D.   On: 05/27/2020 20:34   DG Hip Unilat W or Wo Pelvis 2-3 Views Right  Result Date: 05/27/2020 CLINICAL DATA:  Fall, pain EXAM: DG HIP (WITH OR WITHOUT PELVIS) 2-3V RIGHT COMPARISON:  None. FINDINGS: There is no evidence of hip fracture or dislocation. There is no evidence of arthropathy or other focal bone abnormality. IMPRESSION: No displaced right hip or pelvic fracture. Please note that plain radiographs are significantly insensitive for hip and pelvic fracture. Recommend CT or MRI to most sensitively assess for fracture if there is high clinical suspicion. Electronically Signed   By: Lauralyn Primes M.D.   On: 05/27/2020 20:35    Procedures Procedures   Medications Ordered in ED Medications  ibuprofen (ADVIL) tablet 800 mg (has no administration in time range)    ED Course  I have reviewed the triage vital signs and the nursing notes.  Pertinent labs & imaging results that were available during my care of the patient were reviewed by me and considered in my medical decision making (see chart for details).    MDM Rules/Calculators/A&P                          Patient here with right hip and shoulder pain after falling while at the mall today.  Plain films reveal no acute traumatic injury.  She is able to ambulate and move her extremities without difficulty.  I have a low  suspicion of occult hip fracture given her ease of ambulation.  We recommend PCP follow-up. Final Clinical  Impression(s) / ED Diagnoses Final diagnoses:  Fall, initial encounter  Acute pain of right shoulder  Hip pain    Rx / DC Orders ED Discharge Orders         Ordered    ibuprofen (ADVIL) 800 MG tablet  3 times daily        05/28/20 0100    cyclobenzaprine (FLEXERIL) 10 MG tablet  3 times daily PRN        05/28/20 0100           Roxy Horseman, PA-C 05/28/20 0103    Dione Booze, MD 05/28/20 [phone]

## 2020-05-30 ENCOUNTER — Ambulatory Visit: Payer: Self-pay

## 2020-05-30 ENCOUNTER — Encounter: Payer: Self-pay | Admitting: *Deleted

## 2020-06-05 ENCOUNTER — Other Ambulatory Visit: Payer: Self-pay

## 2020-06-05 ENCOUNTER — Encounter: Payer: Self-pay | Admitting: Internal Medicine

## 2020-06-05 ENCOUNTER — Ambulatory Visit: Payer: Self-pay | Admitting: Internal Medicine

## 2020-06-05 VITALS — BP 125/80 | HR 83 | Temp 98.6°F | Ht 60.0 in | Wt 154.8 lb

## 2020-06-05 DIAGNOSIS — E1165 Type 2 diabetes mellitus with hyperglycemia: Secondary | ICD-10-CM

## 2020-06-05 NOTE — Progress Notes (Signed)
   CC: F/u DM, HTN  HPI:  [name] is a 50 y.o. with medical history listed below presented to follow-up on chronic medical problems.  Please see problem based charting for further details.  Past Medical History:  Diagnosis Date  . Depression   . Diabetes mellitus   . Fatty liver disease, nonalcoholic   . Hypertension   . Sinusitis    Review of Systems:  As per HPI  Physical Exam:  Vitals:   06/05/20 1459  Weight: 154 lb 12.8 oz (70.2 kg)  Height: 5' (1.524 m)   Physical Exam Vitals and nursing note reviewed.  Cardiovascular:     Rate and Rhythm: Normal rate.     Heart sounds: Normal heart sounds. No murmur heard.   Pulmonary:     Breath sounds: No rales.  Musculoskeletal:     Right lower leg: No edema.     Left lower leg: No edema.     Assessment & Plan:   See Encounters Tab for problem based charting.  Patient discussed with Dr. Mayford Knife

## 2020-06-05 NOTE — Assessment & Plan Note (Signed)
#  Uncontrolled type 2 diabetes mellitus: Her last hemoglobin A1c a month ago was 11.3% from 12.3%.   Patient has her home glucose log which reads minimum 128 mg/dL, maximum 599 mg/dL.  She is above target 92% of the time.  Average fasting glucose >300mg /dL, average midday glucose >300mg /dL, average evening glucose 200s mg/dL.  She states that she is compliant to her medicines.  Patient is quite on an increased dose of insulin and I do suspect some level of dietary indiscretion.  Plan: - Continue Lantus 40 units twice daily - Continue NovoLog 16 units 3 times a day - Add Invokamet 50-1000 mg twice daily.  Patient will discontinue metformin when she picks up medication  - We will set up appointment with clinical pharmacist for disease management, education, adherence

## 2020-06-05 NOTE — Patient Instructions (Signed)
Hi [name],   Thanks for seeing me today.   My recommendations after our visit today.  1.  Please continue taking all your insulin 2.  I will send you a prescription for a new medicine called invoke a met.  Once you pick up this medication, please stop your metformin.  Address to pick up the new medicine: [address], [place], [place] [address]  Take care! Dr. Eileen Stanford  Please call the internal medicine center clinic if you have any questions or concerns, we may be able to help and keep you from a long and expensive emergency room wait. Our clinic and after hours phone number is [phone], the best time to call is Monday through Friday 9 am to 4 pm but there is always someone available 24/7 if you have an emergency. If you need medication refills please notify your pharmacy one week in advance and they will send [place] a request.   If you have not gotten the COVID vaccine, I recommend doing so:  You may get it at your local [facility] or [facility] OR To schedule an appointment for a COVID vaccine or be added to the vaccine wait list: Go to [email or web address]   OR Go to [email or web address]                  OR Call [phone]                                     OR Call [phone] and select Option 2

## 2020-06-06 ENCOUNTER — Other Ambulatory Visit: Payer: Self-pay | Admitting: Student

## 2020-06-06 ENCOUNTER — Other Ambulatory Visit (HOSPITAL_COMMUNITY): Payer: Self-pay

## 2020-06-06 ENCOUNTER — Other Ambulatory Visit: Payer: Self-pay | Admitting: Internal Medicine

## 2020-06-06 DIAGNOSIS — E1165 Type 2 diabetes mellitus with hyperglycemia: Secondary | ICD-10-CM

## 2020-06-06 MED ORDER — INVOKAMET 50-1000 MG PO TABS
1.0000 | ORAL_TABLET | Freq: Two times a day (BID) | ORAL | 2 refills | Status: DC
  Filled 2020-06-06: qty 60, 30d supply, fill #0
  Filled 2020-08-15: qty 60, 30d supply, fill #1

## 2020-06-06 NOTE — Progress Notes (Signed)
Internal Medicine Clinic Attending  Case discussed with Dr. Agyei  At the time of the visit.  We reviewed the resident's history and exam and pertinent patient test results.  I agree with the assessment, diagnosis, and plan of care documented in the resident's note.  

## 2020-06-25 ENCOUNTER — Other Ambulatory Visit: Payer: Self-pay

## 2020-06-25 ENCOUNTER — Ambulatory Visit (INDEPENDENT_AMBULATORY_CARE_PROVIDER_SITE_OTHER): Payer: Self-pay | Admitting: Pharmacist

## 2020-06-25 ENCOUNTER — Other Ambulatory Visit (HOSPITAL_COMMUNITY): Payer: Self-pay

## 2020-06-25 DIAGNOSIS — E1165 Type 2 diabetes mellitus with hyperglycemia: Secondary | ICD-10-CM

## 2020-06-25 DIAGNOSIS — Z794 Long term (current) use of insulin: Secondary | ICD-10-CM

## 2020-06-25 MED ORDER — TRULICITY 0.75 MG/0.5ML ~~LOC~~ SOAJ
0.7500 mg | SUBCUTANEOUS | 0 refills | Status: DC
  Filled 2020-06-25: qty 2, 28d supply, fill #0

## 2020-06-25 MED ORDER — INSULIN ASPART 100 UNIT/ML FLEXPEN
16.0000 [IU] | PEN_INJECTOR | Freq: Three times a day (TID) | SUBCUTANEOUS | 12 refills | Status: DC
  Filled 2020-06-25: qty 15, 30d supply, fill #0
  Filled 2020-10-03: qty 15, 30d supply, fill #1
  Filled 2020-11-29: qty 15, 30d supply, fill #2
  Filled 2021-01-16: qty 15, 30d supply, fill #3

## 2020-06-25 MED ORDER — LANTUS SOLOSTAR 100 UNIT/ML ~~LOC~~ SOPN
70.0000 [IU] | PEN_INJECTOR | Freq: Every day | SUBCUTANEOUS | 3 refills | Status: DC
  Filled 2020-06-25: qty 45, 64d supply, fill #0
  Filled 2020-07-16: qty 21, 30d supply, fill #0
  Filled 2020-08-15: qty 21, 30d supply, fill #1
  Filled 2020-09-11 – 2020-09-16 (×2): qty 21, 30d supply, fill #2
  Filled 2020-10-25: qty 21, 30d supply, fill #3
  Filled 2020-11-29: qty 21, 30d supply, fill #4
  Filled 2021-01-16: qty 21, 30d supply, fill #5
  Filled 2021-02-21: qty 21, 30d supply, fill #6
  Filled 2021-04-01: qty 21, 30d supply, fill #7
  Filled 2021-05-12: qty 12, 17d supply, fill #8

## 2020-06-25 MED ORDER — GLUCOSE BLOOD VI STRP
ORAL_STRIP | 12 refills | Status: DC
  Filled 2020-06-25: qty 100, 25d supply, fill #0
  Filled 2020-10-03: qty 100, 25d supply, fill #1
  Filled 2021-01-01: qty 100, 25d supply, fill #2
  Filled 2021-04-14: qty 100, 25d supply, fill #3
  Filled 2021-05-12: qty 100, 25d supply, fill #4

## 2020-06-25 NOTE — Progress Notes (Signed)
Subjective:    Patient ID: Robin Klein, female    DOB: [birth year], 50 y.o.   MRN: [id number]  HPI Patient is a 50 y.o. female who presents for diabetes management. She is in good spirits and presents without assistance. Intepreter was present for appointment. Patient was referred and last seen by provider, Dr. Dortha Schwalbe on 06/05/20.  Patient reports diabetes was diagnosed around 20 years ago.   Insurance coverage/medication affordability: self-pay  Current diabetes medications include: insulin aspart (Novolog) 16 units TID with meals, insulin glargine (Lantus) 40 units BID, canagliflozin-metformin (invokamet) 50-1000mg   Current hypertension medications include: amlodipine 10mg  Current hyperlipidemia medications include: atorvastatin 40mg  Patient states that She is taking her medications as prescribed. Patient denies adherence with medications. Patient states that She misses her medications one time per week, on average.  Does you feel that your medications are working for you?  yes  Have you been experiencing any side effects to the medications prescribed? no  Do you have any problems obtaining medications due to transportation or finances?  No; [facility]    Patient reported dietary habits:  Eats 3 meals/day and 1-2 snacks/day; Boluses with 3 meals/day and 0 snacks/day Breakfast: oats, nuts, chia seeds, fruit Lunch: eggs, two tortillas, salad Dinner: pasta or rice with beans + meat with veggie or salad Snacks:banana, apple, or bread Drinks:milk on occasion with lunch, water, "fresh waters (water with blended fruit and brown sugar)"  Patient-reported exercise habits: in the afternoons she walks with with family for 1-2 hours   Patient denies hypoglycemic events. Patient denies polyuria (increased urination).  Patient denies polyphagia (increased appetite).  Patient denies polydipsia (increased thirst).  Patient denies neuropathy (nerve  pain). Patient denies visual changes. Patient reports self foot exams.   Home fasting blood sugars: 223, 256, 186, 196 2 hour post-meal/random blood sugars: 179, 129  Objective:   Labs:   Lab Results  Component Value Date   HGBA1C 11.3 (A) 05/22/2020   HGBA1C 12.3 (A) 11/10/2019   HGBA1C 11.8 (A) 09/04/2019    Lab Results  Component Value Date   MICRALBCREAT 21 10/05/2019    Lipid Panel     Component Value Date/Time   CHOL 177 08/04/2019 1629   TRIG 172 (H) 08/04/2019 1629   HDL 45 08/04/2019 1629   CHOLHDL 3.9 08/04/2019 1629   CHOLHDL 3.7 10/24/2015 2225   VLDL 31 10/24/2015 2225   LDLCALC 102 (H) 08/04/2019 1629    Clinical Atherosclerotic Cardiovascular Disease (ASCVD): No  The 10-year ASCVD risk score 10/26/2015 DC Jr., et al., 2013) is: 2.8%   Values used to calculate the score:     Age: 18 years     Sex: Female     Is Non-Hispanic African American: No     Diabetic: Yes     Tobacco smoker: No     Systolic Blood Pressure: 125 mmHg     Is BP treated: Yes     HDL Cholesterol: 45 mg/dL     Total Cholesterol: 177 mg/dL    Assessment/Plan:   2014 is not controlled likely due to suboptimal medication adherence and dietary choices. Additional pharmacotherapy is warranted. Patient with a history of intolerance to Victoza but willing to try alternative GLP-1. Will initiate Trulicity 0.75mg . Patient educated on purpose, proper use and potential adverse effects of Trulicity. Utilized 52 and patient demonstrated back proper medication administration. In addition to this will also combine patient's BID dosing of Lantus to once daily in  the morning as patient reports she struggles the most with the evening dose. Will decrease dose from 40 units BID (80 units total) to 70 units QD (12.5% decrease) due to initiation of Trulicity. Following instruction patient verbalized understanding of treatment plan.    Decreased dose of basal insulin glargine (Lantus) to 70 units once  daily.  Continued  rapid insulin aspart (Novolog) 16 units TID with meals. Started GLP-1 Trulicity 0.75mg  once weekly.  Continued invokamet 50-1000mg  BID.  Extensively discussed pathophysiology of diabetes, dietary effects on blood sugar control, and recommended lifestyle interventions. Patient will adhere to dietary modifications Counseled on s/sx of and management of hypoglycemia Next A1C anticipated August 2022.   ASCVD risk - primary prevention in patient with diabetes. Last LDL is not controlled although close to goal. ASCVD risk score is not >20%. Patient currently not on statin and would not recommend at this time due to unexplained persistent elevations in AST + ALT. At[place] may benefit from repeat labs as last performed in 2020 but patient is currently self-pay. Will discuss at follow-up visit.   Continued aspirin 81 mg  Continue to hold  atorvastatin 40 mg.   Follow-up appointment 2 weeks to review sugar readings. Written patient instructions provided.  This appointment required 60 minutes of patient care (this includes precharting, chart review, review of results, and face-to-face care).  Thank you for involving pharmacy to assist in providing this patient's care.

## 2020-06-25 NOTE — Patient Instructions (Addendum)
Robin Klein, fue un placer verla hoy.  Por favor haz lo siguiente:  1. TOME LANTUS UNA VEZ AL DA 70 unidades como se indica hoy durante su cita. Si tiene alguna pregunta o si cree que ha ocurrido algo debido a este cambio, llmeme a m oa su mdico para informarnos. 2. Estoy comenzando un nuevo medicamento llamado Trulicity. Lo tomar UNA VEZ A LA SEMANA. 3. Contine controlando los niveles de Banker en casa. Es muy importante que los registre y los lleve a su prxima cita con el mdico. 4. Contine haciendo los cambios de estilo de vida que hemos discutido juntos durante nuestra visita. La dieta y el ejercicio juegan un papel importante en la mejora de los niveles de azcar en la [place]. 5. Seguimiento conmigo [employer].   Hipoglucemia o nivel bajo de azcar en la sangre:  El nivel bajo de azcar en la sangre puede ocurrir rpidamente y convertirse en una emergencia si no se trata de inmediato.  Si bien esto no debera suceder con frecuencia, puede ocurrir si se salta una comida o no come lo suficiente. Adems, si su insulina u otros medicamentos para la diabetes tienen una dosis demasiado alta, esto puede hacer que su nivel de azcar en la sangre baje.  Las seales de advertencia de niveles bajos de azcar en la sangre incluyen: 1. Sentirse tembloroso o mareado 2. Sentirse dbil o cansado 3. Hambre excesiva 4. Sentirse ansioso o molesto 5. Sudar incluso cuando no haces ejercicio  Qu hacer si tengo un nivel bajo de azcar en la sangre? Sigue la regla del 15 1. Controle su nivel de azcar en la sangre con su medidor. Si es inferior a 70, contine con el paso 2. 2. Trate con 15 gramos de carbohidratos de accin rpida que se encuentran en 3-4 tabletas de glucosa. Si no hay ninguno disponible, puede probar con caramelos duros, 1 cucharada de azcar o miel, 4 onzas de jugo de frutas o 6 onzas de refresco NORMAL. 3. Vuelva a controlar su nivel de azcar en 15 minutos. Si  todava est por debajo de 70, vuelva a hacer lo que hizo en el paso 2. Si su nivel de azcar en la sangre ha vuelto a subir, contine y coma un refrigerio o una comida pequea compuesta de carbohidratos complejos (p. ej., granos integrales) y protenas en este momento para evitar la recurrencia del nivel bajo de azcar en la sangre.  Robin Klein it was a pleasure seeing you today.   Please do the following:  TAKE LANTUS ONCE A DAY 70 units as directed today during your appointment. If you have any questions or if you believe something has occurred because of this change, call me or your doctor to let one of [place] know.  I am starting a new medication called Trulicity. You will take this ONE TIME A WEEK. Continue checking blood sugars at home. It's really important that you record these and bring these in to your next doctor's appointment.  Continue making the lifestyle changes we've discussed together during our visit. Diet and exercise play a significant role in improving your blood sugars.  Follow-up with me in two weeks.   Hypoglycemia or low blood sugar:   Low blood sugar can happen quickly and may become an emergency if not treated right away.   While this shouldn't happen often, it can be brought upon if you skip a meal or do not eat enough. Also, if your insulin or other  diabetes medications are dosed too high, this can cause your blood sugar to go to low.   Warning signs of low blood sugar include: Feeling shaky or dizzy Feeling weak or tired  Excessive hunger Feeling anxious or upset  Sweating even when you aren't exercising  What to do if I experience low blood sugar? Follow the Rule of 15 Check your blood sugar with your meter. If lower than 70, proceed to step 2.  Treat with 15 grams of fast acting carbs which is found in 3-4 glucose tablets. If none are available you can try hard candy, 1 tablespoon of sugar or honey,4 ounces of fruit juice, or 6 ounces of REGULAR soda.   Re-check your sugar in 15 minutes. If it is still below 70, do what you did in step 2 again. If your blood sugar has come back up, go ahead and eat a snack or small meal made up of complex carbs (ex. Whole grains) and protein at this time to avoid recurrence of low blood sugar.

## 2020-06-26 NOTE — Assessment & Plan Note (Signed)
T2DM is not controlled likely due to suboptimal medication adherence and dietary choices. Additional pharmacotherapy is warranted. Patient with a history of intolerance to Victoza but willing to try alternative GLP-1. Will initiate Trulicity 0.75mg . Patient educated on purpose, proper use and potential adverse effects of Trulicity. Utilized [employer] and patient demonstrated back proper medication administration. In addition to this will also combine patient's BID dosing of Lantus to once daily in the morning as patient reports she struggles the most with the evening dose. Will decrease dose from 40 units BID (80 units total) to 70 units QD (12.5% decrease) due to initiation of Trulicity. Following instruction patient verbalized understanding of treatment plan.    1. Decreased dose of basal insulin glargine (Lantus) to 70 units once daily.  2. Continued  rapid insulin aspart (Novolog) 16 units TID with meals. 3. Started GLP-1 Trulicity 0.75mg  once weekly.  4. Continued invokamet 50-1000mg  BID.  5. Extensively discussed pathophysiology of diabetes, dietary effects on blood sugar control, and recommended lifestyle interventions. 6. Patient will adhere to dietary modifications 7. Counseled on s/sx of and management of hypoglycemia 8. Next A1C anticipated August 2022.

## 2020-06-27 ENCOUNTER — Other Ambulatory Visit: Payer: Self-pay

## 2020-07-10 ENCOUNTER — Other Ambulatory Visit: Payer: Self-pay

## 2020-07-10 ENCOUNTER — Ambulatory Visit (INDEPENDENT_AMBULATORY_CARE_PROVIDER_SITE_OTHER): Payer: Self-pay | Admitting: Pharmacist

## 2020-07-10 DIAGNOSIS — E1165 Type 2 diabetes mellitus with hyperglycemia: Secondary | ICD-10-CM

## 2020-07-10 MED ORDER — TRULICITY 1.5 MG/0.5ML ~~LOC~~ SOAJ
1.5000 mg | SUBCUTANEOUS | 0 refills | Status: DC
  Filled 2020-07-10 – 2020-07-16 (×3): qty 2, 28d supply, fill #0

## 2020-07-10 NOTE — Patient Instructions (Addendum)
Robin Klein fue un placer verla hoy.  Por favor haz lo siguiente:  1. Aumente Trulicity a 1.5 mg a partir del jueves 21 de julio como se le indic hoy durante su cita. Si tiene alguna pregunta o si cree que ha ocurrido algo debido a este cambio, llmeme a m oa su mdico para informarnos. 2. Contine controlando los niveles de [place]er en casa. Es muy importante que los registre y los lleve a su prxima cita con el mdico. 3. Contine haciendo los cambios de estilo de vida que hemos discutido juntos durante nuestra visita. La dieta y el ejercicio juegan un papel importante en la mejora de los niveles de azcar en la [place]. 4. Seguimiento conmigo en cuatro semanas.  Hipoglucemia o nivel bajo de azcar en la sangre:  El nivel bajo de azcar en la sangre puede ocurrir rpidamente y convertirse en una emergencia si no se trata de inmediato.  Si bien esto no debera suceder con frecuencia, puede ocurrir si se salta una comida o no come lo suficiente. Adems, si su insulina u otros medicamentos para la diabetes tienen una dosis demasiado alta, esto puede hacer que su nivel de azcar en la sangre baje.  Las seales de advertencia de niveles bajos de azcar en la sangre incluyen: 1. Sentirse tembloroso o mareado 2. Sentirse dbil o cansado 3. Hambre excesiva 4. Sentirse ansioso o molesto 5. Sudar incluso cuando no haces ejercicio  Qu hacer si tengo un nivel bajo de azcar en la sangre? Sigue la regla del 15 1. Controle su nivel de azcar en la sangre con su medidor. Si es inferior a 70, contine con el paso 2. 2. Trate con 15 gramos de carbohidratos de accin rpida que se encuentran en 3-4 tabletas de glucosa. Si no hay ninguno disponible, puede probar con caramelos duros, 1 cucharada de azcar o miel, 4 onzas de jugo de frutas o 6 onzas de refresco NORMAL. 3. Vuelva a controlar su nivel de azcar en 15 minutos. Si todava est por debajo de 70, vuelva a hacer lo que hizo en  el paso 2. Si su nivel de azcar en la sangre ha vuelto a subir, contine y coma un refrigerio o una comida pequea compuesta de carbohidratos complejos (p. ej., granos integrales) y protenas en este momento para evitar la recurrencia del nivel bajo de azcar en la sangre.  Robin Klein it was a pleasure seeing you today.   Please do the following:  Increase Trulicity to 1.5mg  starting on Thursday July 21st as directed today during your appointment. If you have any questions or if you believe something has occurred because of this change, call me or your doctor to let one of [place] know.  Continue checking blood sugars at home. It's really important that you record these and bring these in to your next doctor's appointment.  Continue making the lifestyle changes we've discussed together during our visit. Diet and exercise play a significant role in improving your blood sugars.  Follow-up with me in four weeks.    Hypoglycemia or low blood sugar:   Low blood sugar can happen quickly and may become an emergency if not treated right away.   While this shouldn't happen often, it can be brought upon if you skip a meal or do not eat enough. Also, if your insulin or other diabetes medications are dosed too high, this can cause your blood sugar to go to low.   Warning signs of low  blood sugar include: Feeling shaky or dizzy Feeling weak or tired  Excessive hunger Feeling anxious or upset  Sweating even when you aren't exercising  What to do if I experience low blood sugar? Follow the Rule of 15 Check your blood sugar with your meter. If lower than 70, proceed to step 2.  Treat with 15 grams of fast acting carbs which is found in 3-4 glucose tablets. If none are available you can try hard candy, 1 tablespoon of sugar or honey,4 ounces of fruit juice, or 6 ounces of REGULAR soda.  Re-check your sugar in 15 minutes. If it is still below 70, do what you did in step 2 again. If your blood sugar  has come back up, go ahead and eat a snack or small meal made up of complex carbs (ex. Whole grains) and protein at this time to avoid recurrence of low blood sugar.

## 2020-07-10 NOTE — Assessment & Plan Note (Signed)
T2DM is not controlled likely due to suboptimal medication adherence and dietary choices. Patient currently tolerating 2 doses of Trulicity with no reported side effects. Patient to take third dose tomorrow. Will send in prescription with plan to increase patient to Trulicity 1.5mg  on 7/21. Discussed with patient importance of adherence to therapy. Provided patient handout of dietary choices. Following instruction patient verbalized understanding of treatment plan.    1. Continued basal insulin glargine (Lantus) to 70 units once daily.  2. Continued rapid insulin aspart (Novolog) 16 units TID with meals. 3. Continued GLP-1 Trulicity 0.75mg  once weekly. Increase to Trulicity 1.5mg  on 07/25/20.  4. Continued invokamet 50-1000mg  BID.  5. Extensively discussed pathophysiology of diabetes, dietary effects on blood sugar control, and recommended lifestyle interventions. 6. Patient will adhere to dietary modifications 7. Counseled on s/sx of and management of hypoglycemia 8. Next A1C anticipated August 2022.  9. Patient currently self-pay and handed materials for assistance. Will defer Lipid panel for now due to cost.   Follow-up appointment 4 weeks to review sugar readings. Written patient instructions provided.

## 2020-07-10 NOTE — Progress Notes (Signed)
Subjective:    Patient ID: Robin Klein, female    DOB: [birth year], 50 y.o.   MRN: [id number]  HPI Patient is a 50 y.o. female who presents for diabetes management. She is in good spirits and presents without assistance. Utilized interpreter [phone]. Patient was referred and last seen by provider, Dr. Dortha Schwalbe on 06/05/20. Last seen in pharmacy clinic on 06/25/20.  Patient reports that she has been doing "great" and that her blood glucose has been a little bit lower. She does report an elevated blood glucose this morning in the 200's and mentions it was likely because she ate two tortillas late last night before bedtime.  Patient reports diabetes was diagnosed around 20 years ago.   Insurance coverage/medication affordability: self-pay  Current diabetes medications include: insulin aspart (Novolog) 16 units TID with meals, insulin glargine (Lantus) 70 units every morning, canagliflozin-metformin (invokamet) 50-1000mg , Trulicity 0.75mg  once weekly on Thursdays (has taken 2 doses thus far) Current hypertension medications include: amlodipine 10mg  Current hyperlipidemia medications include: atorvastatin 40mg  Patient states that She is taking her medications as prescribed. Patient denies adherence with medications. Patient states that she has missed a couple doses of her Novolog.   Does you feel that your medications are working for you?  yes  Have you been experiencing any side effects to the medications prescribed? no  Do you have any problems obtaining medications due to transportation or finances?  No; [facility]   Patient reported dietary habits:  Eats 3 meals/day and 1-2 snacks/day; Boluses with 3 meals/day and 0 snacks/day Breakfast: oats, nuts, chia seeds, fruit Lunch: eggs, two tortillas, salad Dinner: pasta or rice with beans + meat with veggie or salad Snacks:banana, apple, or bread Drinks:milk on occasion with lunch, water, "fresh waters (water with  blended fruit and brown sugar)"  Patient-reported exercise habits: in the afternoons she walks with with family for 1-2 hours   Patient denies hypoglycemic events. Patient denies polyuria (increased urination).  Patient denies polyphagia (increased appetite).  Patient denies polydipsia (increased thirst).  Patient denies neuropathy (nerve pain). Patient denies visual changes. Patient reports self foot exams.   Home fasting blood sugars: 238 (this morning), 156, 194, 251, 208, 147, 195 2 hour post-meal/random blood sugars: 124, 201, 167  Objective:   Physical Exam Neurological:     Mental Status: She is alert and oriented to person, place, and time.    Review of Systems  Gastrointestinal:  Negative for abdominal pain, nausea and vomiting.    Labs:   Lab Results  Component Value Date   HGBA1C 11.3 (A) 05/22/2020   HGBA1C 12.3 (A) 11/10/2019   HGBA1C 11.8 (A) 09/04/2019    Lab Results  Component Value Date   MICRALBCREAT 21 10/05/2019    Lipid Panel     Component Value Date/Time   CHOL 177 08/04/2019 1629   TRIG 172 (H) 08/04/2019 1629   HDL 45 08/04/2019 1629   CHOLHDL 3.9 08/04/2019 1629   CHOLHDL 3.7 10/24/2015 2225   VLDL 31 10/24/2015 2225   LDLCALC 102 (H) 08/04/2019 1629    Clinical Atherosclerotic Cardiovascular Disease (ASCVD): No  The 10-year ASCVD risk score 10/26/2015 DC Jr., et al., 2013) is: 2.8%   Values used to calculate the score:     Age: 33 years     Sex: Female     Is Non-Hispanic African American: No     Diabetic: Yes     Tobacco smoker: No     Systolic  Blood Pressure: 125 mmHg     Is BP treated: Yes     HDL Cholesterol: 45 mg/dL     Total Cholesterol: 177 mg/dL    Assessment/Plan:   [id number] is not controlled likely due to suboptimal medication adherence and dietary choices. Patient currently tolerating 2 doses of Trulicity with no reported side effects. Patient to take third dose tomorrow. Will send in prescription with plan to increase  patient to Trulicity 1.5mg  on 7/21. Discussed with patient importance of adherence to therapy. Provided patient handout of dietary choices. Following instruction patient verbalized understanding of treatment plan.    Continued basal insulin glargine (Lantus) to 70 units once daily.  Continued rapid insulin aspart (Novolog) 16 units TID with meals. Continued GLP-1 Trulicity 0.75mg  once weekly. Increase to Trulicity 1.5mg  on 07/25/20.  Continued invokamet 50-1000mg  BID.  Extensively discussed pathophysiology of diabetes, dietary effects on blood sugar control, and recommended lifestyle interventions. Patient will adhere to dietary modifications Counseled on s/sx of and management of hypoglycemia Next A1C anticipated August 2022.  Patient currently self-pay and handed materials for assistance. Will defer Lipid panel for now due to cost.   Follow-up appointment 4 weeks to review sugar readings. Written patient instructions provided.  This appointment required 60 minutes of patient care (this includes precharting, chart review, review of results, and face-to-face care).  Thank you for involving pharmacy to assist in providing this patient's care.

## 2020-07-16 ENCOUNTER — Other Ambulatory Visit: Payer: Self-pay

## 2020-07-16 ENCOUNTER — Other Ambulatory Visit: Payer: Self-pay | Admitting: Internal Medicine

## 2020-07-16 ENCOUNTER — Other Ambulatory Visit (HOSPITAL_COMMUNITY): Payer: Self-pay

## 2020-07-16 MED ORDER — UNIFINE PENTIPS 31G X 5 MM MISC
11 refills | Status: DC
  Filled 2020-07-16: qty 100, 25d supply, fill #0
  Filled 2020-09-11: qty 100, 25d supply, fill #1
  Filled 2020-10-03: qty 100, 25d supply, fill #2
  Filled 2021-01-01: qty 100, 25d supply, fill #3
  Filled 2021-02-07: qty 100, 25d supply, fill #4
  Filled 2021-06-30: qty 100, 25d supply, fill #5

## 2020-07-17 ENCOUNTER — Other Ambulatory Visit (HOSPITAL_COMMUNITY): Payer: Self-pay

## 2020-07-17 ENCOUNTER — Other Ambulatory Visit: Payer: Self-pay

## 2020-07-19 ENCOUNTER — Other Ambulatory Visit: Payer: Self-pay

## 2020-07-22 NOTE — Progress Notes (Signed)
Received notification from [facility] [employer] regarding approval for TRULICITY 3MG /0.5ML. Patient assistance approved from 07/17/20 to 07/17/21.  MEDICATION WILL SHIP TO PATIENTS HOME  Phone: [phone]

## 2020-08-08 ENCOUNTER — Other Ambulatory Visit: Payer: Self-pay

## 2020-08-08 ENCOUNTER — Ambulatory Visit (INDEPENDENT_AMBULATORY_CARE_PROVIDER_SITE_OTHER): Payer: Self-pay | Admitting: Pharmacist

## 2020-08-08 DIAGNOSIS — E1165 Type 2 diabetes mellitus with hyperglycemia: Secondary | ICD-10-CM

## 2020-08-08 NOTE — Progress Notes (Signed)
Subjective:    Patient ID: Robin Klein, female    DOB: 1970-01-08, 50 y.o.   MRN: [id number]  HPI Patient is a 50 y.o. female who presents for diabetes management. She is in good spirits and presents without assistance with daughter. Utilized interpreter [phone]. Patient was referred and last seen by provider, Dr. Dortha Schwalbe on 06/05/20.  Last seen in pharmacy clinic on 07/10/20.   Patient reports "everything has been fine." Reports her blood glucose has ranged from 90-285. Most blood glucose readings in 100's. Believes the readings in the 200's were because of eating more than she is supposed to at night. She does report that she feels her appetite has lessened since beginning Trulicity.   Patient reports diabetes was diagnosed around 20 years ago.   Insurance coverage/medication affordability: self-pay  Current diabetes medications include: insulin aspart (Novolog) 16 units TID with meals, insulin glargine (Lantus) 70 units every morning, canagliflozin-metformin (invokamet) 50-1000mg  BID, Trulicity 1.5mg  once weekly on Thursdays (completed 3 doses)  Current hypertension medications include: amlodipine 10mg  Current hyperlipidemia medications include: atorvastatin 40mg  Patient states that She is taking her medications as prescribed.   Does you feel that your medications are working for you?  yes  Have you been experiencing any side effects to the medications prescribed? no  Do you have any problems obtaining medications due to transportation or finances?  No; [facility]     Patient denies hypoglycemic events. Patient denies polyuria (increased urination).  Patient denies polyphagia (increased appetite).  Patient denies polydipsia (increased thirst).  Patient denies neuropathy (nerve pain). Patient denies visual changes. Patient reports self foot exams.   Objective:   Physical Exam Neurological:     Mental Status: She is alert and oriented to person,  place, and time.    Review of Systems  Gastrointestinal:  Negative for abdominal pain, nausea and vomiting.    Labs:   Lab Results  Component Value Date   HGBA1C 11.3 (A) 05/22/2020   HGBA1C 12.3 (A) 11/10/2019   HGBA1C 11.8 (A) 09/04/2019    Lab Results  Component Value Date   MICRALBCREAT 21 10/05/2019    Lipid Panel     Component Value Date/Time   CHOL 177 08/04/2019 1629   TRIG 172 (H) 08/04/2019 1629   HDL 45 08/04/2019 1629   CHOLHDL 3.9 08/04/2019 1629   CHOLHDL 3.7 10/24/2015 2225   VLDL 31 10/24/2015 2225   LDLCALC 102 (H) 08/04/2019 1629    Clinical Atherosclerotic Cardiovascular Disease (ASCVD): No  The 10-year ASCVD risk score 10/26/2015 DC Jr., et al., 2013) is: 2.8%   Values used to calculate the score:     Age: 3 years     Sex: Female     Is Non-Hispanic African American: No     Diabetic: Yes     Tobacco smoker: No     Systolic Blood Pressure: 125 mmHg     Is BP treated: Yes     HDL Cholesterol: 45 mg/dL     Total Cholesterol: 177 mg/dL    Assessment/Plan:   2014 is not controlled likely due to suboptimal dietary choices. Will plan to continue to titrate Trulicity to max dose of 4.5mg  if patient continues to tolerate. Instructed patient to complete 4th dose of Trulicity 1.5mg  next Thursday and titrate up to 3mg  the following week. In future if patient continues to struggle with glycemic control will titrate up insulin. Following instruction patient verbalized understanding of treatment plan.  Continued basal insulin glargine (Lantus) to 70 units once daily.  Continued rapid insulin aspart (Novolog) 16 units TID with meals. Continued GLP-1 Trulicity 1.5 mg once weekly. Increase to Trulicity 3mg  on 08/22/20. Continued invokamet 50-1000mg  BID.  Extensively discussed pathophysiology of diabetes, dietary effects on blood sugar control, and recommended lifestyle interventions. Patient will adhere to dietary modifications Counseled on s/sx of and management  of hypoglycemia Next A1C anticipated at next appointment  Follow-up appointment 4 weeks to review sugar readings. Written patient instructions provided.  This appointment required 30 minutes of direct patient care.  Thank you for involving pharmacy to assist in providing this patient's care.

## 2020-08-08 NOTE — Patient Instructions (Addendum)
Robin Klein fue un placer verla hoy.  Por favor haz lo siguiente:  1. Aumente Trulicity a 3 mg en [employer] se le indic hoy durante su cita. Si tiene alguna pregunta o si cree que ha ocurrido algo debido a este cambio, llmeme a m oa su mdico para informarnos. 2. Contine controlando los niveles de [place] en casa. Es muy importante que los registre y los lleve a su prxima cita con el mdico. 3. Contine haciendo los cambios de estilo de vida que hemos discutido juntos durante nuestra visita. La dieta y el ejercicio juegan un papel importante en la mejora de los niveles de azcar en la [place]. 4. Seguimiento conmigo en tres semanas  Hipoglucemia o nivel bajo de azcar en la sangre:  El nivel bajo de azcar en la sangre puede ocurrir rpidamente y convertirse en una emergencia si no se trata de inmediato.  Si bien esto no debera suceder con frecuencia, puede ocurrir si se salta una comida o no come lo suficiente. Adems, si su insulina u otros medicamentos para la diabetes tienen una dosis demasiado alta, esto puede hacer que su nivel de azcar en la sangre baje.  Las seales de advertencia de niveles bajos de azcar en la sangre incluyen: 1. Sentirse tembloroso o mareado 2. Sentirse dbil o cansado 3. Hambre excesiva 4. Sentirse ansioso o molesto 5. Sudar incluso cuando no haces ejercicio  Qu hacer si tengo un nivel bajo de azcar en la sangre? Sigue la regla del 15 1. Controle su nivel de azcar en la sangre con su medidor. Si es inferior a 70, contine con el paso 2. 2. Trate con 15 gramos de carbohidratos de accin rpida que se encuentran en 3-4 tabletas de glucosa. Si no hay ninguno disponible, puede probar con caramelos duros, 1 cucharada de azcar o miel, 4 onzas de jugo de frutas o 6 onzas de refresco NORMAL. 3. Vuelva a controlar su nivel de azcar en 15 minutos. Si todava est por debajo de 70, vuelva a hacer lo que hizo en el paso 2. Si su nivel de  azcar en la sangre ha vuelto a subir, contine y coma un refrigerio o una comida pequea compuesta de carbohidratos complejos (p. ej., granos integrales) y protenas en este momento para evitar la recurrencia del nivel bajo de azcar en la sangre.   [name] it was a pleasure seeing you today.   Please do the following:  Increase Trulicity to 3mg  in two weeks as directed today during your appointment. If you have any questions or if you believe something has occurred because of this change, call me or your doctor to let one of know.  Continue checking blood sugars at home. It's really important that you record these and bring these in to your next doctor's appointment.  Continue making the lifestyle changes we've discussed together during our visit. Diet and exercise play a significant role in improving your blood sugars.  Follow-up with me in three weeks  Hypoglycemia or low blood sugar:   Low blood sugar can happen quickly and may become an emergency if not treated right away.   While this shouldn't happen often, it can be brought upon if you skip a meal or do not eat enough. Also, if your insulin or other diabetes medications are dosed too high, this can cause your blood sugar to go to low.   Warning signs of low blood sugar include: Feeling shaky or dizzy Feeling weak  or tired  Excessive hunger Feeling anxious or upset  Sweating even when you aren't exercising  What to do if I experience low blood sugar? Follow the Rule of 15 Check your blood sugar with your meter. If lower than 70, proceed to step 2.  Treat with 15 grams of fast acting carbs which is found in 3-4 glucose tablets. If none are available you can try hard candy, 1 tablespoon of sugar or honey,4 ounces of fruit juice, or 6 ounces of REGULAR soda.  Re-check your sugar in 15 minutes. If it is still below 70, do what you did in step 2 again. If your blood sugar has come back up, go ahead and eat a snack or small meal  made up of complex carbs (ex. Whole grains) and protein at this time to avoid recurrence of low blood sugar.

## 2020-08-09 NOTE — Assessment & Plan Note (Signed)
T2DM is not controlled likely due to suboptimal dietary choices. Will plan to continue to titrate Trulicity to max dose of 4.5mg  if patient continues to tolerate. Instructed patient to complete 4th dose of Trulicity 1.5mg  next Thursday and titrate up to 3mg  the following week. In future if patient continues to struggle with glycemic control will titrate up insulin. Following instruction patient verbalized understanding of treatment plan.    1. Continued basal insulin glargine (Lantus) to 70 units once daily.  2. Continued rapid insulin aspart (Novolog) 16 units TID with meals. 3. Continued GLP-1 Trulicity 1.5 mg once weekly. Increase to Trulicity 3mg  on 08/22/20. 4. Continued invokamet 50-1000mg  BID.  5. Extensively discussed pathophysiology of diabetes, dietary effects on blood sugar control, and recommended lifestyle interventions. 6. Patient will adhere to dietary modifications 7. Counseled on s/sx of and management of hypoglycemia 8. Next A1C anticipated at next appointment  Follow-up appointment 4 weeks to review sugar readings. Written patient instructions provided.

## 2020-08-15 ENCOUNTER — Other Ambulatory Visit (HOSPITAL_COMMUNITY): Payer: Self-pay

## 2020-08-20 ENCOUNTER — Other Ambulatory Visit (HOSPITAL_COMMUNITY): Payer: Self-pay

## 2020-08-22 ENCOUNTER — Other Ambulatory Visit (HOSPITAL_COMMUNITY): Payer: Self-pay

## 2020-08-27 ENCOUNTER — Other Ambulatory Visit (HOSPITAL_COMMUNITY): Payer: Self-pay

## 2020-08-29 ENCOUNTER — Ambulatory Visit (INDEPENDENT_AMBULATORY_CARE_PROVIDER_SITE_OTHER): Payer: Self-pay | Admitting: Pharmacist

## 2020-08-29 ENCOUNTER — Emergency Department (HOSPITAL_COMMUNITY): Payer: Self-pay

## 2020-08-29 ENCOUNTER — Emergency Department (HOSPITAL_COMMUNITY)
Admission: EM | Admit: 2020-08-29 | Discharge: 2020-08-29 | Disposition: A | Discharge status: Home / Self Care | Payer: Self-pay | Attending: Emergency Medicine | Admitting: Emergency Medicine

## 2020-08-29 ENCOUNTER — Encounter: Payer: Self-pay | Admitting: Internal Medicine

## 2020-08-29 ENCOUNTER — Other Ambulatory Visit: Payer: Self-pay

## 2020-08-29 DIAGNOSIS — R079 Chest pain, unspecified: Secondary | ICD-10-CM | POA: Insufficient documentation

## 2020-08-29 DIAGNOSIS — Z79899 Other long term (current) drug therapy: Secondary | ICD-10-CM | POA: Insufficient documentation

## 2020-08-29 DIAGNOSIS — I10 Essential (primary) hypertension: Secondary | ICD-10-CM | POA: Insufficient documentation

## 2020-08-29 DIAGNOSIS — Z794 Long term (current) use of insulin: Secondary | ICD-10-CM | POA: Insufficient documentation

## 2020-08-29 DIAGNOSIS — E119 Type 2 diabetes mellitus without complications: Secondary | ICD-10-CM | POA: Insufficient documentation

## 2020-08-29 DIAGNOSIS — Z7982 Long term (current) use of aspirin: Secondary | ICD-10-CM | POA: Insufficient documentation

## 2020-08-29 LAB — BASIC METABOLIC PANEL
Anion gap: 9 (ref 5–15)
BUN: 9 mg/dL (ref 6–20)
CO2: 22 mmol/L (ref 22–32)
Calcium: 8.8 mg/dL — ABNORMAL LOW (ref 8.9–10.3)
Chloride: 107 mmol/L (ref 98–111)
Creatinine, Ser: 0.59 mg/dL (ref 0.44–1.00)
GFR, Estimated: >60 mL/min (ref >60)
Glucose, Bld: 169 mg/dL — ABNORMAL HIGH (ref 70–99)
Potassium: 3.6 mmol/L (ref 3.5–5.1)
Sodium: 138 mmol/L (ref 135–145)

## 2020-08-29 LAB — CBC
HCT: 40.7 % (ref 36.0–46.0)
Hemoglobin: 13.8 g/dL (ref 12.0–15.0)
MCH: 28.3 pg (ref 26.0–34.0)
MCHC: 33.9 g/dL (ref 30.0–36.0)
MCV: 83.4 fL (ref 80.0–100.0)
Platelets: 317 10*3/uL (ref 150–400)
RBC: 4.88 MIL/uL (ref 3.87–5.11)
RDW: 13.2 % (ref 11.5–15.5)
WBC: 8.1 10*3/uL (ref 4.0–10.5)
nRBC: 0 % (ref 0.0–0.2)

## 2020-08-29 LAB — I-STAT BETA HCG BLOOD, ED (MC, WL, AP ONLY): I-stat hCG, quantitative: <5 m[IU]/mL (ref <5)

## 2020-08-29 LAB — TROPONIN I (HIGH SENSITIVITY)
Troponin I (High Sensitivity): 6 ng/L (ref <18)
Troponin I (High Sensitivity): 7 ng/L (ref <18)

## 2020-08-29 MED ORDER — LIDOCAINE VISCOUS HCL 2 % MT SOLN
15.0000 mL | Freq: Once | OROMUCOSAL | Status: AC
  Administered 2020-08-29: 15 mL via ORAL
  Filled 2020-08-29: qty 15

## 2020-08-29 MED ORDER — ALUM & MAG HYDROXIDE-SIMETH 200-200-20 MG/5ML PO SUSP
30.0000 mL | Freq: Once | ORAL | Status: AC
  Administered 2020-08-29: 30 mL via ORAL
  Filled 2020-08-29: qty 30

## 2020-08-29 NOTE — ED Triage Notes (Signed)
Patient complains of left sided chest pain with radiation to left jaw that started two days ago and got worse yesterday. Patient alert, oriented, and in no apparent distress at this time.

## 2020-08-29 NOTE — Progress Notes (Signed)
Patient transported via wheelchair.

## 2020-08-29 NOTE — Discharge Instructions (Addendum)
We cannot ruleYour work-up today was reassuring, no signs of emergent disease.  Please call the cardiology office and set up an appointment for additional evaluation.  Although we did not see any signs of a heart attack, that there is not another cardiac cause for her symptoms.  If things change or worsen please turn back to the ED for additional evaluation.  No podemos descartar Su evaluacin de hoy fue tranquilizadora, sin signos de enfermedad emergente. Llame a la oficina de cardiologa y programe una cita para una evaluacin adicional. Aunque no vimos signos de un ataque al corazn, no hay otra causa cardaca para sus sntomas. Si las cosas cambian o [place], [place] al ED para Neomia Dear evaluacin adicional.

## 2020-08-29 NOTE — Progress Notes (Signed)
   Subjective:    Patient ID: Robin Klein, female    DOB: 1970/06/06, 50 y.o.   MRN: [id number]  HPI Patient is a 50 y.o. female who presents for diabetes management. Patient was referred and last seen by provider, Dr. Dortha Schwalbe on 06/05/20. Utilized interpreter [phone].  When questioned on how patient has been doing she states she has had chest pain off and on for a week and increased her aspirin 81mg  from one tablet daily to 2-3 tablets daily to try to help. She mentions some improvement in chest pain. Also states she has had shortness of breath almost daily since experiencing the chest pain. She notes additional pain that starts in her neck and travels down into her right arm. She mentions her arm went numb yesterday and she vomited shortly after.  Discussed with Dr. and recommended patient present to ED. Patient agreeable. Heide Spark, RN brought patient in wheelchair up to ED.

## 2020-08-29 NOTE — ED Provider Notes (Signed)
[facility] EMERGENCY DEPARTMENT Provider Note   CSN: [id number] Arrival date & time: 08/29/20  1430     History Chief Complaint  Patient presents with   Chest Pain    Robin Klein is a 50 y.o. female.  HPI  Patient presents with chest pain  x3 days.  The pain is transient, left-sided with radiation to the left side of her jaw.  There is no associated nausea or vomiting.  The pain is alleviated with rest, exacerbated with exertion.  She has had this pain in the past, unsure but she did not seek medical attention for it.  She has tried ibuprofen and aspirin with minimal relief. She does have a medical history of high blood pressure and hyperlipedmia.  No previous MIs, patient is not a smoker.  Spanish interpreting service was used to obtain history and during the physical exam.  Past Medical History:  Diagnosis Date   Depression    Diabetes mellitus    Fatty liver disease, nonalcoholic    Hypertension    Sinusitis     Patient Active Problem List   Diagnosis Date Noted   Health care maintenance 06/27/2014   Low back pain 07/24/2010   NAFLD (nonalcoholic fatty liver disease) 93/26/7124   Hyperlipidemia 01/03/2007   Essential hypertension, benign 12/23/2005   Diabetes mellitus type II, uncontrolled (HCC) 01/05/1997    Past Surgical History:  Procedure Laterality Date   CESAREAN SECTION       OB History     Gravida  4   Para  3   Term  3   Preterm  0   AB  1   Living  3      SAB  1   IAB      Ectopic  0   Multiple  0   Live Births              Family History  Problem Relation Age of Onset   Diabetes Mother    CAD Mother        Died MI 17   Diabetes Father    CAD Father        Died MI 31   Cancer Paternal Grandmother     Social History   Tobacco Use   Smoking status: Never   Smokeless tobacco: Never  Substance Use Topics   Alcohol use: No    Alcohol/week: 0.0 standard drinks   Drug use: No     Home Medications Prior to Admission medications   Medication Sig Start Date End Date Taking? Authorizing Provider  amLODipine (NORVASC) 10 MG tablet Take 1 tablet (10 mg total) by mouth daily. 05/22/20 05/22/21  Doran Stabler, DO  aspirin 81 MG tablet Take 81 mg by mouth daily.    [provider]  Canagliflozin-metFORMIN HCl (INVOKAMET) 50-1000 MG TABS Take 1 tablet by mouth 2 (two) times daily. 06/06/20   Evlyn Kanner, MD  Dulaglutide (TRULICITY) 1.5 MG/0.5ML SOPN Inject 1.5 mg into the skin once a week. Start on Thursday 07/25/20. 07/10/20   Inez Catalina, MD  Dulaglutide (TRULICITY) 3 MG/0.5ML SOPN Inject 3 mg into the skin once a week. Patient not to take until finish 1.5mg     [provider]  glucose blood test strip CHECK  BLOOD SUGARS FOUR TIMES DAILY - FIRST THING IN THE MORNING AND BEFORE MEALS. 06/25/20 06/25/21  Miguel Aschoff, MD  ibuprofen (ADVIL) 800 MG tablet Take 1 tablet (800 mg total) by mouth  3 (three) times daily. Patient not taking: Reported on 08/08/2020 05/28/20   Roxy Horseman, PA-C  insulin aspart (NOVOLOG) 100 UNIT/ML FlexPen Inject 16 Units into the skin 3 (three) times daily with meals. 06/25/20   Miguel Aschoff, MD  insulin glargine (LANTUS SOLOSTAR) 100 UNIT/ML Solostar Pen Inject 70 Units into the skin daily. 06/25/20   Miguel Aschoff, MD  Insulin Pen Needle (PEN NEEDLES) 31G X 5 MM MISC Inject 1 each into the skin 4 (four) times daily -  before meals and at bedtime. 06/27/19   Burna Cash, MD  Insulin Pen Needle (UNIFINE PENTIPS) 31G X 5 MM MISC INJECT 1 EACH INTO THE SKIN 4 TIMES DAILY - BEFORE MEALS AND AT BEDTIME. 07/16/20 07/16/21  Verdene Lennert, MD  Insulin Syringe-Needle U-100 (GNP INSULIN SYRINGE) 31G X 5/16" 0.5 ML MISC Use to inject insulin daily as instructed 08/30/19   Verdene Lennert, MD  Lancets MISC Use to check blood sugar as directed 08/30/19   Verdene Lennert, MD    Allergies    Lisinopril, Losartan, and  Hctz [hydrochlorothiazide]  Review of Systems   Review of Systems  Constitutional:  Negative for fatigue and fever.  Respiratory:  Positive for shortness of breath. Negative for cough.   Cardiovascular:  Positive for chest pain.  Gastrointestinal:  Negative for nausea and vomiting.  Musculoskeletal:  Positive for neck pain.  Neurological:  Negative for dizziness, syncope and weakness.   Physical Exam Updated Vital Signs BP 120/77 (BP Location: Left Arm)   Pulse 79   Temp 98.3 F (36.8 C) (Oral)   Resp 14   SpO2 100%   Physical Exam Vitals and nursing note reviewed. Exam conducted with a chaperone present.  Constitutional:      Appearance: Normal appearance. She is obese.  HENT:     Head: Normocephalic and atraumatic.  Eyes:     General: No scleral icterus.       Right eye: No discharge.        Left eye: No discharge.     Extraocular Movements: Extraocular movements intact.     Pupils: Pupils are equal, round, and reactive to light.  Cardiovascular:     Rate and Rhythm: Normal rate and regular rhythm.     Pulses: Normal pulses.     Heart sounds: Normal heart sounds. No murmur heard.   No friction rub. No gallop.     Comments: DP and PT are 2+ bilaterally Pulmonary:     Effort: Pulmonary effort is normal. No respiratory distress.     Breath sounds: Normal breath sounds.  Abdominal:     General: Abdomen is flat. Bowel sounds are normal. There is no distension.     Palpations: Abdomen is soft.     Tenderness: There is no abdominal tenderness.  Musculoskeletal:     Right lower leg: No edema.     Left lower leg: No edema.     Comments: Legs are roughly symmetric, no unilateral leg swelling, no pitting edema.  Skin:    General: Skin is warm and dry.     Coloration: Skin is not jaundiced.  Neurological:     Mental Status: She is alert. Mental status is at baseline.     Coordination: Coordination normal.   ED Results / Procedures / Treatments   Labs (all labs ordered  are listed, but only abnormal results are displayed) Labs Reviewed  BASIC METABOLIC PANEL - Abnormal; Notable for the following components:      Result  Value   Glucose, Bld 169 (*)    Calcium 8.8 (*)    All other components within normal limits  CBC  I-STAT BETA HCG BLOOD, ED ([facility], [facility], [facility] ONLY)  TROPONIN I (HIGH SENSITIVITY)  TROPONIN I (HIGH SENSITIVITY)    EKG None  Radiology DG Chest 2 View  Result Date: 08/29/2020 CLINICAL DATA:  Chest pain, diabetes mellitus, hypertension EXAM: CHEST - 2 VIEW COMPARISON:  10/24/2015 FINDINGS: Normal heart size, mediastinal contours, and pulmonary vascularity. Minimal subsegmental atelectasis RIGHT base. Remaining lungs clear. No acute infiltrate, pleural effusion, or pneumothorax. Bones demineralized. IMPRESSION: Minimal subsegmental atelectasis RIGHT lung base. Electronically Signed   By: Ulyses Southward M.D.   On: 08/29/2020 16:09    Procedures Procedures   Medications Ordered in ED Medications - No data to display  ED Course  I have reviewed the triage vital signs and the nursing notes.  Pertinent labs & imaging results that were available during my care of the patient were reviewed by me and considered in my medical decision making (see chart for details).  Clinical Course as of 08/29/20 2311  Thu Aug 29, 2020  1713 CBC No leukocytosis, no anemia [HS]  1713 Basic metabolic panel(!) No electrolyte derangement, no AKI [HS]  1713 I-Stat beta hCG blood, ED Doubt ectopic pregnancy [HS]  1713 Troponin I (High Sensitivity) Initial troponin low, reassuring that this is unlikely ACS.  Will check second troponin for reassurance. [HS]  1713 ED EKG No ST elevations or depressions, patient is not tachycardic. [HS]    Clinical Course User Index [HS] Theron Arista, PA-C   MDM Rules/Calculators/A&P                          Patient hear score is 4.  Patient has stable vitals, she has not tachypneic or in any signs respiratory distress.  She is  satting in [place] on room air, no accessory muscle use.  S1-S2 without any murmurs gallops or rubs.  Differential includes ACS, GERD, pneumonia, pericarditis, esophageal spasm, MSK pain, I doubt dissection given her age, lack of smoking, lack of pain that radiates to the back.  I doubt PE given she has not been hypoxic or tachycardic.   Please see ED course for interpretation of labs and imaging as they pertain to the differential.  Negative delta troponins, EKG without any ST elevation or depression.  No leukocytosis, no findings consistent with pneumonia on chest x-ray.  Patient vitals are stable, she is resting in no acute distress. On reevaluation patient reports improvement of her symptoms.  We reviewed the results of the work-up today, although we do not have an identifiable reason for her chest wall tenderness I think she is appropriate to follow-up outpatient with cardiology.  Return precautions were discussed and agreed upon.  Discussed HPI, physical exam and plan of care for this patient with attending [facility]. The attending physician agrees with plan of care.   Final Clinical Impression(s) / ED Diagnoses Final diagnoses:  None    Rx / DC Orders ED Discharge Orders     None        Theron Arista, Cordelia Poche 08/29/20 2313    Cheryll Cockayne, MD 08/29/20 2356

## 2020-08-29 NOTE — ED Provider Notes (Signed)
Emergency Medicine Provider Triage Evaluation Note  Lavone Barrientes , a 50 y.o. female  was evaluated in triage.  Pt complains of cp.  Review of Systems  Positive: Cp, sob, jaw pain Negative: Fever, chills, cough  Physical Exam  BP 136/83 (BP Location: Right Arm)   Pulse 79   Temp 99 F (37.2 C) (Oral)   Resp 16   SpO2 100%  Gen:   Awake, no distress   Resp:  Normal effort  MSK:   Moves extremities without difficulty  Other:    Medical Decision Making  Medically screening exam initiated at 3:19 PM.  Appropriate orders placed.  Halimah Bewick was informed that the remainder of the evaluation will be completed by another provider, this initial triage assessment does not replace that evaluation, and the importance of remaining in the ED until their evaluation is complete.  Pt report intermittent L sided chest pain occasionallly radiates to jaw and accompany with sob x 2-3 days.  No active CP.  ASA and tylenol at home helps.  No hx of cardiac disease   Fayrene Helper, Cordelia Poche 08/29/20 1520    Pricilla Loveless, MD 08/29/20 [phone]

## 2020-09-11 ENCOUNTER — Other Ambulatory Visit (HOSPITAL_COMMUNITY): Payer: Self-pay

## 2020-09-16 ENCOUNTER — Encounter: Payer: Self-pay | Admitting: Pharmacist

## 2020-09-16 ENCOUNTER — Other Ambulatory Visit (HOSPITAL_COMMUNITY): Payer: Self-pay

## 2020-09-19 ENCOUNTER — Ambulatory Visit (INDEPENDENT_AMBULATORY_CARE_PROVIDER_SITE_OTHER): Payer: Self-pay | Admitting: Pharmacist

## 2020-09-19 DIAGNOSIS — E1165 Type 2 diabetes mellitus with hyperglycemia: Secondary | ICD-10-CM

## 2020-09-19 LAB — POCT GLYCOSYLATED HEMOGLOBIN (HGB A1C): Hemoglobin A1C: 8.4 % — AB (ref 4.0–5.6)

## 2020-09-19 NOTE — Progress Notes (Signed)
Subjective:    Patient ID: Robin Klein, female    DOB: September 21, 1970, 50 y.o.   MRN: [id number]  HPI Patient is a 50 y.o. female who presents for diabetes management. She is in good spirits and presents without assistance. Utilized interpreter [phone]. Patient was referred and last seen by provider, Dr. Dortha Schwalbe on 06/05/20.  Last seen in pharmacy clinic on 08/29/20 but had chest pain and sent pt to ED so appt rescheduled to today.   It has been okay but yesterday and today it has been a little bit high. Patient reports she has not done anything different but had some tea with honey which she says may have caused it.  Patient reports diabetes was diagnosed around 20 years ago.   Insurance coverage/medication affordability: self-pay  Current diabetes medications include: insulin aspart (Novolog) 16 units TID with meals, insulin glargine (Lantus) 70 units every morning, canagliflozin-metformin (invokamet) 50-1000mg  BID, Trulicity 1.5mg  once weekly on Thursdays (completed 3 doses)  Current hypertension medications include: amlodipine 10mg  Current hyperlipidemia medications include: atorvastatin 40mg  Patient states that She is taking her medications as prescribed.   Does you feel that your medications are working for you?  yes  Have you been experiencing any side effects to the medications prescribed? no  Do you have any problems obtaining medications due to transportation or finances?  No; [facility]   Patient reported dietary habits:  Eats 3 meals/day and 1-2 snacks/day; Boluses with 3 meals/day and 0 snacks/day Breakfast: tomatoes, onion, two eggs, wheat bread, tea (without sugar) Lunch: two tacos with avocado and protein Dinner: milk with piece of bread or cereal Snacks: 1/2 banana or granola bars Drinks: water, homemade lemonade (small amount of sugar)  Patient-reported exercise habits: in the afternoons she walks with with family for 1-2 hours  Patient  denies hypoglycemic events. Patient denies polyuria (increased urination).  Patient denies polyphagia (increased appetite).  Patient denies polydipsia (increased thirst).  Patient denies neuropathy (nerve pain). Patient denies visual changes. Patient reports self foot exams.   Objective:   Fasting blood glucose: 294 233, 239 198, 148, 151, 155 Random blood glucose: 164, 156, 120, 226  Physical Exam Neurological:     Mental Status: She is alert and oriented to person, place, and time.    Review of Systems  Gastrointestinal:  Negative for abdominal pain, nausea and vomiting.    Labs:   Lab Results  Component Value Date   HGBA1C 8.4 (A) 09/19/2020   HGBA1C 11.3 (A) 05/22/2020   HGBA1C 12.3 (A) 11/10/2019    Lab Results  Component Value Date   MICRALBCREAT 21 10/05/2019    Lipid Panel     Component Value Date/Time   CHOL 177 08/04/2019 1629   TRIG 172 (H) 08/04/2019 1629   HDL 45 08/04/2019 1629   CHOLHDL 3.9 08/04/2019 1629   CHOLHDL 3.7 10/24/2015 2225   VLDL 31 10/24/2015 2225   LDLCALC 102 (H) 08/04/2019 1629    Clinical Atherosclerotic Cardiovascular Disease (ASCVD): No  The 10-year ASCVD risk score (Arnett DK, et al., 2019) is: 2.4%   Values used to calculate the score:     Age: 63 years     Sex: Female     Is Non-Hispanic African American: No     Diabetic: Yes     Tobacco smoker: No     Systolic Blood Pressure: 116 mmHg     Is BP treated: Yes     HDL Cholesterol: 45 mg/dL  Total Cholesterol: 177 mg/dL    Assessment/Plan:   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 Patient completed 5 doses of Trulicity 3mg  therefore will increase to 4.5mg . Provided patient sample of 1.5mg  pens to use alongside 3mg  pens she has at home until she receives new shipment of 4.5mg  pens. If patient's blood glucose continues to remain elevated will discuss adjusting insulin regimen. Following instruction patient verbalized understanding of treatment  plan.    Continued basal insulin glargine (Lantus) to 70 units once daily.  Continued rapid insulin aspart (Novolog) 16 units TID with meals. Increased GLP-1 Trulicity 4.5 mg once weekly Continued invokamet 50-1000mg  BID.  Extensively discussed pathophysiology of diabetes, dietary effects on blood sugar control, and recommended lifestyle interventions. Patient will adhere to dietary modifications Counseled on s/sx of and management of hypoglycemia Next A1C anticipated in December 2022  Follow-up appointment 4 weeks to review sugar readings. Written patient instructions provided.  This appointment required 50 minutes of direct patient care.  Thank you for involving pharmacy to assist in providing this patient's care.

## 2020-09-19 NOTE — Assessment & Plan Note (Signed)
T2DM is not controlled but improving based on patient's A1C today. Patient completed 5 doses of Trulicity 3mg  therefore will increase to 4.5mg . Provided patient sample of 1.5mg  pens to use alongside 3mg  pens she has at home until she receives new shipment of 4.5mg  pens. If patient's blood glucose continues to remain elevated will discuss adjusting insulin regimen. Following instruction patient verbalized understanding of treatment plan.    1. Continued basal insulin glargine (Lantus) to 70 units once daily.  2. Continued rapid insulin aspart (Novolog) 16 units TID with meals. 3. Increased GLP-1 Trulicity 4.5 mg once weekly 4. Continued invokamet 50-1000mg  BID.  5. Extensively discussed pathophysiology of diabetes, dietary effects on blood sugar control, and recommended lifestyle interventions. 6. Patient will adhere to dietary modifications 7. Counseled on s/sx of and management of hypoglycemia 8. Next A1C anticipated in December 2022  Follow-up appointment 4 weeks to review sugar readings. Written patient instructions provided.

## 2020-09-19 NOTE — Patient Instructions (Addendum)
Robin Klein, fue un placer verla hoy.  Por favor haz lo siguiente:  1. Aumente su Trulicity a 4.5 mg (tome una pluma de 1.5 mg y una pluma de 3 mg juntas el jueves hasta que la compaa le enve la nueva dosis) como se le indique hoy durante su cita. Si tiene alguna pregunta o si cree que ha ocurrido algo debido a este cambio, llmeme a m oa su mdico para informarnos. 2. Contine controlando los niveles de [place] en casa. Es muy importante que los registre y los lleve a su prxima cita con el mdico. 3. Contine haciendo los cambios de estilo de vida que hemos discutido juntos durante nuestra visita. La dieta y el ejercicio juegan un papel importante en la mejora de los niveles de azcar en la [place]. 4. Seguimiento conmigo en cuatro semanas on October 14 @ 2:00 PM   Hipoglucemia o nivel bajo de azcar en la sangre:  El nivel bajo de azcar en la sangre puede ocurrir rpidamente y convertirse en una emergencia si no se trata de inmediato.  Si bien esto no debera suceder con frecuencia, puede ocurrir si se salta una comida o no come lo suficiente. Adems, si su insulina u otros medicamentos para la diabetes tienen una dosis demasiado alta, esto puede hacer que su nivel de azcar en la sangre baje.  Las seales de advertencia de niveles bajos de azcar en la sangre incluyen: 1. Sentirse tembloroso o mareado 2. Sentirse dbil o cansado 3. Hambre excesiva 4. Sentirse ansioso o molesto 5. Sudar incluso cuando no haces ejercicio  Qu hacer si tengo un nivel bajo de azcar en la sangre? Sigue la regla del 15 1. Controle su nivel de azcar en la sangre con su medidor. Si es inferior a 70, contine con el paso 2. 2. Trate con 15 gramos de carbohidratos de accin rpida que se encuentran en 3-4 tabletas de glucosa. Si no hay ninguno disponible, puede probar con caramelos duros, 1 cucharada de azcar o miel, 4 onzas de jugo de frutas o 6 onzas de refresco NORMAL. 3. Vuelva a  controlar su nivel de azcar en 15 minutos. Si todava est por debajo de 70, vuelva a hacer lo que hizo en el paso 2. Si su nivel de azcar en la sangre ha vuelto a subir, contine y coma un refrigerio o una comida pequea compuesta de carbohidratos complejos (p. ej., granos integrales) y protenas en este momento para evitar la recurrencia del nivel bajo de azcar en la sangre.  [name] it was a pleasure seeing you today.   Please do the following:  Increase your Trulicity to 4.5mg  (take one pen of the 1.5mg  and one pen of the 3mg  together on Thursday until the company ships you the new dose) as directed today during your appointment. If you have any questions or if you believe something has occurred because of this change, call me or your doctor to let one of [place] know.  Continue checking blood sugars at home. It's really important that you record these and bring these in to your next doctor's appointment.  Continue making the lifestyle changes we've discussed together during our visit. Diet and exercise play a significant role in improving your blood sugars.  Follow-up with me in four weeks.    Hypoglycemia or low blood sugar:   Low blood sugar can happen quickly and may become an emergency if not treated right away.   While this shouldn't happen often, it  can be brought upon if you skip a meal or do not eat enough. Also, if your insulin or other diabetes medications are dosed too high, this can cause your blood sugar to go to low.   Warning signs of low blood sugar include: Feeling shaky or dizzy Feeling weak or tired  Excessive hunger Feeling anxious or upset  Sweating even when you aren't exercising  What to do if I experience low blood sugar? Follow the Rule of 15 Check your blood sugar with your meter. If lower than 70, proceed to step 2.  Treat with 15 grams of fast acting carbs which is found in 3-4 glucose tablets. If none are available you can try hard candy, 1 tablespoon of  sugar or honey,4 ounces of fruit juice, or 6 ounces of REGULAR soda.  Re-check your sugar in 15 minutes. If it is still below 70, do what you did in step 2 again. If your blood sugar has come back up, go ahead and eat a snack or small meal made up of complex carbs (ex. Whole grains) and protein at this time to avoid recurrence of low blood sugar.

## 2020-10-01 ENCOUNTER — Other Ambulatory Visit: Payer: Self-pay

## 2020-10-01 MED ORDER — CLOPIDOGREL BISULFATE 75 MG PO TABS
ORAL_TABLET | ORAL | 11 refills | Status: DC
  Filled 2020-10-01: qty 30, 30d supply, fill #0

## 2020-10-03 ENCOUNTER — Other Ambulatory Visit (HOSPITAL_COMMUNITY): Payer: Self-pay

## 2020-10-08 ENCOUNTER — Other Ambulatory Visit: Payer: Self-pay

## 2020-10-25 ENCOUNTER — Other Ambulatory Visit (HOSPITAL_COMMUNITY): Payer: Self-pay

## 2020-11-07 ENCOUNTER — Ambulatory Visit (INDEPENDENT_AMBULATORY_CARE_PROVIDER_SITE_OTHER): Payer: Self-pay | Admitting: Pharmacist

## 2020-11-07 DIAGNOSIS — E782 Mixed hyperlipidemia: Secondary | ICD-10-CM

## 2020-11-07 MED ORDER — TRULICITY 4.5 MG/0.5ML ~~LOC~~ SOAJ: 4.5000 mg | SUBCUTANEOUS | 0 refills | Status: DC

## 2020-11-07 NOTE — Progress Notes (Signed)
Subjective:    Patient ID: Robin Klein, female    DOB: 07/30/1970, 50 y.o.   MRN: [id number]  HPI Patient is a 50 y.o. female who presents for diabetes management. She is in good spirits and presents without assistance. Utilized Spanish interpreter via telephone due to video connectivity issues. Patient was referred and last seen by provider, Dr. Dortha Schwalbe on 06/05/20.  Last seen in pharmacy clinic on 09/19/20.  Patient reports her blood glucose readings have been up likely due to eating late at night  Patient reports diabetes was diagnosed around 20 years ago.   Insurance coverage/medication affordability: self-pay  Current diabetes medications include: insulin aspart (Novolog) 16 units TID with meals, insulin glargine (Lantus) 70 units every morning, canagliflozin-metformin (invokamet) 50-1000mg  BID, Trulicity 4.5mg  once weekly on Thursdays (completed 3 doses)  Patient reports non-adherence to medications. She has not taken invokemet in 2 weeks and has not picked up from the pharmacy since 8/18 Current hypertension medications include: amlodipine 10mg  Current hyperlipidemia medications include: N/a - atorvastatin discontinued due to elevated liver enzymes Patient states that She is taking her medications as prescribed.   Does you feel that your medications are working for you?  yes  Have you been experiencing any side effects to the medications prescribed? no  Do you have any problems obtaining medications due to transportation or finances?  No; [facility]   Patient reported dietary habits:  Eats 3 meals/day and 2 snacks/day; Boluses with 3 meals/day and 0 snacks/day Breakfast: milk + bread Lunch: ham sandwich with some mayo Dinner: fish + vegetables Snacks: fruit or chocolate Drinks: water, soda  Patient-reported exercise habits: patient not currently able to walk with family due to back pain.  Patient denies hypoglycemic events. Patient denies  polyuria (increased urination).  Patient reports polyphagia (increased appetite).  Patient denies polydipsia (increased thirst).  Patient denies neuropathy (nerve pain). Patient denies visual changes. Patient reports self foot exams.   Objective:   Fasting blood glucose: 294, 226, 339, 279, 243, 263, 258, 229, 291 Random blood glucose: 295, 175, 283, 265, 323, 239, 203, 257  Physical Exam Neurological:     Mental Status: She is alert and oriented to person, place, and time.    Review of Systems  Gastrointestinal:  Negative for abdominal pain, nausea and vomiting.    Labs:   Lab Results  Component Value Date   HGBA1C 8.4 (A) 09/19/2020   HGBA1C 11.3 (A) 05/22/2020   HGBA1C 12.3 (A) 11/10/2019    Lab Results  Component Value Date   MICRALBCREAT 21 10/05/2019    Lipid Panel     Component Value Date/Time   CHOL 177 08/04/2019 1629   TRIG 172 (H) 08/04/2019 1629   HDL 45 08/04/2019 1629   CHOLHDL 3.9 08/04/2019 1629   CHOLHDL 3.7 10/24/2015 2225   VLDL 31 10/24/2015 2225   LDLCALC 102 (H) 08/04/2019 1629    Clinical Atherosclerotic Cardiovascular Disease (ASCVD): No  The 10-year ASCVD risk score (Arnett DK, et al., 2019) is: 2.4%   Values used to calculate the score:     Age: 53 years     Sex: Female     Is Non-Hispanic African American: No     Diabetic: Yes     Tobacco smoker: No     Systolic Blood Pressure: 116 mmHg     Is BP treated: Yes     HDL Cholesterol: 45 mg/dL     Total Cholesterol: 177 mg/dL  Assessment/Plan:   T2DM is not controlled and was improving, however, has since increased significantly likely due to sub-optimal adherence. Reminded patient of importance of adherence to medications in maintaining blood glucose control. Patient instructed to resume Invokamet but take once daily until follow-up with pharmacy clinic rather than BID due to being off of medication for quite some time and wanting to avoid intolerability. If patient's blood glucose  continues to remain elevated will discuss adjusting insulin regimen. Following instruction patient verbalized understanding of treatment plan.    Continued basal insulin glargine (Lantus) to 70 units once daily.  Continued rapid insulin aspart (Novolog) 16 units TID with meals. Continued GLP-1 Trulicity 4.5 mg once weekly Restarted invokamet 50-1000mg  once daily Extensively discussed pathophysiology of diabetes, dietary effects on blood sugar control, and recommended lifestyle interventions. Patient will adhere to dietary modifications Counseled on s/sx of and management of hypoglycemia Next A1C anticipated in December 2022 Will obtain lipid panel today as patient's last LDL was not controlled and currently not on atorvastatin  Follow-up appointment 2 weeks to review sugar readings. Written patient instructions provided.  This appointment required 40 minutes of direct patient care.  Thank you for involving pharmacy to assist in providing this patient's care.

## 2020-11-07 NOTE — Patient Instructions (Signed)
Miss Robin Klein it was a pleasure seeing you today.   Please do the following:  Start Invokamet again but only take once a day until our next appointment as directed today during your appointment. If you have any questions or if you believe something has occurred because of this change, call me or your doctor to let one of [place] know.  Continue checking blood sugars at home. It's really important that you record these and bring these in to your next doctor's appointment.  Continue making the lifestyle changes we've discussed together during our visit. Diet and exercise play a significant role in improving your blood sugars.  Follow-up with me/ in four weeks.   Hypoglycemia or low blood sugar:   Low blood sugar can happen quickly and may become an emergency if not treated right away.   While this shouldn't happen often, it can be brought upon if you skip a meal or do not eat enough. Also, if your insulin or other diabetes medications are dosed too high, this can cause your blood sugar to go to low.   Warning signs of low blood sugar include: Feeling shaky or dizzy Feeling weak or tired  Excessive hunger Feeling anxious or upset  Sweating even when you aren't exercising  What to do if I experience low blood sugar? Follow the Rule of 15 Check your blood sugar with your meter. If lower than 70, proceed to step 2.  Treat with 15 grams of fast acting carbs which is found in 3-4 glucose tablets. If none are available you can try hard candy, 1 tablespoon of sugar or honey,4 ounces of fruit juice, or 6 ounces of REGULAR soda.  Re-check your sugar in 15 minutes. If it is still below 70, do what you did in step 2 again. If your blood sugar has come back up, go ahead and eat a snack or small meal made up of complex carbs (ex. Whole grains) and protein at this time to avoid recurrence of low blood sugar.

## 2020-11-07 NOTE — Assessment & Plan Note (Signed)
T2DM is not controlled and was improving, however, has since increased significantly likely due to sub-optimal adherence. Reminded patient of importance of adherence to medications in maintaining blood glucose control. Patient instructed to resume Invokamet but take once daily until follow-up with pharmacy clinic rather than BID due to being off of medication for quite some time and wanting to avoid intolerability. If patient's blood glucose continues to remain elevated will discuss adjusting insulin regimen. Following instruction patient verbalized understanding of treatment plan.    1. Continued basal insulin glargine (Lantus) to 70 units once daily.  2. Continued rapid insulin aspart (Novolog) 16 units TID with meals. 3. Continued GLP-1 Trulicity 4.5 mg once weekly 4. Restarted invokamet 50-1000mg  once daily 5. Extensively discussed pathophysiology of diabetes, dietary effects on blood sugar control, and recommended lifestyle interventions. 6. Patient will adhere to dietary modifications 7. Counseled on s/sx of and management of hypoglycemia 8. Next A1C anticipated in December 2022 9. Will obtain lipid panel today as patient's last LDL was not controlled and currently not on atorvastatin  Follow-up appointment 2 weeks to review sugar readings. Written patient instructions provided.

## 2020-11-08 ENCOUNTER — Other Ambulatory Visit: Payer: Self-pay

## 2020-11-08 ENCOUNTER — Other Ambulatory Visit (HOSPITAL_BASED_OUTPATIENT_CLINIC_OR_DEPARTMENT_OTHER): Payer: Self-pay

## 2020-11-09 LAB — LIPID PANEL
Chol/HDL Ratio: 4 ratio (ref 0.0–4.4)
Cholesterol, Total: 156 mg/dL (ref 100–199)
HDL: 39 mg/dL — ABNORMAL LOW (ref >39)
LDL Chol Calc (NIH): 87 mg/dL (ref 0–99)
Triglycerides: 177 mg/dL — ABNORMAL HIGH (ref 0–149)
VLDL Cholesterol Cal: 30 mg/dL (ref 5–40)

## 2020-11-12 ENCOUNTER — Other Ambulatory Visit: Payer: Self-pay

## 2020-11-12 ENCOUNTER — Ambulatory Visit (INDEPENDENT_AMBULATORY_CARE_PROVIDER_SITE_OTHER): Payer: Self-pay | Admitting: Internal Medicine

## 2020-11-12 ENCOUNTER — Encounter: Payer: Self-pay | Admitting: Internal Medicine

## 2020-11-12 ENCOUNTER — Other Ambulatory Visit (HOSPITAL_COMMUNITY): Payer: Self-pay

## 2020-11-12 DIAGNOSIS — M545 Low back pain, unspecified: Secondary | ICD-10-CM

## 2020-11-12 MED ORDER — LIDOCAINE 4 % EX PTCH
1.0000 | MEDICATED_PATCH | CUTANEOUS | 0 refills | Status: DC | PRN
  Filled 2020-11-12: qty 14, fill #0

## 2020-11-12 NOTE — Patient Instructions (Addendum)
Thank you, Robin Klein for allowing [place] to provide your care today.    Discutimos el dolor en tu espalda. Yo recomendo que tomas Tylenol 1000 mg tres veces en un dia para Chief Technology Officer. Voy a darte un parche de lidocana hoy. Y ejercicios para tu espalda. Regresa [employer].   I have ordered the following labs for you:  Lab Orders  No laboratory test(s) ordered today     Tests ordered today:  Nada  Referrals ordered today:   Referral Orders  No referral(s) requested today     I have ordered the following medication/changed the following medications:   Stop the following medications: There are no discontinued medications.   Start the following medications: Meds ordered this encounter  Medications   Lidocaine (HM LIDOCAINE PATCH) 4 % PTCH    Sig: Apply 1 patch topically as needed.    Dispense:  14 patch    Refill:  0     Follow up:  dos semanas     Should you have any questions or concerns please call the internal medicine clinic at [phone].

## 2020-11-12 NOTE — Assessment & Plan Note (Signed)
The patient is currently followed by pharmacy here.  Her last visit was 1 week ago, at which time she restarted Invokamet.  Patient states that she has been compliant with her medications and is pleased with her visits with pharmacy, stating that they have helped her with taking her meds on a regular basis.  P: Continue current diabetes regimen.

## 2020-11-12 NOTE — Progress Notes (Signed)
   CC: Right lower back pain  HPI:  [name] is a 50 y.o. with a PMHx as listed below who presents with a chief complaint of back pain. Please see problem-based list for further details, assessments, and plans.  Past Medical History:  Diagnosis Date   Depression    Diabetes mellitus    Fatty liver disease, nonalcoholic    Hypertension    Sinusitis    Review of Systems:  Review of Systems  Constitutional:  Positive for fever. Negative for chills.  HENT: Negative.    Eyes: Negative.   Respiratory: Negative.    Cardiovascular: Negative.   Gastrointestinal: Negative.   Genitourinary: Negative.   Musculoskeletal:        Right lower back pain, intermittent with associated numbness.  Skin: Negative.   Neurological:  Positive for tingling. Negative for weakness.  Endo/Heme/Allergies: Negative.   Psychiatric/Behavioral: Negative.     Physical Exam:  Vitals:   11/12/20 0846  BP: 118/79  Pulse: 87  Temp: 98.3 F (36.8 C)  TempSrc: Oral  SpO2: 98%  Weight: 158 lb 8 oz (71.9 kg)  Height: 5' (1.524 m)   Physical Exam Constitutional:      General: She is not in acute distress.    Appearance: Normal appearance.  HENT:     Head: Normocephalic and atraumatic.  Eyes:     Extraocular Movements: Extraocular movements intact.     Pupils: Pupils are equal, round, and reactive to light.  Cardiovascular:     Rate and Rhythm: Normal rate and regular rhythm.     Heart sounds: No murmur heard.   No friction rub. No gallop.  Pulmonary:     Effort: Pulmonary effort is normal. No respiratory distress.     Breath sounds: Normal breath sounds. No wheezing, rhonchi or rales.  Abdominal:     General: Abdomen is flat. There is no distension.     Palpations: Abdomen is soft.  Musculoskeletal:        General: No swelling, deformity or signs of injury.     Cervical back: No swelling, edema, erythema or signs of trauma.     Thoracic back: Tenderness present. No swelling,  edema, deformity or signs of trauma.     Lumbar back: Tenderness present. No swelling or deformity. Negative right straight leg raise test and negative left straight leg raise test.  Skin:    General: Skin is warm and dry.  Neurological:     Mental Status: She is alert and oriented to person, place, and time. Mental status is at baseline.     Motor: No weakness.     Comments: 5/5 strength throughout BLE. Sensation intact in BLE. There is decreased sensation and tenderness in the right L3-L5 regions  Psychiatric:        Mood and Affect: Mood normal.        Behavior: Behavior normal.     Assessment & Plan:   See Encounters Tab for problem based charting.  Patient seen with Dr.  Sol Blazing

## 2020-11-12 NOTE — Assessment & Plan Note (Signed)
The patient presents to the clinic today with chief complaint of right-sided lower back pain.  She states that the back pain has been intermittent for the past 6 months.  She describes it as "stabbing with heat."  She also endorses numbness in the right lower back area that does not radiate down her leg.  She does not currently have numbness (the episode lasted for a couple of weeks last month and went away on its own).  She rates the pain as a 4 or 5 out of 10 today.  The pain is exacerbated by certain movements, such as turning to her side or getting up.  She also describes feeling a "bubble that expands" in her right lower back.  She states that she feels hot, however denies night sweats.  She has not measured her temperature at home.  For the back pain, she has tried icy hot and up to 1000 mg of Tylenol.  This has somewhat relieved her pain, but not significantly.  Denies incontinence.  Per chart review, about 1 year ago the patient had gradual onset of right lower back pain after lifting a rock and water bottles.  At that time, she was seen in our clinic, and conservative measures were recommended.  She states that her back pain currently feels similar to the pain she experienced a year ago.  Also of note, patient states that she was seen in urgent care in May of this year.  Imaging at that time was not concerning for fracture.  She was discharged with Flexeril and Tylenol.  On physical exam, patient endorses the most pain in her right lower back area.  She does state that it feels numb compared to contralateral side.  The pain radiates to the mid back and upper buttock region but does not radiate any lower.  Straight leg raise bilaterally was negative.  5/5 strength in musculature of BLE.  Sensation is intact in BLE otherwise.    P: At this time, we recommend regular treatment with Tylenol.  We have instructed her to take this 3 times a day, 1000 mg at a time.  We have also prescribed a lidocaine  patch as needed for pain.  We have provided home back exercises for the patient.  She is working on getting an orange card and would like to hold off on formal physical therapy at this time.   Follow-up in 2 weeks for repeat clinical evaluation for assessment of her back pain after conservative measures.

## 2020-11-14 NOTE — Progress Notes (Signed)
Internal Medicine Clinic Attending  I saw and evaluated the patient.  I personally confirmed the key portions of the history and exam documented by Dr. Alroy Bailiff and I reviewed pertinent patient test results.  The assessment, diagnosis, and plan were formulated together and I agree with the documentation in the resident's note. History and exam seem most consistent with paraspinal muscular strain/sprain, although episode of numbness described last month would be unusual. Strength intact. Symptoms not consistent with true fever but described as a feeling of "heat" when the pain is severe. Would continue to consider formal PT if able to apply for [facility] card, possible imaging if symptoms do not improve.

## 2020-11-21 ENCOUNTER — Other Ambulatory Visit (HOSPITAL_COMMUNITY): Payer: Self-pay

## 2020-11-21 ENCOUNTER — Ambulatory Visit (INDEPENDENT_AMBULATORY_CARE_PROVIDER_SITE_OTHER): Payer: Self-pay | Admitting: Pharmacist

## 2020-11-21 DIAGNOSIS — E1165 Type 2 diabetes mellitus with hyperglycemia: Secondary | ICD-10-CM

## 2020-11-21 DIAGNOSIS — I10 Essential (primary) hypertension: Secondary | ICD-10-CM

## 2020-11-21 MED ORDER — INVOKAMET 50-1000 MG PO TABS
1.0000 | ORAL_TABLET | Freq: Two times a day (BID) | ORAL | 1 refills | Status: DC
  Filled 2020-11-21: qty 60, 30d supply, fill #0
  Filled 2021-01-16: qty 60, 30d supply, fill #1

## 2020-11-21 MED ORDER — AMLODIPINE BESYLATE 10 MG PO TABS
10.0000 mg | ORAL_TABLET | Freq: Every day | ORAL | 1 refills | Status: DC
  Filled 2020-11-21: qty 30, 30d supply, fill #0
  Filled 2021-01-03: qty 30, 30d supply, fill #1

## 2020-11-21 NOTE — Progress Notes (Signed)
Subjective:    Patient ID: Robin Klein, female    DOB: Jun 30, 1970, 50 y.o.   MRN: [id number]  HPI Patient is a 50 y.o. female who presents for diabetes management. She is in good spirits and presents without assistance. Utilized Spanish interpreter [phone]. Patient was referred and last seen by provider, Dr. Dortha Schwalbe on 06/05/20.  Last seen in pharmacy clinic on 11/07/20.  Patient reports her blood glucose readings have been up likely due to eating late at night  "Much better than the last time" 109-159;  200 (after meal)  Forgot meter  Patient reports diabetes was diagnosed around 20 years ago.   Insurance coverage/medication affordability: self-pay  Current diabetes medications include: insulin aspart (Novolog) 16 units TID with meals, insulin glargine (Lantus) 70 units every morning, canagliflozin-metformin (invokamet) 50-1000mg  once daily, Trulicity 4.5mg  once weekly on Thursdays  Patient reports non-adherence to medications. She has not taken invokemet in 2 weeks and has not picked up from the pharmacy since 8/18 Current hypertension medications include: amlodipine 10mg  Current hyperlipidemia medications include: N/a - atorvastatin discontinued due to elevated liver enzymes Patient states that She is taking her medications as prescribed.   Does you feel that your medications are working for you?  yes  Have you been experiencing any side effects to the medications prescribed? no  Do you have any problems obtaining medications due to transportation or finances?  No; uses Outpatient [facility]   Patient reported dietary habits:  Eats 3 meals/day and 2 snacks/day; Boluses with 3 meals/day and 0 snacks/day Breakfast: vegetable juice + oatmeal Lunch: ham, eggs, onions Dinner: fish tacos + vegetables Snacks: fruit, chocolate, fruit Drinks: water, tea (no sugar)  Patient-reported exercise habits: patient not currently able to walk with family due to back  pain.  Patient denies hypoglycemic events. Patient denies polyuria (increased urination).  Patient reports polyphagia (increased appetite).  Patient denies polydipsia (increased thirst).  Patient denies neuropathy (nerve pain). Patient denies visual changes. Patient reports self foot exams.   Objective:   Fasting blood glucose: 294, 226, 339, 279, 243, 263, 258, 229, 291 Random blood glucose: 295, 175, 283, 265, 323, 239, 203, 257  Physical Exam Neurological:     Mental Status: She is alert and oriented to person, place, and time.    Review of Systems  Gastrointestinal:  Negative for abdominal pain, nausea and vomiting.    Labs:   Lab Results  Component Value Date   HGBA1C 8.4 (A) 09/19/2020   HGBA1C 11.3 (A) 05/22/2020   HGBA1C 12.3 (A) 11/10/2019    Lab Results  Component Value Date   MICRALBCREAT 21 10/05/2019    Lipid Panel     Component Value Date/Time   CHOL 156 11/07/2020 1443   TRIG 177 (H) 11/07/2020 1443   HDL 39 (L) 11/07/2020 1443   CHOLHDL 4.0 11/07/2020 1443   CHOLHDL 3.7 10/24/2015 2225   VLDL 31 10/24/2015 2225   LDLCALC 87 11/07/2020 1443    Clinical Atherosclerotic Cardiovascular Disease (ASCVD): No  The 10-year ASCVD risk score (Arnett DK, et al., 2019) is: 2.6%   Values used to calculate the score:     Age: 67 years     Sex: Female     Is Non-Hispanic African American: No     Diabetic: Yes     Tobacco smoker: No     Systolic Blood Pressure: 118 mmHg     Is BP treated: Yes     HDL Cholesterol: 39 mg/dL  Total Cholesterol: 156 mg/dL    Assessment/Plan:   R6EA is not controlled and was improving, however, has since increased significantly likely due to sub-optimal adherence. Reminded patient of importance of adherence to medications in maintaining blood glucose control. Patient instructed to resume Invokamet but take once daily until follow-up with pharmacy clinic rather than BID due to being off of medication for quite some time and  wanting to avoid intolerability. If patient's blood glucose continues to remain elevated will discuss adjusting insulin regimen. Following instruction patient verbalized understanding of treatment plan.    Continued basal insulin glargine (Lantus) to 70 units once daily.  Continued rapid insulin aspart (Novolog) 16 units TID with meals. Continued GLP-1 Trulicity 4.5 mg once weekly Increased invokamet 50-1000mg  to BID Extensively discussed pathophysiology of diabetes, dietary effects on blood sugar control, and recommended lifestyle interventions. Patient will adhere to dietary modifications Counseled on s/sx of and management of hypoglycemia Next A1C anticipated in December 2022 Will obtain lipid panel today as patient's last LDL was not controlled and currently not on atorvastatin  Follow-up appointment 2 weeks to review sugar readings. Written patient instructions provided.  This appointment required 40 minutes of direct patient care.  Thank you for involving pharmacy to assist in providing this patient's care.

## 2020-11-21 NOTE — Patient Instructions (Addendum)
Robin Klein fue un placer verla hoy.  Por favor haz lo siguiente:  1. Aumente Invokamet a Occupational hygienist al [employer] se le indic hoy durante su cita. Si tiene alguna pregunta o si cree que ha ocurrido algo debido a este cambio, llmeme a m oa su mdico para informarnos. 2. Contine controlando los niveles de [place] en casa. Es muy importante que los registre y los lleve a su prxima cita con el mdico. 3. Contine haciendo los cambios de estilo de vida que hemos discutido juntos durante nuestra visita. La dieta y el ejercicio juegan un papel importante en la mejora de los niveles de azcar en la [place]. 4. Sgueme el 19/12 a las 11:00 a. m.   Hipoglucemia o nivel bajo de azcar en la sangre:  El nivel bajo de azcar en la sangre puede ocurrir rpidamente y convertirse en una emergencia si no se trata de inmediato.  Si bien esto no debera suceder con frecuencia, puede ocurrir si se salta una comida o no come lo suficiente. Adems, si su insulina u otros medicamentos para la diabetes tienen una dosis demasiado alta, esto puede hacer que su nivel de azcar en la sangre baje.  Las seales de advertencia de niveles bajos de azcar en la sangre incluyen: 1. Sentirse tembloroso o mareado 2. Sentirse dbil o cansado 3. Hambre excesiva 4. Sentirse ansioso o molesto 5. Sudar incluso cuando no haces ejercicio  Qu hacer si tengo un nivel bajo de azcar en la sangre? Sigue la regla del 15 1. Controle su nivel de azcar en la sangre con su medidor. Si es inferior a 70, contine con el paso 2. 2. Trate con 15 gramos de carbohidratos de accin rpida que se encuentran en 3-4 tabletas de glucosa. Si no hay ninguno disponible, puede probar con caramelos duros, 1 cucharada de azcar o miel, 4 onzas de jugo de frutas o 6 onzas de refresco NORMAL. 3. Vuelva a controlar su nivel de azcar en 15 minutos. Si todava est por debajo de 70, vuelva a hacer lo que hizo en el  paso 2. Si su nivel de azcar en la sangre ha vuelto a subir, contine y coma un refrigerio o una comida pequea compuesta de carbohidratos complejos (p. ej., granos integrales) y protenas en este momento para evitar la recurrencia del nivel bajo de azcar en la sangre.  Robin Klein it was a pleasure seeing you today.   Please do the following:  Increase Invokamet to one pill twice a day as directed today during your appointment. If you have any questions or if you believe something has occurred because of this change, call me or your doctor to let one of [place] know.  Continue checking blood sugars at home. It's really important that you record these and bring these in to your next doctor's appointment.  Continue making the lifestyle changes we've discussed together during our visit. Diet and exercise play a significant role in improving your blood sugars.  Follow-up with me on 12/19 at 11:00 AM   Hypoglycemia or low blood sugar:   Low blood sugar can happen quickly and may become an emergency if not treated right away.   While this shouldn't happen often, it can be brought upon if you skip a meal or do not eat enough. Also, if your insulin or other diabetes medications are dosed too high, this can cause your blood sugar to go to low.   Warning signs of  low blood sugar include: Feeling shaky or dizzy Feeling weak or tired  Excessive hunger Feeling anxious or upset  Sweating even when you aren't exercising  What to do if I experience low blood sugar? Follow the Rule of 15 Check your blood sugar with your meter. If lower than 70, proceed to step 2.  Treat with 15 grams of fast acting carbs which is found in 3-4 glucose tablets. If none are available you can try hard candy, 1 tablespoon of sugar or honey,4 ounces of fruit juice, or 6 ounces of REGULAR soda.  Re-check your sugar in 15 minutes. If it is still below 70, do what you did in step 2 again. If your blood sugar has come back  up, go ahead and eat a snack or small meal made up of complex carbs (ex. Whole grains) and protein at this time to avoid recurrence of low blood sugar.

## 2020-11-26 ENCOUNTER — Telehealth: Payer: Self-pay | Admitting: *Deleted

## 2020-11-26 ENCOUNTER — Encounter: Payer: Self-pay | Admitting: Internal Medicine

## 2020-11-26 NOTE — Telephone Encounter (Signed)
Called patient regarding her missed appointment. Left message for patient to call the main number to reschedule, [phone]

## 2020-11-27 ENCOUNTER — Other Ambulatory Visit: Payer: Self-pay

## 2020-11-29 ENCOUNTER — Other Ambulatory Visit (HOSPITAL_COMMUNITY): Payer: Self-pay

## 2020-12-23 ENCOUNTER — Encounter: Payer: Self-pay | Admitting: Pharmacist

## 2021-01-01 ENCOUNTER — Other Ambulatory Visit (HOSPITAL_COMMUNITY): Payer: Self-pay

## 2021-01-03 ENCOUNTER — Other Ambulatory Visit (HOSPITAL_COMMUNITY): Payer: Self-pay

## 2021-01-16 ENCOUNTER — Other Ambulatory Visit (HOSPITAL_COMMUNITY): Payer: Self-pay

## 2021-02-06 ENCOUNTER — Other Ambulatory Visit (HOSPITAL_COMMUNITY): Payer: Self-pay

## 2021-02-06 ENCOUNTER — Other Ambulatory Visit: Payer: Self-pay | Admitting: Internal Medicine

## 2021-02-06 DIAGNOSIS — I10 Essential (primary) hypertension: Secondary | ICD-10-CM

## 2021-02-06 MED ORDER — AMLODIPINE BESYLATE 10 MG PO TABS
10.0000 mg | ORAL_TABLET | Freq: Every day | ORAL | 1 refills | Status: DC
  Filled 2021-02-06: qty 30, 30d supply, fill #0
  Filled 2021-03-12: qty 30, 30d supply, fill #1

## 2021-02-07 ENCOUNTER — Other Ambulatory Visit (HOSPITAL_COMMUNITY): Payer: Self-pay

## 2021-02-21 ENCOUNTER — Other Ambulatory Visit (HOSPITAL_COMMUNITY): Payer: Self-pay

## 2021-03-03 ENCOUNTER — Other Ambulatory Visit: Payer: Self-pay

## 2021-03-12 ENCOUNTER — Other Ambulatory Visit (HOSPITAL_COMMUNITY): Payer: Self-pay

## 2021-04-01 ENCOUNTER — Other Ambulatory Visit (HOSPITAL_COMMUNITY): Payer: Self-pay

## 2021-04-14 ENCOUNTER — Other Ambulatory Visit (HOSPITAL_COMMUNITY): Payer: Self-pay

## 2021-04-14 ENCOUNTER — Other Ambulatory Visit: Payer: Self-pay | Admitting: Internal Medicine

## 2021-04-14 DIAGNOSIS — I10 Essential (primary) hypertension: Secondary | ICD-10-CM

## 2021-04-15 ENCOUNTER — Other Ambulatory Visit (HOSPITAL_COMMUNITY): Payer: Self-pay

## 2021-04-18 ENCOUNTER — Other Ambulatory Visit: Payer: Self-pay | Admitting: Internal Medicine

## 2021-04-18 ENCOUNTER — Other Ambulatory Visit (HOSPITAL_COMMUNITY): Payer: Self-pay

## 2021-04-18 DIAGNOSIS — I10 Essential (primary) hypertension: Secondary | ICD-10-CM

## 2021-04-21 ENCOUNTER — Other Ambulatory Visit (HOSPITAL_COMMUNITY): Payer: Self-pay

## 2021-04-23 ENCOUNTER — Other Ambulatory Visit: Payer: Self-pay | Admitting: Internal Medicine

## 2021-04-23 ENCOUNTER — Other Ambulatory Visit (HOSPITAL_COMMUNITY): Payer: Self-pay

## 2021-04-23 DIAGNOSIS — I10 Essential (primary) hypertension: Secondary | ICD-10-CM

## 2021-04-24 ENCOUNTER — Other Ambulatory Visit (HOSPITAL_COMMUNITY): Payer: Self-pay

## 2021-04-28 ENCOUNTER — Ambulatory Visit (INDEPENDENT_AMBULATORY_CARE_PROVIDER_SITE_OTHER): Payer: Self-pay | Admitting: Internal Medicine

## 2021-04-28 ENCOUNTER — Other Ambulatory Visit: Payer: Self-pay

## 2021-04-28 ENCOUNTER — Other Ambulatory Visit (HOSPITAL_COMMUNITY): Payer: Self-pay

## 2021-04-28 ENCOUNTER — Encounter: Payer: Self-pay | Admitting: Internal Medicine

## 2021-04-28 VITALS — BP 142/84 | HR 102 | Wt 155.8 lb

## 2021-04-28 DIAGNOSIS — E119 Type 2 diabetes mellitus without complications: Secondary | ICD-10-CM

## 2021-04-28 DIAGNOSIS — I10 Essential (primary) hypertension: Secondary | ICD-10-CM

## 2021-04-28 DIAGNOSIS — Z794 Long term (current) use of insulin: Secondary | ICD-10-CM

## 2021-04-28 LAB — POCT GLYCOSYLATED HEMOGLOBIN (HGB A1C): Hemoglobin A1C: 8.4 % — AB (ref 4.0–5.6)

## 2021-04-28 LAB — GLUCOSE, CAPILLARY: Glucose-Capillary: 276 mg/dL — ABNORMAL HIGH (ref 70–99)

## 2021-04-28 MED ORDER — INVOKAMET 50-1000 MG PO TABS
1.0000 | ORAL_TABLET | Freq: Two times a day (BID) | ORAL | 1 refills | Status: DC
  Filled 2021-04-28: qty 60, 30d supply, fill #0

## 2021-04-28 MED ORDER — AMLODIPINE BESYLATE 10 MG PO TABS
10.0000 mg | ORAL_TABLET | Freq: Every day | ORAL | 1 refills | Status: DC
  Filled 2021-04-28: qty 30, 30d supply, fill #0
  Filled 2021-05-27: qty 30, 30d supply, fill #1
  Filled 2021-06-30: qty 30, 30d supply, fill #2
  Filled 2021-08-04: qty 30, 30d supply, fill #3
  Filled 2021-09-09: qty 30, 30d supply, fill #4
  Filled 2021-10-10: qty 30, 30d supply, fill #5

## 2021-04-28 NOTE — Progress Notes (Signed)
? ?  CC: chronic medical condition follow up, type 2 diabetes mellitus and htn ? ?HPI:Robin Klein is a 51 y.o. female who presents for evaluation of T2DM and HTN. Please see individual problem based A/P for details. ? ?Past Medical History:  ?Diagnosis Date  ? Depression   ? Diabetes mellitus   ? Fatty liver disease, nonalcoholic   ? Hypertension   ? Sinusitis   ? ?Review of Systems:   ?Review of Systems  ?Constitutional:  Negative for chills and fever.  ?Neurological:  Positive for headaches (occasional , since running out of BP med). Negative for dizziness and loss of consciousness.   ? ?Physical Exam: ?Vitals:  ? 04/28/21 1437  ?BP: (!) 142/84  ?Pulse: (!) 102  ?SpO2: 99%  ?Weight: 155 lb 12.8 oz (70.7 kg)  ? ? ? ?Physical Exam ?Constitutional:   ?   Appearance: Robin Klein is obese.  ?Cardiovascular:  ?   Rate and Rhythm: Regular rhythm. Tachycardia present.  ?Pulmonary:  ?   Effort: Pulmonary effort is normal.  ?   Breath sounds: Normal breath sounds.  ?Skin: ?   General: Skin is warm and dry.  ?Neurological:  ?   Mental Status: Robin Klein is alert.  ?Psychiatric:     ?   Mood and Affect: Mood normal.     ?   Behavior: Behavior normal.  ? ? ? ?Assessment & Plan:  ? ?Type 2 diabetes mellitus (HCC) ?Here for routine follow up of Type 2 Diabetes Mellitus. Robin Klein is currently taking Lantus 70 units daily, Novolog 16 units TID, Trulicity 4.5 mg weekly , and Invokamet 50-1000 BID. Robin Klein has been out of Invokamet for one month. I expect this is underestimated due to refills on medications list. Hemoglobin A1c at last visit 8.4 and 8.4 today.  Foot exam completed today.  Robin Klein is seen by eye doctor yearly and reports no damage to her eyes.  Patient's has 24 checks recently. The date and time are incorrect on meter, but this is interpreted as a reflection of the past month.  Average reading 352, min 187, max 434.  Patient does not report any hypoglycemic episodes. ? ?Assessment/plan: Type 2 diabetes mellitus with long-term  insulin use, no complications, uncontrolled.  I expect patient's hemoglobin A1c to reach goal by taking Invokamet regularly. No changes made today. If not controlled today we could consider splitting basal insulin dose and prescribing BID. Will need more regularly blood glucose checks to make adjustments to Insulin.  ?-Refill Invokanamet ?-Sent message to our pharmacy patient advocate who is helping with Trulicity, patient reports Robin Klein is almost due for refill through assistance program ?-F/u in 3 months ? ?Essential hypertension, benign ?Patient is out of amlodipine. BP 142/84. Robin Klein has had occasional headaches and feeling of being tired since running out of medication.  ? ?Assessment/Plan: Hypertension, uncontrolled today. Refill amlodipine, expect BP will be controlled on this medications .  ? ? ?  ? ? ? ? ?Patient discussed with Dr. Sol Blazing ? ?

## 2021-04-28 NOTE — Assessment & Plan Note (Signed)
Patient is out of amlodipine. BP 142/84. She has had occasional headaches and feeling of being tired since running out of medication.  ? ?Assessment/Plan: Hypertension, uncontrolled today. Refill amlodipine, expect BP will be controlled on this medications .  ? ? ?  ? ?

## 2021-04-28 NOTE — Telephone Encounter (Signed)
Call to patient no answer

## 2021-04-28 NOTE — Telephone Encounter (Signed)
Pt want to know if she can have amLODipine (NORVASC) 10 MG tablet filled before her appt on 04/30/2021. States she is having headache, and her bp is elevated. Please call pt back.  ?

## 2021-04-28 NOTE — Patient Instructions (Signed)
Thank you for trusting me with your care. To recap, today we discussed the following: ? ? ?Type 2 diabetes mellitus  ?- POC Hbg A1C ?- Canagliflozin-metFORMIN HCl (INVOKAMET) 50-1000 MG TABS; Take 1 tablet by mouth 2 (two) times daily.  Dispense: 180 tablet; Refill: 1 ?I sent a message to our pharmacist to make sure you receive your Trulicity.  ? ?Hypertension ?BP: (!) 142/84  ?Refilled medication. This should bring your blood pressure down to normal.  ?- amLODipine (NORVASC) 10 MG tablet; Take 1 tablet (10 mg total) by mouth daily.  Dispense: 90 tablet; Refill: 1 ? ? ?

## 2021-04-28 NOTE — Assessment & Plan Note (Signed)
Here for routine follow up of Type 2 Diabetes Mellitus. She is currently taking Lantus 70 units daily, Novolog 16 units TID, Trulicity 4.5 mg weekly , and Invokamet 50-1000 BID. She has been out of Invokamet for one month. I expect this is underestimated due to refills on medications list. Hemoglobin A1c at last visit 8.4 and 8.4 today.  Foot exam completed today.  She is seen by eye doctor yearly and reports no damage to her eyes.  Patient's has 24 checks recently. The date and time are incorrect on meter, but this is interpreted as a reflection of the past month.  Average reading 352, min 187, max 434.  Patient does not report any hypoglycemic episodes. ? ?Assessment/plan: Type 2 diabetes mellitus with long-term insulin use, no complications, uncontrolled.  I expect patient's hemoglobin A1c to reach goal by taking Invokamet regularly. No changes made today. If not controlled today we could consider splitting basal insulin dose and prescribing BID. Will need more regularly blood glucose checks to make adjustments to Insulin.  ?-Refill Invokanamet ?-Sent message to our pharmacy patient advocate who is helping with Trulicity, patient reports she is almost due for refill through assistance program ?-F/u in 3 months ?

## 2021-04-30 ENCOUNTER — Encounter: Payer: Self-pay | Admitting: Internal Medicine

## 2021-04-30 NOTE — Progress Notes (Signed)
Internal Medicine Clinic Attending  Case discussed with Dr. Steen  At the time of the visit.  We reviewed the resident's history and exam and pertinent patient test results.  I agree with the assessment, diagnosis, and plan of care documented in the resident's note.  

## 2021-05-05 ENCOUNTER — Other Ambulatory Visit: Payer: Self-pay | Admitting: Internal Medicine

## 2021-05-05 MED ORDER — TRULICITY 4.5 MG/0.5ML ~~LOC~~ SOAJ: 4.5000 mg | SUBCUTANEOUS | 2 refills | Status: DC

## 2021-05-12 ENCOUNTER — Other Ambulatory Visit (HOSPITAL_COMMUNITY): Payer: Self-pay

## 2021-05-22 ENCOUNTER — Ambulatory Visit (INDEPENDENT_AMBULATORY_CARE_PROVIDER_SITE_OTHER): Payer: Self-pay | Admitting: Internal Medicine

## 2021-05-22 ENCOUNTER — Other Ambulatory Visit: Payer: Self-pay

## 2021-05-22 ENCOUNTER — Encounter: Payer: Self-pay | Admitting: Internal Medicine

## 2021-05-22 ENCOUNTER — Other Ambulatory Visit (HOSPITAL_COMMUNITY): Payer: Self-pay

## 2021-05-22 VITALS — BP 130/83 | HR 94 | Temp 98.2°F | Ht 60.0 in | Wt 153.8 lb

## 2021-05-22 DIAGNOSIS — E1165 Type 2 diabetes mellitus with hyperglycemia: Secondary | ICD-10-CM

## 2021-05-22 DIAGNOSIS — Z0000 Encounter for general adult medical examination without abnormal findings: Secondary | ICD-10-CM

## 2021-05-22 DIAGNOSIS — Z794 Long term (current) use of insulin: Secondary | ICD-10-CM

## 2021-05-22 DIAGNOSIS — E119 Type 2 diabetes mellitus without complications: Secondary | ICD-10-CM

## 2021-05-22 MED ORDER — TRULICITY 4.5 MG/0.5ML ~~LOC~~ SOAJ
4.5000 mg | SUBCUTANEOUS | 2 refills | Status: DC
  Filled 2021-05-22: qty 2, 28d supply, fill #0

## 2021-05-22 MED ORDER — LANTUS SOLOSTAR 100 UNIT/ML ~~LOC~~ SOPN
70.0000 [IU] | PEN_INJECTOR | Freq: Every day | SUBCUTANEOUS | 3 refills | Status: DC
  Filled 2021-05-22: qty 45, 64d supply, fill #0
  Filled 2021-05-27: qty 18, 25d supply, fill #0
  Filled 2021-06-30: qty 18, 25d supply, fill #1
  Filled 2021-08-04: qty 18, 25d supply, fill #2
  Filled 2021-09-09: qty 18, 25d supply, fill #3
  Filled 2021-10-10: qty 18, 25d supply, fill #4
  Filled 2021-10-24 – 2021-11-05 (×2): qty 18, 25d supply, fill #5
  Filled 2021-12-19: qty 18, 25d supply, fill #6
  Filled 2022-01-22: qty 21, 30d supply, fill #7
  Filled 2022-02-13: qty 21, 30d supply, fill #8
  Filled 2022-04-22: qty 12, 17d supply, fill #9

## 2021-05-22 NOTE — Progress Notes (Signed)
Office Visit   Patient ID: [name], female    DOB: February 24, 1970, 51 y.o.   MRN: [id number]   PCP: Adron Bene, MD   Subjective:  CC: Follow-up (DENTAL REFERRAL / MEDICATION REFILL / DM  / DM EYE EXAM-past orange card up to date.)   Robin Klein is a 51 y.o. year old female who presents for the above medical condition(s). Please refer to problem based charting for assessment and plan.    Objective:   BP 130/83 (BP Location: Left Arm, Patient Position: Sitting, Cuff Size: Normal)   Pulse 94   Temp 98.2 F (36.8 C) (Oral)   Ht 5' (1.524 m)   Wt 153 lb 12.8 oz (69.8 kg)   SpO2 99%   BMI 30.04 kg/m   General: Well-appearing female in no acute distress Cardiac: Heart regular rate and rhythm Pulm: Lung sounds are clear Assessment & Plan:    Type 2 diabetes mellitus (HCC)    Current medications: Lantus 70 units daily, NovoLog 16 units 3 times daily with meals, Invokamet 50- 1000 mg twice daily Patient notes that she is here for a 1 month diabetes recheck today.  She was last seen by Dr. Barbaraann Faster on 4/24 at which time A1c was 8.4.  She was noted to be noncompliant with Invokamet and was encouraged to improve adherence. Of additional note, Trulicity is currently listed on her medication list however she is not currently taking it.  I have sent a message to [place] to check into the status of patient assistance with this. Glucometer download - Average glucose is 242 over the past 31 days with the average of 1.4 test per day - Above target 58.1% of the time, within target range 41.9% of the time, did not fall below target range.  Plan - Unfortunately, its difficult to make any medication changes at her visit today since she is not checking her sugars 3 times a day and she is on mealtime NovoLog.  I reviewed the importance of checking her blood sugars appropriately which will allow [place] to assist her in making appropriate insulin changes.  Of additional note, she  is taking her NovoLog at the time of her meals.  I educated her on being sure to take it 15 minutes prior to eating but to be sure that she does eat after taking it. - It does appear that she picked up the Invokamet prescription after her visit with Dr. Barbaraann Faster in April.  We will have her continue taking this. - I have sent a message to [place] to see where she is at in the process of obtaining patient assistance for Trulicity.  Alternatively, if this is unable to be obtained, Victoza, which is on the charity program list at the outpatient pharmacy, could be utilized. - I have asked her to return in 2 weeks again when she has been checking her blood sugars appropriately so that we can really download her meter and make the necessary adjustments to her regimen. - She is due for diabetic retinopathy screening however she is uninsured and does not wish to pay out-of-pocket for this at this time.      Health care maintenance (Chronic)    She requests referral to dentistry at today's appointment.      Follow-up in 2 weeks for meter download with medication management - Follow-up patient assistance for Trulicity with [name]   Pt discussed with Dr. Johny Shock, MD Internal Medicine Resident PGY-3 Robin Klein  [facility] Internal Medicine Residency 05/23/2021 10:40 AM

## 2021-05-23 NOTE — Assessment & Plan Note (Signed)
She requests referral to dentistry at today's appointment.

## 2021-05-23 NOTE — Assessment & Plan Note (Signed)
Current medications: Lantus 70 units daily, NovoLog 16 units 3 times daily with meals, Invokamet 50- 1000 mg twice daily Patient notes that she is here for a 1 month diabetes recheck today.  She was last seen by Dr. Barbaraann Faster on 4/24 at which time A1c was 8.4.  She was noted to be noncompliant with Invokamet and was encouraged to improve adherence. Of additional note, Trulicity is currently listed on her medication list however she is not currently taking it.  I have sent a message to [place] to check into the status of patient assistance with this. Glucometer download - Average glucose is 242 over the past 31 days with the average of 1.4 test per day - Above target 58.1% of the time, within target range 41.9% of the time, did not fall below target range.  Plan - Unfortunately, its difficult to make any medication changes at her visit today since she is not checking her sugars 3 times a day and she is on mealtime NovoLog.  I reviewed the importance of checking her blood sugars appropriately which will allow [place] to assist her in making appropriate insulin changes.  Of additional note, she is taking her NovoLog at the time of her meals.  I educated her on being sure to take it 15 minutes prior to eating but to be sure that she does eat after taking it. - It does appear that she picked up the Invokamet prescription after her visit with Dr. Barbaraann Faster in April.  We will have her continue taking this. - I have sent a message to [place] to see where she is at in the process of obtaining patient assistance for Trulicity.  Alternatively, if this is unable to be obtained, Victoza, which is on the charity program list at the outpatient pharmacy, could be utilized. - I have asked her to return in 2 weeks again when she has been checking her blood sugars appropriately so that we can really download her meter and make the necessary adjustments to her regimen. - She is due for diabetic retinopathy screening however she is  uninsured and does not wish to pay out-of-pocket for this at this time.

## 2021-05-26 NOTE — Progress Notes (Signed)
Internal Medicine Clinic Attending  Case discussed with Dr. Christian  At the time of the visit.  We reviewed the resident's history and exam and pertinent patient test results.  I agree with the assessment, diagnosis, and plan of care documented in the resident's note.  

## 2021-05-27 ENCOUNTER — Other Ambulatory Visit (HOSPITAL_COMMUNITY): Payer: Self-pay

## 2021-06-06 ENCOUNTER — Other Ambulatory Visit: Payer: Self-pay

## 2021-06-06 ENCOUNTER — Other Ambulatory Visit (HOSPITAL_COMMUNITY): Payer: Self-pay

## 2021-06-06 ENCOUNTER — Ambulatory Visit: Payer: Self-pay | Admitting: Student

## 2021-06-06 ENCOUNTER — Encounter: Payer: Self-pay | Admitting: Student

## 2021-06-06 DIAGNOSIS — Z0000 Encounter for general adult medical examination without abnormal findings: Secondary | ICD-10-CM

## 2021-06-06 DIAGNOSIS — Z794 Long term (current) use of insulin: Secondary | ICD-10-CM

## 2021-06-06 DIAGNOSIS — E119 Type 2 diabetes mellitus without complications: Secondary | ICD-10-CM

## 2021-06-06 DIAGNOSIS — I10 Essential (primary) hypertension: Secondary | ICD-10-CM

## 2021-06-06 MED ORDER — INVOKAMET 50-1000 MG PO TABS
1.0000 | ORAL_TABLET | Freq: Two times a day (BID) | ORAL | 1 refills | Status: DC
  Filled 2021-06-06: qty 60, 30d supply, fill #0
  Filled 2021-10-24: qty 60, 30d supply, fill #1

## 2021-06-06 MED ORDER — TRULICITY 4.5 MG/0.5ML ~~LOC~~ SOAJ
4.5000 mg | SUBCUTANEOUS | 2 refills | Status: DC
  Filled 2021-06-06: qty 2, 28d supply, fill #0

## 2021-06-06 NOTE — Progress Notes (Signed)
   CC: f/u T2DM  HPI:  [name] is a 51 y.o. female with history listed below presenting to the [facility] for f/u of T2DM. Please see individualized problem based charting for full HPI.  Past Medical History:  Diagnosis Date   Depression    Diabetes mellitus    Fatty liver disease, nonalcoholic    Hypertension    Sinusitis     Review of Systems:  Negative aside from that listed in individualized problem based charting.  Physical Exam:  Vitals:   06/06/21 1058  BP: 117/71  Pulse: 91  Resp: (!) 28  Temp: 98.5 F (36.9 C)  TempSrc: Oral  SpO2: 99%  Weight: 153 lb 1.6 oz (69.4 kg)  Height: 5' (1.524 m)   Physical Exam Constitutional:      Appearance: She is obese. She is not ill-appearing.  HENT:     Nose: Nose normal. No congestion.     Mouth/Throat:     Mouth: Mucous membranes are moist.     Pharynx: Oropharynx is clear.  Eyes:     Extraocular Movements: Extraocular movements intact.     Conjunctiva/sclera: Conjunctivae normal.     Pupils: Pupils are equal, round, and reactive to light.  Cardiovascular:     Rate and Rhythm: Normal rate and regular rhythm.     Pulses: Normal pulses.     Heart sounds: Normal heart sounds. No murmur heard.   No gallop.  Pulmonary:     Effort: Pulmonary effort is normal.     Breath sounds: Normal breath sounds. No wheezing, rhonchi or rales.  Abdominal:     General: Bowel sounds are normal. There is no distension.     Palpations: Abdomen is soft.     Tenderness: There is no abdominal tenderness.  Musculoskeletal:        General: Normal range of motion.  Skin:    General: Skin is warm and dry.  Neurological:     General: No focal deficit present.     Mental Status: She is alert and oriented to person, place, and time.  Psychiatric:        Mood and Affect: Mood normal.        Behavior: Behavior normal.     Assessment & Plan:   See Encounters Tab for problem based charting.  Patient discussed with Dr.   Lafonda Mosses

## 2021-06-06 NOTE — Assessment & Plan Note (Signed)
Patient is currently uninsured. Provided pamphlet to [facility] for PAP smear.

## 2021-06-06 NOTE — Patient Instructions (Addendum)
Carlena Hurl,  Fue un placer verte en la clnica hoy.   1. Por favor, escriba sus lecturas de azcar en casa en papel y trigalo con usted a la prxima visita. 2. Por favor, vuelve en 1 semana para verme a m y a Lupita Leash.  Llame a nuestra clnica al [phone] si tiene alguna pregunta o inquietud. El mejor momento para llamar es de lunes a viernes de 9 a.m. a 4 p.m., pero hay alguien disponible las 24 horas, los 7 [phone]  [phone] de la semana en el mismo nmero. Si necesita resurtir [employer], notifique a su farmacia con una semana de anticipacin y nos enviarn una solicitud.   Gracias por permitirnos participar en su cuidado. Esperamos verte la prxima vez!

## 2021-06-06 NOTE — Progress Notes (Signed)
Internal Medicine Clinic Attending  Case discussed with Dr. Jinwala  At the time of the visit.  We reviewed the resident's history and exam and pertinent patient test results.  I agree with the assessment, diagnosis, and plan of care documented in the resident's note.  

## 2021-06-06 NOTE — Assessment & Plan Note (Signed)
Patient with history of T2DM, last A1c of 8.4% on 04/2021. She is currently taking trulicity, invokamet, novolog 16u TID, and lantus 70u daily. Glucometer readings since her last visit showing highs in the 400s but patient reports that she has not seen any blood glucose readings >250 in the past few months. She reports that Lupita Leash mentioned that her glucometer may not be reading accurately and that she would fix it, but unsure if she did. Unfortunately, Lupita Leash is out of office today. Patient reports adherence with all of her medications. At this time, will not make any medication changes. Instead, advised patient to write down home readings with date/time and bring it to next visit in 1 week to ensure accuracy of glucometer readings. Will also have her see Lupita Leash at that time.  Plan: -continue invokamet, trulicity, lantus 70u daily, novolog 16u TID -f/u in 1 week -bring home glucose log to next visit -see Lupita Leash

## 2021-06-06 NOTE — Assessment & Plan Note (Signed)
Patient with history of HTN, on norvasc 10mg  daily. BP well controlled today (117/71). Will continue current therapy.

## 2021-06-16 ENCOUNTER — Ambulatory Visit (INDEPENDENT_AMBULATORY_CARE_PROVIDER_SITE_OTHER): Payer: Self-pay | Admitting: Student

## 2021-06-16 VITALS — BP 137/79 | HR 86 | Temp 98.3°F | Ht 60.0 in | Wt 155.5 lb

## 2021-06-16 DIAGNOSIS — Z0000 Encounter for general adult medical examination without abnormal findings: Secondary | ICD-10-CM

## 2021-06-16 DIAGNOSIS — E119 Type 2 diabetes mellitus without complications: Secondary | ICD-10-CM

## 2021-06-16 DIAGNOSIS — Z7689 Persons encountering health services in other specified circumstances: Secondary | ICD-10-CM

## 2021-06-16 DIAGNOSIS — Z794 Long term (current) use of insulin: Secondary | ICD-10-CM

## 2021-06-16 NOTE — Assessment & Plan Note (Signed)
Patient requesting referral to dentistry for evaluation and cleaning. She has her [facility] card until October 2023 and thus will place this today.

## 2021-06-16 NOTE — Patient Instructions (Addendum)
Robin Klein,  Fue un placer verte en la clnica hoy.   1. Por favor, asegrese de tomar su Lantus todas las noches a la [employer].  2. Por favor, tome sus otros medicamentos para la diabetes como lo ha hecho. 3. He colocado una referencia para su examen de la vista. Podremos hacerlo aqu como el ao pasado. 4. He colocado una referencia al [employer]. Ellos lo llamarn para programar esta cita. 5. Por favor, regrese en 2 meses para su prximo cheque de azcar de 3 meses.  Llame a nuestra clnica al [phone] si tiene alguna pregunta o inquietud. El mejor momento para llamar es de lunes a viernes de 9 a.m. a 4 p.m., pero hay alguien disponible las 24 horas, los 7 das de la semana en el mismo nmero. Si necesita resurtir [employer], notifique a su farmacia con una semana de anticipacin y nos enviarn una solicitud.   Gracias por permitirnos participar en su cuidado. Esperamos verte la prxima vez!

## 2021-06-16 NOTE — Progress Notes (Signed)
   CC: f/u T2DM  HPI:  [name] is a 51 y.o. female with history listed below presenting to the [facility] for f/u T2DM. Please see individualized problem based charting for full HPI.  Past Medical History:  Diagnosis Date   Depression    Diabetes mellitus    Fatty liver disease, nonalcoholic    Hypertension    Sinusitis     Review of Systems:  Negative aside from that listed in individualized problem based charting.  Physical Exam:  Vitals:   06/16/21 1529  BP: 137/79  Pulse: 86  Temp: 98.3 F (36.8 C)  TempSrc: Oral  SpO2: 100%  Weight: 155 lb 8 oz (70.5 kg)  Height: 5' (1.524 m)   Physical Exam Constitutional:      Appearance: Normal appearance. She is obese.  HENT:     Mouth/Throat:     Mouth: Mucous membranes are moist.     Pharynx: Oropharynx is clear.  Eyes:     Extraocular Movements: Extraocular movements intact.     Conjunctiva/sclera: Conjunctivae normal.     Pupils: Pupils are equal, round, and reactive to light.  Cardiovascular:     Rate and Rhythm: Normal rate and regular rhythm.     Pulses: Normal pulses.     Heart sounds: Normal heart sounds. No murmur heard.    No gallop.  Pulmonary:     Effort: Pulmonary effort is normal.     Breath sounds: Normal breath sounds. No wheezing, rhonchi or rales.  Abdominal:     General: Bowel sounds are normal. There is no distension.     Palpations: Abdomen is soft.     Tenderness: There is no abdominal tenderness.  Musculoskeletal:        General: No swelling. Normal range of motion.  Skin:    General: Skin is warm and dry.  Neurological:     General: No focal deficit present.     Mental Status: She is alert and oriented to person, place, and time.  Psychiatric:        Mood and Affect: Mood normal.        Behavior: Behavior normal.      Assessment & Plan:   See Encounters Tab for problem based charting.  Patient discussed with Dr. Mikey Bussing

## 2021-06-16 NOTE — Assessment & Plan Note (Signed)
Patient with history of uncontrolled T2DM, last A1c 8.4% on 04/2021. She is currently taking trulicity, invokamet, novolog 16u TID, and lantus 70u daily. Patient brought in self-recorded blood glucose log and we also reviewed her 1 month CGM readings. Fortunately, patient has not had any lows during this time. She is staying at target 51% of the time and above target 49% of the time. Upon reviewing her readings, it appears that her glucose levels are poorly controlled in the mornings between 8-10AM, likely reflecting inappropriate adherence to lantus. She does endorse that she likely misses her lantus dose about 2-3 times a week which reflects her CGM readings. When she does take her lantus, her AM glucose values are at goal, which I showed to her to help her understand the importance of nightly lantus use. She confirms understanding.  Plan: -continue invokamet, trulicity, novolog 16u TID -ensure nightly adherence to lantus 70u qhs -f/u in 2 months for repeat A1c -placed referral for diabetic eye exam

## 2021-06-18 NOTE — Progress Notes (Signed)
Internal Medicine Clinic Attending  Case discussed with the resident at the time of the visit.  We reviewed the resident's history and exam and pertinent patient test results.  I agree with the assessment, diagnosis, and plan of care documented in the resident's note.  

## 2021-06-30 ENCOUNTER — Other Ambulatory Visit (HOSPITAL_COMMUNITY): Payer: Self-pay

## 2021-08-04 ENCOUNTER — Other Ambulatory Visit: Payer: Self-pay | Admitting: Internal Medicine

## 2021-08-04 ENCOUNTER — Other Ambulatory Visit (HOSPITAL_COMMUNITY): Payer: Self-pay

## 2021-08-04 MED ORDER — NOVOLOG FLEXPEN 100 UNIT/ML ~~LOC~~ SOPN
16.0000 [IU] | PEN_INJECTOR | Freq: Three times a day (TID) | SUBCUTANEOUS | 12 refills | Status: DC
Start: 2021-08-04 — End: 2022-05-19
  Filled 2021-08-04: qty 15, 30d supply, fill #0
  Filled 2021-11-21: qty 15, 30d supply, fill #1
  Filled 2022-02-13: qty 15, 30d supply, fill #2
  Filled 2022-05-18: qty 15, 30d supply, fill #3

## 2021-08-05 ENCOUNTER — Other Ambulatory Visit (HOSPITAL_COMMUNITY): Payer: Self-pay

## 2021-08-28 ENCOUNTER — Other Ambulatory Visit: Payer: Self-pay | Admitting: Internal Medicine

## 2021-08-28 ENCOUNTER — Other Ambulatory Visit (HOSPITAL_COMMUNITY): Payer: Self-pay

## 2021-08-28 DIAGNOSIS — E1165 Type 2 diabetes mellitus with hyperglycemia: Secondary | ICD-10-CM

## 2021-08-28 MED ORDER — CONTOUR NEXT TEST VI STRP
ORAL_STRIP | 12 refills | Status: DC
  Filled 2021-08-28: qty 100, 25d supply, fill #0
  Filled 2021-11-21: qty 100, 25d supply, fill #1
  Filled 2022-02-13: qty 100, 25d supply, fill #2
  Filled 2022-06-11: qty 100, 25d supply, fill #3
  Filled 2022-08-13: qty 100, 25d supply, fill #4

## 2021-08-28 MED ORDER — UNIFINE PENTIPS 31G X 5 MM MISC
11 refills | Status: DC
  Filled 2021-08-28: qty 100, 25d supply, fill #0
  Filled 2021-10-10: qty 100, 25d supply, fill #1
  Filled 2021-12-19: qty 100, 25d supply, fill #2
  Filled 2022-03-26: qty 100, 25d supply, fill #3
  Filled 2022-05-18: qty 100, 25d supply, fill #4
  Filled 2022-06-26: qty 100, 25d supply, fill #5
  Filled 2022-07-30: qty 100, 25d supply, fill #6

## 2021-08-29 ENCOUNTER — Other Ambulatory Visit (HOSPITAL_COMMUNITY): Payer: Self-pay

## 2021-09-09 ENCOUNTER — Other Ambulatory Visit (HOSPITAL_COMMUNITY): Payer: Self-pay

## 2021-10-10 ENCOUNTER — Other Ambulatory Visit (HOSPITAL_COMMUNITY): Payer: Self-pay

## 2021-10-13 ENCOUNTER — Ambulatory Visit: Payer: Self-pay | Admitting: Internal Medicine

## 2021-10-13 VITALS — BP 116/78 | HR 74 | Temp 98.4°F | Wt 153.0 lb

## 2021-10-13 DIAGNOSIS — E119 Type 2 diabetes mellitus without complications: Secondary | ICD-10-CM

## 2021-10-13 DIAGNOSIS — Z23 Encounter for immunization: Secondary | ICD-10-CM

## 2021-10-13 DIAGNOSIS — Z794 Long term (current) use of insulin: Secondary | ICD-10-CM

## 2021-10-13 DIAGNOSIS — M545 Low back pain, unspecified: Secondary | ICD-10-CM

## 2021-10-13 DIAGNOSIS — Z0000 Encounter for general adult medical examination without abnormal findings: Secondary | ICD-10-CM

## 2021-10-13 LAB — POCT GLYCOSYLATED HEMOGLOBIN (HGB A1C): Hemoglobin A1C: 10 % — AB (ref 4.0–5.6)

## 2021-10-13 LAB — GLUCOSE, CAPILLARY: Glucose-Capillary: 125 mg/dL — ABNORMAL HIGH (ref 70–99)

## 2021-10-13 NOTE — Progress Notes (Unsigned)
   CC: diabetes  HPI:[name] is a 51 y.o. female who presents for evaluation of diabetes. Please see individual problem based A/P for details.  Not been checking regularly/ Reports she misses Invokamet half the time. Regularly skips novolog doses. She reports that she will start eating and forgt to take it.  Does not routinely take the metformin because it is large. Reports that she has not been taking trulicity because she was not able to pick it up.   Patient reports new onset pain after fall. Pain located in lower posterior ribs. Has been using tylenol without relief. Massages not helping either. No numbness or tingling. No weakness. No change in urination. No trouble moving hip.  Depression, PHQ-9: Based on the patients  [place] Visit from 11/12/2020 in [place]  PHQ-9 Total Score 0      score we have .  Past Medical History:  Diagnosis Date   Depression    Diabetes mellitus    Fatty liver disease, nonalcoholic    Hypertension    Sinusitis    Review of Systems:   See HPI  Physical Exam: Vitals:   10/13/21 0859  BP: 116/78  Pulse: 74  Temp: 98.4 F (36.9 C)  TempSrc: Oral  Weight: 153 lb (69.4 kg)   General: NAD HEENT: Conjunctiva nl , antiicteric sclerae, moist mucous membranes, no exudate or erythema Cardiovascular: Normal rate, regular rhythm.  No murmurs, rubs, or gallops Pulmonary : Equal breath sounds, No wheezes, rales, or rhonchi Abdominal: soft, nontender,  bowel sounds present Ext: No edema in lower extremities, no tenderness to palpation of lower extremities.  MSK: Tenderness with palpation in right posterior lower ribs. No ecchymosis or edema.  Assessment & Plan:   See Encounters Tab for problem based charting.  A1c 10. Control worsened. Patient having difficulty understanding and adhering to her regimen.  Will not make adjustments to regimen at this time discussed when and how often to take  medicines.  - referral to diabetic education.  - ophtho referral placed previously.   msk pain from trauma. No evidence of displaced fracture. Low concern for damage to deeper structures. No evidence of hematoma.  - ice, heat, ibuprofen.  Patient discussed with Dr. Philipp Ovens

## 2021-10-13 NOTE — Patient Instructions (Addendum)
Dear Mrs. Robin Klein,  Thank you for trusting [place] with your care. We discussed your diabetes. Please start checking your blood sugar more frequently and record it. Please keep taking your Trulicity, Invokamet, Novolog, and Lantus. I would like for you to speak with our diabetes educator to help get control over your diabetes. We would also like for you the see the eye doctor for an eye exam. Diabetes can affect our vision over time.  For your back pain, please take Ibuprofen, ice, and heat.   I have placed a referral to the stomach doctors for a screening colonoscopy.   Gracias por confiar en nosotros con su atencin. Hablamos de su diabetes. Comience a controlar su nivel de azcar en la sangre con ms frecuencia y regstrelo. Por favor, sigue tomando tu Trulicity, Invokamet, Novolog y Lantus. Me gustara que hablara con nuestro educador en diabetes para ayudarlo a controlar su diabetes. Tambin nos gustara que viera al oftalmlogo para un examen de la vista. La diabetes puede afectar nuestra visin con [employer].  Para su dolor de espalda, tome ibuprofeno, hielo y calor.  He hecho una derivacin a los mdicos del estmago para una colonoscopia de deteccin.

## 2021-10-15 ENCOUNTER — Encounter: Payer: Self-pay | Admitting: Internal Medicine

## 2021-10-15 NOTE — Assessment & Plan Note (Addendum)
Declines flu. Administered tetanus shot. GI referral for colonoscopy.

## 2021-10-15 NOTE — Assessment & Plan Note (Addendum)
Not been checking regularly/ Reports she misses Invokamet half the time. Regularly skips novolog doses. She reports that she will start eating and forgt to take it.  Does not routinely take the metformin because it is large. Reports that she has not been taking trulicity because she was not able to pick it up.  Discussed a1c and cbg with patient.   A1c 10. Control worsened. Patient having difficulty understanding and adhering to her regimen.  Will not make adjustments to regimen at this time discussed when and how often to take medicines.  - referral to diabetic education.  - ophtho referral placed previously.

## 2021-10-15 NOTE — Assessment & Plan Note (Signed)
Patient reports new onset  Sharp pain after fall. Pain located in lower posterior ribs. Has been using tylenol without relief. Massages not helping either. No numbness or tingling. No weakness. No change in urination. No trouble moving hip. No trouble breathing.  Tenderness with palpation in right posterior lower ribs. No ecchymosis or edema.  msk pain from trauma. No evidence of displaced fracture. Low concern for damage to deeper structures. No evidence of hematoma.  - ice, heat, ibuprofen.

## 2021-10-17 NOTE — Progress Notes (Signed)
Internal Medicine Clinic Attending ° °Case discussed with Dr. Gawaluck  At the time of the visit.  We reviewed the resident’s history and exam and pertinent patient test results.  I agree with the assessment, diagnosis, and plan of care documented in the resident’s note.  °

## 2021-10-24 ENCOUNTER — Other Ambulatory Visit (HOSPITAL_COMMUNITY): Payer: Self-pay

## 2021-10-31 NOTE — Telephone Encounter (Signed)
ERROR

## 2021-11-04 ENCOUNTER — Other Ambulatory Visit (HOSPITAL_COMMUNITY): Payer: Self-pay

## 2021-11-05 ENCOUNTER — Other Ambulatory Visit (HOSPITAL_COMMUNITY): Payer: Self-pay

## 2021-11-13 NOTE — Progress Notes (Deleted)
T2dm A1c 10.0 on 10/9 Canaglaflozin metformin 50-1000 BID Trulicity 4.5 weekly Glargine 70u nightly Aspart 16u TID HTN Norvasc 10  NAFLD Atorvastatin 40  HCM Optho  Pap Colonoscopy Microalbumin Flu Lipid(Ldl 87 11/2020)

## 2021-11-18 NOTE — Congregational Nurse Program (Signed)
  Dept: [phone]   Congregational Nurse Program Note  Date of Encounter: 11/18/2021  Past Medical History: Past Medical History:  Diagnosis Date   Depression    Diabetes mellitus    Fatty liver disease, nonalcoholic    Hypertension    Sinusitis     Encounter Details:  CNP Questionnaire - 11/18/21 1532       Questionnaire   Ask client: Do you give verbal consent for me to treat you today? Yes    Student Assistance [employer]    Location Patient Presenter, [place]    Visit Setting with Client Organization    Patient Status Unknown    Insurance Uninsured ([employer] Card/Care Connects/Self-Pay/Medicaid Family Planning)    Insurance/Financial Assistance Referral N/A    Medication Have Medication Insecurities    Medical Provider Yes    Screening Referrals Made Annual Wellness Visit    Medical Referrals Made [facility] P[facility]    Medical Appointment Made N/A    Recently w/o PCP, now 1st time PCP visit completed due to CNs referral or appointment made N/A    Food N/A    Transportation N/A    Housing/Utilities N/A    Interpersonal Safety N/A    Interventions [employer] [facility];Case Management    Abnormal to Normal Screening Since Last CN Visit N/A    Screenings CN Performed N/A    Sent Client to Lab for: N/A    Did client attend any of the following based off CNs referral or appointments made? N/A    Life-Saving Intervention Made N/A             Patient seen for orange card application renewal, application has been completed and submitted through email to orange card email. Patient states they need to see her PCP for an annual appt and has a dental appt with [facility] on 12/09/2021. Medications reviewed.

## 2021-11-21 ENCOUNTER — Other Ambulatory Visit (HOSPITAL_COMMUNITY): Payer: Self-pay

## 2021-11-28 ENCOUNTER — Other Ambulatory Visit (HOSPITAL_COMMUNITY): Payer: Self-pay

## 2021-11-28 ENCOUNTER — Other Ambulatory Visit: Payer: Self-pay

## 2021-12-08 ENCOUNTER — Other Ambulatory Visit (HOSPITAL_COMMUNITY): Payer: Self-pay

## 2021-12-08 ENCOUNTER — Other Ambulatory Visit: Payer: Self-pay | Admitting: *Deleted

## 2021-12-08 DIAGNOSIS — E119 Type 2 diabetes mellitus without complications: Secondary | ICD-10-CM

## 2021-12-08 DIAGNOSIS — I10 Essential (primary) hypertension: Secondary | ICD-10-CM

## 2021-12-08 NOTE — Telephone Encounter (Signed)
Pt presented to the front desk inquiring about refill on Metformin and Amlodipine. Per chart, I do not see any requests from the pharmacy. Pt stated it has been 2 -3 weeks ago. Pt has an appt on 12/11 - instructed pt to keep this appt - stated she will.

## 2021-12-09 ENCOUNTER — Other Ambulatory Visit (HOSPITAL_COMMUNITY): Payer: Self-pay

## 2021-12-09 MED ORDER — AMOXICILLIN 500 MG PO CAPS
500.0000 mg | ORAL_CAPSULE | Freq: Three times a day (TID) | ORAL | 0 refills | Status: DC
  Filled 2021-12-09: qty 30, 10d supply, fill #0

## 2021-12-10 ENCOUNTER — Other Ambulatory Visit (HOSPITAL_COMMUNITY): Payer: Self-pay

## 2021-12-10 MED ORDER — INVOKAMET 50-1000 MG PO TABS
1.0000 | ORAL_TABLET | Freq: Two times a day (BID) | ORAL | 1 refills | Status: DC
  Filled 2021-12-10: qty 60, 30d supply, fill #0
  Filled 2022-02-13: qty 60, 30d supply, fill #1

## 2021-12-10 MED ORDER — AMLODIPINE BESYLATE 10 MG PO TABS
10.0000 mg | ORAL_TABLET | Freq: Every day | ORAL | 1 refills | Status: DC
  Filled 2021-12-10: qty 30, 30d supply, fill #0
  Filled 2022-01-13: qty 30, 30d supply, fill #1
  Filled 2022-02-09: qty 30, 30d supply, fill #2
  Filled 2022-03-26: qty 30, 30d supply, fill #3
  Filled 2022-04-29: qty 30, 30d supply, fill #4
  Filled 2022-06-11: qty 30, 30d supply, fill #5

## 2021-12-11 ENCOUNTER — Encounter: Payer: Self-pay | Admitting: Student

## 2021-12-15 ENCOUNTER — Telehealth: Payer: Self-pay | Admitting: *Deleted

## 2021-12-15 ENCOUNTER — Encounter: Payer: Self-pay | Admitting: Student

## 2021-12-15 NOTE — Telephone Encounter (Signed)
Called patient/ left voice message for patient regarding her missed appointment/ patient will need to call to reschedule by call the main number at [phone].Marland Kitchen Marland Kitchen

## 2021-12-19 ENCOUNTER — Other Ambulatory Visit (HOSPITAL_COMMUNITY): Payer: Self-pay

## 2022-01-13 ENCOUNTER — Other Ambulatory Visit (HOSPITAL_COMMUNITY): Payer: Self-pay

## 2022-01-22 ENCOUNTER — Other Ambulatory Visit (HOSPITAL_COMMUNITY): Payer: Self-pay

## 2022-02-02 ENCOUNTER — Other Ambulatory Visit (HOSPITAL_COMMUNITY): Payer: Self-pay

## 2022-02-06 ENCOUNTER — Emergency Department (HOSPITAL_COMMUNITY): Payer: Self-pay

## 2022-02-06 ENCOUNTER — Emergency Department (HOSPITAL_COMMUNITY)
Admission: EM | Admit: 2022-02-06 | Discharge: 2022-02-06 | Disposition: A | Discharge status: Home / Self Care | Payer: Self-pay | Attending: Emergency Medicine | Admitting: Emergency Medicine

## 2022-02-06 DIAGNOSIS — S299XXA Unspecified injury of thorax, initial encounter: Secondary | ICD-10-CM | POA: Insufficient documentation

## 2022-02-06 DIAGNOSIS — E119 Type 2 diabetes mellitus without complications: Secondary | ICD-10-CM | POA: Diagnosis not present

## 2022-02-06 DIAGNOSIS — M25521 Pain in right elbow: Secondary | ICD-10-CM | POA: Insufficient documentation

## 2022-02-06 DIAGNOSIS — S0990XA Unspecified injury of head, initial encounter: Secondary | ICD-10-CM | POA: Insufficient documentation

## 2022-02-06 DIAGNOSIS — Y92410 Unspecified street and highway as the place of occurrence of the external cause: Secondary | ICD-10-CM | POA: Diagnosis not present

## 2022-02-06 DIAGNOSIS — Z79899 Other long term (current) drug therapy: Secondary | ICD-10-CM | POA: Diagnosis not present

## 2022-02-06 DIAGNOSIS — S3991XA Unspecified injury of abdomen, initial encounter: Secondary | ICD-10-CM | POA: Insufficient documentation

## 2022-02-06 DIAGNOSIS — Z794 Long term (current) use of insulin: Secondary | ICD-10-CM | POA: Insufficient documentation

## 2022-02-06 DIAGNOSIS — Z7984 Long term (current) use of oral hypoglycemic drugs: Secondary | ICD-10-CM | POA: Insufficient documentation

## 2022-02-06 DIAGNOSIS — I10 Essential (primary) hypertension: Secondary | ICD-10-CM | POA: Diagnosis not present

## 2022-02-06 DIAGNOSIS — M542 Cervicalgia: Secondary | ICD-10-CM | POA: Insufficient documentation

## 2022-02-06 LAB — I-STAT CHEM 8, ED
BUN: 19 mg/dL (ref 6–20)
Calcium, Ion: 1.17 mmol/L (ref 1.15–1.40)
Chloride: 102 mmol/L (ref 98–111)
Creatinine, Ser: 0.4 mg/dL — ABNORMAL LOW (ref 0.44–1.00)
Glucose, Bld: 331 mg/dL — ABNORMAL HIGH (ref 70–99)
HCT: 44 % (ref 36.0–46.0)
Hemoglobin: 15 g/dL (ref 12.0–15.0)
Potassium: 3.8 mmol/L (ref 3.5–5.1)
Sodium: 138 mmol/L (ref 135–145)
TCO2: 25 mmol/L (ref 22–32)

## 2022-02-06 LAB — I-STAT BETA HCG BLOOD, ED (MC, WL, AP ONLY): I-stat hCG, quantitative: <5 m[IU]/mL (ref <5)

## 2022-02-06 MED ORDER — IOHEXOL 350 MG/ML SOLN
75.0000 mL | Freq: Once | INTRAVENOUS | Status: AC | PRN
  Administered 2022-02-06: 75 mL via INTRAVENOUS

## 2022-02-06 MED ORDER — MORPHINE SULFATE (PF) 4 MG/ML IV SOLN
4.0000 mg | Freq: Once | INTRAVENOUS | Status: AC
  Administered 2022-02-06: 4 mg via INTRAVENOUS
  Filled 2022-02-06: qty 1

## 2022-02-06 MED ORDER — ONDANSETRON HCL 4 MG/2ML IJ SOLN
4.0000 mg | Freq: Once | INTRAMUSCULAR | Status: AC
  Administered 2022-02-06: 4 mg via INTRAVENOUS
  Filled 2022-02-06: qty 2

## 2022-02-06 MED ORDER — NAPROXEN 375 MG PO TABS: 375.0000 mg | ORAL_TABLET | Freq: Two times a day (BID) | ORAL | 0 refills | Status: DC

## 2022-02-06 MED ORDER — METHOCARBAMOL 500 MG PO TABS: 500.0000 mg | ORAL_TABLET | Freq: Two times a day (BID) | ORAL | 0 refills | Status: DC

## 2022-02-06 MED ORDER — LIDOCAINE 5 % EX PTCH: 1.0000 | MEDICATED_PATCH | CUTANEOUS | 0 refills | Status: DC

## 2022-02-06 NOTE — Discharge Instructions (Signed)
Your workup today is overall reassuring.  All of the scans anything concerning.  No fractures or internal injury.  Your blood sugar was slightly elevated at 331.  Continue taking your home diabetic medicines.  Keep a close eye over your blood sugar and if it remains elevated follow-up with your primary care provider.  I have sent muscle relaxer which is called Robaxin and can make you drowsy so do not drive or do anything dangerous after taking this medicine.  We have also sent and an anti-inflammatory called naproxen, and lidocaine patch.  In addition to all of this you can take 1000 mg of Tylenol every 8 hours.  If any concerning symptoms or concerns return to the emergency department otherwise follow-up with your primary care provider.
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
## 2022-02-06 NOTE — ED Notes (Signed)
Patient transported to CT 
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
## 2022-02-06 NOTE — ED Notes (Signed)
Ccollar removed by [place] PA

## 2022-02-09 ENCOUNTER — Other Ambulatory Visit (HOSPITAL_COMMUNITY): Payer: Self-pay

## 2022-02-09 MED ORDER — METHOCARBAMOL 500 MG PO TABS
500.0000 mg | ORAL_TABLET | Freq: Two times a day (BID) | ORAL | 0 refills | Status: DC
  Filled 2022-02-09: qty 20, 10d supply, fill #0

## 2022-02-09 MED ORDER — LIDOCAINE 5 % EX PTCH
1.0000 | MEDICATED_PATCH | Freq: Every day | CUTANEOUS | 0 refills | Status: DC
  Filled 2022-02-09: qty 30, 30d supply, fill #0

## 2022-02-09 MED ORDER — NAPROXEN 375 MG PO TABS
375.0000 mg | ORAL_TABLET | Freq: Two times a day (BID) | ORAL | 0 refills | Status: DC
  Filled 2022-02-09: qty 20, 10d supply, fill #0

## 2022-02-13 ENCOUNTER — Ambulatory Visit (INDEPENDENT_AMBULATORY_CARE_PROVIDER_SITE_OTHER): Payer: Self-pay | Admitting: Internal Medicine

## 2022-02-13 ENCOUNTER — Other Ambulatory Visit (HOSPITAL_COMMUNITY): Payer: Self-pay

## 2022-02-13 ENCOUNTER — Encounter: Payer: Self-pay | Admitting: Internal Medicine

## 2022-02-13 VITALS — BP 132/80 | HR 81 | Temp 98.1°F | Ht 60.0 in | Wt 156.6 lb

## 2022-02-13 DIAGNOSIS — E119 Type 2 diabetes mellitus without complications: Secondary | ICD-10-CM

## 2022-02-13 DIAGNOSIS — E1165 Type 2 diabetes mellitus with hyperglycemia: Secondary | ICD-10-CM

## 2022-02-13 DIAGNOSIS — Z794 Long term (current) use of insulin: Secondary | ICD-10-CM

## 2022-02-13 LAB — POCT GLYCOSYLATED HEMOGLOBIN (HGB A1C): Hemoglobin A1C: 10.6 % — AB (ref 4.0–5.6)

## 2022-02-13 LAB — GLUCOSE, CAPILLARY: Glucose-Capillary: 178 mg/dL — ABNORMAL HIGH (ref 70–99)

## 2022-02-13 NOTE — Progress Notes (Unsigned)
   CC: htn, DM  HPI:[name] is a 52 y.o. female who presents for evaluation of htn dm. Please see individual problem based A/P for details.  47 yof with hx htn, HAFLD, DM2, HLD   The 10-year ASCVD risk score (Arnett DK, et al., 2019) is: 3.8%   Values used to calculate the score:     Age: 41 years     Sex: Female     Is Non-Hispanic African American: No     Diabetic: Yes     Tobacco smoker: No     Systolic Blood Pressure: 161 mmHg     Is BP treated: Yes     HDL Cholesterol: 47 mg/dL     Total Cholesterol: 200 mg/dL  Depression, PHQ-9: Based on the patients  [place] Visit from 11/12/2020 in [place]  PHQ-9 Total Score 0      score we have .  Past Medical History:  Diagnosis Date   Depression    Diabetes mellitus    Fatty liver disease, nonalcoholic    Hypertension    Sinusitis    Review of Systems:   See hpi   Physical Exam: Vitals:   02/13/22 0831  BP: 132/80  Pulse: 81  Temp: 98.1 F (36.7 C)  TempSrc: Oral  SpO2: 98%  Weight: 156 lb 9.6 oz (71 kg)  Height: 5' (1.524 m)   General: nad HEENT: Conjunctiva nl , antiicteric sclerae, moist mucous membranes, no exudate or erythema Cardiovascular: Normal rate, regular rhythm.  No murmurs, rubs, or gallops Pulmonary : Equal breath sounds, No wheezes, rales, or rhonchi Abdominal: soft, nontender,  bowel sounds present Ext: No edema in lower extremities, no tenderness to palpation of lower extremities.   Assessment & Plan:   See Encounters Tab for problem based charting.  Patient discussed with Dr. Jimmye Norman

## 2022-02-13 NOTE — Patient Instructions (Addendum)
Dear Mrs. Robin Klein,  Thank you for trusting [place] with your care today. Please continue taking the Lantus 70 units daily. Please continue taking the Novolog 16 units with meals. Please take the Invokamet. Take 1/2 tablet once daily for one week, then 1/2 tablet in the morning and night for 1 week, then 1 tablet in the morning and 1/2 tablet at night, then 1 tablet in the morning and night. Please start the Trulicity. We will refer you to [facility]. Please call the [facility] to have the eye exam records sent to [place]. Please return in 1 month.   Gracias por confiarnos su atencin hoy. Contine tomando Lantus 70 unidades al da. Contine tomando Novolog 16 unidades con las comidas. Por favor tome el Invokamet. Tomar 1/2 comprimido una vez al da durante una [place], luego 1/2 comprimido por la maana y por la noche durante 1 [place], luego 1 comprimido por la maana y 1/2 comprimido por la noche, luego 1 comprimido por la maana y por la noche. Por favor, inicie Trulicity. Lo derivaremos a [facility]. Llame a [facility] para que nos enven los registros del examen de la vista. Por favor regrese en 1 mes.

## 2022-02-14 LAB — LIPID PANEL
Chol/HDL Ratio: 4.3 ratio (ref 0.0–4.4)
Cholesterol, Total: 200 mg/dL — ABNORMAL HIGH (ref 100–199)
HDL: 47 mg/dL (ref >39)
LDL Chol Calc (NIH): 119 mg/dL — ABNORMAL HIGH (ref 0–99)
Triglycerides: 192 mg/dL — ABNORMAL HIGH (ref 0–149)
VLDL Cholesterol Cal: 34 mg/dL (ref 5–40)

## 2022-02-14 LAB — BMP8+ANION GAP
Anion Gap: 19 mmol/L — ABNORMAL HIGH (ref 10.0–18.0)
BUN/Creatinine Ratio: 18 (ref 9–23)
BUN: 10 mg/dL (ref 6–24)
CO2: 21 mmol/L (ref 20–29)
Calcium: 9.4 mg/dL (ref 8.7–10.2)
Chloride: 102 mmol/L (ref 96–106)
Creatinine, Ser: 0.57 mg/dL (ref 0.57–1.00)
Glucose: 172 mg/dL — ABNORMAL HIGH (ref 70–99)
Potassium: 4 mmol/L (ref 3.5–5.2)
Sodium: 142 mmol/L (ref 134–144)
eGFR: 111 mL/min/{1.73_m2} (ref >59)

## 2022-02-15 LAB — MICROALBUMIN / CREATININE URINE RATIO
Creatinine, Urine: 47.6 mg/dL
Microalb/Creat Ratio: 22 mg/g creat (ref 0–29)
Microalbumin, Urine: 10.4 ug/mL

## 2022-02-15 NOTE — Assessment & Plan Note (Addendum)
Has not been taking her medications. Says that this is due to "laziness". Says she is deficienct with her invokamet most often. Going to use reminder to help her. She used to follow with [facility] DM edu and found her helpful. Wants to see her again. She says she went to [place] for eye exam in July and was told her eyes were fine . Checked lipid panel as well. 10 year ascvd low and does not need statin at this time.  DM with A1c 10.6, sstill unctonrolled.  We will continue with her Lantus daily and novlog with meals. Will need to re-uptitrate her invokamet and discussed this with her. Will refer back to [facility] will call [facility] to have record from eye exam sent to our office.

## 2022-02-16 ENCOUNTER — Other Ambulatory Visit (HOSPITAL_COMMUNITY): Payer: Self-pay

## 2022-02-19 NOTE — Progress Notes (Signed)
Internal Medicine Clinic Attending ° °Case discussed with Dr. Gawaluck  At the time of the visit.  We reviewed the resident’s history and exam and pertinent patient test results.  I agree with the assessment, diagnosis, and plan of care documented in the resident’s note.  °

## 2022-03-16 ENCOUNTER — Ambulatory Visit (INDEPENDENT_AMBULATORY_CARE_PROVIDER_SITE_OTHER): Payer: Self-pay | Admitting: Student

## 2022-03-16 ENCOUNTER — Other Ambulatory Visit (HOSPITAL_COMMUNITY): Payer: Self-pay

## 2022-03-16 VITALS — BP 138/82 | HR 92 | Temp 98.0°F | Ht 60.0 in | Wt 153.7 lb

## 2022-03-16 DIAGNOSIS — E782 Mixed hyperlipidemia: Secondary | ICD-10-CM

## 2022-03-16 DIAGNOSIS — Z7984 Long term (current) use of oral hypoglycemic drugs: Secondary | ICD-10-CM

## 2022-03-16 DIAGNOSIS — E785 Hyperlipidemia, unspecified: Secondary | ICD-10-CM

## 2022-03-16 DIAGNOSIS — E119 Type 2 diabetes mellitus without complications: Secondary | ICD-10-CM

## 2022-03-16 DIAGNOSIS — Z794 Long term (current) use of insulin: Secondary | ICD-10-CM

## 2022-03-16 DIAGNOSIS — I10 Essential (primary) hypertension: Secondary | ICD-10-CM

## 2022-03-16 MED ORDER — ATORVASTATIN CALCIUM 20 MG PO TABS
20.0000 mg | ORAL_TABLET | Freq: Every day | ORAL | 11 refills | Status: DC
  Filled 2022-03-16: qty 30, 30d supply, fill #0

## 2022-03-16 MED ORDER — LIRAGLUTIDE 18 MG/3ML ~~LOC~~ SOPN
0.6000 mg | PEN_INJECTOR | Freq: Every day | SUBCUTANEOUS | 11 refills | Status: DC
  Filled 2022-03-16: qty 3, 30d supply, fill #0

## 2022-03-16 NOTE — Patient Instructions (Addendum)
It was a pleasure seeing you today  Please start victoza 0.6 mg daily  Continue your insulin and metformin  Start atorvastatin 20 mg daily for cholesterol  Please bring your glucose meter to your next visit  Follow up in 1 -2 months  Fue un placer verte hoy.  Por favor comience con victoza 0,6 mg al da.  Contine con su insulina y metformina.  Iniciar atorvastatina 20 mg diarios para el colesterol  Por favor traiga su medidor de glucosa a su prxima visita.  Seguimiento en 1 -2 meses

## 2022-03-17 ENCOUNTER — Encounter: Payer: Self-pay | Admitting: Student

## 2022-03-17 NOTE — Progress Notes (Signed)
Internal Medicine Clinic Attending ? ?Case discussed with Dr. Liang  At the time of the visit.  We reviewed the resident?s history and exam and pertinent patient test results.  I agree with the assessment, diagnosis, and plan of care documented in the resident?s note. ? ?

## 2022-03-17 NOTE — Progress Notes (Signed)
Established Patient Office Visit  Subjective   Patient ID: Robin Klein, female    DOB: 10-21-1970  Age: 52 y.o. MRN: [id number]  Chief Complaint  Patient presents with   Follow-up   Diabetes    Robin Klein is a 52 y.o. person living with a history listed below who presents to clinic for follow up of diabetes and hypertension. Please refer to problem based charting for further details and assessment and plan of current problem and chronic medical conditions.     Patient Active Problem List   Diagnosis Date Noted   Health care maintenance 06/27/2014   Low back pain 07/24/2010   NAFLD (nonalcoholic fatty liver disease) 05/30/2009   Hyperlipidemia 01/03/2007   Essential hypertension, benign 12/23/2005   Type 2 diabetes mellitus ([place]) 01/05/1997    ROS: negative as per HPI    Objective:     BP 138/82 (BP Location: Right Arm, Patient Position: Sitting, Cuff Size: Small)   Pulse 92   Temp 98 F (36.7 C) (Oral)   Ht 5' (1.524 m)   Wt 153 lb 11.2 oz (69.7 kg)   SpO2 99%   BMI 30.02 kg/m  BP Readings from Last 3 Encounters:  03/16/22 138/82  02/13/22 132/80  02/06/22 123/76      Physical Exam Constitutional:      General: She is not in acute distress.    Appearance: Normal appearance. She is obese.  HENT:     Mouth/Throat:     Mouth: Mucous membranes are moist.     Pharynx: Oropharynx is clear.  Eyes:     Extraocular Movements: Extraocular movements intact.     Conjunctiva/sclera: Conjunctivae normal.     Pupils: Pupils are equal, round, and reactive to light.  Cardiovascular:     Rate and Rhythm: Normal rate and regular rhythm.     Heart sounds: No murmur heard. Pulmonary:     Effort: Pulmonary effort is normal.     Breath sounds: No rhonchi or rales.  Abdominal:     General: Abdomen is flat. Bowel sounds are normal. There is no distension.     Palpations: Abdomen is soft.     Tenderness: There is no abdominal tenderness.   Musculoskeletal:        General: Normal range of motion.     Right lower leg: No edema.     Left lower leg: No edema.  Skin:    General: Skin is warm and dry.     Capillary Refill: Capillary refill takes less than 2 seconds.  Neurological:     General: No focal deficit present.     Mental Status: She is alert and oriented to person, place, and time.  Psychiatric:        Mood and Affect: Mood normal.        Behavior: Behavior normal.     No results found for any visits on 03/16/22.  Last lipids Lab Results  Component Value Date   CHOL 200 (H) 02/13/2022   HDL 47 02/13/2022   LDLCALC 119 (H) 02/13/2022   TRIG 192 (H) 02/13/2022   CHOLHDL 4.3 02/13/2022   Last hemoglobin A1c Lab Results  Component Value Date   HGBA1C 10.6 (A) 02/13/2022      The 10-year ASCVD risk score (Arnett DK, et al., 2019) is: 4.2%    Assessment & Plan:   Problem List Items Addressed This Visit       Cardiovascular and Mediastinum   Essential  hypertension, benign (Chronic)    BP well controlled on current medications. Continue on amlodipine 10 mg daily      Relevant Medications   atorvastatin (LIPITOR) 20 MG tablet     Endocrine   Type 2 diabetes mellitus ([place]) - Primary (Chronic)    Reports CBG at home remain elevated at 120-180 fasting and between 200-250 after meals. Unfortunately she did not bring meter with her to OV today. She is now taking invokamet 50-'1000mg'$  twice daily again and tolerating well. Has not had trulicity in about a year due to cost. Still having some difficulty to take her medications, particularly novolog before meals. She is going to make checkboxes on her refrigerator and set a nighttime alarm for long acting insulin.   -start victoza 0.6 mg daily -continue lantus 70 units daily, novolog 16 units three times daily with meals.  -continue canagliflozin-metformin 50-'1000mg'$  twice daily  -follow up in 2 months, need repeat A1c at that time      Relevant Medications    liraglutide (VICTOZA) 18 MG/3ML SOPN   atorvastatin (LIPITOR) 20 MG tablet     Other   Hyperlipidemia (Chronic)    The 10-year ASCVD risk score (Arnett DK, et al., 2019) is: 4.2%. Not currently on statin. No issues on atorvastatin in the past. Will start on moderate intensity statin with history of DM.   -Start atorvastatin '20mg'$  daily       Relevant Medications   atorvastatin (LIPITOR) 20 MG tablet    Return in about 3 months (around 06/16/2022).    Iona Beard, MD

## 2022-03-17 NOTE — Assessment & Plan Note (Signed)
BP well controlled on current medications. Continue on amlodipine 10 mg daily

## 2022-03-17 NOTE — Assessment & Plan Note (Signed)
Reports CBG at home remain elevated at 120-180 fasting and between 200-250 after meals. Unfortunately she did not bring meter with her to OV today. She is now taking invokamet 50-'1000mg'$  twice daily again and tolerating well. Has not had trulicity in about a year due to cost. Still having some difficulty to take her medications, particularly novolog before meals. She is going to make checkboxes on her refrigerator and set a nighttime alarm for long acting insulin.   -start victoza 0.6 mg daily -continue lantus 70 units daily, novolog 16 units three times daily with meals.  -continue canagliflozin-metformin 50-'1000mg'$  twice daily  -follow up in 2 months, need repeat A1c at that time

## 2022-03-17 NOTE — Assessment & Plan Note (Signed)
The 10-year ASCVD risk score (Arnett DK, et al., 2019) is: 4.2%. Not currently on statin. No issues on atorvastatin in the past. Will start on moderate intensity statin with history of DM.   -Start atorvastatin '20mg'$  daily

## 2022-03-26 ENCOUNTER — Other Ambulatory Visit (HOSPITAL_COMMUNITY): Payer: Self-pay

## 2022-03-26 ENCOUNTER — Other Ambulatory Visit: Payer: Self-pay

## 2022-03-27 ENCOUNTER — Other Ambulatory Visit (HOSPITAL_COMMUNITY): Payer: Self-pay

## 2022-04-01 ENCOUNTER — Telehealth: Payer: Self-pay | Admitting: Dietician

## 2022-04-01 NOTE — Telephone Encounter (Signed)
Left voicemail for return call using pacific interpreter # [phone]

## 2022-04-08 ENCOUNTER — Ambulatory Visit: Payer: Self-pay | Admitting: Dietician

## 2022-04-08 NOTE — Patient Instructions (Addendum)
Por favor traiga su registro de alimentos a su prxima visita.  Llmame o a Dexcom si tienes preguntas 1 [phone]  doa y Spero Geralds [phone]     Please bring your food record back to your next visit.  Call me or Dexcom with questions 1 [phone]  [facility] and Spero Geralds [phone]

## 2022-04-08 NOTE — Progress Notes (Signed)
Patient came today for free sample of Dexcom G 7. She is using her smart phone as receiver. She will come back in a week with information about food, medication and activities to advise her according with her BG, to assist with lowering her  A1C of 10 %. Patient understood the teaching purpose of the he CGM. She states that her insurance won't  cover a personal Continuous glucose monitor. Robin Klein, RD 04/08/2022 3:06 PM.   .

## 2022-04-08 NOTE — Telephone Encounter (Signed)
Called to patient regarding her diabetes. Patient states she takes her medication as prescribed Except Victoza because causes her diarrhea,headache and nausea. She checks her blood glucose every morning, and random everyday. She is coming today to have a sample CGM .

## 2022-04-09 ENCOUNTER — Telehealth: Payer: Self-pay | Admitting: Dietician

## 2022-04-09 NOTE — Telephone Encounter (Signed)
Left voicemail for return call  

## 2022-04-15 ENCOUNTER — Ambulatory Visit: Payer: Self-pay | Admitting: Dietician

## 2022-04-15 NOTE — Progress Notes (Signed)
Patient came today for reviewed the CGM G7 data. (Free trial) We used an interpreter for the entire visit Byrd Hesselbach)  We analyze  her food record and briefly discussed options for improve diabetes control.   Norm Parcel, RD 04/15/2022 2:09 PM.

## 2022-04-16 ENCOUNTER — Telehealth: Payer: Self-pay | Admitting: Dietician

## 2022-04-16 NOTE — Telephone Encounter (Addendum)
Patient called for a yesterday follow up appointment.  We talked about:  Her diabetes goal is to make her sugar go down Waiting 20 minutes after NovoLog to start eating. Try to have low carbs snack at night.  Have protein with breakfast. Increase vegetable consumption at lunch and dinner.  Keeping exercising every day.    Julius Bowels, Dietetic Intern  04/16/2022  1:42 PM

## 2022-04-16 NOTE — Telephone Encounter (Signed)
I reviewed and approve this note and the plan. Norm Parcel, RD 04/16/2022 4:07 PM.

## 2022-04-16 NOTE — Telephone Encounter (Signed)
I called patient to known how she doing with CGM

## 2022-04-22 ENCOUNTER — Telehealth: Payer: Self-pay | Admitting: Dietician

## 2022-04-22 ENCOUNTER — Other Ambulatory Visit (HOSPITAL_COMMUNITY): Payer: Self-pay

## 2022-04-22 NOTE — Telephone Encounter (Signed)
I called patient to schedule a follow up after having a CGM for 10 days.

## 2022-04-27 ENCOUNTER — Telehealth: Payer: Self-pay | Admitting: Dietician

## 2022-04-27 ENCOUNTER — Encounter: Payer: Self-pay | Admitting: Dietician

## 2022-04-27 ENCOUNTER — Other Ambulatory Visit: Payer: Self-pay | Admitting: Dietician

## 2022-04-27 DIAGNOSIS — E1165 Type 2 diabetes mellitus with hyperglycemia: Secondary | ICD-10-CM

## 2022-04-27 MED ORDER — LANTUS SOLOSTAR 100 UNIT/ML ~~LOC~~ SOPN
70.0000 [IU] | PEN_INJECTOR | Freq: Every day | SUBCUTANEOUS | 3 refills | Status: DC
  Filled 2022-04-28: qty 21, 30d supply, fill #0

## 2022-04-27 NOTE — Telephone Encounter (Signed)
I called the patient for reschedule. She is coming for a follow up diabetes management  on  Monday 29 at 3:15
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
## 2022-04-27 NOTE — Telephone Encounter (Signed)
Refill request for Lantus( she has been out for about 2 weeks), pharmacy states they have amlodipine ready. Her next appointment with a doctor is May 13

## 2022-04-27 NOTE — Telephone Encounter (Signed)
Eddis called back saying that she doesn't have refill of Lantus, neither of Amlodipin. I called pharmacy and told me to ask her Doctor for a refill.

## 2022-04-28 ENCOUNTER — Telehealth: Payer: Self-pay | Admitting: Dietician

## 2022-04-28 ENCOUNTER — Other Ambulatory Visit (HOSPITAL_COMMUNITY): Payer: Self-pay

## 2022-04-28 NOTE — Telephone Encounter (Signed)
I called patient to let her know that her insulin is refill and Amlodipin is ready to pick it up at her pharmacy.

## 2022-04-29 ENCOUNTER — Other Ambulatory Visit (HOSPITAL_COMMUNITY): Payer: Self-pay

## 2022-04-29 MED ORDER — AMOXICILLIN 500 MG PO CAPS
500.0000 mg | ORAL_CAPSULE | Freq: Three times a day (TID) | ORAL | 0 refills | Status: DC
  Filled 2022-04-29: qty 30, 10d supply, fill #0

## 2022-05-04 ENCOUNTER — Ambulatory Visit: Payer: Self-pay | Admitting: Dietician

## 2022-05-04 NOTE — Patient Instructions (Addendum)
Please,call us any time if you have questions, or if you need something to manage your diabetes.

## 2022-05-04 NOTE — Progress Notes (Unsigned)
  Medical Nutrition Therapy  Appt start time: 1530 end time:  1600. Total time: 30 Visit # 1   05/04/2022 Geraldin Habermehl [id number]  Assessment:  Primary concerns today:  Patient states that she didn't have insulin for almost 2 weeks and her BG went up to 365.Currently, she is using her medication as prescribed. She now knows if she runs out of insulin, she can contact us for help.She wants to take care of her for herself and her children. Her support system is her husband, children church and coworkers.  She states that she is able to control cravings such as instead of having a big piece of chocolate, she just take a bite. She  is not at as hungry as before She is not longer drinking regular sodas, because she is not thirsty all the time. Her energy level has increase. She goes to bed earlier (9:00 PM or !0:00 PM) She is exercise at least 30 minutes every day with her children  Preferred Learning Style:  Visual  Learning Readiness:  Ready Change in progress  SLEEP: at least 7 hours, goes to bed, earlier than before.   MEDICATIONS: Novolog pen 16 Unit and Lantus 70  DIETARY INTAKE: Usual eating pattern includes 4 meals and 2 snacks per day. Everyday foods include: Green smoothly .  Avoided foods include regular soda  Food Intolerances: none Dining Out (times/week): sometimes once a week. 24-hr recall:  B ( AM): eggs, bean, tortillas Snk ( AM): Green smoothly with fruit and vegetables. L ( PM): Chicken, salad, tortilla, vegetables Snk ( PM):none D ( PM): meat, vegetables, rice, beans, tortillas, salsa. Snk ( PM): beans, tortilla. Beverages: water,   Usual physical activity:  Walking at least 30 minutes everyday with her children, at work she does physical activity   Progress Towards Goal(s):  In progress.   Nutritional Diagnosis:  NB-1.6 Limited adherence to nutrition-related recommendations As related to self confident and stress.  As evidenced by HgA1C  10.6.    Intervention:  Nutrition  C NUTRITION COUNSELING C-1 Nutrition Counseling   A supportive process to set priorities, establish goal, and create individualized action plans that acknowledge and foster responsibility for self-care. Theory or approach  Behavior modification Social Counsellor Motivational Interview Rewards/reinforcement Social support Stress management Goal Settings Problem solving Self-monitoring  Action Goal: Eating nutrition balance food.  Outcome goal: Decrease HGA1C Coordination of care: None   Teaching Method Utilized: Visual, Auditory,Hands on Handouts given during visit include:None at this visit. Barriers to learning/adherence to lifestyle change: None Demonstrated degree of understanding via:  Teach Back   Monitoring/Evaluation:  Dietary intake, exercise, meter, and body weight follow up when you have insurance.

## 2022-05-05 NOTE — Progress Notes (Signed)
I reviewed and approve this note and the plan. Norm Parcel, RD 05/05/2022 3:25 PM.

## 2022-05-18 ENCOUNTER — Other Ambulatory Visit (HOSPITAL_COMMUNITY): Payer: Self-pay

## 2022-05-18 ENCOUNTER — Ambulatory Visit (INDEPENDENT_AMBULATORY_CARE_PROVIDER_SITE_OTHER): Payer: Self-pay | Admitting: Student

## 2022-05-18 VITALS — BP 122/75 | HR 80 | Temp 98.0°F | Ht 60.0 in | Wt 157.0 lb

## 2022-05-18 DIAGNOSIS — I10 Essential (primary) hypertension: Secondary | ICD-10-CM

## 2022-05-18 DIAGNOSIS — Z7984 Long term (current) use of oral hypoglycemic drugs: Secondary | ICD-10-CM

## 2022-05-18 DIAGNOSIS — E119 Type 2 diabetes mellitus without complications: Secondary | ICD-10-CM

## 2022-05-18 DIAGNOSIS — E1165 Type 2 diabetes mellitus with hyperglycemia: Secondary | ICD-10-CM

## 2022-05-18 DIAGNOSIS — M7542 Impingement syndrome of left shoulder: Secondary | ICD-10-CM

## 2022-05-18 DIAGNOSIS — Z794 Long term (current) use of insulin: Secondary | ICD-10-CM

## 2022-05-18 LAB — GLUCOSE, CAPILLARY: Glucose-Capillary: 186 mg/dL — ABNORMAL HIGH (ref 70–99)

## 2022-05-18 LAB — POCT GLYCOSYLATED HEMOGLOBIN (HGB A1C): Hemoglobin A1C: 10.9 % — AB (ref 4.0–5.6)

## 2022-05-18 MED ORDER — INVOKAMET 50-1000 MG PO TABS
1.0000 | ORAL_TABLET | Freq: Two times a day (BID) | ORAL | 1 refills | Status: DC
  Filled 2022-05-18: qty 60, 30d supply, fill #0

## 2022-05-18 NOTE — Patient Instructions (Addendum)
Increase lantus 75 units daily Increase novolog 20 units three times a day  Take ibuprofen for shoulder pain and try the shoulder exercises  Follow up in 1 month     Aumentar lantus 75 unidades diarias. Aumentar novolog 20 unidades tres veces al [employer].  Tome ibuprofeno para el dolor de hombro y pruebe los ejercicios para hombros.  Seguimiento en 1 mes

## 2022-05-19 ENCOUNTER — Other Ambulatory Visit (HOSPITAL_COMMUNITY): Payer: Self-pay

## 2022-05-19 DIAGNOSIS — M7542 Impingement syndrome of left shoulder: Secondary | ICD-10-CM | POA: Insufficient documentation

## 2022-05-19 MED ORDER — LANTUS SOLOSTAR 100 UNIT/ML ~~LOC~~ SOPN
75.0000 [IU] | PEN_INJECTOR | Freq: Every day | SUBCUTANEOUS | 3 refills | Status: DC
  Filled 2022-05-19: qty 15, 20d supply, fill #0

## 2022-05-19 MED ORDER — NOVOLOG FLEXPEN 100 UNIT/ML ~~LOC~~ SOPN
20.0000 [IU] | PEN_INJECTOR | Freq: Three times a day (TID) | SUBCUTANEOUS | 12 refills | Status: DC
  Filled 2022-05-19: qty 15, fill #0
  Filled 2022-08-12: qty 15, 25d supply, fill #0
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
## 2022-05-19 NOTE — Assessment & Plan Note (Addendum)
A1c remains poorly controlled at 10.9% today.  Fasting CBGs on meter reading between 270 and 330 mg daily.  No lows or normal readings.  Reports while on Dexcom often noted elevations in the morning after stacking around 10 PM.  Current medications include Lantus 70 units daily, NovoLog 16 units 3 times a day with meals, canagliflozin-metformin 50-1000 mg twice daily, Victoza 0.6 mg daily.    Patient reports she occasionally forgets her NovoLog but rarely forgets her glargine.  Is only taking canagliflozin-metformin once a day due to large pill size.  She discontinued Victoza due to severe diarrhea after 5 days on this medication.  She had called Lupita Leash who encouraged her to try taking a half dose which she tried for 1 day was 2 concerned about diarrhea to continue this.  She does not have dysphagia or other difficulties with swallowing outside difficulty with this 1 medication.  She is currently splitting canagliflozin to 2 halves and taking this once a day.  Discussed concern given her poor glycemic control.  Patient states she is not having any difficulties with affording her medications and reports she is trying to use her insulin more regularly.  We discussed strategies to help her remember NovoLog including setting an alarm.  I discussed splitting canagliflozin-metformin with pharmacist States well this medication cannot be crushed is still effective if split into 2 halves.  Will hold off on restarting Victoza until she is taking her canagliflozin-metformin regularly to avoid further GI symptoms.  She is not currently experiencing any diarrhea.  Increase canagliflozin-metformin to 50-1000 mg twice daily, okay to split pills in half to make it easier to swallow Will discuss with pharmacy tech if there are smaller pill options for this patient. Increase glargine to 75 units daily, increase NovoLog to 20 units 3 times a day with meals If tolerating her current medications without significant side effects  and need additional glycemic control can discuss retitrating Victoza in the future.

## 2022-05-19 NOTE — Assessment & Plan Note (Signed)
Blood pressure well-controlled.  Continue on amlodipine 10 mg daily.
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
## 2022-05-20 ENCOUNTER — Other Ambulatory Visit (HOSPITAL_COMMUNITY): Payer: Self-pay

## 2022-05-20 ENCOUNTER — Encounter: Payer: Self-pay | Admitting: Dietician

## 2022-05-20 NOTE — Progress Notes (Signed)
Internal Medicine Clinic Attending ? ?Case discussed with Dr. Liang  At the time of the visit.  We reviewed the resident?s history and exam and pertinent patient test results.  I agree with the assessment, diagnosis, and plan of care documented in the resident?s note. ? ?

## 2022-06-11 ENCOUNTER — Other Ambulatory Visit (HOSPITAL_COMMUNITY): Payer: Self-pay

## 2022-06-12 ENCOUNTER — Other Ambulatory Visit (HOSPITAL_COMMUNITY): Payer: Self-pay

## 2022-06-18 ENCOUNTER — Other Ambulatory Visit (HOSPITAL_COMMUNITY): Payer: Self-pay

## 2022-06-18 ENCOUNTER — Other Ambulatory Visit: Payer: Self-pay

## 2022-06-18 ENCOUNTER — Encounter: Payer: Self-pay | Admitting: Internal Medicine

## 2022-06-18 ENCOUNTER — Ambulatory Visit: Payer: Self-pay | Admitting: Internal Medicine

## 2022-06-18 VITALS — BP 120/75 | HR 79 | Temp 97.8°F | Ht 60.0 in | Wt 157.0 lb

## 2022-06-18 DIAGNOSIS — E782 Mixed hyperlipidemia: Secondary | ICD-10-CM

## 2022-06-18 DIAGNOSIS — Z7984 Long term (current) use of oral hypoglycemic drugs: Secondary | ICD-10-CM

## 2022-06-18 DIAGNOSIS — E1165 Type 2 diabetes mellitus with hyperglycemia: Secondary | ICD-10-CM

## 2022-06-18 DIAGNOSIS — Z794 Long term (current) use of insulin: Secondary | ICD-10-CM

## 2022-06-18 DIAGNOSIS — E785 Hyperlipidemia, unspecified: Secondary | ICD-10-CM

## 2022-06-18 MED ORDER — LANTUS SOLOSTAR 100 UNIT/ML ~~LOC~~ SOPN
40.0000 [IU] | PEN_INJECTOR | Freq: Two times a day (BID) | SUBCUTANEOUS | 3 refills | Status: DC
  Filled 2022-06-18: qty 24, 30d supply, fill #0
  Filled 2022-08-12: qty 24, 30d supply, fill #1
  Filled 2022-09-21: qty 24, 30d supply, fill #2

## 2022-06-18 MED ORDER — ATORVASTATIN CALCIUM 20 MG PO TABS
20.0000 mg | ORAL_TABLET | Freq: Every day | ORAL | 11 refills | Status: DC
  Filled 2022-06-18: qty 30, 30d supply, fill #0
  Filled 2022-08-03: qty 30, 30d supply, fill #1

## 2022-06-18 NOTE — Progress Notes (Signed)
Subjective:  CC: diabetes follow-up  HPI:  [name] is a 52 y.o. female with a past medical history stated below and presents today for diabetes. Please see problem based assessment and plan for additional details.  Interpreter services used during encounter.  Past Medical History:  Diagnosis Date   Depression    Diabetes mellitus    Fatty liver disease, nonalcoholic    Hypertension    Sinusitis     Current Outpatient Medications on File Prior to Visit  Medication Sig Dispense Refill   amLODipine (NORVASC) 10 MG tablet Take 1 tablet (10 mg total) by mouth daily. 90 tablet 1   amoxicillin (AMOXIL) 500 MG capsule Take 1 capsule (500 mg total) by mouth 3 (three) times daily until gone. 30 capsule 0   Canagliflozin-metFORMIN HCl (INVOKAMET) 50-1000 MG TABS Take 1 tablet by mouth 2 (two) times daily. 180 tablet 1   glucose blood (CONTOUR NEXT TEST) test strip CHECK  BLOOD SUGARS FOUR TIMES DAILY - FIRST THING IN THE MORNING AND BEFORE MEALS. 100 strip 12   insulin aspart (NOVOLOG FLEXPEN) 100 UNIT/ML FlexPen Inject 20 Units into the skin 3 (three) times daily with meals. 15 mL 12   Insulin Pen Needle (PEN NEEDLES) 31G X 5 MM MISC Inject 1 each into the skin 4 (four) times daily -  before meals and at bedtime. 100 each 11   Insulin Pen Needle (UNIFINE PENTIPS) 31G X 5 MM MISC INJECT 1 EACH INTO THE SKIN 4 TIMES DAILY - BEFORE MEALS AND AT BEDTIME. 100 each 11   Insulin Syringe-Needle U-100 (GNP INSULIN SYRINGE) 31G X 5/16" 0.5 ML MISC Use to inject insulin daily as instructed 100 each 3   Lancets MISC Use to check blood sugar as directed 100 each 3   liraglutide (VICTOZA) 18 MG/3ML SOPN Inject 0.6 mg into the skin daily. 3 mL 11   No current facility-administered medications on file prior to visit.    Family History  Problem Relation Age of Onset   Diabetes Mother    CAD Mother        Died MI 36   Diabetes Father    CAD Father        Died MI 57   Cancer  Paternal Grandmother     Social History   Socioeconomic History   Marital status: Married    Spouse name: Not on file   Number of children: Not on file   Years of education: 7   Highest education level: Not on file  Occupational History    Employer: UNEMPLOYED  Tobacco Use   Smoking status: Never   Smokeless tobacco: Never  Substance and Sexual Activity   Alcohol use: No    Alcohol/week: 0.0 standard drinks of alcohol   Drug use: No   Sexual activity: Not on file  Other Topics Concern   Not on file  Social History Narrative   Financial assistance approved for 100% discount at [facility] and has [facility] card per [name]   12/16/2009   Recently gave birth to a son 04/2009.   Social Determinants of Health   Financial Resource Strain: Not on file  Food Insecurity: Not on file  Transportation Needs: Not on file  Physical Activity: Not on file  Stress: Not on file  Social Connections: Not on file  Intimate Partner Violence: Not on file    Review of Systems: ROS negative except for what is noted on the assessment and plan.  Objective:   Vitals:   06/18/22 1522 06/18/22 1548  BP: 135/86 120/75  Pulse: 78 79  Temp: 97.8 F (36.6 C)   TempSrc: Oral   SpO2: 99%   Weight: 157 lb (71.2 kg)   Height: 5' (1.524 m)     Physical Exam: Constitutional: well-appearing Cardiovascular: regular rate and rhythm, no m/r/g Pulmonary/Chest: normal work of breathing on room air, lungs clear to auscultation bilaterally Abdominal: soft, non-tender, non-distended MSK: normal bulk and tone Skin: warm and dry   Assessment & Plan:  Type 2 diabetes mellitus (HCC) She presents for 1 month follow-up after titration of insulin. A1c in May increased from 10.6 to 10.9. Changes at last visit included increasing lantus from 70 to 75 units, novolog from 16 to 20 units TID, and canagliflozin-metformin 50-1000mg  BID. She has also restarted victoza 0.6 mg and is tolerating this well without  additional episodes of diarrhea. She uses a glucometer to check blood sugar in the morning before breakfast and also in the afternoon at lunch. She did not bring glucometer today but reports average fasting sugars between 179-200s and afternoon around 150s. She has not had any low blood sugars.  P: Change lantus to 40 units BID Continue novolog 20 units TID Continue canagliflozin-metformin 50-1000mg  BID Continue victoza 0.6 mg daily Follow-up in 1 month with glucometer  Hyperlipidemia Atorvastatin refilled. She has not been taking this medication.   Patient discussed with Dr. Josetta Huddle Denis Klein, D.O. [facility]  PGY-2 Pager: [phone]  Phone: [phone] Date 06/19/2022  Time 7:18 AM

## 2022-06-18 NOTE — Patient Instructions (Signed)
Thank you, Ms.Herma S Tresa Res for allowing [place] to provide your care today.  Diabetes Please start taking lantus 40 units 2x daily. Continue taking novolog 20 units with each meal Continue taking victoza and metformin combo pill as you have been Check your blood sugar in the morning before eating breakfast and before each meal. Bring glucometer back when you follow-up in 1 month.  Diabetes Empiece a tomar lantus 40 unidades 2 veces al da. Contine tomando novolog 20 unidades con cada comida. Contine tomando la pastilla combinada de victoza y metformina como lo ha estado haciendo hasta ahora. Controle su nivel de azcar en sangre por la maana, antes del desayuno y antes de cada comida. Elsworth Soho el glucmetro cuando haga el seguimiento en 1 mes.   I have ordered the following medication/changed the following medications:   Stop the following medications: There are no discontinued medications.   Start the following medications: No orders of the defined types were placed in this encounter.    Follow up:  1 month    We look forward to seeing you next time. Please call our clinic at [phone] if you have any questions or concerns. The best time to call is Monday-Friday from 9am-4pm, but there is someone available 24/7. If after hours or the weekend, call the main hospital number and ask for the Internal Medicine Resident On-Call. If you need medication refills, please notify your pharmacy one week in advance and they will send [place] a request.   Thank you for trusting me with your care. Wishing you the best!   Rudene Christians, DO [facility]

## 2022-06-19 NOTE — Assessment & Plan Note (Signed)
Atorvastatin refilled. She has not been taking this medication.

## 2022-06-19 NOTE — Progress Notes (Signed)
Internal Medicine Clinic Attending  Case discussed with Dr. Masters  At the time of the visit.  We reviewed the resident's history and exam and pertinent patient test results.  I agree with the assessment, diagnosis, and plan of care documented in the resident's note.  

## 2022-06-19 NOTE — Assessment & Plan Note (Signed)
She presents for 1 month follow-up after titration of insulin. A1c in May increased from 10.6 to 10.9. Changes at last visit included increasing lantus from 70 to 75 units, novolog from 16 to 20 units TID, and canagliflozin-metformin 50-1000mg  BID. She has also restarted victoza 0.6 mg and is tolerating this well without additional episodes of diarrhea. She uses a glucometer to check blood sugar in the morning before breakfast and also in the afternoon at lunch. She did not bring glucometer today but reports average fasting sugars between 179-200s and afternoon around 150s. She has not had any low blood sugars.  P: Change lantus to 40 units BID Continue novolog 20 units TID Continue canagliflozin-metformin 50-1000mg  BID Continue victoza 0.6 mg daily Follow-up in 1 month with glucometer

## 2022-06-26 ENCOUNTER — Other Ambulatory Visit (HOSPITAL_COMMUNITY): Payer: Self-pay

## 2022-07-30 ENCOUNTER — Other Ambulatory Visit (HOSPITAL_COMMUNITY): Payer: Self-pay

## 2022-07-30 ENCOUNTER — Other Ambulatory Visit: Payer: Self-pay | Admitting: Internal Medicine

## 2022-07-30 DIAGNOSIS — I10 Essential (primary) hypertension: Secondary | ICD-10-CM

## 2022-07-31 ENCOUNTER — Other Ambulatory Visit (HOSPITAL_COMMUNITY): Payer: Self-pay

## 2022-07-31 MED ORDER — AMLODIPINE BESYLATE 10 MG PO TABS
10.0000 mg | ORAL_TABLET | Freq: Every day | ORAL | 1 refills | Status: DC
  Filled 2022-07-31: qty 30, 30d supply, fill #0
  Filled 2022-09-10: qty 30, 30d supply, fill #1
  Filled 2022-10-13: qty 30, 30d supply, fill #2
  Filled 2022-11-19: qty 30, 30d supply, fill #3
  Filled 2023-01-05: qty 30, 30d supply, fill #4
  Filled 2023-01-07: qty 30, 30d supply, fill #0

## 2022-08-03 ENCOUNTER — Other Ambulatory Visit (HOSPITAL_COMMUNITY): Payer: Self-pay

## 2022-08-12 ENCOUNTER — Other Ambulatory Visit (HOSPITAL_COMMUNITY): Payer: Self-pay

## 2022-08-13 ENCOUNTER — Other Ambulatory Visit (HOSPITAL_COMMUNITY): Payer: Self-pay

## 2022-09-10 ENCOUNTER — Other Ambulatory Visit (HOSPITAL_COMMUNITY): Payer: Self-pay

## 2022-09-21 ENCOUNTER — Other Ambulatory Visit: Payer: Self-pay | Admitting: Internal Medicine

## 2022-09-21 ENCOUNTER — Other Ambulatory Visit: Payer: Self-pay

## 2022-09-21 ENCOUNTER — Other Ambulatory Visit (HOSPITAL_COMMUNITY): Payer: Self-pay

## 2022-09-21 DIAGNOSIS — Z794 Long term (current) use of insulin: Secondary | ICD-10-CM

## 2022-09-21 MED ORDER — BASAGLAR KWIKPEN 100 UNIT/ML ~~LOC~~ SOPN
40.0000 [IU] | PEN_INJECTOR | Freq: Two times a day (BID) | SUBCUTANEOUS | 3 refills | Status: DC
  Filled 2022-09-21: qty 15, 19d supply, fill #0
  Filled 2022-10-21: qty 15, 19d supply, fill #1

## 2022-09-21 NOTE — Progress Notes (Addendum)
Lantus switched to basaglar as insurance does not cover former. I sent message to front office to help arrange follow-up for this patient.

## 2022-09-22 ENCOUNTER — Telehealth: Payer: Self-pay

## 2022-09-22 NOTE — Telephone Encounter (Signed)
Medication no longer on IM program.   Switching to PAP (Patient Assistance Program).   Mailing [employer] features editor to patients home for [employer], Victoza, & Novolog assistance.  Patient currently switched to Basaglar [facility]) while Lantus assistance is pending

## 2022-09-23 ENCOUNTER — Other Ambulatory Visit (HOSPITAL_COMMUNITY): Payer: Self-pay

## 2022-09-23 MED ORDER — TECHLITE PEN NEEDLES 31G X 5 MM MISC
Freq: Four times a day (QID) | 11 refills | Status: DC
  Filled 2022-09-23 (×2): qty 100, 25d supply, fill #0
  Filled 2022-10-21: qty 100, 25d supply, fill #1
  Filled 2022-11-19: qty 100, 25d supply, fill #2
  Filled 2023-01-18: qty 100, 25d supply, fill #3
  Filled 2023-02-15: qty 100, 25d supply, fill #4

## 2022-09-24 ENCOUNTER — Other Ambulatory Visit: Payer: Self-pay

## 2022-09-24 ENCOUNTER — Telehealth: Payer: Self-pay | Admitting: *Deleted

## 2022-09-24 NOTE — Telephone Encounter (Signed)
Pt stated she was told by [facility] pharmacy, her Lantus is at our office. Per chart, Lantus was changed to [employer]. I called Nutritional therapist rx is ready for pick up at no cost. Info given to pt and I explained med change until we hear from Med Assist Program. Also appt given pt.

## 2022-09-28 LAB — HM DIABETES EYE EXAM

## 2022-10-13 ENCOUNTER — Other Ambulatory Visit (HOSPITAL_COMMUNITY): Payer: Self-pay

## 2022-10-21 ENCOUNTER — Other Ambulatory Visit: Payer: Self-pay

## 2022-10-26 ENCOUNTER — Other Ambulatory Visit (HOSPITAL_COMMUNITY): Payer: Self-pay

## 2022-10-26 ENCOUNTER — Ambulatory Visit: Payer: Self-pay | Admitting: Internal Medicine

## 2022-10-26 VITALS — BP 139/73 | Wt 161.1 lb

## 2022-10-26 DIAGNOSIS — Z794 Long term (current) use of insulin: Secondary | ICD-10-CM

## 2022-10-26 DIAGNOSIS — E782 Mixed hyperlipidemia: Secondary | ICD-10-CM

## 2022-10-26 DIAGNOSIS — E119 Type 2 diabetes mellitus without complications: Secondary | ICD-10-CM

## 2022-10-26 LAB — POCT GLYCOSYLATED HEMOGLOBIN (HGB A1C): Hemoglobin A1C: 12.7 % — AB (ref 4.0–5.6)

## 2022-10-26 LAB — GLUCOSE, CAPILLARY: Glucose-Capillary: 278 mg/dL — ABNORMAL HIGH (ref 70–99)

## 2022-10-26 MED ORDER — LIRAGLUTIDE 18 MG/3ML ~~LOC~~ SOPN
0.6000 mg | PEN_INJECTOR | Freq: Every day | SUBCUTANEOUS | 3 refills | Status: DC
  Filled 2022-10-26 – 2022-10-29 (×2): qty 3, 30d supply, fill #0

## 2022-10-26 MED ORDER — METFORMIN HCL 500 MG PO TABS
500.0000 mg | ORAL_TABLET | Freq: Two times a day (BID) | ORAL | 3 refills | Status: DC
  Filled 2022-10-26: qty 60, 30d supply, fill #0

## 2022-10-26 MED ORDER — NOVOLOG FLEXPEN 100 UNIT/ML ~~LOC~~ SOPN
20.0000 [IU] | PEN_INJECTOR | Freq: Three times a day (TID) | SUBCUTANEOUS | 12 refills | Status: DC
  Filled 2022-10-26: qty 15, 25d supply, fill #0

## 2022-10-26 MED ORDER — CANAGLIFLOZIN 100 MG PO TABS
100.0000 mg | ORAL_TABLET | Freq: Every day | ORAL | 3 refills | Status: DC
  Filled 2022-10-26: qty 30, 30d supply, fill #0

## 2022-10-26 MED ORDER — LANCETS MISC
3 refills | Status: AC
  Filled 2022-10-26: qty 100, 100d supply, fill #0
  Filled 2023-03-17: qty 100, 100d supply, fill #1

## 2022-10-26 MED ORDER — ATORVASTATIN CALCIUM 20 MG PO TABS
20.0000 mg | ORAL_TABLET | Freq: Every day | ORAL | 3 refills | Status: DC
  Filled 2022-10-26: qty 30, 30d supply, fill #0

## 2022-10-26 MED ORDER — BASAGLAR KWIKPEN 100 UNIT/ML ~~LOC~~ SOPN
40.0000 [IU] | PEN_INJECTOR | Freq: Two times a day (BID) | SUBCUTANEOUS | 3 refills | Status: DC
  Filled 2022-10-26: qty 15, 19d supply, fill #0
  Filled 2022-11-09: qty 30, 38d supply, fill #0

## 2022-10-26 NOTE — Assessment & Plan Note (Signed)
Today, A1c is increased from 10.9 on 5/13 to 12.7. She has recently received a diabetic retinal exam which showed no retinopathy and she does not have any concerns on her foot exam today. She reports that she is taking the following medications as directed: basaglar 40 BID, novalog 20 TID, canagliflozin-metformin 50-1000 BID. She reports that in a typical week she always remembers her basaglar, maybe one day a week forgets her novalog, and has been taking her metformin for the last 15 days. She has been having intermittent diarrhea during these 15 days. Her glucometer readings show readings averaging in the 130s over the last few weeks and in the 300s prior to October. When her glucose is down to the 90s, she reports feeling shaky and sweaty.   She states that she typically checks her glucose in the mornings.However, the elevated A1c indicates uncontrolled diabetes. At this time, it is unclear if this is a result of decreased adherence, likely due to access, or if her insulin requires further titration with prandial insulin. She is amenable to increasing her daily glucose readings to 2-3 times per day. We set a goal for her to check her fasting glucose daily and at least one blood sugar before a meal prior to next visit.   With reports of diarrhea, we will decrease the dose to 500 BID and see if this improves  - Increase daily glucose checks to 2 times per day - Start metformin 500mg  BID - Start canagliflozin 100 mg daily - Restart victoza 0.6 mg daily  - Continue basaglar to 40 units BID  - Continue novolog 20 units TID - Follow-up in 2 weeks with glucometer  Addendum 10/27/22: I talked with [name], several medications have been taken off of $4 list including all SGLT2. Will send in empagliflozi 10 mg and camille will send patient assistance papers for that.

## 2022-10-26 NOTE — Patient Instructions (Addendum)
Robin Klein. [name] por permitirnos brindarle atencin mdica hoy.   Diabetes: Su A1c es muy alta hoy. Nuestro objetivo es reiniciar sus medicamentos. Existe un programa que establece que todos los medicamentos que se envan deben estar en una lista de $4. Los medicamentos incluyen: 1. Metformina 500 mg 2 veces al da 2. Canagliflozina 100 mg al da 3. Inyeccin de Victoza 0,6 mg al da 4. Glargina 40 unidades 2 veces al da 5. Novolog 20 unidades 3 veces al da  Colesterol 1. Atorvastatina  Estos medicamentos deben estar cubiertos por el programa de medicina interna.  Seguimiento: 2 semanas  Esperamos verlo la prxima vez. Llame a nuestra clnica al [phone] si tiene alguna pregunta o inquietud. El mejor momento para llamar es de lunes a viernes de 9 a. m. a 4 p. m., pero hay alguien disponible las 24 horas, los 7 [address]  [address] de [place]. Si necesita atencin fuera del horario de atencin o durante el fin de [place], llame al nmero principal del hospital y pregunte por el residente de medicina interna de [place]. Si necesita reabastecimiento de medicamentos, notifique a su farmacia con una semana de anticipacin y nos enviarn una solicitud.   Gracias por confiarme su atencin. Le deseo lo mejor!   Rudene Christians, DO [facility]

## 2022-10-26 NOTE — Assessment & Plan Note (Signed)
Atorvastatin 20 refilled. She had not been taking this medication consistently.

## 2022-10-26 NOTE — Progress Notes (Incomplete)
Attestation for Student Documentation:  I personally was present and performed or re-performed the history, physical exam and medical decision-making activities of this service and have verified that the service and findings are accurately documented in the student's note.  Diabetes- high concern for access difficulties with medications. Refills sent under IM program. She reports that diet is the hardest part for her diabetic care. For now, goals are to increase access to medications and frequency of blood sugars checks with close follow-up. Long term, she will need more diabetic diet counseling.  Addendum 10/27/22: I talked with [name], several medications have been taken off of $4 list including all SGLT2. Will send in empagliflozi 10 mg and [name] will send patient assistance papers for that. Insulin is covered for patient when she picks up at [facility] through a fund. I was not able to reach patient after calling multiple times using interpreter. VM left. I will send in empagfliflozin and I have called [facility] to be sure that canagliflozin was canceled. Addendum 1700: Able to talk with patient on phone. She will have to go to [employer] for basaglar and [facility] for [employer]. [name] is sending papers for empagloflozin.  Katie M. Masters, D.O.  Internal Medicine Resident, PGY-3 [facility]  5:26 PM, 10/26/2022      This is a Medical Student Note.  The care of the patient was discussed with Drs. Masters and Financial trader and the assessment and plan was formulated with their assistance.  Please see their note for official documentation of the patient encounter.   Subjective:   Patient ID: Robin Klein female   DOB: Oct 07, 1970 52 y.o.   MRN: [id number]  HPI: Robin Klein is a 52 y.o. presenting today for diabetes management. Please see problem based assessment and plan for additional details.   Interpreter services used during  encounter.  Past Medical History:  Diagnosis Date   Depression    Diabetes mellitus    Fatty liver disease, nonalcoholic    Hypertension    Sinusitis    Current Outpatient Medications  Medication Sig Dispense Refill   amLODipine (NORVASC) 10 MG tablet Take 1 tablet (10 mg total) by mouth daily. 90 tablet 1   amoxicillin (AMOXIL) 500 MG capsule Take 1 capsule (500 mg total) by mouth 3 (three) times daily until gone. 30 capsule 0   atorvastatin (LIPITOR) 20 MG tablet Take 1 tablet (20 mg total) by mouth daily. 30 tablet 11   Canagliflozin-metFORMIN HCl (INVOKAMET) 50-1000 MG TABS Take 1 tablet by mouth 2 (two) times daily. 180 tablet 1   insulin aspart (NOVOLOG FLEXPEN) 100 UNIT/ML FlexPen Inject 20 Units into the skin 3 (three) times daily with meals. 15 mL 12   Insulin Glargine (BASAGLAR KWIKPEN) 100 UNIT/ML Inject 40 Units into the skin 2 (two) times daily. 15 mL 3   Insulin Pen Needle (PEN NEEDLES) 31G X 5 MM MISC Inject 1 each into the skin 4 (four) times daily -  before meals and at bedtime. 100 each 11   Insulin Pen Needle (TECHLITE PEN NEEDLES) 31G X 5 MM MISC USE 1 NEEDLE 4 TIMES DAILY - BEFORE MEALS AND AT BEDTIME. 100 each 11   Insulin Syringe-Needle U-100 (GNP INSULIN SYRINGE) 31G X 5/16" 0.5 ML MISC Use to inject insulin daily as instructed 100 each 3   Lancets MISC Use to check blood sugar as directed 100 each 3   liraglutide (VICTOZA) 18 MG/3ML SOPN Inject 0.6 mg into the  skin daily. 3 mL 11   No current facility-administered medications for this visit.   Family History  Problem Relation Age of Onset   Diabetes Mother    CAD Mother        Died MI 68   Diabetes Father    CAD Father        Died MI 10   Cancer Paternal Grandmother    Social History   Socioeconomic History   Marital status: Married    Spouse name: Not on file   Number of children: Not on file   Years of education: 7   Highest education level: Not on file  Occupational History    Employer:  UNEMPLOYED  Tobacco Use   Smoking status: Never   Smokeless tobacco: Never  Substance and Sexual Activity   Alcohol use: No    Alcohol/week: 0.0 standard drinks of alcohol   Drug use: No   Sexual activity: Not on file  Other Topics Concern   Not on file  Social History Narrative   Financial assistance approved for 100% discount at [facility] and has [facility] card per [name]   12/16/2009   Recently gave birth to a son 04/2009.   Social Determinants of Health   Financial Resource Strain: Not on file  Food Insecurity: Not on file  Transportation Needs: Not on file  Physical Activity: Not on file  Stress: Not on file  Social Connections: Not on file   Review of Systems: Pertinent items noted in HPI and remainder of comprehensive ROS otherwise negative.  Objective:  Physical Exam: Vitals:   10/26/22 1521  BP: 139/73  SpO2: 97%  Weight: 161 lb 1.6 oz (73.1 kg)   BP 139/73 (BP Location: Right Arm, Patient Position: Sitting, Cuff Size: Normal)   Wt 161 lb 1.6 oz (73.1 kg)   SpO2 97%   BMI 31.46 kg/m   General appearance: alert and no distress Lungs: clear to auscultation bilaterally, regular work of breathing on room air Heart: regular rate and rhythm, S1, S2 normal, no murmur, click, rub or gallop  Assessment & Plan:   Type 2 diabetes mellitus (HCC) Today, A1c is increased from 10.9 on 5/13 to 12.7. She has recently received a diabetic retinal exam which showed no retinopathy and she does not have any concerns on her foot exam today. She reports that she is taking the following medications as directed: basaglar 40 BID, novalog 20 TID, canagliflozin-metformin 50-1000 BID. She reports that in a typical week she always remembers her basaglar, maybe one day a week forgets her novalog, and has been taking her metformin for the last 15 days. She has been having intermittent diarrhea during these 15 days. Her glucometer readings show readings averaging in the 130s over the last few  weeks and in the 300s prior to October. When her glucose is down to the 90s, she reports feeling shaky and sweaty.   She states that she typically checks her glucose in the mornings.However, the elevated A1c indicates uncontrolled diabetes. At this time, it is unclear if this is a result of decreased adherence, likely due to access, or if her insulin requires further titration with prandial insulin. She is amenable to increasing her daily glucose readings to 2-3 times per day. We set a goal for her to check her fasting glucose daily and at least one blood sugar before a meal prior to next visit.   With reports of diarrhea, we will decrease the dose to 500 BID and see  if this improves  - Increase daily glucose checks to 2 times per day - Start metformin 500mg  BID - Start canagliflozin 100 mg daily - Restart victoza 0.6 mg daily  - Continue basaglar to 40 units BID  - Continue novolog 20 units TID - Follow-up in 2 weeks with glucometer   Hyperlipidemia Atorvastatin 20 refilled. She had not been taking this medication consistently.   Patient discussed with Dr. Molli Barrows.   Thea Alken, MS3

## 2022-10-27 ENCOUNTER — Other Ambulatory Visit (HOSPITAL_COMMUNITY): Payer: Self-pay

## 2022-10-27 MED ORDER — EMPAGLIFLOZIN 10 MG PO TABS
10.0000 mg | ORAL_TABLET | Freq: Every day | ORAL | 3 refills | Status: DC
  Filled 2022-10-27: qty 30, 30d supply, fill #0

## 2022-10-27 NOTE — Progress Notes (Deleted)
Patient seen with student Doctor Thea Alken.

## 2022-10-27 NOTE — Telephone Encounter (Signed)
Still awaiting returned applications for assistance.   Will discontinue Lantus assistance since pt is now receiving [facility] basaglar at [facility] pharmacy.  Pt was recently prescribed invokana. This med is no longer on the IM Program. I will mail [employer] application to patients home.   Would you like to send Jardiance to bridge the patient over while we work on assistance? They are able to do a one time fill as long as patient will get assistance paperwork turned back in.

## 2022-10-27 NOTE — Telephone Encounter (Signed)
Dr Sloan Leiter changing Robin Klein to Robin Klein. She will reach out to pt to inform her of all paperwork needing to be returned.   Will mail [employer] application.

## 2022-10-27 NOTE — Addendum Note (Signed)
Addended by: Lucille Passy on: 10/27/2022 02:23 PM   Modules accepted: Orders

## 2022-10-29 ENCOUNTER — Other Ambulatory Visit: Payer: Self-pay

## 2022-10-29 ENCOUNTER — Other Ambulatory Visit: Payer: Self-pay | Admitting: Internal Medicine

## 2022-10-29 ENCOUNTER — Other Ambulatory Visit (HOSPITAL_COMMUNITY): Payer: Self-pay

## 2022-10-29 DIAGNOSIS — Z1231 Encounter for screening mammogram for malignant neoplasm of breast: Secondary | ICD-10-CM

## 2022-10-29 NOTE — Progress Notes (Signed)
Internal Medicine Clinic Attending  Case discussed with the resident at the time of the visit.  We reviewed the resident's history and exam and pertinent patient test results.  I agree with the assessment, diagnosis, and plan of care documented in the resident's note.     Patient would benefit from more pharmacy and nursing support for adherence and understanding of her diabetes

## 2022-10-29 NOTE — Addendum Note (Signed)
Addended by: Derrek Monaco on: 10/29/2022 11:00 AM   Modules accepted: Orders, Level of Service

## 2022-10-30 ENCOUNTER — Telehealth: Payer: Self-pay | Admitting: *Deleted

## 2022-10-30 NOTE — Progress Notes (Signed)
  Care Coordination  Outreach Note  10/30/2022 Name: Robin Klein MRN: [id number] DOB: 1970-07-12   Care Coordination Outreach Attempts:  An unsuccessful telephone outreach was attempted today using [employer] ID# [phone] named Lafonda Mosses to offer the patient information about available care coordination services.  Follow Up Plan:  Additional outreach attempts will be made to offer the patient care coordination information and services.   Encounter Outcome:  No Answer  Gwenevere Ghazi  Care Coordination Care Guide  Direct Dial: [phone]

## 2022-11-02 ENCOUNTER — Other Ambulatory Visit: Payer: Self-pay

## 2022-11-03 NOTE — Telephone Encounter (Signed)
Rec'd completed patient page for novo nordisk application (Victoza and Novolog).   In process of getting provider pages completed.

## 2022-11-03 NOTE — Progress Notes (Signed)
  Care Coordination  Outreach Note  11/03/2022 Name: Robin Klein MRN: [id number] DOB: 1970-05-25   Care Coordination Outreach Attempts: A second unsuccessful outreach was attempted today to offer the patient with information about available care coordination services. Used [employer] named [place] ID # [id number]  Follow Up Plan:  Additional outreach attempts will be made to offer the patient care coordination information and services.   Encounter Outcome:  No Answer  Gwenevere Ghazi  Care Coordination Care Guide  Direct Dial: [phone]

## 2022-11-09 ENCOUNTER — Encounter: Payer: Self-pay | Admitting: Dietician

## 2022-11-09 ENCOUNTER — Other Ambulatory Visit: Payer: Self-pay

## 2022-11-09 ENCOUNTER — Other Ambulatory Visit: Payer: Self-pay | Admitting: Internal Medicine

## 2022-11-09 DIAGNOSIS — E1165 Type 2 diabetes mellitus with hyperglycemia: Secondary | ICD-10-CM

## 2022-11-09 MED ORDER — CONTOUR NEXT TEST VI STRP
ORAL_STRIP | 12 refills | Status: DC
  Filled 2022-11-09: qty 100, 30d supply, fill #0
  Filled 2023-01-25: qty 100, 30d supply, fill #1

## 2022-11-09 NOTE — Progress Notes (Signed)
  Care Coordination   Note   11/09/2022 Name: Robin Klein MRN: [id number] DOB: Feb 09, 1970  Robin Klein is a 52 y.o. year old female who sees [name], MD for primary care. I reached out to [name] by phone today to offer care coordination services.  Robin Klein was given information about Care Coordination services today including:   The Care Coordination services include support from the care team which includes your Nurse Coordinator, Clinical Social Worker, or Pharmacist.  The Care Coordination team is here to help remove barriers to the health concerns and goals most important to you. Care Coordination services are voluntary, and the patient may decline or stop services at any time by request to their care team member.   Care Coordination Consent Status: Patient agreed to services and verbal consent obtained.   Follow up plan:  Telephone appointment with care coordination team member scheduled for:  11/12 with PharmD and 11/15 with RNCM   Encounter Outcome:  Patient Scheduled  [facility]  Care Coordination Care Guide  Direct Dial: [phone]

## 2022-11-10 ENCOUNTER — Other Ambulatory Visit: Payer: Self-pay

## 2022-11-11 NOTE — Telephone Encounter (Signed)
Novo nordisk provider pages placed in Dr. Mayford Knife box for signature.

## 2022-11-12 NOTE — Telephone Encounter (Signed)
Gotcha, thank you!

## 2022-11-13 ENCOUNTER — Ambulatory Visit (INDEPENDENT_AMBULATORY_CARE_PROVIDER_SITE_OTHER): Payer: Self-pay | Admitting: Internal Medicine

## 2022-11-13 ENCOUNTER — Other Ambulatory Visit: Payer: Self-pay

## 2022-11-13 ENCOUNTER — Encounter: Payer: Self-pay | Admitting: Internal Medicine

## 2022-11-13 ENCOUNTER — Other Ambulatory Visit (HOSPITAL_COMMUNITY): Payer: Self-pay

## 2022-11-13 VITALS — BP 131/79 | HR 92 | Temp 98.0°F | Wt 160.7 lb

## 2022-11-13 DIAGNOSIS — Z794 Long term (current) use of insulin: Secondary | ICD-10-CM

## 2022-11-13 DIAGNOSIS — E119 Type 2 diabetes mellitus without complications: Secondary | ICD-10-CM

## 2022-11-13 DIAGNOSIS — L299 Pruritus, unspecified: Secondary | ICD-10-CM

## 2022-11-13 MED ORDER — NOVOLOG FLEXPEN 100 UNIT/ML ~~LOC~~ SOPN
20.0000 [IU] | PEN_INJECTOR | Freq: Three times a day (TID) | SUBCUTANEOUS | 12 refills | Status: DC
  Filled 2022-11-13: qty 15, 25d supply, fill #0

## 2022-11-13 MED ORDER — BASAGLAR KWIKPEN 100 UNIT/ML ~~LOC~~ SOPN
44.0000 [IU] | PEN_INJECTOR | Freq: Two times a day (BID) | SUBCUTANEOUS | 3 refills | Status: DC
  Filled 2022-11-13: qty 15, 17d supply, fill #0
  Filled 2022-12-16: qty 30, 34d supply, fill #0
  Filled 2023-01-25: qty 30, 34d supply, fill #1

## 2022-11-13 MED ORDER — BASAGLAR KWIKPEN 100 UNIT/ML ~~LOC~~ SOPN
44.0000 [IU] | PEN_INJECTOR | Freq: Two times a day (BID) | SUBCUTANEOUS | 3 refills | Status: DC
  Filled 2022-11-13: qty 15, 17d supply, fill #0

## 2022-11-13 MED ORDER — LIRAGLUTIDE 18 MG/3ML ~~LOC~~ SOPN
1.2000 mg | PEN_INJECTOR | Freq: Every day | SUBCUTANEOUS | 3 refills | Status: DC
  Filled 2022-11-13: qty 3, 15d supply, fill #0

## 2022-11-13 NOTE — Telephone Encounter (Signed)
All medications sent today (novolog, victoza, and basaglar) are no longer on the IM PROGRAM and some cannot be filled.  Assistance is currently pending for novolog and victoza (novo nordisk application is in Dr. Mayford Knife box awaiting signature).   Basaglar can be sent to [facility] pharmacy with a note to the pharmacy to fill for "DOH". This will be free for the patient.

## 2022-11-13 NOTE — Telephone Encounter (Signed)
Understood, thank you. 

## 2022-11-13 NOTE — Patient Instructions (Addendum)
Robin Klein,   Fue un placer conocerte hoy.   Tome 44 unidades de [employer] veces al da. Aumente tambin su Victoza a 1,2 mg.   Para la picazn, te recomiendo probar Benadryl o Zyrtec.   Planearemos volver a verlo en aproximadamente dos semanas para revisar su glucmetro nuevamente.   Gracias! Dr. Brien Mates

## 2022-11-13 NOTE — Assessment & Plan Note (Signed)
Patient endorses mild itching of her hands, feet, and abdomen for the past week. Denies any new home products. Cortisone cream has been mildly effective. No skin changes on exam, abdominal area has some excoriations. Recommended benadryl or zyrtec for symptomatic relief, will reevaluate at next visit.

## 2022-11-13 NOTE — Addendum Note (Signed)
Addended by: Monna Fam on: 11/13/2022 11:30 AM   Modules accepted: Orders

## 2022-11-13 NOTE — Assessment & Plan Note (Addendum)
Patient here for 2 week follow up of T2DM. A1c at last visit was 12.7. Current regimen includes metformin 500mg  BID, Jardiance 10mg , Victoza 0.6, Basaglar 40 BID, Novolog 20 TID. Jardiance and Victoza were started at last visit. Glucometer interrogation shows consistently high blood sugar readings, 70% outside of target range. Patient denies nausea or GI symptoms. She has not had symptoms of hypoglycemia.  -Increase Basaglar to 44 units BID -Increase Victoza to 1.2mg  daily -Follow up in two weeks to check glucometer

## 2022-11-13 NOTE — Progress Notes (Signed)
Subjective:  CC: DM follow up  HPI:  [name] is a 52 y.o. female with a past medical history stated below and presents today for above. Please see problem based assessment and plan for additional details.  Past Medical History:  Diagnosis Date   Depression    Diabetes mellitus    Fatty liver disease, nonalcoholic    Hypertension    Sinusitis     Current Outpatient Medications on File Prior to Visit  Medication Sig Dispense Refill   amLODipine (NORVASC) 10 MG tablet Take 1 tablet (10 mg total) by mouth daily. 90 tablet 1   atorvastatin (LIPITOR) 20 MG tablet Take 1 tablet (20 mg total) by mouth daily. 90 tablet 3   empagliflozin (JARDIANCE) 10 MG TABS tablet Take 1 tablet (10 mg total) by mouth daily before breakfast. 30 tablet 3   glucose blood (CONTOUR NEXT TEST) test strip CHECK  BLOOD SUGARS FOUR TIMES DAILY - FIRST THING IN THE MORNING AND BEFORE MEALS. 100 strip 12   Insulin Pen Needle (PEN NEEDLES) 31G X 5 MM MISC Inject 1 each into the skin 4 (four) times daily -  before meals and at bedtime. 100 each 11   Insulin Pen Needle (TECHLITE PEN NEEDLES) 31G X 5 MM MISC USE 1 NEEDLE 4 TIMES DAILY - BEFORE MEALS AND AT BEDTIME. 100 each 11   Insulin Syringe-Needle U-100 (GNP INSULIN SYRINGE) 31G X 5/16" 0.5 ML MISC Use to inject insulin daily as instructed 100 each 3   Lancets MISC Use to check blood sugar as directed 100 each 3   metFORMIN (GLUCOPHAGE) 500 MG tablet Take 1 tablet (500 mg total) by mouth 2 (two) times daily with a meal. 60 tablet 3   No current facility-administered medications on file prior to visit.    Review of Systems: ROS negative except for as is noted on the assessment and plan.  Objective:   Vitals:   11/13/22 0840  BP: 131/79  Pulse: 92  Temp: 98 F (36.7 C)  TempSrc: Oral  SpO2: 99%  Weight: 160 lb 11.2 oz (72.9 kg)    Physical Exam: Constitutional: well-appearing, in no acute distress HENT: normocephalic atraumatic,  mucous membranes moist Eyes: conjunctiva non-erythematous Neck: supple Cardiovascular: regular rate and rhythm, no m/r/g Pulmonary/Chest: normal work of breathing on room air, lungs clear to auscultation bilaterally Abdominal: soft, non-tender, non-distended MSK: normal bulk and tone Neurological: alert & oriented x 3, 5/5 strength in bilateral upper and lower extremities, normal gait Skin: warm and dry  Assessment & Plan:   Type 2 diabetes mellitus (HCC) Patient here for 2 week follow up of T2DM. A1c at last visit was 12.7. Current regimen includes metformin 500mg  BID, Jardiance 10mg , Victoza 0.6, Basaglar 40 BID, Novolog 20 TID. Jardiance and Victoza were started at last visit. Glucometer interrogation shows consistently high blood sugar readings, 70% outside of target range. Patient denies nausea or GI symptoms. She has not had symptoms of hypoglycemia.  -Increase Basaglar to 44 units BID -Increase Victoza to 1.2mg  daily -Follow up in two weeks to check glucometer  Pruritus Patient endorses mild itching of her hands, feet, and abdomen for the past week. Denies any new home products. Cortisone cream has been mildly effective. No skin changes on exam, abdominal area has some excoriations. Recommended benadryl or zyrtec for symptomatic relief, will reevaluate at next visit.     Patient seen with Dr. Cleda Daub  Monna Fam MD [facility] Internal Medicine  PGY-1 Pager: [phone] Date 11/13/2022  Time 9:23 AM

## 2022-11-17 ENCOUNTER — Telehealth: Payer: Self-pay | Admitting: Pharmacist

## 2022-11-17 ENCOUNTER — Other Ambulatory Visit: Payer: Self-pay

## 2022-11-17 ENCOUNTER — Other Ambulatory Visit: Payer: Self-pay | Admitting: Pharmacist

## 2022-11-17 ENCOUNTER — Other Ambulatory Visit: Payer: Self-pay | Admitting: Student

## 2022-11-17 DIAGNOSIS — E119 Type 2 diabetes mellitus without complications: Secondary | ICD-10-CM

## 2022-11-17 MED ORDER — INSULIN LISPRO 100 UNIT/ML IJ SOLN
20.0000 [IU] | Freq: Three times a day (TID) | INTRAMUSCULAR | 11 refills | Status: DC
  Filled 2022-11-17: qty 10, 17d supply, fill #0
  Filled 2022-12-16: qty 20, 34d supply, fill #1
  Filled 2023-01-25: qty 20, 34d supply, fill #2
  Filled 2023-05-05: qty 20, 34d supply, fill #3
  Filled 2023-06-07: qty 20, 34d supply, fill #4

## 2022-11-17 NOTE — Progress Notes (Signed)
11/17/2022 Name: Robin Klein MRN: [id number] DOB: 07/28/1970  Chief Complaint  Patient presents with   Diabetes   Medication Management    Robin Klein is a 52 y.o. year old female who presented for a telephone visit.   They were referred to the pharmacist by their PCP for assistance in managing diabetes and medication access.    Subjective:  Care Team: Primary Care Provider: [name], MD ; Next Scheduled Visit: 11/30/22 Clinical Pharmacist: Marlowe Aschoff, PharmD  Medication Access/Adherence  Current Pharmacy:  [facility] [address] [place], [address] [place] [address] Phone: [phone] Fax: [phone]  [place] - [facility] [address] [place] [address] Phone: [phone] Fax: [phone]  [facility] - [facility] [address] [place] [address] Phone: [phone] Fax: [phone]  [facility] [place], [place] - [address] [place] [address] Phone: [phone] Fax: [phone]   Patient reports affordability concerns with their medications: Yes  Patient reports access/transportation concerns to their pharmacy: No  Patient reports adherence concerns with their medications:  No     Diabetes:  Current medications: Basaglar 44 U twice a day, Novolog 20 U with meals, Metformin 500mg  twice a day, Jardiance 10 mg daily, Victoza 1.2mg  daily Medications tried in the past: Trulicity (cost)  Current glucose readings: 8 AM 309, 11AM 273, 8 PM 231, 7 AM 283  Patient denies hypoglycemic s/sx including dizziness, shakiness, sweating. Patient denies hyperglycemic symptoms including polyuria, polydipsia, polyphagia, nocturia, neuropathy, blurred vision.   Current medication access support: No insurance- [facility]  card   Objective:  Lab Results  Component Value Date   HGBA1C 12.7 (A) 10/26/2022    Lab Results  Component Value Date   CREATININE 0.57 02/13/2022   BUN 10 02/13/2022   NA 142 02/13/2022   K 4.0 02/13/2022   CL 102 02/13/2022   CO2 21 02/13/2022    Lab Results  Component Value Date   CHOL 200 (H) 02/13/2022   HDL 47 02/13/2022   LDLCALC 119 (H) 02/13/2022   TRIG 192 (H) 02/13/2022   CHOLHDL 4.3 02/13/2022    Medications Reviewed Today     Reviewed by Pollie Friar, RPH (Pharmacist) on 11/17/22 at 1242  Med List Status: <None>   Medication Order Taking? Sig Documenting Provider Last Dose Status Informant  amLODipine (NORVASC) 10 MG tablet [id number] Yes Take 1 tablet (10 mg total) by mouth daily. [name], MD Taking Active   atorvastatin (LIPITOR) 20 MG tablet [id number] Yes Take 1 tablet (20 mg total) by mouth daily. Masters, Psychiatric nurse, DO Taking Active   empagliflozin (JARDIANCE) 10 MG TABS tablet [id number] Yes Take 1 tablet (10 mg total) by mouth daily before breakfast. Masters, Florentina Addison, DO Taking Active   glucose blood (CONTOUR NEXT TEST) test strip [id number] Yes CHECK  BLOOD SUGARS FOUR TIMES DAILY - FIRST THING IN THE MORNING AND BEFORE MEALS. [name], MD Taking Active   insulin aspart (NOVOLOG FLEXPEN) 100 UNIT/ML FlexPen [id number] Yes Inject 20 Units into the skin 3 (three) times daily with meals. Monna Fam, MD Taking Active   Insulin Glargine [facility]) 100 UNIT/ML [id number] Yes Inject 44 Units into the skin 2 (two) times daily. Monna Fam, MD Taking Active   Insulin Pen Needle (PEN NEEDLES) 31G X 5 MM MISC [id number] Yes Inject 1 each into the  skin 4 (four) times daily -  before meals and at bedtime. Burna Cash, MD Taking Active   Insulin Pen Needle (TECHLITE PEN NEEDLES) 31G X 5 MM MISC [id number] Yes USE 1 NEEDLE 4 TIMES DAILY - BEFORE MEALS AND AT BEDTIME. [name], MD Taking Active   Insulin  Syringe-Needle U-100 (GNP INSULIN SYRINGE) 31G X 5/16" 0.5 ML MISC [id number] Yes Use to inject insulin daily as instructed Verdene Lennert, MD Taking Active   Lancets MISC [id number] Yes Use to check blood sugar as directed Masters, Florentina Addison, DO Taking Active   liraglutide (VICTOZA) 18 MG/3ML SOPN [id number] Yes Inject 1.2 mg into the skin daily. Monna Fam, MD Taking Active            Med Note Lorenso Courier, Master Touchet M   Tue Nov 17, 2022 11:54 AM) 2PM  metFORMIN (GLUCOPHAGE) 500 MG tablet [id number] Yes Take 1 tablet (500 mg total) by mouth 2 (two) times daily with a meal. Masters, Florentina Addison, DO Taking Active               Assessment/Plan:   Diabetes: - Currently uncontrolled - Reviewed long term cardiovascular and renal outcomes of uncontrolled blood sugar - Reviewed goal A1c, goal fasting, and goal 2 hour post prandial glucose - Recommend to check glucose 2-3 times a day - Meets financial criteria for Novolog, Tresiba, Ozempic + Farxiga patient assistance program through [employer] + [employer]. Will collaborate with provider, CPhT, and patient to pursue assistance.     **Follow Up Plan:**  Talk with Camille (MAP) about getting Novolog, Evaristo Bury, pen needles, and Ozempic from [employer]; then switching Jardiance to [place] for [place] PAP as well Patient needs Novolog soon- ask PCP about switching to Humalog to fill with [facility]  Call pharmacy to refill Amlodipine- 3 tablets left Advised to call [facility] card representative as it expires on 11/26/22 Ask if patient has received Jardiance application in the mail yet and returned??  **For future, would strongly suggest patient comes into the office and demonstrates insulin usage as BG readings are still elevated despite dose increase  **Of note, do NOT call Mondays or Thursdays as she is at church; available after 2PM most days  Marlowe Aschoff, PharmD [facility] Phone Number: [phone]

## 2022-11-17 NOTE — Progress Notes (Addendum)
   11/17/2022  Patient ID: Robin Klein, female   DOB: 14-Mar-1970, 52 y.o.   MRN: [id number]  Tried calling patient with assistance of Spanish interpreter for call today. Unable to reach at this time and left a voicemail requesting call back at earliest convenience.   Marlowe Aschoff, PharmD [facility] Phone Number: [phone]

## 2022-11-18 NOTE — Progress Notes (Signed)
Pharmacy Medication Assistance Program Note    11/18/2022  Patient ID: Robin Klein, female   DOB: 1970/04/21, 52 y.o.   MRN: [id number]     11/18/2022  Outreach Medication One  Manufacturer Medication One [employer] Drugs Novolog  Dose of Novolog 100u/ml Flexpen  Type of Radiographer, therapeutic Assistance  Date Application Received From Provider 11/18/2022  Date Application Submitted to Manufacturer 11/18/2022  Method Application Sent to Manufacturer Fax           11/18/2022  Outreach Medication Two  Manufacturer Medication Two Novo Nordisk  [employer]ord[employer] Drugs Victoza  Dose of Victoza 1.2mg   Type of Sport and exercise psychologist  Date Application Received From Provider 11/18/2022  Method Application Sent to Manufacturer Fax  Date Application Submitted to Manufacturer 11/18/2022       Siri Cole, CphT Pharmacy Patient Advocate

## 2022-11-19 ENCOUNTER — Other Ambulatory Visit (HOSPITAL_COMMUNITY): Payer: Self-pay

## 2022-11-19 ENCOUNTER — Other Ambulatory Visit: Payer: Self-pay

## 2022-11-19 NOTE — Progress Notes (Signed)
Internal Medicine Clinic Attending  I was physically present during the key portions of the resident provided service and participated in the medical decision making of patient's management care. I reviewed pertinent patient test results.  The assessment, diagnosis, and plan were formulated together and I agree with the documentation in the resident's note.  Gust Rung, DO

## 2022-11-20 ENCOUNTER — Telehealth: Payer: Self-pay | Admitting: Pharmacist

## 2022-11-20 ENCOUNTER — Ambulatory Visit: Payer: Self-pay

## 2022-11-20 NOTE — Progress Notes (Signed)
   11/20/2022  Patient ID: Robin Klein, female   DOB: 11/01/1970, 52 y.o.   MRN: [id number]  Tried calling patient to follow-up on her medications concerns from the last call. Appears the Novolog was replaced with Humalog at the last visit to be filled at [facility].  However, she picked up an insulin vial and no insulin syringes (just needles). Appears the [facility] program ran out of pens and vials of Humalog at this time.   Will get Basaglar from [facility] in the future. Novolog and Victoza will go through [employer] PAP- waiting for PCP signature per [place]. Jardiance application mailed too, but has not received- need to ask patient about it  For future, consider the suggestions listed in 11/17/22 visit. If she returns call, need to ask about Humalog vial (send needles in), Jardiance PAP application, and having patient demonstrate insulin injection at PCP visit on 11/25.    Marlowe Aschoff, PharmD [facility] Phone Number: [phone]

## 2022-11-20 NOTE — Patient Outreach (Signed)
Care Coordination   Initial Visit Note   11/20/2022 Name: Robin Klein MRN: [id number] DOB: [birth year]  Robin Klein is a 52 y.o. year old female who sees [name], MD for primary care. I spoke with  Robin Klein by phone today.  What matters to the patients health and wellness today?  I had a conversation with Robin Klein, a patient diagnosed with diabetes. During our discussion, I noted that her A1C level was recorded at 12.7 on October 21st, indicating a need for improvement. Since her last visit to the doctor's office, she has consistently monitored her blood sugar levels, checking them two to three times daily. This morning, at 8:20 AM, she performed a fasting blood sugar test, which yielded a result of 151 mg/dL. I advised her on the importance of reducing this value for her overall health. We also reviewed her food practices, during which she stated that Lupita Leash, the dietitian at the office, had provided her with guidance regarding food intake. Furthermore, we examined her medication regimen, and she confirmed that she has experienced no difficulties with her prescribed medications. We will continue this discussion and address additional concerns during our next scheduled call.      Goals Addressed               This Visit's Progress     Diabetes Patient stated goal- To lower my A1c (pt-stated)        Patient Goals/Self Care Activities: -Patient/Caregiver will take medications as prescribed   -Patient/Caregiver will attend all scheduled provider appointments -Patient/Caregiver will call pharmacy for medication refills 3-7 days in advance of running out of medications -Patient/Caregiver will call provider office for new concerns or questions  -Patient/Caregiver will focus on medication adherence by taking medications as prescribed   Reviewed medications with patient and discussed importance of medication  adherence Discussed plans with patient for ongoing care management follow up and provided patient with direct contact information for care management team Advised patient, providing education and rationale, to check cbg and record, calling for findings outside established parameters Review of patient status, including review of consultants reports, relevant laboratory and other test results, and medications completed Assessed social determinant of health barriers Lab Results  Component Value Date   HGBA1C 12.7 (A) 10/26/2022           SDOH assessments and interventions completed:  Yes  SDOH Interventions Today    Flowsheet Row Most Recent Value  SDOH Interventions   Food Insecurity Interventions Intervention Not Indicated  Housing Interventions Intervention Not Indicated  Transportation Interventions Intervention Not Indicated  Utilities Interventions Intervention Not Indicated        Care Coordination Interventions:  Yes, provided  Interventions Today    Flowsheet Row Most Recent Value  Chronic Disease   Chronic disease during today's visit Diabetes  General Interventions   General Interventions Discussed/Reviewed General Interventions Discussed, General Interventions Reviewed, Durable Medical Equipment (DME), Labs  Labs Hgb A1c every 3 months  Durable Medical Equipment (DME) Glucomoter, BP Cuff  Nutrition Interventions   Nutrition Discussed/Reviewed Nutrition Discussed  Pharmacy Interventions   Pharmacy Dicussed/Reviewed Pharmacy Topics Discussed  Safety Interventions   Safety Discussed/Reviewed Safety Discussed       Follow up plan: Follow up call scheduled for 12/6/245 130 pm    Encounter Outcome:  Patient Visit Completed   Juanell Fairly RN, BSN, [facility]  [facility], [facility]  Care Coordinator Phone: [phone]

## 2022-11-20 NOTE — Patient Instructions (Signed)
Visit Information  Thank you for taking time to visit with me today. Please don't hesitate to contact me if I can be of assistance to you.   Following are the goals we discussed today:   Goals Addressed               This Visit's Progress     Diabetes Patient stated goal- To lower my A1c (pt-stated)        Patient Goals/Self Care Activities: -Patient/Caregiver will take medications as prescribed   -Patient/Caregiver will attend all scheduled provider appointments -Patient/Caregiver will call pharmacy for medication refills 3-7 days in advance of running out of medications -Patient/Caregiver will call provider office for new concerns or questions  -Patient/Caregiver will focus on medication adherence by taking medications as prescribed   Reviewed medications with patient and discussed importance of medication adherence Discussed plans with patient for ongoing care management follow up and provided patient with direct contact information for care management team Advised patient, providing education and rationale, to check cbg and record, calling for findings outside established parameters Review of patient status, including review of consultants reports, relevant laboratory and other test results, and medications completed Assessed social determinant of health barriers Lab Results  Component Value Date   HGBA1C 12.7 (A) 10/26/2022           Our next appointment is by telephone on 12/11/22 at 2 pm  Please call the care guide team at [phone] if you need to cancel or reschedule your appointment.   If you are experiencing a Mental Health or Behavioral Health Crisis or need someone to talk to, please call 1-800-273-TALK (toll free, 24 hour hotline)  The patient verbalized understanding of instructions, educational materials, and care plan provided today and agreed to receive a mailed copy of patient instructions, educational materials, and care plan.   Juanell Fairly RN, BSN,  [facility] [place]  [facility], [facility]  Care Coordinator Phone: [phone]

## 2022-11-23 ENCOUNTER — Other Ambulatory Visit: Payer: Self-pay

## 2022-11-27 ENCOUNTER — Telehealth: Payer: Self-pay | Admitting: Pharmacist

## 2022-11-27 NOTE — Progress Notes (Signed)
   11/27/2022  Patient ID: [name], female   DOB: 1970-12-11, 52 y.o.   MRN: [id number]  Tried calling patient again regarding DM concerns and potentially bringing insulin to appointment with PCP on Monday to show injection usage. Unable to reach at this time. Left a HIPAA compliant voicemail with assistance of interpreter.   Marlowe Aschoff, PharmD [facility] Phone Number: [phone]

## 2022-11-30 ENCOUNTER — Other Ambulatory Visit: Payer: Self-pay

## 2022-11-30 ENCOUNTER — Ambulatory Visit (INDEPENDENT_AMBULATORY_CARE_PROVIDER_SITE_OTHER): Payer: Self-pay | Admitting: Student

## 2022-11-30 VITALS — BP 123/67 | HR 88 | Temp 98.0°F | Ht 60.0 in | Wt 163.1 lb

## 2022-11-30 DIAGNOSIS — E782 Mixed hyperlipidemia: Secondary | ICD-10-CM

## 2022-11-30 DIAGNOSIS — I10 Essential (primary) hypertension: Secondary | ICD-10-CM

## 2022-11-30 DIAGNOSIS — Z794 Long term (current) use of insulin: Secondary | ICD-10-CM

## 2022-11-30 DIAGNOSIS — Z6831 Body mass index (BMI) 31.0-31.9, adult: Secondary | ICD-10-CM

## 2022-11-30 DIAGNOSIS — E669 Obesity, unspecified: Secondary | ICD-10-CM

## 2022-11-30 DIAGNOSIS — Z0000 Encounter for general adult medical examination without abnormal findings: Secondary | ICD-10-CM

## 2022-11-30 DIAGNOSIS — E119 Type 2 diabetes mellitus without complications: Secondary | ICD-10-CM

## 2022-11-30 LAB — GLUCOSE, CAPILLARY: Glucose-Capillary: 186 mg/dL — ABNORMAL HIGH (ref 70–99)

## 2022-11-30 MED ORDER — EMPAGLIFLOZIN 10 MG PO TABS
10.0000 mg | ORAL_TABLET | Freq: Every day | ORAL | 11 refills | Status: DC
  Filled 2022-11-30: qty 15, 15d supply, fill #0
  Filled 2022-11-30: qty 30, 30d supply, fill #0

## 2022-11-30 MED ORDER — METFORMIN HCL 500 MG PO TABS
500.0000 mg | ORAL_TABLET | Freq: Two times a day (BID) | ORAL | 3 refills | Status: DC
  Filled 2022-11-30: qty 60, 30d supply, fill #0

## 2022-11-30 MED ORDER — ATORVASTATIN CALCIUM 20 MG PO TABS
20.0000 mg | ORAL_TABLET | Freq: Every day | ORAL | 11 refills | Status: DC
  Filled 2022-11-30: qty 30, 30d supply, fill #0

## 2022-11-30 NOTE — Assessment & Plan Note (Addendum)
Patient was previously on high doses of victoza and has been slowly downtitrating as she was experiencing severe diarrhea, nausea, and vomiting. With Victoza 1.2 mg she did not experience diarrhea, but continues to experience nausea and vomiting. She Stopped the medication as of last week with resolution of both symptoms. She reports that her fasting BG 136-180. Has not experienced hypoglycemia. Currently denies neuropathic, claudication, or catabolic symptoms.  -Foot exam done today -Patient is on statin -Discontinue Victoza  -Continue Basaglar 44 BID -jardiance sent to [facility] center; patient will need to start application for this though Siri Cole -Continue Mefotmin 500 mg BID

## 2022-11-30 NOTE — Patient Instructions (Addendum)
[name] , gracias por permitirnos ayudarle hoy. Durante esta visita hemos hablado acerca de los siguientes asuntos medicos:   Felicitaciones por el cuidado de su diabetes! Continue trabajando en su alimentacion y ejercicios Continue la insulina basaglar en la misma dosis Continue Jardiance y metformina Pare de tomar Victoza desde que le causo la nausea y la diarrhea  He ordenado los siguientes exmenes de laboratorio:  Lab Orders         Glucose, capillary      Medicamentos:   NO continue tomando los siguientes medicamentos: Medications Discontinued During This Encounter  Medication Reason   liraglutide (VICTOZA) 18 MG/3ML SOPN        Cita de seguimiento: 2 months - en ese momento, le chequeremos su presion arterial, su diabetes, y [place] su examen cervical de papanicolau  Para contactarnos: Esperamos verte la prxima vez. Llame a nuestra clnica al telefono: [phone] si tiene alguna pregunta o inquietud. El mejor horario para llamar es de lunes a viernes de 9 a. m. a 4 p. m. Si es fuera del horario de atencin o durante el fin de [place], llame al nmero principal del hospital y pregunte por el residente de guardia de medicina interna. Si necesita reposicin de medicamentos, por favor notifique a su farmacia con una semana de anticipacin y ellos nos enviarn una solicitud.  Gracias por confiar en nosotros para cuidar de usted!   [name], MD [facility]

## 2022-11-30 NOTE — Assessment & Plan Note (Signed)
Controlled with current amlodipine 10 mg daily -no changes

## 2022-11-30 NOTE — Progress Notes (Unsigned)
Subjective:  CC: diabetes follow up  HPI:  Ms.Avigail Pilling S Tresa Res is a 52 y.o. female with a past medical history stated below and presents today for diabetes follow up. Please see problem based assessment and plan for additional details.  Past Medical History:  Diagnosis Date   Depression    Diabetes mellitus    Fatty liver disease, nonalcoholic    Hypertension    Sinusitis     Current Outpatient Medications on File Prior to Visit  Medication Sig Dispense Refill   amLODipine (NORVASC) 10 MG tablet Take 1 tablet (10 mg total) by mouth daily. 90 tablet 1   glucose blood (CONTOUR NEXT TEST) test strip CHECK  BLOOD SUGARS FOUR TIMES DAILY - FIRST THING IN THE MORNING AND BEFORE MEALS. 100 strip 12   Insulin Glargine (BASAGLAR KWIKPEN) 100 UNIT/ML Inject 44 Units into the skin 2 (two) times daily. 15 mL 3   insulin lispro (HUMALOG) 100 UNIT/ML injection Inject 0.2 mLs (20 Units total) into the skin 3 (three) times daily before meals. 10 mL 11   Insulin Pen Needle (PEN NEEDLES) 31G X 5 MM MISC Inject 1 each into the skin 4 (four) times daily -  before meals and at bedtime. 100 each 11   Insulin Pen Needle (TECHLITE PEN NEEDLES) 31G X 5 MM MISC USE 1 NEEDLE 4 TIMES DAILY - BEFORE MEALS AND AT BEDTIME. 100 each 11   Insulin Syringe-Needle U-100 (GNP INSULIN SYRINGE) 31G X 5/16" 0.5 ML MISC Use to inject insulin daily as instructed 100 each 3   Lancets MISC Use to check blood sugar as directed 100 each 3   No current facility-administered medications on file prior to visit.    Family History  Problem Relation Age of Onset   Diabetes Mother    CAD Mother        Died [place] 77   Diabetes Father    CAD Father        Died [place] 55   Cancer Paternal Grandmother     Social History   Socioeconomic History   Marital status: Married    Spouse name: Not on file   Number of children: Not on file   Years of education: 7   Highest education level: Not on file  Occupational History     Employer: UNEMPLOYED  Tobacco Use   Smoking status: Never   Smokeless tobacco: Never  Substance and Sexual Activity   Alcohol use: No    Alcohol/week: 0.0 standard drinks of alcohol   Drug use: No   Sexual activity: Not on file  Other Topics Concern   Not on file  Social History Narrative   Financial assistance approved for 100% discount at [facility] and has [facility] card per [name]   12/16/2009   Recently gave birth to a son 04/2009.   Social Determinants of Health   Financial Resource Strain: Not on file  Food Insecurity: Food Insecurity Present (11/13/2022)   Hunger Vital Sign    Worried About Running Out of Food in the Last Year: Sometimes true    Ran Out of Food in the Last Year: Never true  Transportation Needs: No Transportation Needs (11/13/2022)   PRAPARE - Administrator, Civil Service (Medical): No    Lack of Transportation (Non-Medical): No  Physical Activity: Not on file  Stress: Not on file  Social Connections: Not on file  Intimate Partner Violence: Not At Risk (11/13/2022)   Humiliation, Afraid, Rape,  and Kick questionnaire    Fear of Current or Ex-Partner: No    Emotionally Abused: No    Physically Abused: No    Sexually Abused: No    Review of Systems: ROS negative except for what is noted on the assessment and plan.  Objective:   Vitals:   11/30/22 1336  BP: 123/67  Pulse: 88  Temp: 98 F (36.7 C)  TempSrc: Oral  SpO2: 100%  Weight: 163 lb 1.6 oz (74 kg)  Height: 5' (1.524 m)    Physical Exam: Constitutional: well-appearing woman sitting in chair, in no acute distress HENT: normocephalic atraumatic, mucous membranes moist Eyes: conjunctiva non-erythematous Neck: supple Cardiovascular: regular rate and rhythm, no m/r/g Pulmonary/Chest: normal work of breathing on room air, lungs clear to auscultation bilaterally Abdominal: soft, non-tender, non-distended MSK: normal bulk and tone Neurological: alert & oriented x 3, 5/5 strength in  bilateral upper and lower extremities, normal gait. Foot exam without abnormalities, full PT and DP pulses, normal monofilament test, and  Skin: warm and dry Psych: Pleasant mood and affect       11/30/2022    3:00 PM  Depression screen PHQ 2/9  Decreased Interest 0  Down, Depressed, Hopeless 0  PHQ - 2 Score 0  Difficult doing work/chores Not difficult at all        No data to display           Assessment & Plan:   Type 2 diabetes mellitus (HCC) Patient was previously on high doses of victoza and has been slowly downtitrating as she was experiencing severe diarrhea, nausea, and vomiting. With Victoza 1.2 mg she did not experience diarrhea, but continues to experience nausea and vomiting. She Stopped the medication as of last week with resolution of both symptoms. She reports that her fasting BG 136-180. Has not experienced hypoglycemia. Currently denies neuropathic, claudication, or catabolic symptoms.  -Foot exam done today -Patient is on statin -Discontinue Victoza  -Continue Basaglar 44 BID -jardiance sent to [facility] center; patient will need to start application for this though [employer] -Continue Mefotmin 500 mg BID  Essential hypertension, benign Controlled with current amlodipine 10 mg daily -no changes  Health care maintenance Deferred pap smear today; will reschedule t for next visit  Obesity (BMI 30-39.9) Discussed lifestyle modifications   Return in about 8 weeks (around 01/25/2023) for diabetes follow up and pap smea.  Patient discussed with Dr. Allyn Kenner, MD [facility]  12/01/2022, 8:55 AM

## 2022-12-01 ENCOUNTER — Other Ambulatory Visit: Payer: Self-pay | Admitting: Pharmacist

## 2022-12-01 ENCOUNTER — Encounter: Payer: Self-pay | Admitting: Student

## 2022-12-01 DIAGNOSIS — E119 Type 2 diabetes mellitus without complications: Secondary | ICD-10-CM

## 2022-12-01 DIAGNOSIS — E669 Obesity, unspecified: Secondary | ICD-10-CM | POA: Insufficient documentation

## 2022-12-01 NOTE — Assessment & Plan Note (Signed)
Deferred pap smear today; will reschedule t for next visit

## 2022-12-01 NOTE — Progress Notes (Signed)
   12/01/2022 Name: Robin Klein MRN: [id number] DOB: 02-17-1970  Chief Complaint  Patient presents with   Diabetes    Robin Klein is a 52 y.o. year old female who presented for a telephone visit.   They were referred to the pharmacist by their PCP for assistance in managing diabetes and medication access.    Subjective:  Care Team: Primary Care Provider: [name], MD ; Next Scheduled Visit: 02/02/23 Clinical Pharmacist: Marlowe Aschoff, PharmD  Medication Access/Adherence  Current Pharmacy:  [facility] [address] [place], [address] [place] [address] Phone: [phone] Fax: [phone]  [place] [facility] [address] [place] [address] Phone: [phone] Fax: [phone]  [facility] - [facility] [address] [place] [address] Phone: [phone] Fax: [phone]  [facility] [place], [place] [address] [place] [address] Phone: [phone] Fax: [phone]   Patient reports affordability concerns with their medications: Yes  Patient reports access/transportation concerns to their pharmacy: No  Patient reports adherence concerns with their medications:  No     Diabetes:  Current medications: Basaglar 44 U twice a day, Humalog 20 U with meals, Metformin 500mg  twice a day, Jardiance 10 mg daily (not currently- needs PAP) Medications tried in the past: Trulicity (cost), Victoza (nausea and vomiting)  Current glucose readings from 11/12 call: 8 AM 309, 11AM 273, 8 PM 231, 7 AM 283  Patient denies hypoglycemic s/sx including dizziness, shakiness, sweating. Patient denies hyperglycemic symptoms including polyuria, polydipsia, polyphagia, nocturia, neuropathy, blurred vision.   Current medication access support: No insurance- [facility] card   Objective:  Lab Results  Component Value Date   HGBA1C 12.7 (A) 10/26/2022    Lab Results  Component Value Date   CREATININE 0.57 02/13/2022   BUN 10 02/13/2022   NA 142 02/13/2022   K 4.0 02/13/2022   CL 102 02/13/2022   CO2 21 02/13/2022    Lab Results  Component Value Date   CHOL 200 (H) 02/13/2022   HDL 47 02/13/2022   LDLCALC 119 (H) 02/13/2022   TRIG 192 (H) 02/13/2022   CHOLHDL 4.3 02/13/2022    Medications Reviewed Today   Medications were not reviewed in this encounter       Assessment/Plan:   Diabetes: - Currently uncontrolled - Reviewed long term cardiovascular and renal outcomes of uncontrolled blood sugar - Reviewed goal A1c, goal fasting, and goal 2 hour post prandial glucose - Recommend to check glucose 2-3 times a day - Meets financial criteria for Novolog, Tresiba, Ozempic + Farxiga patient assistance program through [employer] [place]. Will collaborate with provider, CPhT, and patient to pursue assistance.     **Follow Up Plan:**  - Call patient in 2 weeks to see if Farxiga application has been received in the mail yet- verify okay to switch Jardiance to [place] first - Has been taking the Humalog vial insulin with no issues- purchased syringes separately   **Of note, do NOT call Mondays or Thursdays as she is at church; available after 2PM most days  Marlowe Aschoff, PharmD [facility] Phone Number: [phone]

## 2022-12-01 NOTE — Progress Notes (Signed)
Internal Medicine Clinic Attending  Case discussed with the resident at the time of the visit.  We reviewed the resident's history and exam and pertinent patient test results.  I agree with the assessment, diagnosis, and plan of care documented in the resident's note.  

## 2022-12-01 NOTE — Assessment & Plan Note (Signed)
Discussed lifestyle modifications.

## 2022-12-02 MED ORDER — DAPAGLIFLOZIN PROPANEDIOL 10 MG PO TABS: 10.0000 mg | ORAL_TABLET | Freq: Every day | ORAL | 4 refills | Status: DC

## 2022-12-02 NOTE — Addendum Note (Signed)
Addended by: Pollie Friar on: 12/02/2022 03:41 PM   Modules accepted: Orders

## 2022-12-07 ENCOUNTER — Telehealth: Payer: Self-pay

## 2022-12-07 NOTE — Progress Notes (Deleted)
 Pharmacy Medication Assistance Program Note    12/22/2022  Patient ID: Robin Klein, female  DOB: 1970/03/26, 52 y.o.  MRN:  [id number]     12/07/2022 12/09/2022  Outreach Medication Three  Manufacturer Medication Three  [employer]  [employer] Drugs  Jardiance  Dose of Jardiance  10MG   Type of Forensic scientist Assistance  Date Application Sent to Patient 10/28/2022   Application Items Requested Application   Date Application Sent to Prescriber  12/09/2022  Name of Prescriber  Alixandrea Stancato  Date Application Received From Patient 12/02/2022   Date Application Received From Provider  12/16/2022  Date Application Submitted to Manufacturer  12/22/2022  Method Application Sent to Manufacturer  Fax

## 2022-12-11 ENCOUNTER — Ambulatory Visit: Payer: Self-pay

## 2022-12-12 NOTE — Patient Instructions (Signed)
Visit Information  Thank you for taking time to visit with me today. Please don't hesitate to contact me if I can be of assistance to you.   Following are the goals we discussed today:   Goals Addressed               This Visit's Progress     Diabetes Patient stated goal- To lower my A1c (pt-stated)        Patient Goals/Self Care Activities: -Patient/Caregiver will take medications as prescribed   -Patient/Caregiver will attend all scheduled provider appointments -Patient/Caregiver will call pharmacy for medication refills 3-7 days in advance of running out of medications -Patient/Caregiver will call provider office for new concerns or questions  -Patient/Caregiver will focus on medication adherence by taking medications as prescribed   Reviewed medications with patient and discussed importance of medication adherence Advised patient, providing education and rationale, to check cbg and record, calling for findings outside established parameters Review of patient status, including review of consultants reports, relevant laboratory and other test results, and medications completed Assessed social determinant of health barriers Lab Results  Component Value Date   HGBA1C 12.7 (A) 10/26/2022  ._Reviewed hypoglycemic protocol if you have any lows         Our next appointment is by telephone on 01/14/23 at 2 pm  Please call the care guide team at [phone] if you need to cancel or reschedule your appointment.   If you are experiencing a Mental Health or Behavioral Health Crisis or need someone to talk to, please call 1-800-273-TALK (toll free, 24 hour hotline)  The patient verbalized understanding of instructions, educational materials, and care plan provided today.    Juanell Fairly RN, BSN, [facility] [place]  [facility], [facility] Health  Care Coordinator Phone: [phone]

## 2022-12-12 NOTE — Patient Outreach (Signed)
  Care Coordination   Follow Up Visit Note   12/11/2022 Name: Robin Klein MRN: [id number] DOB: 10/14/1970  Robin Klein is a 52 y.o. year old female who sees [name], MD for primary care. I spoke with  Robin Klein by phone today.  What matters to the patients health and wellness today?  I called and spoke with Mrs. Robin Klein using the assistance of an interpreter, number [phone]. Mrs. [name] reported that her blood sugar levels had been 137 and 160 and had dropped as low as 100. At that point, she experienced some lightheadedness. I explained that her body was not accustomed to these lower numbers because it was used to her previously high blood sugar levels. I asked her if she knew what to do if her blood sugar levels dropped further, emphasizing that we didn't want them to fall below 70. We reviewed the hypoglycemic protocol, and she confirmed that she understood the necessary steps. We scheduled another appointment to continue our discussion about her diabetes management.    Goals Addressed               This Visit's Progress     Diabetes Patient stated goal- To lower my A1c (pt-stated)        Patient Goals/Self Care Activities: -Patient/Caregiver will take medications as prescribed   -Patient/Caregiver will attend all scheduled provider appointments -Patient/Caregiver will call pharmacy for medication refills 3-7 days in advance of running out of medications -Patient/Caregiver will call provider office for new concerns or questions  -Patient/Caregiver will focus on medication adherence by taking medications as prescribed   Reviewed medications with patient and discussed importance of medication adherence Advised patient, providing education and rationale, to check cbg and record, calling for findings outside established parameters Review of patient status, including review of consultants reports, relevant laboratory and other  test results, and medications completed Assessed social determinant of health barriers Lab Results  Component Value Date   HGBA1C 12.7 (A) 10/26/2022  ._Reviewed hypoglycemic protocol if you have any lows         SDOH assessments and interventions completed:  No     Care Coordination Interventions:  Yes, provided   Interventions Today    Flowsheet Row Most Recent Value  Chronic Disease   Chronic disease during today's visit Diabetes  General Interventions   General Interventions Discussed/Reviewed General Interventions Discussed, General Interventions Reviewed  Education Interventions   Education Provided Provided Education  Nutrition Interventions   Nutrition Discussed/Reviewed Nutrition Discussed  Pharmacy Interventions   Pharmacy Dicussed/Reviewed Pharmacy Topics Discussed  Safety Interventions   Safety Discussed/Reviewed Safety Discussed        Follow up plan: Follow up call scheduled for 01/14/23  2 pm    Encounter Outcome:  Patient Visit Completed   Juanell Fairly RN, BSN, [facility] [place]  [facility], [facility] Health  Care Coordinator Phone: [phone]

## 2022-12-15 ENCOUNTER — Other Ambulatory Visit: Payer: Self-pay | Admitting: Pharmacist

## 2022-12-15 DIAGNOSIS — E119 Type 2 diabetes mellitus without complications: Secondary | ICD-10-CM

## 2022-12-15 NOTE — Progress Notes (Signed)
12/15/2022 Name: Robin Klein MRN: [id number] DOB: [birth year]  Chief Complaint  Patient presents with   Diabetes    Robin Klein is a 52 y.o. year old female who presented for a telephone visit.   They were referred to the pharmacist by their PCP for assistance in managing diabetes and medication access.    Subjective:  Care Team: Primary Care Provider: [name], MD ; Next Scheduled Visit: 02/02/23 Clinical Pharmacist: Marlowe Aschoff, PharmD  Medication Access/Adherence  Current Pharmacy:  [facility] [address] [place], [address] [place] [address] Phone: [phone] Fax: [phone]  [place] [facility] [address] [place] [address] Phone: [phone] Fax: [phone]  [facility] - [facility] [address], [address] [place] [address] Phone: [phone] Fax: [phone]  [facility] - [place], [place] - [address] [place] [address] Phone: [phone] Fax: [phone]  [facility] - [place], [place] - [address] [place] [address] [place] [address] Phone: [phone] Fax: [phone]   Patient reports affordability concerns with their medications: Yes  Patient reports access/transportation concerns to their pharmacy: No  Patient reports adherence concerns with their medications:  No     Diabetes:  Current medications: Basaglar 44 U twice a day, Humalog 20 U with meals, Metformin 500mg  twice a day Medications tried in the past: Trulicity (cost), Victoza (nausea and vomiting), Jardiance (cost)  - Reports good adherence to medications and insulin dosing  12/13 call: 9AM 215, 3:20PM 300, 9PM 280, 9:45AM 175, 10:30AM 140, 10:30AM 165, 9:30AM 175, 3PM 134, 12PM 109, 4AM 118 (felt bad), 187 at night  Summary:  Fasting: 215, 280, 175,  140, 165, 175 Around lunch: 109 Prior to dinner: 300, 134 Nighttime: 118, 187  Current glucose readings from 12/10: All fasting- 137, 119, 189, 220, 166, 305  Current glucose readings from 11/12 call: 8 AM 309, 11AM 273, 8 PM 231, 7 AM 283  90 has been the lowest reading; 98 in the afternoon once  Patient denies hypoglycemic s/sx including dizziness, shakiness, sweating. Patient denies hyperglycemic symptoms including polyuria, polydipsia, polyphagia, nocturia, neuropathy, blurred vision.  Current meals: Breakfast (9AM): Homemade juice drink, 1 egg with tomato + onions, occasional toast Lunch (11AM): [place] sandwich, coconut milk Dinner (3-4PM ): Chicken breast + vegetables, rice + vegetables Snacks: Bread with milk or fruit, cucumber, tomato Drinks: Water, coconut milk   Current medication access support: No insurance- [facility] card   Objective:  Lab Results  Component Value Date   HGBA1C 12.7 (A) 10/26/2022    Lab Results  Component Value Date   CREATININE 0.57 02/13/2022   BUN 10 02/13/2022   NA 142 02/13/2022   K 4.0 02/13/2022   CL 102 02/13/2022   CO2 21 02/13/2022    Lab Results  Component Value Date   CHOL 200 (H) 02/13/2022   HDL 47 02/13/2022   LDLCALC 119 (H) 02/13/2022   TRIG 192 (H) 02/13/2022   CHOLHDL 4.3 02/13/2022    Medications Reviewed Today   Medications were not reviewed in this encounter       Assessment/Plan:   Diabetes: - Currently uncontrolled - Reviewed long term cardiovascular and renal outcomes of uncontrolled blood sugar - Reviewed goal A1c, goal fasting, and goal 2 hour post prandial glucose - Recommend to check glucose 2-3 times a  day and writing down the timeframe she checks it - Extensive dietary counseling provided today to avoid milk + fruit prior to bedtime and limiting rice + breads; juice in the morning is NOT helping either  *For Farxiga, patient was denied as they recommended her to [facility]  Medicaid due to income reported- however when asked patient if she was a [place] resident and had citizenship status, she said no (therefore will NOT qualify for [place] Medicaid)   **Follow Up Plan:**  - Call patient in 1 week for BG readings with the timeframes listed- need to figure out how to adjust insulin; main concern is that she varies her meals and times so unknown how to adjust insulin properly - Would strongly suggest getting a CGM placed at next office visit if phone is compatible- note added - Ask PCP about next steps which are adding Trulicity or increasing Metformin dose  **Of note, do NOT call Mondays or Thursdays as she is at church- available after 2PM; 4PM on Fridays; anytime on Tues and Wednesday  Update from 12/13 call: - BG readings continue to look very sporadic - Recommend patient increase Metformin to 500mg  2 tablets in AM + 2 tablets in PM; if GI issues, change to XR formulation- new Rx sent - Would be willing to trial Trulicity in the future *For future, 70/30 insulin may be a better option as BG readings could be easier to obtain prior to taking next dose if needed and better adherence  Marlowe Aschoff, PharmD [facility] Phone Number: [phone]

## 2022-12-16 ENCOUNTER — Other Ambulatory Visit: Payer: Self-pay

## 2022-12-18 ENCOUNTER — Other Ambulatory Visit (HOSPITAL_COMMUNITY): Payer: Self-pay

## 2022-12-18 MED ORDER — METFORMIN HCL 500 MG PO TABS
1000.0000 mg | ORAL_TABLET | Freq: Two times a day (BID) | ORAL | 3 refills | Status: DC
  Filled 2022-12-18 – 2023-01-18 (×2): qty 120, 30d supply, fill #0

## 2022-12-21 ENCOUNTER — Other Ambulatory Visit (HOSPITAL_COMMUNITY): Payer: Self-pay

## 2023-01-01 NOTE — Telephone Encounter (Signed)
Rec'd fax from [facility].   Proof of income needed.

## 2023-01-05 ENCOUNTER — Other Ambulatory Visit (HOSPITAL_COMMUNITY): Payer: Self-pay

## 2023-01-05 ENCOUNTER — Other Ambulatory Visit: Payer: Self-pay | Admitting: Pharmacist

## 2023-01-05 NOTE — Progress Notes (Signed)
 01/05/2023 Name: Robin Klein MRN: [id number] DOB: 02-16-1970  Chief Complaint  Patient presents with   Diabetes    Robin Klein is a 52 y.o. year old female who presented for a telephone visit.   They were referred to the pharmacist by their PCP for assistance in managing diabetes and medication access.    Subjective:  Care Team: Primary Care Provider: [name], MD ; Next Scheduled Visit: 02/02/23 Clinical Pharmacist: Aloysius Breeding, PharmD  Medication Access/Adherence  Current Pharmacy:  [facility] [address] [place] [address] Phone: [phone] Fax: [phone]  [facility] - [facility] [address] [place] [address] Phone: [phone] Fax: [phone]  [facility] - [place], [place] - [address] [place] [address] Phone: [phone] Fax: [phone]  [facility] [place], [place] - [address] [place] [address] [place] [address] Phone: [phone] Fax: [phone]   Patient reports affordability concerns with their medications: Yes  Patient reports access/transportation concerns to their pharmacy: No  Patient reports adherence concerns with their medications:  No     Diabetes:  Current medications: Basaglar  44 U twice a day, Humalog  20 U with meals, Metformin  500mg  (2000mg  max dose daily) Medications tried in the past: Trulicity  (cost), Victoza  (nausea and vomiting), Jardiance  (cost)  Denied for PAP for: Farxiga , Jardiance ?  - Reports good adherence to medications and insulin  dosing  12/31 call: 161 2PM, 326 4PM, 307 10AM, 141 3PM, 290 8AM, 264 9AM, 398 9AM, 312 11:30AM, 347 2PM, 321 2PM, 313 11AM, 298 8PM, 286 9AM, 406 11AM, 360 11AM, 265 11:30AM, 317 2PM, 306 6PM, 333 10AM  Summary: 8-11AM: 290, 264, 307, 398, 312, 313, 286, 406, 360, 265,  333 Around 2-4PM: 326, 161, 141, 347, 321, 317 6-8PM: 298, 306  **Readings are much higher this time- patient reports to eating worse due to the holidays recently  12/13 call: 9AM 215, 3:20PM 300, 9PM 280, 9:45AM 175, 10:30AM 140, 10:30AM 165, 9:30AM 175, 3PM 134, 12PM 109, 4AM 118 (felt bad), 187 at night  Summary:  Fasting: 215, 280, 175, 140, 165, 175 Around lunch: 109 Prior to dinner: 300, 134 Nighttime: 118, 187  Current glucose readings from 12/10: All fasting- 137, 119, 189, 220, 166, 305  Current glucose readings from 11/12 call: 8 AM 309, 11AM 273, 8 PM 231, 7 AM 283  Patient denies hypoglycemic s/sx including dizziness, shakiness, sweating. Patient denies hyperglycemic symptoms including polyuria, polydipsia, polyphagia, nocturia, neuropathy, blurred vision.  Current meals: Breakfast (9AM): Homemade juice drink, 1 egg with tomato + onions, occasional toast Lunch (11AM): Turkey sandwich, coconut milk Dinner (3-4PM ): Chicken breast + vegetables, rice + vegetables Snacks: Bread with milk or fruit, cucumber, tomato Drinks: Water, coconut milk   Current medication access support: No insurance- [facility] card   Objective:  Lab Results  Component Value Date   HGBA1C 12.7 (A) 10/26/2022    Lab Results  Component Value Date   CREATININE 0.57 02/13/2022   BUN 10 02/13/2022   NA 142 02/13/2022   K 4.0 02/13/2022   CL 102 02/13/2022   CO2 21 02/13/2022    Lab Results  Component Value Date   CHOL 200 (H) 02/13/2022   HDL 47 02/13/2022   LDLCALC 119 (H) 02/13/2022   TRIG 192 (H) 02/13/2022   CHOLHDL 4.3 02/13/2022  Medications Reviewed Today   Medications were not reviewed in this encounter       Assessment/Plan:   Diabetes: - Currently uncontrolled - Reviewed long term cardiovascular and renal outcomes of uncontrolled blood sugar - Reviewed goal A1c, goal fasting, and goal 2 hour post prandial glucose - Recommend to check glucose  2-3 times a day and writing down the timeframe she checks it - Extensive dietary counseling provided today to avoid milk + fruit prior to bedtime and limiting rice + breads; juice in the morning is NOT helping either  *For Farxiga , patient was denied as they recommended her to [facility] Medicaid due to income reported- however when asked patient if she was a [place] resident and had citizenship status, she said no (therefore will NOT qualify for [place] Medicaid)   **Follow Up Plan:**  - Call patient in 1 month after next A1c check- need to figure out how to adjust insulin ; main concern is that she varies her meals and times so unknown how to adjust insulin  properly - Would strongly suggest getting a CGM placed at next office visit (used Dexcom in May)- would be agreeable to using again (cannot afford due to lack of insurance) - Would be willing to trial Trulicity  in the future, but wants to focus on diet over the next month - Called [facility] to have Amlodipine  refilled as well *For future, 70/30 insulin  may be a better option as BG readings could be easier to obtain prior to taking next dose if needed and better adherence  **Of note, do NOT call Mondays or Thursdays as she is at church- available after 2PM; 4PM on Fridays; anytime on Tues and Wednesday    Aloysius Breeding, PharmD [facility] Phone Number: [phone]

## 2023-01-07 ENCOUNTER — Other Ambulatory Visit: Payer: Self-pay

## 2023-01-07 ENCOUNTER — Other Ambulatory Visit (HOSPITAL_COMMUNITY): Payer: Self-pay

## 2023-01-07 ENCOUNTER — Telehealth: Payer: Self-pay | Admitting: *Deleted

## 2023-01-07 NOTE — Progress Notes (Signed)
 Complex Care Management Care Guide Note  01/07/2023 Name: Robin Klein MRN: [id number] DOB: Apr 02, 1970  [name] [name] is a 53 y.o. year old female who is a primary care patient of [name], MD and is actively engaged with the care management team. I reached out to [name] [name] by phone today to assist with re-scheduling  with the RN Case Manager.  Follow up plan: Unsuccessful telephone outreach attempt made. A HIPAA compliant phone message was left for the patient providing contact information and requesting a return call.  [facility]  Care Coordination Care Guide  Direct Dial : [phone]

## 2023-01-07 NOTE — Telephone Encounter (Signed)
 Novo Nordisk requiring proof of income.

## 2023-01-07 NOTE — Telephone Encounter (Signed)
 Left message requesting  call back.

## 2023-01-14 NOTE — Progress Notes (Signed)
 Complex Care Management Care Guide Note  01/14/2023 Name: Robin Klein MRN: [id number] DOB: [birth year]  Robin Klein is a 53 y.o. year old female who is a primary care patient of [name], MD and is actively engaged with the care management team. I reached out to Robin Klein by phone today to assist with re-scheduling  with the RN Case Manager.  Follow up plan: Unsuccessful telephone outreach attempt made. A HIPAA compliant phone message was left for the patient providing contact information and requesting a return call.  [facility]  Care Coordination Care Guide  Direct Dial : [phone]

## 2023-01-15 NOTE — Progress Notes (Signed)
 Complex Care Management Care Guide Note  01/15/2023 Name: [name] MRN: [id number] DOB: [birth year]  [name] Robin Klein is a 53 y.o. year old female who is a primary care patient of [name], MD and is actively engaged with the care management team. I reached out to [name] [name] by phone today to assist with re-scheduling  with the RN Case Manager.  Follow up plan: Unsuccessful telephone outreach attempt made. A HIPAA compliant phone message was left for the patient providing contact information and requesting a return call. No more outreach attempts will be made at this time   [facility] - Inpatient Guide  Direct Dial : [phone]

## 2023-01-18 ENCOUNTER — Other Ambulatory Visit: Payer: Self-pay

## 2023-01-19 ENCOUNTER — Other Ambulatory Visit: Payer: Self-pay

## 2023-01-25 ENCOUNTER — Other Ambulatory Visit: Payer: Self-pay

## 2023-02-01 ENCOUNTER — Encounter: Payer: Self-pay | Admitting: Internal Medicine

## 2023-02-02 ENCOUNTER — Ambulatory Visit: Payer: Self-pay | Admitting: Internal Medicine

## 2023-02-02 ENCOUNTER — Other Ambulatory Visit: Payer: Self-pay

## 2023-02-02 VITALS — BP 123/67 | HR 84 | Temp 97.9°F | Ht 60.0 in | Wt 164.7 lb

## 2023-02-02 DIAGNOSIS — E782 Mixed hyperlipidemia: Secondary | ICD-10-CM

## 2023-02-02 DIAGNOSIS — E119 Type 2 diabetes mellitus without complications: Secondary | ICD-10-CM

## 2023-02-02 DIAGNOSIS — Z794 Long term (current) use of insulin: Secondary | ICD-10-CM

## 2023-02-02 DIAGNOSIS — I10 Essential (primary) hypertension: Secondary | ICD-10-CM

## 2023-02-02 DIAGNOSIS — Z0000 Encounter for general adult medical examination without abnormal findings: Secondary | ICD-10-CM

## 2023-02-02 LAB — POCT GLYCOSYLATED HEMOGLOBIN (HGB A1C): Hemoglobin A1C: 10.5 % — AB (ref 4.0–5.6)

## 2023-02-02 LAB — GLUCOSE, CAPILLARY: Glucose-Capillary: 266 mg/dL — ABNORMAL HIGH (ref 70–99)

## 2023-02-02 MED ORDER — ATORVASTATIN CALCIUM 20 MG PO TABS
20.0000 mg | ORAL_TABLET | Freq: Every day | ORAL | 11 refills | Status: DC
  Filled 2023-02-02: qty 30, 30d supply, fill #0
  Filled 2023-06-07: qty 90, 90d supply, fill #1

## 2023-02-02 MED ORDER — BASAGLAR KWIKPEN 100 UNIT/ML ~~LOC~~ SOPN
48.0000 [IU] | PEN_INJECTOR | Freq: Two times a day (BID) | SUBCUTANEOUS | 3 refills | Status: DC
Start: 2023-02-02 — End: 2023-03-17
  Filled 2023-02-02: qty 15, 16d supply, fill #0
  Filled 2023-03-01: qty 30, 31d supply, fill #0

## 2023-02-02 MED ORDER — AMLODIPINE BESYLATE 10 MG PO TABS
10.0000 mg | ORAL_TABLET | Freq: Every day | ORAL | 1 refills | Status: DC
  Filled 2023-02-02: qty 90, 90d supply, fill #0
  Filled 2023-06-07: qty 90, 90d supply, fill #1

## 2023-02-02 NOTE — Assessment & Plan Note (Signed)
I gave her pamphlet with information for program for cervical cancer and breast cancer screening.

## 2023-02-02 NOTE — Assessment & Plan Note (Signed)
Blood pressure well-controlled at 123/67.  Amlodipine 10 mg refilled.

## 2023-02-02 NOTE — Assessment & Plan Note (Addendum)
She discontinued Victoza in December as she was having significant nausea and diarrhea.  Symptoms improved however continued.  About 2 weeks ago she stopped taking metformin.  Since then she feels like her diarrhea and nausea have resolved.  She is checking her blood sugar twice a day using a glucometer.  She did not bring glucometer at visit.  She does recall that fasting a.m. blood sugars varied between 160s to 180s.  She is taking glargine 44 units twice daily and lispro 20 units 3 times daily.  She was prescribed SGLT2 in November and given free supply from pharmacy this was to buy her time to apply for pharmaceutical assistance.  From last note from pharmacy tech on December 2 she was to send in income prove to pharmaceutical company.  Patient does not recall doing so. A: A1c improved from 12-10 however remains uncontrolled placing her at high risk for complications.  Unfortunately she did not tolerate metformin or GLP-1.  SGLT2 is difficult to obtain due to cost. P: Increase glargine from 44 units twice daily to 48 units twice daily Continue lispro 20 units 3 times daily, I will leave Jardiance on medication list and message [name] about neck step is for her Follow-up in 4 weeks with glucometer and hand to titrate insulin Refilled statin as she had been not taking this

## 2023-02-02 NOTE — Progress Notes (Signed)
Subjective:  CC: diabetes  HPI:  [name] is a 53 y.o. female with a past medical history of diabetes, HLD, HTN, NAFLD who presents today for diabetes.   Please see problem based assessment and plan for additional details.  Past Medical History:  Diagnosis Date   Depression    Diabetes mellitus    Fatty liver disease, nonalcoholic    Hypertension    Sinusitis     MEDICATIONS:  Metformin 1000 mg twice daily Glargine 44 units twice daily Lispro 20 units 3 times daily Empagliflozin?  Atorvastatin 20 mg  Amlodipine 10 mg  Family History  Problem Relation Age of Onset   Diabetes Mother    CAD Mother        Died MI 29   Diabetes Father    CAD Father        Died MI 39   Cancer Paternal Grandmother     Social History   Socioeconomic History   Marital status: Married    Spouse name: Not on file   Number of children: Not on file   Years of education: 7   Highest education level: Not on file  Occupational History    Employer: UNEMPLOYED  Tobacco Use   Smoking status: Never   Smokeless tobacco: Never  Substance and Sexual Activity   Alcohol use: No    Alcohol/week: 0.0 standard drinks of alcohol   Drug use: No   Sexual activity: Not on file  Other Topics Concern   Not on file  Social History Narrative   Financial assistance approved for 100% discount at [facility] and has [facility] card per [name]   12/16/2009   Recently gave birth to a son 04/2009.   Social Drivers of Corporate investment banker Strain: Not on file  Food Insecurity: Food Insecurity Present (11/13/2022)   Hunger Vital Sign    Worried About Running Out of Food in the Last Year: Sometimes true    Ran Out of Food in the Last Year: Never true  Transportation Needs: No Transportation Needs (11/13/2022)   PRAPARE - Administrator, Civil Service (Medical): No    Lack of Transportation (Non-Medical): No  Physical Activity: Not on file  Stress: Not on file  Social  Connections: Not on file  Intimate Partner Violence: Not At Risk (11/13/2022)   Humiliation, Afraid, Rape, and Kick questionnaire    Fear of Current or Ex-Partner: No    Emotionally Abused: No    Physically Abused: No    Sexually Abused: No    Review of Systems: ROS negative except for what is noted on the assessment and plan.  Objective:   Vitals:   02/02/23 1554  BP: 123/67  Pulse: 84  Temp: 97.9 F (36.6 C)  TempSrc: Oral  SpO2: 99%  Weight: 164 lb 11.2 oz (74.7 kg)  Height: 5' (1.524 m)    Physical Exam: Constitutional: well-appearing, in no acute distress Cardiovascular: regular rate and rhythm, no m/r/g Pulmonary/Chest: normal work of breathing on room air, lungs clear to auscultation bilaterally Abdominal: soft, non-tender, non-distended Neurological: alert & oriented x 3, normal gait Skin: warm and dry  Assessment & Plan:  Type 2 diabetes mellitus (HCC) She discontinued Victoza in December as she was having significant nausea and diarrhea.  Symptoms improved however continued.  About 2 weeks ago she stopped taking metformin.  Since then she feels like her diarrhea and nausea have resolved.  She is checking her blood  sugar twice a day using a glucometer.  She did not bring glucometer at visit.  She does recall that fasting a.m. blood sugars varied between 160s to 180s.  She is taking glargine 44 units twice daily and lispro 20 units 3 times daily.  She was prescribed SGLT2 in November and given free supply from pharmacy this was to buy her time to apply for pharmaceutical assistance.  From last note from pharmacy tech on December 2 she was to send in income prove to pharmaceutical company.  Patient does not recall doing so. A: A1c improved from 12-10 however remains uncontrolled placing her at high risk for complications.  Unfortunately she did not tolerate metformin or GLP-1.  SGLT2 is difficult to obtain due to cost. P: Increase glargine from 44 units twice daily to 48  units twice daily Continue lispro 20 units 3 times daily, I will leave Jardiance on medication list and message Robin Klein about neck step is for her Follow-up in 4 weeks with glucometer and hand to titrate insulin Refilled statin as she had been not taking this  Essential hypertension, benign Blood pressure well-controlled at 123/67.  Amlodipine 10 mg refilled.  Health care maintenance I gave her pamphlet with information for program for cervical cancer and breast cancer screening.   Patient discussed with Dr. Hurshel Keys Robin Klein, D.O. [facility]  PGY-3 Pager: [phone]  Phone: [phone] Date 02/02/2023  Time 5:25 PM

## 2023-02-02 NOTE — Patient Instructions (Addendum)
Thank you, Robin Klein for allowing [place] to provide your care today.   Diabetes: Su anlisis de [employer] que la A1c mejor de 12 a 10. El objetivo es mantenerla por debajo de 7 para prevenir complicaciones de la diabetes. He suspendido la metformina de su lista de medicamentos. Le preguntar al tcnico de farmacia sobre la solicitud de asistencia con medicamentos ante la compaa farmacutica para Theme park manager. -aumentar basaglar de 44 unidades a 48 unidades dos veces al da -Warden/ranger 20 unidades con las comidas Seguimiento al mes y Chief Executive Officer.  La remito a los programas de deteccin de cncer de mama y de cuello uterino. Llame al [phone] para programar una cita.  Diabetes: Your blood work showed A1c improved from 12 to 10. The goal is to get this below 7 to prevent complications from diabetes. I have discontinued metformin from your medication list. I will ask the pharmacy tech about the application for medication assistance with the pharmaceutical company for jardiance. -increase basaglar from 44 units to 48 units twice daily -continue humalog 20 units with meals Follow-up in 1 month and bring glucometer in.  I am referring you to the breast and cervical cancer screening programs.please call [phone] to schedule an appointment  I have ordered the following labs for you:  Lab Orders         Glucose, capillary         POC Hbg A1C      I have ordered the following medication/changed the following medications:   Stop the following medications: Medications Discontinued During This Encounter  Medication Reason   metFORMIN (GLUCOPHAGE) 500 MG tablet    dapagliflozin propanediol (FARXIGA) 10 MG TABS tablet Cost of medication   amLODipine (NORVASC) 10 MG tablet Reorder   Insulin Glargine (BASAGLAR KWIKPEN) 100 UNIT/ML    atorvastatin (LIPITOR) 20 MG tablet Reorder     Start the following medications: Meds ordered this encounter  Medications   Insulin  Glargine (BASAGLAR KWIKPEN) 100 UNIT/ML    Sig: Inject 48 Units into the skin 2 (two) times daily.    Dispense:  15 mL    Refill:  3    Fill to [facility]   atorvastatin (LIPITOR) 20 MG tablet    Sig: Take 1 tablet (20 mg total) by mouth daily.    Dispense:  30 tablet    Refill:  11    IM program   amLODipine (NORVASC) 10 MG tablet    Sig: Take 1 tablet (10 mg total) by mouth daily.    Dispense:  90 tablet    Refill:  1    IM program     Follow up:    Remember:   We look forward to seeing you next time. Please call our clinic at [phone] if you have any questions or concerns. The best time to call is Monday-Friday from 9am-4pm, but there is someone available 24/7. If after hours or the weekend, call the main hospital number and ask for the Internal Medicine Resident On-Call. If you need medication refills, please notify your pharmacy one week in advance and they will send [place] a request.   Thank you for trusting me with your care. Wishing you the best!   Rudene Christians, DO [facility]

## 2023-02-03 ENCOUNTER — Other Ambulatory Visit: Payer: Self-pay

## 2023-02-03 NOTE — Progress Notes (Signed)
Internal Medicine Clinic Attending  Case discussed with the resident at the time of the visit.  We reviewed the resident's history and exam and pertinent patient test results.  I agree with the assessment, diagnosis, and plan of care documented in the resident's note.

## 2023-02-10 ENCOUNTER — Other Ambulatory Visit: Payer: Self-pay | Admitting: Pharmacist

## 2023-02-10 NOTE — Progress Notes (Signed)
 02/10/2023 Name: Robin Klein MRN: [id number] DOB: 1970-05-21  Chief Complaint  Patient presents with   Diabetes    Robin Klein is a 53 y.o. year old female who presented for a telephone visit.   They were referred to the pharmacist by their PCP for assistance in managing diabetes and medication access.    Subjective:  Care Team: Primary Care Provider: [name], MD ; Next Scheduled Visit: None Clinical Pharmacist: Aloysius Breeding, PharmD  Medication Access/Adherence  Current Pharmacy:  [facility] [address] [place] [address] Phone: [phone] Fax: [phone]  [facility] - [facility] [address] [place] [address] Phone: [phone] Fax: [phone]  [facility] - [place], [place] - [address] [place] [address] Phone: [phone] Fax: [phone]  [facility] - [place], [place] - [address] [place] [address] [place] [address] Phone: [phone] Fax: [phone]   Patient reports affordability concerns with their medications: Yes  Patient reports access/transportation concerns to their pharmacy: No  Patient reports adherence concerns with their medications:  No     Diabetes:  Current medications: Basaglar  48 U twice a day (recently increased), Humalog  20 U with meals Medications tried in the past: Trulicity  (cost), Victoza  (nausea and vomiting), Jardiance  + Farxiga  (cost- needs proof of income for PAP approval), Metformin  (nausea, vomiting)  - Reports good adherence to medications and insulin  dosing  02/10/23 call: [phone] average  12/31 call: 8-11AM: 290, 264, 307, 398, 312, 313, 286, 406, 360, 265, 333 Around 2-4PM: 326, 161, 141, 347, 321, 317 6-8PM: 298, 306  **Readings are much higher this time- patient reports to eating worse due to the holidays  recently  12/13 call: Fasting: 215, 280, 175, 140, 165, 175 Around lunch: 109 Prior to dinner: 300, 134 Nighttime: 118, 187  Current glucose readings from 12/10: All fasting- 137, 119, 189, 220, 166, 305  Current glucose readings from 11/12 call: 8 AM 309, 11AM 273, 8 PM 231, 7 AM 283  Patient denies hypoglycemic s/sx including dizziness, shakiness, sweating. Patient denies hyperglycemic symptoms including polyuria, polydipsia, polyphagia, nocturia, neuropathy, blurred vision.  Current meals: Breakfast (9AM): Homemade juice drink, 1 egg with tomato + onions, occasional toast Lunch (11AM): Turkey sandwich, coconut milk Dinner (3-4PM ): Chicken breast + vegetables, rice + vegetables Snacks: Bread with milk or fruit, cucumber, tomato Drinks: Water, coconut milk   Current medication access support: No insurance- [facility] card   Objective:  Lab Results  Component Value Date   HGBA1C 10.5 (A) 02/02/2023    Lab Results  Component Value Date   CREATININE 0.57 02/13/2022   BUN 10 02/13/2022   NA 142 02/13/2022   K 4.0 02/13/2022   CL 102 02/13/2022   CO2 21 02/13/2022    Lab Results  Component Value Date   CHOL 200 (H) 02/13/2022   HDL 47 02/13/2022   LDLCALC 119 (H) 02/13/2022   TRIG 192 (H) 02/13/2022   CHOLHDL 4.3 02/13/2022    Medications Reviewed Today   Medications were not reviewed in this encounter       Assessment/Plan:   Diabetes: - Currently uncontrolled - Reviewed long term cardiovascular and renal outcomes of uncontrolled blood sugar - Reviewed goal A1c, goal fasting, and goal 2 hour post prandial glucose - Recommend to check glucose 2-3 times a day and  writing down the timeframe she checks it - Reports stopping sodas and eating less bread; walking more as well since our last visit  *For Farxiga , patient was denied as they recommended her to [facility] Medicaid due to income reported- however when asked patient if she was a [place] resident  and had citizenship status, she said no (therefore will NOT qualify for [place] Medicaid)   **Follow Up Plan:**  - Follow-up call on 03/03/23 for BG check-in for insulin  titration- change Humalog  - Would strongly suggest getting a CGM placed at next office visit (used Dexcom in May)- would be agreeable to using again (cannot afford due to lack of insurance) *For future, 70/30 insulin  may be a better option as BG readings could be easier to obtain prior to taking next dose if needed and better adherence  **Of note, do NOT call Mondays or Thursdays as she is at church- available after 2PM; 4PM on Fridays; anytime on Tues and Wednesday    Aloysius Breeding, PharmD [facility] Phone Number: [phone]

## 2023-02-15 ENCOUNTER — Other Ambulatory Visit: Payer: Self-pay

## 2023-02-22 ENCOUNTER — Other Ambulatory Visit: Payer: Self-pay

## 2023-02-25 ENCOUNTER — Telehealth: Payer: Self-pay

## 2023-02-25 NOTE — Telephone Encounter (Addendum)
Pt no longer taking victoza, medication also no longer available on novo nordisk PAP. If novolog is still needed, income is needed from patient.  Since application older than 90 days, will send out [employer] for basaglar and humalog assistance.

## 2023-03-01 ENCOUNTER — Other Ambulatory Visit: Payer: Self-pay

## 2023-03-02 NOTE — Progress Notes (Signed)
 Pharmacy Medication Assistance Program Note    03/02/2023  Patient ID: Robin Klein, female  DOB: [birth year], 53 y.o.  MRN:  [id number]     02/25/2023  Outreach Medication Four  Manufacturer Medication Four Lilly  [employer] Basaglar  Type of Assistance Manufacturer Assistance  Date Application Sent to Patient 03/02/2023  Application Items Requested Application     03/02/2023  Patient ID: Robin Klein, female  DOB: [birth year], 53 y.o.  MRN:  [id number]     02/25/2023  [facility] AMB MED FIVE OUTREACH  Manufacturer Medication Five Lilly  [employer] Humalog  Type of Assistance Manufacturer Assistance  Date Application Sent to Patient 03/02/2023  Application Items Requested Application     Mailed - new

## 2023-03-03 ENCOUNTER — Other Ambulatory Visit: Payer: Self-pay | Admitting: Pharmacist

## 2023-03-03 ENCOUNTER — Telehealth: Payer: Self-pay | Admitting: Pharmacist

## 2023-03-03 NOTE — Progress Notes (Signed)
   03/03/2023  Patient ID: Robin Klein, female   DOB: 04/18/1970, 53 y.o.   MRN: [id number]  Called patient with assistance of Spanish interpreter today for DM follow-up. Unable to reach at this time. Left voicemail requesting call back at earliest convenience.     Marlowe Aschoff, PharmD [facility] Phone Number: [phone]

## 2023-03-17 ENCOUNTER — Ambulatory Visit (INDEPENDENT_AMBULATORY_CARE_PROVIDER_SITE_OTHER): Payer: Self-pay | Admitting: Student

## 2023-03-17 ENCOUNTER — Other Ambulatory Visit: Payer: Self-pay

## 2023-03-17 VITALS — BP 126/75 | HR 91 | Wt 160.6 lb

## 2023-03-17 DIAGNOSIS — Z794 Long term (current) use of insulin: Secondary | ICD-10-CM

## 2023-03-17 DIAGNOSIS — E119 Type 2 diabetes mellitus without complications: Secondary | ICD-10-CM

## 2023-03-17 DIAGNOSIS — K760 Fatty (change of) liver, not elsewhere classified: Secondary | ICD-10-CM

## 2023-03-17 DIAGNOSIS — R7989 Other specified abnormal findings of blood chemistry: Secondary | ICD-10-CM

## 2023-03-17 DIAGNOSIS — E782 Mixed hyperlipidemia: Secondary | ICD-10-CM

## 2023-03-17 DIAGNOSIS — E1165 Type 2 diabetes mellitus with hyperglycemia: Secondary | ICD-10-CM

## 2023-03-17 MED ORDER — BASAGLAR KWIKPEN 100 UNIT/ML ~~LOC~~ SOPN
54.0000 [IU] | PEN_INJECTOR | Freq: Two times a day (BID) | SUBCUTANEOUS | 3 refills | Status: DC
Start: 2023-03-17 — End: 2023-05-13
  Filled 2023-03-17: qty 15, 14d supply, fill #0
  Filled 2023-05-05: qty 30, 28d supply, fill #0

## 2023-03-17 MED ORDER — PEN NEEDLES 31G X 5 MM MISC
1.0000 | Freq: Three times a day (TID) | 0 refills | Status: DC
  Filled 2023-06-07: qty 100, 25d supply, fill #0

## 2023-03-17 MED ORDER — CONTOUR NEXT TEST VI STRP
ORAL_STRIP | 12 refills | Status: DC
  Filled 2023-03-17: qty 100, 25d supply, fill #0
  Filled 2023-05-11: qty 100, 25d supply, fill #1
  Filled 2023-08-13: qty 100, 25d supply, fill #2

## 2023-03-17 NOTE — Telephone Encounter (Signed)
 Left 2nd message requesting call back. Income needed.

## 2023-03-17 NOTE — Progress Notes (Addendum)
 CC:  Chief Complaint  Patient presents with   Diabetes    Follow up    HPI:  [name] is a 53 y.o. female living with a history stated below and presents today for the above. Please see problem based assessment and plan for additional details.  Past Medical History:  Diagnosis Date   Depression    Diabetes mellitus    Fatty liver disease, nonalcoholic    Hypertension    Sinusitis     Current Outpatient Medications on File Prior to Visit  Medication Sig Dispense Refill   amLODipine (NORVASC) 10 MG tablet Take 1 tablet (10 mg total) by mouth daily. 90 tablet 1   atorvastatin (LIPITOR) 20 MG tablet Take 1 tablet (20 mg total) by mouth daily. 30 tablet 11   insulin lispro (HUMALOG) 100 UNIT/ML injection Inject 0.2 mLs (20 Units total) into the skin 3 (three) times daily before meals. 10 mL 11   Insulin Syringe-Needle U-100 (GNP INSULIN SYRINGE) 31G X 5/16" 0.5 ML MISC Use to inject insulin daily as instructed 100 each 3   Lancets MISC Use to check blood sugar as directed 100 each 3   No current facility-administered medications on file prior to visit.    Family History  Problem Relation Age of Onset   Diabetes Mother    CAD Mother        Died MI 34   Diabetes Father    CAD Father        Died MI 60   Cancer Paternal Grandmother     Social History   Socioeconomic History   Marital status: Married    Spouse name: Not on file   Number of children: Not on file   Years of education: 7   Highest education level: Not on file  Occupational History    Employer: UNEMPLOYED  Tobacco Use   Smoking status: Never   Smokeless tobacco: Never  Substance and Sexual Activity   Alcohol use: No    Alcohol/week: 0.0 standard drinks of alcohol   Drug use: No   Sexual activity: Not on file  Other Topics Concern   Not on file  Social History Narrative   Financial assistance approved for 100% discount at [facility] and has [facility] card per [name]   12/16/2009    Recently gave birth to a son 04/2009.   Social Drivers of Corporate investment banker Strain: Not on file  Food Insecurity: Food Insecurity Present (11/13/2022)   Hunger Vital Sign    Worried About Running Out of Food in the Last Year: Sometimes true    Ran Out of Food in the Last Year: Never true  Transportation Needs: No Transportation Needs (11/13/2022)   PRAPARE - Administrator, Civil Service (Medical): No    Lack of Transportation (Non-Medical): No  Physical Activity: Not on file  Stress: Not on file  Social Connections: Not on file  Intimate Partner Violence: Not At Risk (11/13/2022)   Humiliation, Afraid, Rape, and Kick questionnaire    Fear of Current or Ex-Partner: No    Emotionally Abused: No    Physically Abused: No    Sexually Abused: No    Review of Systems: ROS negative except for what is noted on the assessment and plan.  Vitals:   03/17/23 1326  BP: 126/75  Pulse: 91  SpO2: 97%  Weight: 160 lb 9.6 oz (72.8 kg)    Physical Exam: Constitutional: well-appearing, in no acute  distress HENT: normocephalic atraumatic, mucous membranes moist Eyes: conjunctiva non-erythematous Cardiovascular: regular rate and rhythm, no m/r/g Pulmonary/Chest: normal work of breathing on room air, lungs clear to auscultation bilaterally Abdominal: soft, non-tender, non-distended MSK: normal bulk and tone Neurological: alert & oriented x 3, no focal deficit Skin: warm and dry Psych: normal mood and behavior  Assessment & Plan:   Patient discussed with Dr. Mayford Knife  Type 2 diabetes mellitus (HCC) Pt has a history of T2DM hgb A1c in January was 10. She was previously on Victoza and metformin but has had significant nausea with them. She cannot afford SGLT-2, currently has the orange card. Is on 50 units of basal insulin BID and 20 units of short acting TID with meals. She reports good adherence with her basal. She will sometimes miss the lunch time insulin because of  work. Despite this, she is still having high's in the evening in the mid to high 200s and sometimes goes over 300 during the day. No episodes of hypoglycemia. States that she used to have trouble affording meds, primarily the SGLT-2, but is able to obtain her insulin through [facility] now. She also states that she is trying to eat healthier, but at night time will often reach out to eat bread as a snack. She is also on atorvastatin 20mg  daily. Reports taking this medication with no side effects. Will measure her lipids today as she has not gotten this done in over a year. Has recently seen ophthalmology in September last year and not due for an eye exam. Cr function has not been checked this year. BP well managed with amlodipine 10mg  daily. Unfortunately she has allergies to Ace inhibitors.   -Avoid eating simple flours and processed sugars. Commended patient as she stopped drinking soda.  -Aim to exercise about 150 min a week  -Increase basaglar to 54 units BID  -cw Humalog 20 units TID  -continue monitoring blood glucose in the AM and before meals. -Urine Cr/microalbumin ratio today  -BMP  -Lipid panel  NAFLD (nonalcoholic fatty liver disease) Will add-on LFTs. CT scan from 2022 did not show hepatosteatosis.   Addendum: Last [place] was on 2017 showed hepatosteatosis. CT scan on 2022 did not show this. LFTs are elevated and stable from 5 years ago. Will monitor with RUQ [place].  Manuela Neptune, MD [facility] Internal Medicine, PGY-1 Phone: [phone] Date 03/23/2023 Time 10:36 PM

## 2023-03-17 NOTE — Patient Instructions (Signed)
 Robin Klein, Robin Klein por permitirnos brindarle atencin hoy.   He ordenado los siguientes laboratorios para usted:   Lab Orders         BMP8+Anion Gap         Microalbumin / Creatinine Urine Ratio         Lipid Profile      Pruebas ordenadas hoy:  Referencias ordenadas hoy:   Referral Orders         Referral to Nutrition and Diabetes Services      He ordenado el siguiente medicamento/cambiado los siguientes medicamentos:  Iniciar los siguientes medicamentos: Meds ordered this encounter  Medications   Insulin Glargine (BASAGLAR KWIKPEN) 100 UNIT/ML    Sig: Inject 54 Units into the skin 2 (two) times daily.    Dispense:  15 mL    Refill:  3    Fill to [facility]   Insulin Pen Needle (PEN NEEDLES) 31G X 5 MM MISC    Sig: Inject 1 each into the skin 4 (four) times daily -  before meals and at bedtime.    IM Program   glucose blood (CONTOUR NEXT TEST) test strip    Sig: CHECK  BLOOD SUGARS FOUR TIMES DAILY - FIRST THING IN THE MORNING AND BEFORE MEALS.    Dispense:  100 strip    Refill:  12    IM Program     Seguimiento  1 mes     Recuerde:   Incrementar 50 unidades de Engineer, technical sales y en la noche  Siga con las 20 unidades del Humalog con las comidas  Haga mas ejercicio, a la semana  Coma cosas sanas, integrales, vegetales, frutas con bajo azucar    Si tiene alguna pregunta o inquietud, llame a la clnica de medicina interna al [phone].     Manuela Neptune, MD [facility]

## 2023-03-17 NOTE — Assessment & Plan Note (Signed)
 Pt has a history of T2DM hgb A1c in January was 10. She was previously on Victoza and metformin but has had significant nausea with them. She cannot afford SGLT-2, currently has the orange card. Is on 50 units of basal insulin BID and 20 units of short acting TID with meals. She reports good adherence with her basal. She will sometimes miss the lunch time insulin because of work. Despite this, she is still having high's in the evening in the mid to high 200s and sometimes goes over 300 during the day. No episodes of hypoglycemia. States that she used to have trouble affording meds, primarily the SGLT-2, but is able to obtain her insulin through [facility] now. She also states that she is trying to eat healthier, but at night time will often reach out to eat bread as a snack. She is also on atorvastatin 20mg  daily. Reports taking this medication with no side effects. Will measure her lipids today as she has not gotten this done in over a year. Has recently seen ophthalmology in September last year and not due for an eye exam. Cr function has not been checked this year. BP well managed with amlodipine 10mg  daily. Unfortunately she has allergies to Ace inhibitors.   -Avoid eating simple flours and processed sugars. Commended patient as she stopped drinking soda.  -Aim to exercise about 150 min a week  -Increase basaglar to 54 units BID  -cw Humalog 20 units TID  -continue monitoring blood glucose in the AM and before meals. -Urine Cr/microalbumin ratio today  -BMP  -Lipid panel

## 2023-03-17 NOTE — Assessment & Plan Note (Addendum)
 Will add-on LFTs. CT scan from 2022 did not show hepatosteatosis.   Addendum: Last [place] was on 2017 showed hepatosteatosis. CT scan on 2022 did not show this. LFTs are elevated and stable from 5 years ago. Will monitor with RUQ [place].

## 2023-03-18 LAB — MICROALBUMIN / CREATININE URINE RATIO
Creatinine, Urine: 350.8 mg/dL
Microalb/Creat Ratio: 19 mg/g{creat} (ref 0–29)
Microalbumin, Urine: 67.9 ug/mL

## 2023-03-19 LAB — BMP8+ANION GAP
Anion Gap: 17 mmol/L (ref 10.0–18.0)
BUN/Creatinine Ratio: 15 (ref 9–23)
BUN: 9 mg/dL (ref 6–24)
CO2: 21 mmol/L (ref 20–29)
Calcium: 9.4 mg/dL (ref 8.7–10.2)
Chloride: 105 mmol/L (ref 96–106)
Creatinine, Ser: 0.6 mg/dL (ref 0.57–1.00)
Glucose: 77 mg/dL (ref 70–99)
Potassium: 3.5 mmol/L (ref 3.5–5.2)
Sodium: 143 mmol/L (ref 134–144)
eGFR: 109 mL/min/{1.73_m2} (ref >59)

## 2023-03-19 LAB — HEPATIC FUNCTION PANEL
ALT: 81 IU/L — ABNORMAL HIGH (ref 0–32)
AST: 59 IU/L — ABNORMAL HIGH (ref 0–40)
Albumin: 4.4 g/dL (ref 3.9–4.9)
Alkaline Phosphatase: 145 IU/L — ABNORMAL HIGH (ref 44–121)
Bilirubin Total: 0.3 mg/dL (ref 0.0–1.2)
Bilirubin, Direct: 0.13 mg/dL (ref 0.00–0.40)
Total Protein: 7.8 g/dL (ref 6.0–8.5)

## 2023-03-19 LAB — LIPID PANEL
Chol/HDL Ratio: 3.9 ratio (ref 0.0–4.4)
Cholesterol, Total: 173 mg/dL (ref 100–199)
HDL: 44 mg/dL (ref >39)
LDL Chol Calc (NIH): 110 mg/dL — ABNORMAL HIGH (ref 0–99)
Triglycerides: 101 mg/dL (ref 0–149)
VLDL Cholesterol Cal: 19 mg/dL (ref 5–40)

## 2023-03-23 NOTE — Addendum Note (Signed)
 Addended by: Manuela Neptune on: 03/23/2023 10:36 PM   Modules accepted: Orders

## 2023-03-24 ENCOUNTER — Other Ambulatory Visit: Payer: Self-pay

## 2023-03-24 NOTE — Progress Notes (Signed)
 Internal Medicine Clinic Attending  Case discussed with the resident at the time of the visit.  We reviewed the resident's history and exam and pertinent patient test results.  I agree with the assessment, diagnosis, and plan of care documented in the resident's note.

## 2023-03-25 NOTE — Progress Notes (Signed)
 Pharmacy Medication Assistance Program Note    03/25/2023  Patient ID: Robin Klein, female  DOB: 03/13/1970, 53 y.o.  MRN:  [id number]     12/07/2022 12/09/2022  Outreach Medication Three  Manufacturer Medication Three  [employer]  [employer] Drugs  Jardiance  Dose of Jardiance  10MG   Type of Forensic scientist Assistance  Date Application Sent to Patient 10/28/2022   Application Items Requested Application   Date Application Sent to Prescriber  12/09/2022  Name of Prescriber  Murle Hellstrom  Date Application Received From Patient 12/02/2022   Date Application Received From Provider  12/16/2022  Date Application Submitted to Manufacturer  12/22/2022  Method Application Sent to Manufacturer  Fax  Patient Assistance Determination  Denied  Patient Denial Reasons  Other    Application closed with company due to missing info (income) being submitted in a timely manner.

## 2023-04-05 ENCOUNTER — Telehealth: Payer: Self-pay

## 2023-04-05 NOTE — Telephone Encounter (Signed)
 Patient returned call.   Says she received the [employer] application and will return it (for her insulins basaglar and humalog).   Also informed her that the [facility] application was denied due to no proof of income being submitted. Patient would like the application resent to her and understands she needs to return with proof of income.

## 2023-04-07 NOTE — Telephone Encounter (Signed)
 Mailed [employer] CARES application to patients home - 2nd attempt.   Will need proof of income.

## 2023-04-15 ENCOUNTER — Ambulatory Visit: Payer: Self-pay | Admitting: Student

## 2023-04-15 ENCOUNTER — Encounter: Payer: Self-pay | Admitting: Dietician

## 2023-04-15 ENCOUNTER — Encounter: Payer: Self-pay | Admitting: Student

## 2023-04-15 VITALS — BP 128/69 | HR 65 | Temp 97.9°F | Ht 60.0 in | Wt 149.9 lb

## 2023-04-15 DIAGNOSIS — Z0000 Encounter for general adult medical examination without abnormal findings: Secondary | ICD-10-CM

## 2023-04-15 DIAGNOSIS — Z794 Long term (current) use of insulin: Secondary | ICD-10-CM

## 2023-04-15 DIAGNOSIS — Z1231 Encounter for screening mammogram for malignant neoplasm of breast: Secondary | ICD-10-CM

## 2023-04-15 DIAGNOSIS — I10 Essential (primary) hypertension: Secondary | ICD-10-CM

## 2023-04-15 DIAGNOSIS — E119 Type 2 diabetes mellitus without complications: Secondary | ICD-10-CM

## 2023-04-15 NOTE — Assessment & Plan Note (Addendum)
 She is currently taking humalog 100 unit/ml injection 20 units TID and basaglar 100 unit/mL 54 units into the skin BID and is tolerating these medications well. She takes her glucose consistently and says that it runs about 90 to 120-130 in the mornings and will rise to 250 after meals. Overall, this is improved from her prior blood sugar readings. She has also implemented dietary and lifestyle modifications, such as avoiding sugary beverages, and has lost about 11 lbs. She denies polyuria, polydipsia, polyphagia, hypoglycemic sxs such as dizziness, sweating, or tremors, and numbness/tingling of the extremities. On physical exam today, diabetic food exam with monofilament revealed normal sensation.   Plan: she may continue with her current medication regimen and follow up in 1 month where an A1C will be taken to see if insulin decrease is necessary as she has not tolerated other medication options for her DM2.

## 2023-04-15 NOTE — Progress Notes (Signed)
 This is a Psychologist, occupational Note.  The care of the patient was discussed with Dr.Gomez-Caraballo and the assessment and plan was formulated with their assistance.  Please see their note for official documentation of the patient encounter.   Subjective:   Patient ID: Robin Klein female   DOB: [birth year] 53 y.o.   MRN: [id number]  HPI: Robin Klein is a 53 y.o. woman with a PMH of diabetes, HTN, and HLD who presents today for a follow up on her diabetes and for a referral for a mammogram.   For the details of today's visit, please refer to the assessment and plan.     Past Medical History:  Diagnosis Date   Depression    Diabetes mellitus    Fatty liver disease, nonalcoholic    Hypertension    Sinusitis    Current Outpatient Medications  Medication Sig Dispense Refill   amLODipine (NORVASC) 10 MG tablet Take 1 tablet (10 mg total) by mouth daily. 90 tablet 1   atorvastatin (LIPITOR) 20 MG tablet Take 1 tablet (20 mg total) by mouth daily. 30 tablet 11   glucose blood (CONTOUR NEXT TEST) test strip CHECK  BLOOD SUGARS FOUR TIMES DAILY - FIRST THING IN THE MORNING AND BEFORE MEALS. 100 strip 12   Insulin Glargine (BASAGLAR KWIKPEN) 100 UNIT/ML Inject 54 Units into the skin 2 (two) times daily. 15 mL 3   insulin lispro (HUMALOG) 100 UNIT/ML injection Inject 0.2 mLs (20 Units total) into the skin 3 (three) times daily before meals. 10 mL 11   Insulin Pen Needle (PEN NEEDLES) 31G X 5 MM MISC Inject 1 each into the skin 4 (four) times daily -  before meals and at bedtime.     Insulin Syringe-Needle U-100 (GNP INSULIN SYRINGE) 31G X 5/16" 0.5 ML MISC Use to inject insulin daily as instructed 100 each 3   Lancets MISC Use to check blood sugar as directed 100 each 3   No current facility-administered medications for this visit.   Family History  Problem Relation Age of Onset   Diabetes Mother    CAD Mother        Died MI 75   Diabetes Father    CAD Father         Died MI 37   Cancer Paternal Grandmother    Social History   Socioeconomic History   Marital status: Married    Spouse name: Not on file   Number of children: Not on file   Years of education: 7   Highest education level: Not on file  Occupational History    Employer: UNEMPLOYED  Tobacco Use   Smoking status: Never   Smokeless tobacco: Never  Substance and Sexual Activity   Alcohol use: No    Alcohol/week: 0.0 standard drinks of alcohol   Drug use: No   Sexual activity: Not on file  Other Topics Concern   Not on file  Social History Narrative   Financial assistance approved for 100% discount at [facility] and has [facility] card per [name]   12/16/2009   Recently gave birth to a son 04/2009.   Social Drivers of Corporate investment banker Strain: Not on file  Food Insecurity: Food Insecurity Present (11/13/2022)   Hunger Vital Sign    Worried About Running Out of Food in the Last Year: Sometimes true    Ran Out of Food in the Last Year: Never true  Transportation Needs: No Transportation Needs (11/13/2022)  PRAPARE - Administrator, Civil Service (Medical): No    Lack of Transportation (Non-Medical): No  Physical Activity: Not on file  Stress: Not on file  Social Connections: Not on file   Review of Systems: Pertinent items are noted in HPI. Objective:  Physical Exam: Vitals:   04/15/23 1355  BP: 128/69  Pulse: 65  Temp: 97.9 F (36.6 C)  TempSrc: Oral  SpO2: 100%  Weight: 149 lb 14.4 oz (68 kg)  Height: 5' (1.524 m)    Cardiovascular: RRR Pulmonary/Chest: CTAB Feet: No visible ulcers, wounds, discoloration. Normal sensation in bilateral feet detected with monofilament test   Assessment & Plan:   Essential hypertension, benign In regard to her blood pressure, she is taking amlodipine consistently and tolerating this medication well; her bp is well controlled today 128/69.   Continue to take amlodipine and follow up in 1 month.   Type 2  diabetes mellitus (HCC) She is currently taking humalog 100 unit/ml injection 20 units TID and basaglar 100 unit/mL 54 units into the skin BID and is tolerating these medications well. She takes her glucose consistently and says that it runs about 90 to 120-130 in the mornings and will rise to 250 after meals. Overall, this is improved from her prior blood sugar readings. She has also implemented dietary and lifestyle modifications, such as avoiding sugary beverages, and has lost about 11 lbs. She denies polyuria, polydipsia, polyphagia, hypoglycemic sxs such as dizziness, sweating, or tremors, and numbness/tingling of the extremities. On physical exam today, diabetic food exam with monofilament revealed normal sensation.   Plan: she may continue with her current medication regimen and follow up in 1 month where an A1C will be taken to see if insulin decrease is necessary as she has not tolerated other medication options for her DM2.  Health care maintenance She is interested in getting a mammogram referral through [facility] since she does not have health insurance; she has not noticed any concerning breast changes and wants to do the mammogram for screening purposes.   Plan: Referral through [facility] for mammogram

## 2023-04-15 NOTE — Patient Instructions (Addendum)
 [name] -  Diabetes - Felicitaciones por su cuidado y por los cambios que ha hecho para mejorar su salud. - Continuue su regimen de diabetes como lo lleva -Basaglar 54 units dos veces al dia - Humalog 20 units tres veces al dia - su examen de pie es normal - Continue la atorvastatina y la amlodipina   Mamografia - ya envie la orden para que se haga el examen de mama  Por favor, haga una cita en un mes para chequear sus niveles de Chief Financial Officer y hacer cambios a sus medicamentos.  Cuidese mucho,  [name], MD

## 2023-04-15 NOTE — Assessment & Plan Note (Signed)
 In regard to her blood pressure, she is taking amlodipine consistently and tolerating this medication well; her bp is well controlled today 128/69.   Continue to take amlodipine and follow up in 1 month.

## 2023-04-15 NOTE — Assessment & Plan Note (Signed)
 She is interested in getting a mammogram referral through [facility] since she does not have health insurance; she has not noticed any concerning breast changes and wants to do the mammogram for screening purposes.   Plan: Referral through [facility] for mammogram

## 2023-04-18 NOTE — Progress Notes (Signed)
 Internal Medicine Clinic Attending  Case discussed with the resident at the time of the visit.  We reviewed the resident's history and exam and pertinent patient test results.  I agree with the assessment, diagnosis, and plan of care documented in the resident's note.

## 2023-04-21 ENCOUNTER — Other Ambulatory Visit (HOSPITAL_COMMUNITY): Payer: Self-pay

## 2023-04-21 ENCOUNTER — Other Ambulatory Visit: Payer: Self-pay

## 2023-04-21 NOTE — Telephone Encounter (Signed)
 Patient receiving DOH at [facility] pharmacy.

## 2023-04-21 NOTE — Telephone Encounter (Signed)
 Insulins being picked up at [facility] pharmacy under [facility].

## 2023-05-05 ENCOUNTER — Other Ambulatory Visit: Payer: Self-pay

## 2023-05-11 ENCOUNTER — Other Ambulatory Visit: Payer: Self-pay

## 2023-05-13 ENCOUNTER — Other Ambulatory Visit: Payer: Self-pay

## 2023-05-13 ENCOUNTER — Ambulatory Visit: Payer: Self-pay | Admitting: Student

## 2023-05-13 ENCOUNTER — Encounter: Payer: Self-pay | Admitting: Student

## 2023-05-13 VITALS — BP 119/76 | HR 91 | Temp 98.2°F | Ht 60.0 in | Wt 152.5 lb

## 2023-05-13 DIAGNOSIS — E119 Type 2 diabetes mellitus without complications: Secondary | ICD-10-CM

## 2023-05-13 DIAGNOSIS — I10 Essential (primary) hypertension: Secondary | ICD-10-CM

## 2023-05-13 DIAGNOSIS — J069 Acute upper respiratory infection, unspecified: Secondary | ICD-10-CM

## 2023-05-13 DIAGNOSIS — Z794 Long term (current) use of insulin: Secondary | ICD-10-CM

## 2023-05-13 LAB — POCT GLYCOSYLATED HEMOGLOBIN (HGB A1C): Hemoglobin A1C: 9.7 % — AB (ref 4.0–5.6)

## 2023-05-13 LAB — GLUCOSE, CAPILLARY: Glucose-Capillary: 128 mg/dL — ABNORMAL HIGH (ref 70–99)

## 2023-05-13 MED ORDER — BASAGLAR KWIKPEN 100 UNIT/ML ~~LOC~~ SOPN
56.0000 [IU] | PEN_INJECTOR | Freq: Two times a day (BID) | SUBCUTANEOUS | 3 refills | Status: DC
  Filled 2023-05-13: qty 15, 13d supply, fill #0
  Filled 2023-06-07: qty 30, 27d supply, fill #0
  Filled 2023-07-19: qty 30, 27d supply, fill #1

## 2023-05-13 MED ORDER — METFORMIN HCL ER 500 MG PO TB24
500.0000 mg | ORAL_TABLET | Freq: Every day | ORAL | 3 refills | Status: AC
Start: 2023-05-13 — End: ?
  Filled 2023-05-13: qty 30, 30d supply, fill #0

## 2023-05-13 NOTE — Assessment & Plan Note (Signed)
 Patient has a history of type 2 diabetes.  Hemoglobin A1c decreased 1 point since last visit.  It is 9.7 this time.  She is using 54 units of Basaglar  twice daily and 20 units of Humalog  3 times daily.  She states that she has been using Humalog  20 units twice daily more than 3 times daily/sometimes skip a meal this has been more active this week since she has been feeling ill with congestion cough and signs of an upper respiratory infection.  Her blood glucose has also gone up.  These have ranged between 300 and 400 in the morning fasting.  She also is not in this range at night.  She states that she has been drinking a lot of sugary drinks to keep herself hydrated, she is also eating grapes and other citrusy foods, and she is eating at least 6 tortillas daily and white rice.  I have counseled her on stopping the tortilla slowly she can go down from 6-4 tortillas a day and having rice once a day.  I have also given her alternatives for the rice such as using quinoa, or using cauliflower rice.  She is exercising daily which I encouraged her to continue doing this however she would benefit from starting metformin .  In the past she was on regular metformin  and started taking at 1000 mg twice daily.  She had to stop this due to nausea vomiting and GI side effects.  She is amenable to starting metformin  extended release at 500 mg with breakfast daily.  She is to try taking the metformin  and increasing the Basaglar  to 56 units twice daily, she will monitor her glucose and write it down and bring in a log.   -Increase Basaglar  to 56 units twice daily -Continue with Humalog  20 units 3 times daily -Lifestyle modification as above -Start taking lipitor 20mg  daily (was not taking)  -Follow-up in 1 month

## 2023-05-13 NOTE — Assessment & Plan Note (Signed)
 Has not been taking amlodipine . BP WNL. Will ctm

## 2023-05-13 NOTE — Assessment & Plan Note (Signed)
 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  She has been having upper respiratory infection symptoms of congestion, myalgias, fevers, chills that started about 6 days ago.  She did have children sick around her.  They had tested negative for COVID.  She is saturating well on room air and she is afebrile today.  Counseled her on supportive care such as Tylenol  up to 1000 mg every 6 hours as well as ibuprofen  400 mg every 6 hours.  She can use humidifier, nasal saline sprays, and drink water instead of sugary drinks. She is to continue with this supportive care, if she does not improve in the next week or so she will be calling the office to follow up on this.

## 2023-05-13 NOTE — Patient Instructions (Signed)
 Thank you, [name] for allowing us  to provide your care today.   I have ordered the following labs for you:   Lab Orders         Glucose, capillary         POC Hbg A1C      I have ordered the following medication/changed the following medications:   Stop the following medications: Medications Discontinued During This Encounter  Medication Reason   Insulin  Glargine (BASAGLAR  KWIKPEN) 100 UNIT/ML      Start the following medications: Meds ordered this encounter  Medications   metFORMIN  (GLUCOPHAGE -XR) 500 MG 24 hr tablet    Sig: Take 1 tablet (500 mg total) by mouth daily with breakfast.    Dispense:  30 tablet    Refill:  3    IMTP   Insulin  Glargine (BASAGLAR  KWIKPEN) 100 UNIT/ML    Sig: Inject 56 Units into the skin 2 (two) times daily.    Dispense:  15 mL    Refill:  3    IMTP     Follow up: 1 mes    Remember:    Empiece a tomar metfomina 500mg  en el desayuno   Suba su basaglar  a 56 unidades dos veces al dia  Midase la glucosa en la AM, tarde, y noche antes de comer  [employer] (de 6 a 4), el arroz limit a una vez al dia, y deje de tomar el jugo de fruta y las uvas   Si come pasta, coma pasta alta en proteina. Si come arroz subs tituya por quinoa o coliflor.   Tome la statina.  Should you have any questions or concerns please call the internal medicine clinic at [phone].     Jose Ngo, MD [facility]

## 2023-05-13 NOTE — Progress Notes (Signed)
 CC:  Chief Complaint  Patient presents with   Diabetes   Follow-up   Sore Throat     With  fever  and  chills  for the past  6 days ( NO FEVER TODAY )     HPI:  [name] is a 53 y.o. female living with a history stated below and presents today for the above. Please see problem based assessment and plan for additional details.  Past Medical History:  Diagnosis Date   Depression    Diabetes mellitus    Fatty liver disease, nonalcoholic    Hypertension    Sinusitis     Current Outpatient Medications on File Prior to Visit  Medication Sig Dispense Refill   amLODipine  (NORVASC ) 10 MG tablet Take 1 tablet (10 mg total) by mouth daily. 90 tablet 1   atorvastatin  (LIPITOR) 20 MG tablet Take 1 tablet (20 mg total) by mouth daily. 30 tablet 11   glucose blood (CONTOUR NEXT TEST) test strip CHECK  BLOOD SUGARS FOUR TIMES DAILY - FIRST THING IN THE MORNING AND BEFORE MEALS. 100 strip 12   insulin  lispro (HUMALOG ) 100 UNIT/ML injection Inject 0.2 mLs (20 Units total) into the skin 3 (three) times daily before meals. 10 mL 11   Insulin  Pen Needle (PEN NEEDLES) 31G X 5 MM MISC Inject 1 each into the skin 4 (four) times daily -  before meals and at bedtime.     Insulin  Syringe-Needle U-100 (GNP INSULIN  SYRINGE) 31G X 5/16" 0.5 ML MISC Use to inject insulin  daily as instructed 100 each 3   Lancets MISC Use to check blood sugar as directed 100 each 3   No current facility-administered medications on file prior to visit.    Family History  Problem Relation Age of Onset   Diabetes Mother    CAD Mother        Died MI 40   Diabetes Father    CAD Father        Died MI 82   Cancer Paternal Grandmother     Social History   Socioeconomic History   Marital status: Married    Spouse name: Not on file   Number of children: Not on file   Years of education: 7   Highest education level: Not on file  Occupational History    Employer: UNEMPLOYED  Tobacco Use   Smoking  status: Never   Smokeless tobacco: Never  Substance and Sexual Activity   Alcohol use: No    Alcohol/week: 0.0 standard drinks of alcohol   Drug use: No   Sexual activity: Not on file  Other Topics Concern   Not on file  Social History Narrative   Financial assistance approved for 100% discount at [facility] and has [facility] card per Richmond Chapman   12/16/2009   Recently gave birth to a son 04/2009.   Social Drivers of Corporate investment banker Strain: Not on file  Food Insecurity: Food Insecurity Present (11/13/2022)   Hunger Vital Sign    Worried About Running Out of Food in the Last Year: Sometimes true    Ran Out of Food in the Last Year: Never true  Transportation Needs: No Transportation Needs (11/13/2022)   PRAPARE - Administrator, Civil Service (Medical): No    Lack of Transportation (Non-Medical): No  Physical Activity: Not on file  Stress: Not on file  Social Connections: Not on file  Intimate Partner Violence: Not At Risk (11/13/2022)  Humiliation, Afraid, Rape, and Kick questionnaire    Fear of Current or Ex-Partner: No    Emotionally Abused: No    Physically Abused: No    Sexually Abused: No    Review of Systems: ROS negative except for what is noted on the assessment and plan.  Vitals:   05/13/23 1344  BP: 119/76  Pulse: 91  Temp: 98.2 F (36.8 C)  TempSrc: Oral  SpO2: 100%  Weight: 152 lb 8 oz (69.2 kg)  Height: 5' (1.524 m)    Physical Exam: Constitutional: well-appearing, in NAD HENT: normocephalic atraumatic, mucous membranes moist, tympanic membranes non-bulging with normal light reflex. No LAD, hypertrophic adenoid on the right, no purulence bilaterally Eyes: conjunctiva non-erythematous Cardiovascular: regular rate and rhythm, no m/r/g, no BLE  Pulmonary/Chest: normal work of breathing on room air, lungs clear to auscultation bilaterally Abdominal: soft, non-tender, non-distended MSK: normal bulk and tone Neurological: alert & oriented x  3, no focal deficit Skin: warm and dry Psych: normal mood and behavior  Assessment & Plan:   Patient discussed with Dr. Broadus Canes  Essential hypertension, benign Has not been taking amlodipine . BP WNL. Will ctm  Type 2 diabetes mellitus (HCC) Patient has a history of type 2 diabetes.  Hemoglobin A1c decreased 1 point since last visit.  It is 9.7 this time.  She is using 54 units of Basaglar  twice daily and 20 units of Humalog  3 times daily.  She states that she has been using Humalog  20 units twice daily more than 3 times daily/sometimes skip a meal this has been more active this week since she has been feeling ill with congestion cough and signs of an upper respiratory infection.  Her blood glucose has also gone up.  These have ranged between 300 and 400 in the morning fasting.  She also is not in this range at night.  She states that she has been drinking a lot of sugary drinks to keep herself hydrated, she is also eating grapes and other citrusy foods, and she is eating at least 6 tortillas daily and white rice.  I have counseled her on stopping the tortilla slowly she can go down from 6-4 tortillas a day and having rice once a day.  I have also given her alternatives for the rice such as using quinoa, or using cauliflower rice.  She is exercising daily which I encouraged her to continue doing this however she would benefit from starting metformin .  In the past she was on regular metformin  and started taking at 1000 mg twice daily.  She had to stop this due to nausea vomiting and GI side effects.  She is amenable to starting metformin  extended release at 500 mg with breakfast daily.  She is to try taking the metformin  and increasing the Basaglar  to 56 units twice daily, she will monitor her glucose and write it down and bring in a log.   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  She has been having upper respiratory infection symptoms of congestion, myalgias, fevers, chills that started about 6 days ago.  She did have children sick around her.  They had tested negative for COVID.  She is saturating well on room air and she is afebrile today.  Counseled her on supportive care such as  Tylenol  up to 1000 mg every 6 hours as well as ibuprofen  400 mg every 6 hours.  She can use humidifier, nasal saline sprays, and drink water instead of sugary drinks. She is to continue with this supportive care, if she does not improve in the next week or so she will be calling the office to follow up on this.   Jose Ngo, MD [facility] Internal Medicine, PGY-1 Phone: [phone] Date 05/13/2023 Time 3:03 PM

## 2023-05-19 ENCOUNTER — Other Ambulatory Visit: Payer: Self-pay

## 2023-05-24 NOTE — Progress Notes (Signed)
 Internal Medicine Clinic Attending  Case discussed with the resident at the time of the visit.  We reviewed the resident's history and exam and pertinent patient test results.  I agree with the assessment, diagnosis, and plan of care documented in the resident's note.

## 2023-06-07 ENCOUNTER — Other Ambulatory Visit: Payer: Self-pay | Admitting: Student

## 2023-06-07 ENCOUNTER — Other Ambulatory Visit: Payer: Self-pay

## 2023-06-08 ENCOUNTER — Other Ambulatory Visit: Payer: Self-pay

## 2023-06-08 MED ORDER — INSULIN SYRINGE-NEEDLE U-100 31G X 5/16" 0.5 ML MISC
3 refills | Status: DC
  Filled 2023-06-08: qty 100, 33d supply, fill #0

## 2023-06-15 ENCOUNTER — Other Ambulatory Visit: Payer: Self-pay

## 2023-06-15 ENCOUNTER — Ambulatory Visit: Payer: Self-pay | Admitting: Student

## 2023-06-15 VITALS — BP 137/80 | HR 84 | Temp 98.0°F | Ht 61.0 in | Wt 163.2 lb

## 2023-06-15 DIAGNOSIS — I10 Essential (primary) hypertension: Secondary | ICD-10-CM

## 2023-06-15 DIAGNOSIS — Z794 Long term (current) use of insulin: Secondary | ICD-10-CM

## 2023-06-15 DIAGNOSIS — E119 Type 2 diabetes mellitus without complications: Secondary | ICD-10-CM

## 2023-06-15 DIAGNOSIS — N951 Menopausal and female climacteric states: Secondary | ICD-10-CM

## 2023-06-15 MED ORDER — TRULICITY 3 MG/0.5ML ~~LOC~~ SOAJ
3.0000 mg | SUBCUTANEOUS | 0 refills | Status: DC
  Filled 2023-06-15: qty 2, 28d supply, fill #0

## 2023-06-15 MED ORDER — DULOXETINE HCL 30 MG PO CPEP
30.0000 mg | ORAL_CAPSULE | Freq: Every day | ORAL | 2 refills | Status: DC
  Filled 2023-06-15: qty 30, 30d supply, fill #0

## 2023-06-15 MED ORDER — TRULICITY 4.5 MG/0.5ML ~~LOC~~ SOAJ
4.5000 mg | SUBCUTANEOUS | 0 refills | Status: DC
  Filled 2023-06-15: qty 2, 28d supply, fill #0

## 2023-06-15 MED ORDER — TRULICITY 0.75 MG/0.5ML ~~LOC~~ SOAJ
0.7500 mg | SUBCUTANEOUS | 0 refills | Status: DC
  Filled 2023-06-15: qty 2, 28d supply, fill #0

## 2023-06-15 MED ORDER — TRULICITY 1.5 MG/0.5ML ~~LOC~~ SOAJ
1.5000 mg | SUBCUTANEOUS | 0 refills | Status: DC
  Filled 2023-06-15: qty 2, 28d supply, fill #0

## 2023-06-15 NOTE — Patient Instructions (Addendum)
 Robin Klein, Robin Klein por permitirnos brindarle atencin hoy.   He ordenado los siguientes laboratorios para usted:  Lab Orders         Hemoglobin A1C         Lipid Profile       Pruebas ordenadas hoy:  Referencias ordenadas hoy:  Referral Orders  No referral(s) requested today     He ordenado el siguiente medicamento/cambiado los siguientes medicamentos:  Suspender los siguientes medicamentos: There are no discontinued medications.   Iniciar los siguientes medicamentos: Meds ordered this encounter  Medications   Dulaglutide  (TRULICITY ) 0.75 MG/0.5ML SOAJ    Sig: Inject 0.75 mg into the skin once a week.    Dispense:  2 mL    Refill:  0    IM Program/DOH Program please   Dulaglutide  (TRULICITY ) 1.5 MG/0.5ML SOAJ    Sig: Inject 1.5 mg into the skin once a week.    Dispense:  2 mL    Refill:  0   Dulaglutide  (TRULICITY ) 3 MG/0.5ML SOAJ    Sig: Inject 3 mg as directed once a week.    Dispense:  2 mL    Refill:  0   Dulaglutide  (TRULICITY ) 4.5 MG/0.5ML SOAJ    Sig: Inject 4.5 mg as directed once a week.    Dispense:  2 mL    Refill:  0   DULoxetine (CYMBALTA) 30 MG capsule    Sig: Take 1 capsule (30 mg total) by mouth daily.    Dispense:  30 capsule    Refill:  2     Seguimiento 6-8 semanas/6-8 weeks for diabetes/vasomotor symptoms/labs   Recordar:   - Empieze a injectarse Trulicity  0.75 mg una vez cada semana. Despues de tomar por un mes puede empezar a tomarla dosis the 1.5 mg, un mes despues la de 3.0 mg, y un mes despues la de 4.5. Ya cuando llegue a la dosis maxima de 4.5 mg podemos determinar si seria mejor cambiarla a Ozempic.   - Continue con las 56 unidades de Basaglar  dos veces al dia, Humalog  20 unidades tres veces al dia, y Lipitor 20 mg una vez al dia. Cuando regrese a su proxima visita podemos repeter el examen del A1c y del colesterol.   - Puede empezar a tomar Cymbalta 30 mg diarios para los sintomas de sudor/calor/anciedad que  probablemente estan relacionados con la menopausa. Cuando usted regrese a su proxima visita podemos determinar si es la dosis correcta o si necesita una dosis mas alta. Este medicamento puede causar nausea, boca seca, cansansio, cambios en el apetito, y [place]. Es importante tomar suficiente agua mientras que esta tomando [place].   - La ayudaremos a establecer una cita para que obtenga su mamograma. La llamaremos con mas informacion.   Si tiene alguna pregunta o inquietud, llame a la clnica de medicina interna al [phone].    Robin Runkle Arellano Zameza, MD PGY-1 Internal Medicine Teaching Program  [facility]

## 2023-06-16 ENCOUNTER — Other Ambulatory Visit: Payer: Self-pay

## 2023-06-17 ENCOUNTER — Other Ambulatory Visit: Payer: Self-pay

## 2023-06-18 DIAGNOSIS — N951 Menopausal and female climacteric states: Secondary | ICD-10-CM | POA: Insufficient documentation

## 2023-06-18 NOTE — Assessment & Plan Note (Signed)
 BP 148/83 and 137/80 here. Endorsed taking amlodipine  10 mg sometimes. States she is trying to make some positive lifestyle changes. Denies any chest pain, heart palpitations, or lower extremity swelling today. CV exam unremarkable. Recommend continuing amlodipine .  Plan - Continue amlodipine  10 mg daily

## 2023-06-18 NOTE — Assessment & Plan Note (Signed)
 Patient states her menstrual periods have become irregular over the last year. Has not had a menstrual period since April. Was concerned about pregnancy but pregnancy test yesterday was negative. Continues to be sexually active. Has experienced sweating, feeling hot, and some anxiety as well. Discussed likelihood of symptoms being vasomotor symptoms associated with perimenopause and treatment options including SNRI. Patient agreeable to trying Cymbalta , will start at low dose today and follow up at next appointment.  Plan - START Cymbalta  30 mg daily

## 2023-06-18 NOTE — Assessment & Plan Note (Signed)
 Patient's A1c 9.7% on 05/13/23. Has been taking 56 units Basaglar  BID and humalog  20 TID. Has also been taking lipitor 20 mg daily. Tolerating medications well. States glucose levels range from 80s-400s. Feels well today, has been drinking more water, stopped drinking sodas. Still working on cutting down on her carbohydrate consumption especially bread. Has tried other diabetic medications such as Victoza  and Metformin  but these cause nausea so patient is not taking. Trulicity  was tried in the past but stopped due to cost. Discussed restarting Trulicity  as it is now covered by a special program and the patient can get it at no cost. Patient is agreeable, will return in 6-8 weeks for medication follow up and can repeat A1c at that time.  Plan - Continue Basaglar  56 units BID  - Continue Humalog  20 units TID - START Trulicity  0.75 mg weekly injections, future doses sent to pharmacy please ensure patient is getting refills with appropriate doses  - Repeat A1c at follow up appointment

## 2023-06-18 NOTE — Progress Notes (Signed)
 Established Patient Office Visit  Subjective   Patient ID: Robin Klein, female    DOB: 07-21-1970  Age: 53 y.o. MRN: [id number]  Chief Complaint  Patient presents with   Follow-up    Patient is a 53 y.o. with a past medical history stated below who presents today for follow-up for diabetes. She was last seen at [facility] on 05/13/2023. Of note patient missed call to schedule mammogram, will need assistance to reschedule. Please see problem based assessment and plan for additional details.     Past Medical History:  Diagnosis Date   Depression    Diabetes mellitus    Fatty liver disease, nonalcoholic    Hypertension    Sinusitis     Review of Systems  Constitutional:  Negative for fever.  Cardiovascular:  Negative for chest pain.  Gastrointestinal:  Negative for abdominal pain, nausea and vomiting.  Genitourinary:  Negative for dysuria.      Objective:     BP 137/80 (BP Location: Left Arm, Patient Position: Sitting, Cuff Size: Small)   Pulse 84   Temp 98 F (36.7 C) (Oral)   Ht 5' 1 (1.549 m)   Wt 163 lb 3.2 oz (74 kg)   SpO2 99%   BMI 30.84 kg/m  BP Readings from Last 3 Encounters:  06/15/23 137/80  05/13/23 119/76  04/15/23 128/69   Wt Readings from Last 3 Encounters:  06/15/23 163 lb 3.2 oz (74 kg)  05/13/23 152 lb 8 oz (69.2 kg)  04/15/23 149 lb 14.4 oz (68 kg)   Physical Exam HENT:     Head: Normocephalic and atraumatic.   Cardiovascular:     Rate and Rhythm: Normal rate and regular rhythm.     Pulses: Normal pulses.     Heart sounds: Normal heart sounds.  Pulmonary:     Effort: Pulmonary effort is normal.     Breath sounds: Normal breath sounds.  Abdominal:     General: Abdomen is flat.     Palpations: Abdomen is soft.   Musculoskeletal:        General: No swelling. Normal range of motion.   Skin:    General: Skin is warm and dry.   Neurological:     General: No focal deficit present.     Mental Status: She is alert.    Psychiatric:        Mood and Affect: Mood normal.    No results found for any visits on 06/15/23.  Last hemoglobin A1c Lab Results  Component Value Date   HGBA1C 9.7 (A) 05/13/2023   The 10-year ASCVD risk score (Arnett DK, et al., 2019) is: 4.3%    Assessment & Plan:   Problem List Items Addressed This Visit     Type 2 diabetes mellitus (HCC) - Primary (Chronic)   Patient's A1c 9.7% on 05/13/23. Has been taking 56 units Basaglar  BID and humalog  20 TID. Has also been taking lipitor 20 mg daily. Tolerating medications well. States glucose levels range from 80s-400s. Feels well today, has been drinking more water, stopped drinking sodas. Still working on cutting down on her carbohydrate consumption especially bread. Has tried other diabetic medications such as Victoza  and Metformin  but these cause nausea so patient is not taking. Trulicity  was tried in the past but stopped due to cost. Discussed restarting Trulicity  as it is now covered by a special program and the patient can get it at no cost. Patient is agreeable, will return in 6-8 weeks for medication follow  up and can repeat A1c at that time.  Plan - Continue Basaglar  56 units BID  - Continue Humalog  20 units TID - START Trulicity  0.75 mg weekly injections, future doses sent to pharmacy please ensure patient is getting refills with appropriate doses  - Repeat A1c at follow up appointment      Relevant Medications   Dulaglutide  (TRULICITY ) 0.75 MG/0.5ML SOAJ   Dulaglutide  (TRULICITY ) 1.5 MG/0.5ML SOAJ (Start on 07/15/2023)   Dulaglutide  (TRULICITY ) 3 MG/0.5ML SOAJ (Start on 08/15/2023)   Dulaglutide  (TRULICITY ) 4.5 MG/0.5ML SOAJ (Start on 09/15/2023)   Other Relevant Orders   Hemoglobin A1C   Lipid Profile   Essential hypertension, benign (Chronic)   BP 148/83 and 137/80 here. Endorsed taking amlodipine  10 mg sometimes. States she is trying to make some positive lifestyle changes. Denies any chest pain, heart palpitations, or  lower extremity swelling today. CV exam unremarkable. Recommend continuing amlodipine .  Plan - Continue amlodipine  10 mg daily       Perimenopausal vasomotor symptoms   Patient states her menstrual periods have become irregular over the last year. Has not had a menstrual period since April. Was concerned about pregnancy but pregnancy test yesterday was negative. Continues to be sexually active. Has experienced sweating, feeling hot, and some anxiety as well. Discussed likelihood of symptoms being vasomotor symptoms associated with perimenopause and treatment options including SNRI. Patient agreeable to trying Cymbalta , will start at low dose today and follow up at next appointment.  Plan - START Cymbalta  30 mg daily       Patient discussed with Dr. Jarvis Mesa.  Return 6-8 weeks, for diabetes/vasomotor symptoms/labs.   Denario Bagot Michelina Aho, MD PGY-1, [facility]

## 2023-06-21 ENCOUNTER — Other Ambulatory Visit: Payer: Self-pay

## 2023-06-21 NOTE — Progress Notes (Signed)
 Internal Medicine Clinic Attending  Case discussed with the resident at the time of the visit.  We reviewed the resident's history and exam and pertinent patient test results.  I agree with the assessment, diagnosis, and plan of care documented in the resident's note.

## 2023-07-19 ENCOUNTER — Other Ambulatory Visit: Payer: Self-pay

## 2023-08-13 ENCOUNTER — Other Ambulatory Visit: Payer: Self-pay

## 2023-08-13 ENCOUNTER — Other Ambulatory Visit: Payer: Self-pay | Admitting: Student

## 2023-08-13 DIAGNOSIS — E1165 Type 2 diabetes mellitus with hyperglycemia: Secondary | ICD-10-CM

## 2023-08-16 ENCOUNTER — Other Ambulatory Visit: Payer: Self-pay | Admitting: Student

## 2023-08-16 ENCOUNTER — Other Ambulatory Visit: Payer: Self-pay

## 2023-08-16 MED ORDER — TRUE METRIX METER W/DEVICE KIT
PACK | 1 refills | Status: AC
  Filled 2023-08-16: qty 1, 30d supply, fill #0

## 2023-08-16 MED ORDER — TRUE METRIX BLOOD GLUCOSE TEST VI STRP
ORAL_STRIP | 12 refills | Status: DC
  Filled 2023-08-16: qty 100, 33d supply, fill #0
  Filled 2023-09-27: qty 100, 33d supply, fill #1

## 2023-08-16 MED ORDER — TRUEPLUS LANCETS 28G MISC
3 refills | Status: AC
  Filled 2023-08-16: qty 100, 33d supply, fill #0
  Filled 2023-12-16: qty 100, 33d supply, fill #1

## 2023-08-17 ENCOUNTER — Other Ambulatory Visit: Payer: Self-pay

## 2023-08-20 ENCOUNTER — Other Ambulatory Visit: Payer: Self-pay

## 2023-08-24 ENCOUNTER — Other Ambulatory Visit: Payer: Self-pay

## 2023-08-24 ENCOUNTER — Other Ambulatory Visit: Payer: Self-pay | Admitting: Student

## 2023-08-24 DIAGNOSIS — E119 Type 2 diabetes mellitus without complications: Secondary | ICD-10-CM

## 2023-08-24 MED ORDER — BASAGLAR KWIKPEN 100 UNIT/ML ~~LOC~~ SOPN
56.0000 [IU] | PEN_INJECTOR | Freq: Two times a day (BID) | SUBCUTANEOUS | 3 refills | Status: DC
  Filled 2023-08-24: qty 30, 27d supply, fill #0

## 2023-08-24 NOTE — Telephone Encounter (Signed)
 Medication sent to pharmacy

## 2023-08-25 ENCOUNTER — Other Ambulatory Visit: Payer: Self-pay

## 2023-08-30 ENCOUNTER — Ambulatory Visit: Payer: Self-pay | Admitting: Student

## 2023-08-30 ENCOUNTER — Other Ambulatory Visit (HOSPITAL_COMMUNITY): Payer: Self-pay

## 2023-08-30 VITALS — BP 138/74 | HR 83 | Temp 97.7°F | Ht 61.0 in | Wt 166.0 lb

## 2023-08-30 DIAGNOSIS — N951 Menopausal and female climacteric states: Secondary | ICD-10-CM

## 2023-08-30 DIAGNOSIS — Z794 Long term (current) use of insulin: Secondary | ICD-10-CM

## 2023-08-30 DIAGNOSIS — Z7984 Long term (current) use of oral hypoglycemic drugs: Secondary | ICD-10-CM

## 2023-08-30 DIAGNOSIS — R519 Headache, unspecified: Secondary | ICD-10-CM

## 2023-08-30 DIAGNOSIS — E119 Type 2 diabetes mellitus without complications: Secondary | ICD-10-CM

## 2023-08-30 DIAGNOSIS — I10 Essential (primary) hypertension: Secondary | ICD-10-CM

## 2023-08-30 LAB — GLUCOSE, CAPILLARY: Glucose-Capillary: 271 mg/dL — ABNORMAL HIGH (ref 70–99)

## 2023-08-30 LAB — POCT GLYCOSYLATED HEMOGLOBIN (HGB A1C): HbA1c, POC (controlled diabetic range): 10.6 % — AB (ref 0.0–7.0)

## 2023-08-30 MED ORDER — BASAGLAR KWIKPEN 100 UNIT/ML ~~LOC~~ SOPN
56.0000 [IU] | PEN_INJECTOR | Freq: Every day | SUBCUTANEOUS | 3 refills | Status: DC
  Filled 2023-08-30 (×2): qty 15, 26d supply, fill #0

## 2023-08-30 MED ORDER — GLIPIZIDE ER 5 MG PO TB24
5.0000 mg | ORAL_TABLET | Freq: Every day | ORAL | 11 refills | Status: AC
  Filled 2023-08-30 – 2023-09-01 (×2): qty 30, 30d supply, fill #0
  Filled 2023-09-27: qty 30, 30d supply, fill #1
  Filled 2023-11-08: qty 30, 30d supply, fill #2
  Filled 2023-12-16: qty 30, 30d supply, fill #3
  Filled 2024-02-03 (×3): qty 30, 30d supply, fill #4

## 2023-08-30 NOTE — Patient Instructions (Addendum)
 [name] , gracias por permitirnos ayudarle hoy. Durante esta visita hemos hablado acerca de su diabetes  - Cambiamos su Basaglar  a 56 unidades una vez al dia  - Anadimos Glipizide  5 mg diario. Debe tomarlo antes de su comida mas grande del dia.   - Siga tomando su humalog  20 unidades tres veces al dia con cada comida  - Es muy importante que usted chequee su azucar diario (2 veces al dia) y tambien cuando tengas sintomas de hipoglycemia  - Le hemos referido a la farmeceutica para que nos ayude a ajustar sus medicamentos. [name] a llamar por telefono  He ordenado los siguientes exmenes de laboratorio:   Lab Orders         Glucose, capillary         POC Hbg A1C       Cita de seguimiento: 1 mes   Remember: Trate de chequear y notar sus azucares cada dia en la manana y cuando tengas sintomas de hipoglycemia  Para contactarnos: Esperamos verte la prxima vez. Llame a nuestra clnica al telefono: [phone] si tiene alguna pregunta o inquietud. El mejor horario para llamar es de lunes a viernes de 9 a. m. a 4 p. m. Si es fuera del horario de atencin o durante el fin de [place], llame al nmero principal del hospital y pregunte por el residente de guardia de medicina interna. Si necesita reposicin de medicamentos, por favor notifique a su farmacia con una semana de anticipacin y ellos nos enviarn una solicitud.  Gracias por confiar en nosotros para cuidar de usted!   Hadassah [name] Ahr, MD [facility]

## 2023-08-30 NOTE — Assessment & Plan Note (Signed)
 Patient BP is 138/74 today. Patient has been taking amlodipine  10 mg daily. She has noticed some associated leg swelling at the end of a day of standing or sitting for extended periods of time, but notices that it resolves with leg elevation. She denies, CP, SOB, palpitations, loss of vision.  - continue amlodipine  10mg  - recommend compression stockings and elevation for leg swelling

## 2023-08-30 NOTE — Assessment & Plan Note (Signed)
 Patient states that she stopped taking cymbalta  2 weeks ago due to the onset of a headache that was persistent but has been intermittent since stopping cymbalta . She was originally prescribed this due to permenopausal vasomotor symptoms; she denies sweating or hot flashes currently.  - continue to monitor - stop cymbalta 

## 2023-08-30 NOTE — Assessment & Plan Note (Addendum)
 Patient does not have glucometer with her today but estimates that her most recent blood sugars have been in the 400s in the evening and 200s in the morning. Her A1c today is up to 10.6 from 9.7 3 months ago and her instantaneous glucose is 271. She has been trying to make dietary changes by including more natural juices with spinach and green apple, decreasing bread, and decreasing tortilla consumption. She was on a regimen of Basaglar  56 BID, humalog  20 units TID, trulicity  4.5 weekly injections. However, she stopped Trulicity  in late June due to nausea, fatigue, and onset of headache. 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 She has difficulty keeping up with the frequency of her injections and sometimes misses either humalog  or basaglar . She was previously on a combination metformin -glipizide  pill that she was not able to tolerate due to GI upset 2/2 to metformin , but she is amenable to adjusting her regimen to include glipizide  and lower frequency of basaglar  to 56 units once daily.  - glipizide  5 mg daily - basaglar  56 units daily - humalog  20 units TID - referral to pharmacist for medication reconcilliation

## 2023-08-30 NOTE — Assessment & Plan Note (Addendum)
 Patient has noticed the onset of a headache in the last 2-3 weeks that is mostly frontal and continues throughout her day. She notices it more when she is doing chores, and has not had it in the morning when she wakes up. She stopped trulicity  and later on Cymbalta  since her last visit in 06/2023 due to concern that these medications were causing her headache. She continues to have headache today in office. She has associated blurry vision. She uses tylenol  occasionally and feels that it partially but not completely helps the headache. She notices that cold temperatures, such as sitting in the fridge at her job or taking cold showers helps the pain. She denies symptoms of limb weakness, dysarthria, aphasia. In the setting of increased blood sugars to 400s in the evening, medication non-adherence, polyuria, and polydipsia, cause of headache is most likely 2/2 to osmotic diuresis from hyperglycemia and dehydration. Less likely due to neurologic conditions such as MS due to lack of associated episodic neurologic symptoms.  - See A & P for type 2 diabetes mellitus - continue to monitor

## 2023-08-30 NOTE — Progress Notes (Unsigned)
 This is a Psychologist, occupational Note.  The care of the patient was discussed with Dr. Elnora and the assessment and plan was formulated with their assistance.  Please see their note for official documentation of the patient encounter.   Subjective:   Patient ID: Robin Klein female   DOB: 1970/12/07 53 y.o.   MRN: [id number]  HPI: [name] is a 53 y.o. with PMH of T2DM, HLD, and HTN who presents for follow up today.   For the details of today's visit, please refer to the assessment and plan.     Past Medical History:  Diagnosis Date   Depression    Diabetes mellitus    Fatty liver disease, nonalcoholic    Hypertension    Sinusitis    Current Outpatient Medications  Medication Sig Dispense Refill   glipiZIDE  (GLUCOTROL  XL) 5 MG 24 hr tablet Take 1 tablet (5 mg total) by mouth daily with breakfast. 30 tablet 11   amLODipine  (NORVASC ) 10 MG tablet Take 1 tablet (10 mg total) by mouth daily. 90 tablet 1   atorvastatin  (LIPITOR) 20 MG tablet Take 1 tablet (20 mg total) by mouth daily. 30 tablet 11   Blood Glucose Monitoring Suppl (TRUE METRIX METER) w/Device KIT Use to check blood sugar three times a day. 1 kit 1   Dulaglutide  (TRULICITY ) 0.75 MG/0.5ML SOAJ Inject 0.75 mg into the skin once a week. 2 mL 0   Dulaglutide  (TRULICITY ) 1.5 MG/0.5ML SOAJ Inject 1.5 mg into the skin once a week. 2 mL 0   Dulaglutide  (TRULICITY ) 3 MG/0.5ML SOAJ Inject 3 mg as directed once a week. 2 mL 0   [START ON 09/15/2023] Dulaglutide  (TRULICITY ) 4.5 MG/0.5ML SOAJ Inject 4.5 mg as directed once a week. 2 mL 0   DULoxetine  (CYMBALTA ) 30 MG capsule Take 1 capsule (30 mg total) by mouth daily. 30 capsule 2   glucose blood (TRUE METRIX BLOOD GLUCOSE TEST) test strip Use as instructed 100 each 12   Insulin  Glargine (BASAGLAR  KWIKPEN) 100 UNIT/ML Inject 56 Units into the skin daily. 15 mL 3   insulin  lispro (HUMALOG ) 100 UNIT/ML injection Inject 0.2 mLs (20 Units total) into the  skin 3 (three) times daily before meals. 10 mL 11   Insulin  Pen Needle (PEN NEEDLES) 31G X 5 MM MISC Inject 1 each into the skin 4 (four) times daily -  before meals and at bedtime. 100 each 0   Insulin  Syringe-Needle U-100 (GNP INSULIN  SYRINGE) 31G X 5/16 0.5 ML MISC Use to inject insulin  daily as instructed 100 each 3   Lancets MISC Use to check blood sugar as directed 100 each 3   TRUEplus Lancets 28G MISC Use to check blood sugar three times a day. 100 each 3   No current facility-administered medications for this visit.    Review of Systems: Pertinent items are noted in HPI. Objective:  Physical Exam: Vitals:   08/30/23 1430  BP: 138/74  Pulse: 83  Temp: 97.7 F (36.5 C)  TempSrc: Oral  SpO2: 98%  Weight: 166 lb (75.3 kg)  Height: 5' 1 (1.549 m)    Constitutional: NAD, appears comfortable Cardiovascular: RRR, no murmurs, rubs, or gallops.  Pulmonary/Chest: CTAB, no wheezes, rales, or rhonchi.  Extremities: Warm and well perfused. Distal pulses intact. No edema.  Neuro: 5/5 strength of bilateral upper and lower extremities. Sensation intact in bilateral upper and lower extremities. CN II,III, VII intact. Psychiatric: Normal mood and affect  Assessment & Plan:  Type 2 diabetes mellitus (HCC) Patient does not have glucometer with her today but estimates that her most recent blood sugars have been in the 400s in the evening and 200s in the morning. Her A1c today is up to 10.6 from 9.7 3 months ago and her instantaneous glucose is 271. She has been trying to make dietary changes by including more natural juices with spinach and green apple, decreasing bread, and decreasing tortilla consumption. She was on a regimen of Basaglar  56 BID, humalog  20 units TID, trulicity  4.5 weekly injections. However, she stopped Trulicity  in late June due to nausea, fatigue, and onset of headache. 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 She has difficulty  keeping up with the frequency of her injections and sometimes misses either humalog  or basaglar . She was previously on a combination metformin -glipizide  pill that she was not able to tolerate due to GI upset 2/2 to metformin , but she is amenable to adjusting her regimen to include glipizide  and lower frequency of basaglar  to 56 units once daily.  - glipizide  5 mg daily - basaglar  56 units daily - humalog  20 units TID - referral to pharmacist for medication reconcilliation  Perimenopausal vasomotor symptoms Patient states that she stopped taking cymbalta  2 weeks ago due to the onset of a headache that was persistent but has been intermittent since stopping cymbalta . She was originally prescribed this due to permenopausal vasomotor symptoms; she denies sweating or hot flashes currently.  - continue to monitor - stop cymbalta   Headache Patient has noticed the onset of a headache in the last 2-3 weeks that is mostly frontal and continues throughout her day. She notices it more when she is doing chores, and has not had it in the morning when she wakes up. She stopped trulicity  and later on Cymbalta  since her last visit in 06/2023 due to concern that these medications were causing her headache. She continues to have headache today in office. She has associated blurry vision. She uses tylenol  occasionally and feels that it partially but not completely helps the headache. She notices that cold temperatures, such as sitting in the fridge at her job or taking cold showers helps the pain. She denies symptoms of limb weakness, dysarthria, aphasia. In the setting of increased blood sugars to 400s in the evening, medication non-adherence, polyuria, and polydipsia, cause of headache is most likely 2/2 to osmotic diuresis from hyperglycemia and dehydration. Less likely due to neurologic conditions such as MS due to lack of associated episodic neurologic symptoms.  - See A & P for type 2 diabetes mellitus - continue to  monitor   Essential hypertension, benign Patient BP is 138/74 today. Patient has been taking amlodipine  10 mg daily. She has noticed some associated leg swelling at the end of a day of standing or sitting for extended periods of time, but notices that it resolves with leg elevation. She denies, CP, SOB, palpitations, loss of vision.  - continue amlodipine  10mg  - recommend compression stockings and elevation for leg swelling

## 2023-08-31 ENCOUNTER — Encounter: Payer: Self-pay | Admitting: Student

## 2023-08-31 ENCOUNTER — Other Ambulatory Visit: Payer: Self-pay

## 2023-09-01 ENCOUNTER — Telehealth: Payer: Self-pay | Admitting: *Deleted

## 2023-09-01 ENCOUNTER — Other Ambulatory Visit (HOSPITAL_COMMUNITY): Payer: Self-pay

## 2023-09-01 ENCOUNTER — Other Ambulatory Visit: Payer: Self-pay

## 2023-09-01 NOTE — Progress Notes (Signed)
 Care Guide Pharmacy Note  09/01/2023 Name: Robin Klein MRN: [id number] DOB: March 05, 1970  Referred By: [name], MD Reason for referral: Complex Care Management (Initial outreach to schedule referral with PharmD)   [name] Grasiela Jonsson is a 53 y.o. year old female who is a primary care patient of [name], MD.  [name] was referred to the pharmacist for assistance related to: DMII  Successful contact was made with the patient to discuss pharmacy services including being ready for the pharmacist to call at least 5 minutes before the scheduled appointment time and to have medication bottles and any blood pressure readings ready for review. The patient agreed to meet with the pharmacist via in office face to face  on (date/time).9/11 at [facility] [employer] PI#[id number] named [name] Guide  Direct Dial: [phone]  Fax [phone]

## 2023-09-10 NOTE — Progress Notes (Signed)
 Internal Medicine Clinic Attending  Case discussed with the resident and student at the time of the visit.  We reviewed the student's history and exam and pertinent patient test results.  I agree with the assessment, diagnosis, and plan of care documented in the student's note.

## 2023-09-16 ENCOUNTER — Other Ambulatory Visit: Payer: Self-pay

## 2023-09-16 ENCOUNTER — Ambulatory Visit (INDEPENDENT_AMBULATORY_CARE_PROVIDER_SITE_OTHER): Payer: Self-pay

## 2023-09-16 DIAGNOSIS — E782 Mixed hyperlipidemia: Secondary | ICD-10-CM

## 2023-09-16 DIAGNOSIS — E119 Type 2 diabetes mellitus without complications: Secondary | ICD-10-CM

## 2023-09-16 DIAGNOSIS — E1165 Type 2 diabetes mellitus with hyperglycemia: Secondary | ICD-10-CM

## 2023-09-16 DIAGNOSIS — Z794 Long term (current) use of insulin: Secondary | ICD-10-CM

## 2023-09-16 DIAGNOSIS — I10 Essential (primary) hypertension: Secondary | ICD-10-CM

## 2023-09-16 MED ORDER — INSULIN LISPRO (1 UNIT DIAL) 100 UNIT/ML (KWIKPEN)
24.0000 [IU] | PEN_INJECTOR | Freq: Three times a day (TID) | SUBCUTANEOUS | 11 refills | Status: AC
  Filled 2023-09-16: qty 24, 27d supply, fill #0
  Filled 2023-11-08: qty 24, 27d supply, fill #1
  Filled 2023-12-16: qty 24, 27d supply, fill #2
  Filled 2024-01-27: qty 24, 27d supply, fill #3

## 2023-09-16 MED ORDER — ROSUVASTATIN CALCIUM 5 MG PO TABS
5.0000 mg | ORAL_TABLET | Freq: Every day | ORAL | 3 refills | Status: DC
  Filled 2023-09-16: qty 90, 90d supply, fill #0

## 2023-09-16 MED ORDER — AMLODIPINE BESYLATE 10 MG PO TABS
10.0000 mg | ORAL_TABLET | Freq: Every day | ORAL | 3 refills | Status: DC
  Filled 2023-09-16: qty 90, 90d supply, fill #0

## 2023-09-16 MED ORDER — PEN NEEDLES 31G X 5 MM MISC: Status: DC

## 2023-09-16 MED ORDER — BASAGLAR KWIKPEN 100 UNIT/ML ~~LOC~~ SOPN
60.0000 [IU] | PEN_INJECTOR | Freq: Every day | SUBCUTANEOUS | 3 refills | Status: AC
  Filled 2023-09-16: qty 18, 30d supply, fill #0
  Filled 2023-11-15: qty 18, 30d supply, fill #1
  Filled 2023-12-16: qty 18, 22d supply, fill #2
  Filled 2024-01-07: qty 18, 22d supply, fill #3

## 2023-09-16 NOTE — Progress Notes (Signed)
 09/16/2023 Name: Robin Klein MRN: [id number] DOB: [birth year]  Chief Complaint  Patient presents with   Diabetes   Hypertension   Hyperlipidemia    Robin Klein is a 53 y.o. year old female who was referred for medication management by their primary care provider, [name], MD. They were scheduled for a face-to-face visit, but it was transitioned to a telephone visit at the patient's request. Visit was conducted with assistance from [facility] [phone] and [phone].    They were referred to the pharmacist by their PCP for assistance in managing diabetes . PMH includes HTN, MASLD, T2DM, HLD, BMI > 30.    Subjective: Patient was last seen by PCP, Dr. [name], on 08/30/23. At last visit, BP was 138/74 mmHg, HR 83. At this time, she reported that her recent BG have been in the 400s in the evenings and 200s in the morning. A1C increased from 9.7% to 10.6%. She had stopped Trulicity  (0.75 mg weekly) in late June due to nausea, fatigue, and onset of HA. She reports sometimes missing doses of either Humalog  or Basaglar . She was isntructed to take Basaglar  56 units daily, Humalog  20 units TID, and glipizide  5 mg daily.   Today, patient reports doing well. She reports that she has been taking her insulin  and medications more consistently since her recent PCP visit, but her BG continue to be elevated.  Care Team: Primary Care Provider: [name], MD ; Next Scheduled Visit: needs to be scheduled  Medication Access/Adherence  Current Pharmacy:  [facility] - [facility] [address], [address] [place] [address] Phone: [phone] Fax: [phone]  [facility] - [place], [place] - [address] [place] [address] Phone: [phone] Fax: [phone]  [facility] - [place], [place] - [address] [place] [address] [place]  [address] Phone: [phone] Fax: [phone]   Patient reports affordability concerns with their medications: Yes  - [facility]ses [facility] supply, no insurance Patient reports access/transportation concerns to their pharmacy: No  Patient reports adherence concerns with their medications:  Yes  - hx of not taking medications consistently  Denies missing doses of Basaglar  and Humalog  frequently since her visit on 08/30/23. Endorses one missed dose of Basaglar  over the past few weeks.  Diabetes:  Current medications: Basaglar  56 units daily in the morning, Humalog  (vial) 20 mg TID with meals, glipizide  XL 5 mg daily (taking after breakfast) Medications tried in the past: Trulicity  0.75 mg weekly (nausea), metformin  IR and XR (GI AE), Jardiance  (cost)  Reports that she would like to switch to the Humalog  kwikpen rather than vial/syringe, but she thought it was not covered through the via supply  Current glucose readings:  True Metrix Meter: 8/28: FBG 288 mg/dL, PM 606 mg/dL 1/70: FBG 677 mg/dL, PM 529 mg/dL 1/69: FBG 779 mg/dL 1/68: FBG 694 mg/dL 9/1: FBG 656 mg/dL 9/2: FBG 696 mg/dL, PM (before dinner) 680 mg/dL 9/3: FBG 674 mg/dL 9/4: FBG 707 mg/dL   Patient denies hypoglycemic s/sx including dizziness, shakiness, sweating. Patient denies hyperglycemic symptoms including polyuria, polydipsia, polyphagia, nocturia, neuropathy, blurred vision.  Current meal patterns: Patient reports eating 3 meals/day.  Reports that she has cut back on sugary foods like sweet bread, chocolate. She reports that she is no longer eating rice. She is trying to cut back on tortillas (has cut back to 5 tortillas per day).  -  Yesterday: had two pieces of pizza and soda - Eating fruits like cantaloupe, watermelon, strawberries. - Drinks: reports she is currently trying to cut back on coca cola intake - she reports that she would rather cut out soda completely than have a diet or sugar free soda.  Current physical  activity: Walkings in the mornings and the evenings  Current medication access support: [facility]  Hypertension:  Current medications: amlodipine  10 mg daily (reports she is taking regularly) Medications previously tried: lisinopril  (swelling), losartan  (n/v), hydrochlorothiazide  (rash)   Hyperlipidemia/ASCVD Risk Reduction  Current lipid lowering medications: atorvastatin  20 mg daily (not consistently filling - reports she is not taking consistently due to side effects including headaches and nausea) Medications tried in the past: n/a  Clinical ASCVD: No  The 10-year ASCVD risk score (Arnett DK, et al., 2019) is: 4.4%   Values used to calculate the score:     Age: 63 years     Clincally relevant sex: Female     Is Non-Hispanic African American: No     Diabetic: Yes     Tobacco smoker: No     Systolic Blood Pressure: 138 mmHg     Is BP treated: Yes     HDL Cholesterol: 44 mg/dL     Total Cholesterol: 173 mg/dL    Objective:  BP Readings from Last 3 Encounters:  08/30/23 138/74  06/15/23 137/80  05/13/23 119/76    Lab Results  Component Value Date   HGBA1C 10.6 (A) 08/30/2023   HGBA1C 9.7 (A) 05/13/2023   HGBA1C 10.5 (A) 02/02/2023       Latest Ref Rng & Units 03/17/2023    2:22 PM 02/13/2022    9:35 AM 02/06/2022    6:54 PM  BMP  Glucose 70 - 99 mg/dL 77  827  668   BUN 6 - 24 mg/dL 9  10  19    Creatinine 0.57 - 1.00 mg/dL 9.39  9.42  9.59   BUN/Creat Ratio 9 - 23 15  18     Sodium 134 - 144 mmol/L 143  142  138   Potassium 3.5 - 5.2 mmol/L 3.5  4.0  3.8   Chloride 96 - 106 mmol/L 105  102  102   CO2 20 - 29 mmol/L 21  21    Calcium  8.7 - 10.2 mg/dL 9.4  9.4      Lab Results  Component Value Date   CHOL 173 03/17/2023   HDL 44 03/17/2023   LDLCALC 110 (H) 03/17/2023   TRIG 101 03/17/2023   CHOLHDL 3.9 03/17/2023    Medications Reviewed Today     Reviewed by Brinda Lorain SQUIBB, RPH (Pharmacist) on 09/16/23 at 1050  Med List Status: <None>   Medication Order  Taking? Sig Documenting Provider Last Dose Status Informant  amLODipine  (NORVASC ) 10 MG tablet [id number] Yes Take 1 tablet (10 mg total) by mouth daily. Gomez-Caraballo, Eternity, MD  Active    Discontinued 09/16/23 [phone] (Side effect (s))   Blood Glucose Monitoring Suppl (TRUE METRIX METER) w/Device KIT [id number]  Use to check blood sugar three times a day. Marylu Gee, DO  Active     Discontinued 09/16/23 0951 (Side effect (s))     Discontinued 09/16/23 0951 (Side effect (s))     Discontinued 09/16/23 0951 (Side effect (s))     Discontinued 09/16/23 0951 (Side effect (s))     Discontinued 09/16/23 0954 (Side effect (s))   glipiZIDE  (GLUCOTROL  XL) 5 MG 24 hr tablet [id number] Yes  Take 1 tablet (5 mg total) by mouth daily with breakfast. Elnora Ip, MD  Active   glucose blood (TRUE METRIX BLOOD GLUCOSE TEST) test strip [id number]  Use as instructed Marylu Gee, DO  Active   Insulin  Glargine (BASAGLAR  KWIKPEN) 100 UNIT/ML [id number]  Inject 60 Units into the skin daily. May increase up to 80 units daily if instructed by your provider. Gomez-Caraballo, Candance, MD  Active   insulin  lispro (HUMALOG  [facility]) 100 UNIT/ML KwikPen [id number] Yes Inject 24 Units into the skin with breakfast, with lunch, and with evening meal. May inject up to 30 units with each meal if instructed by your provider. Elnora Ip, MD  Active    Discontinued 09/16/23 1044 (Change in therapy)            Med Note>> Brinda Lorain SQUIBB, [facility]   09/16/2023 10:44 AM Switching to Kwikpen    Insulin  Pen Needle (PEN NEEDLES) 31G X 5 MM MISC [id number]  Use to inject insulin  as instructed 4 times daily Elnora Ip, MD  Active     Discontinued 09/16/23 [phone] (Change in therapy)   Lancets MISC [id number]  Use to check blood sugar as directed Masters, Izetta, DO  Active   rosuvastatin  (CRESTOR ) 5 MG tablet [id number] Yes Take 1 tablet (5 mg total) by mouth daily. Elnora Ip, MD  Active   TRUEplus Lancets 28G  MISC [id number]  Use to check blood sugar three times a day. Marylu Gee, DO  Active               Assessment/Plan:   Diabetes: - Currently uncontrolled with most recent A1C of 10.6% above goal <7%. Medication adherence appears improved since appt on 08/30/23, however continues to have uncontrolled fasting and post-prandial BG. Expect that dietary excursions are contributing to lack of control, but patient also appears to have significant insulin  resistance. Appropriate to increase basal and prandial insulin  today. She was educated on the importance of diet and lifestyle to control blood sugars. Will consider switching to concentrated insulin  for improved absoprtion if patient is unable to achieve control on increased doses. She has not been able to tolerate GLP-1 or metformin . She is not currently a good candidate for SGLT2i given risk of GU infections with A1C > 10%. - Last UACR March 2025 - 19 mg/g - Reviewed long term cardiovascular and renal outcomes of uncontrolled blood sugar - Reviewed goal A1c, goal fasting, and goal 2 hour post prandial glucose - Reviewed hypoglycemia management plan and the rule of 15 - Reviewed dietary modifications including  utilizing the healthy plate method, limiting portion size of carbohydrate foods, increasing intake of protein and non-starchy vegetables. Counseled patient to stay hydrated with water throughout the day. - Reviewed lifestyle modifications including: aiming for 150 minutes of moderate intensity exercise every week.  - Recommend to INCREASE Basaglar  to 60 units daily. Patient wants to switch to taking her Basalgar in the evening. She was instructed to take her dose tomorrow ~12PM, then ~4PM the next day, then start taking once daily in the evening.  - Recommend to INCREASE Humalog  to 24 units TID with meals. Will switch from Humalog  vial to Kwikpen for ease of use.   - Recommend to continue glipizide  XL 5 mg daily - Recommend to check glucose  twice daily: fasting and 2-hr PPG . Counseled patient to bring glucometer or BG log to every appointment. - Next A1C due 11/30/23     Hypertension: - Currently uncontrolled with clinic BP consistently above goal less  than 130/80. Counseled her on the importance of daily adherence to amlodipine . She did not have refills remaining, so will collaborate with PCP to refill today.  - Reviewed long term cardiovascular and renal outcomes of uncontrolled blood pressure - Recommend to continue amlodipine  10 mg daily    Hyperlipidemia/ASCVD Risk Reduction: - Currently uncontrolled with most recent LDL-C of 110 mg/dL above goal < 70 mg/dL given U7IF + comorbidities. She is not regularly taking atorvastatin  due to side effects. She is willing to trial alternative statin, rosuvastatin . Moderate intensity statin is appropriate.  - Reviewed long term complications of uncontrolled cholesterol - Recommend to stop atorvastatin  and START rosuvastatin  5 mg daily   Patient verbalized understanding of treatment plan.   Follow Up Plan:  Pharmacist in person 10/04/23 PCP clinic visit scheduled today for 12/01/23   Lorain Baseman, PharmD [facility] [phone]

## 2023-09-16 NOTE — Progress Notes (Signed)
 I have reviewed and authorized the pharmacist's assessment, medication management, and clinical interventions performed during this patient encounter. The services rendered are appropriate for billing when applicable, and when not billed separately, this attestation confirms I am providing oversight of the collaborative care provided.

## 2023-09-20 ENCOUNTER — Other Ambulatory Visit: Payer: Self-pay

## 2023-09-21 ENCOUNTER — Other Ambulatory Visit: Payer: Self-pay

## 2023-09-21 MED ORDER — AMOXICILLIN 500 MG PO CAPS
500.0000 mg | ORAL_CAPSULE | Freq: Three times a day (TID) | ORAL | 0 refills | Status: DC
  Filled 2023-09-21: qty 30, 10d supply, fill #0

## 2023-09-27 ENCOUNTER — Other Ambulatory Visit: Payer: Self-pay | Admitting: Student

## 2023-09-27 ENCOUNTER — Other Ambulatory Visit: Payer: Self-pay

## 2023-09-27 DIAGNOSIS — E1165 Type 2 diabetes mellitus with hyperglycemia: Secondary | ICD-10-CM

## 2023-09-27 MED ORDER — PEN NEEDLES 31G X 5 MM MISC
5 refills | Status: DC
  Filled 2023-09-27: qty 100, 25d supply, fill #0
  Filled 2023-11-08: qty 100, 25d supply, fill #1

## 2023-09-27 NOTE — Progress Notes (Signed)
 Re- sent insulin  needles to [facility]

## 2023-10-04 ENCOUNTER — Ambulatory Visit: Payer: Self-pay

## 2023-10-04 VITALS — BP 126/84 | HR 79

## 2023-10-04 DIAGNOSIS — Z794 Long term (current) use of insulin: Secondary | ICD-10-CM

## 2023-10-04 DIAGNOSIS — E119 Type 2 diabetes mellitus without complications: Secondary | ICD-10-CM

## 2023-10-04 NOTE — Progress Notes (Signed)
 10/04/2023 Name: Robin Klein MRN: [id number] DOB: 1970/05/02  Chief Complaint  Patient presents with   Diabetes    Robin Klein is a 53 y.o. year old female who was referred for medication management by their primary care provider, Robin Hadassah, MD. Patient presented for a face-to-face visit. Visit was conducted with assistance from live interpreter.    They were referred to the pharmacist by their PCP for assistance in managing diabetes . PMH includes HTN, MASLD, T2DM, HLD, BMI > 30.    Subjective: Patient was last seen by PCP, Dr. [name], on 08/30/23. At last visit, BP was 138/74 mmHg, HR 83. At this time, she reported that her recent BG have been in the 400s in the evenings and 200s in the morning. A1C increased from 9.7% to 10.6%. She had stopped Trulicity  (0.75 mg weekly) in late June due to nausea, fatigue, and onset of HA. She reports sometimes missing doses of either Humalog  or Basaglar . She was isntructed to take Basaglar  56 units daily, Humalog  20 units TID, and glipizide  5 mg daily. At pharmacy telephone appt on 09/16/23, patient reported improved adherence to her medications, but BG continued to be elevated. She was instructed to increase Basaglar  to 60 units daily and increase Humalog  to 24 units TID with meals. She was also switched from the Humalog  vial to pen for improved adherence. She reported having side effects to atorvastatin , and was instructed to start rosuvastatin  5 mg daily instead.   Today, patient reports doing well.She reports BG are still elevated, but overall improved.   Care Team: Primary Care Provider: [name], DO ; Next Scheduled Visit: 12/01/23  Medication Access/Adherence  Current Pharmacy:  [facility] - [facility] [address], [address] [place] [address] Phone: [phone] Fax: [phone]  [facility] [place] [facility] - [place], [place] - [address] [place] [address] Phone: [phone] Fax: [phone]  [facility] - [place], [place] - [address] [place] [address] [place] [address] Phone: [phone] Fax: [phone]   Patient reports affordability concerns with their medications: Yes  - Uses [facility] supply, no insurance Patient reports access/transportation concerns to their pharmacy: No  Patient reports adherence concerns with their medications:  Yes  - hx of not taking medications consistently  Denies missing doses of Basaglar  and Humalog  frequently since her visit on 08/30/23. Endorses one missed dose of Basaglar  over the past few weeks.  Diabetes:  Current medications: Basaglar  60 units daily in the evening, Humalog  (vial) 24 units TID with meals, glipizide  XL 5 mg daily (taking after breakfast) Medications tried in the past: Trulicity  0.75 mg weekly (nausea), metformin  IR and XR (GI AE), Jardiance  (cost)  Current glucose readings: Forgot to bring her meter with her today, but recalls some from memory True Metrix Meter: 282, 235, 185, 132 (FBG, lowest)  Denies BG < 70 mg/dL, reports occasional BG > 300 mg/dL since increasing insulin  (this occurred after eating more sugary foods at a gathering, also thinks she forgot insulin  that day  Patient denies hypoglycemic s/sx including dizziness, shakiness, sweating. Patient denies hyperglycemic symptoms including polyuria, polydipsia, polyphagia, nocturia, neuropathy, blurred vision.  Current meal patterns: Patient reports eating 3 meals/day (Dinner is her smallest meal).  Reports that she has cut back on sugary foods like sweet bread, chocolate. She reports that she is no longer eating rice. She is trying to cut  back on tortillas (has cut back to 3-4 tortillas per day). Trying not to eat after 7PM.  - Reports she likes to eat salads, spinach, celery. - Limiting servings of carbohydrates - ex: 5-7 potatoes without salt - Eating fruits like  cantaloupe, watermelon, strawberries. - Drinks: reports she is currently trying to cut back on coca cola intake - she reports that she would rather cut out soda completely than have a diet or sugar free soda. Reports she is drinking a lot of water throughout the day - has only had a small amount of cola at a recent gathering.   Current physical activity: Walkings in the mornings and the evenings  Current medication access support: [facility]  Hypertension:  Current medications: amlodipine  10 mg daily (reports she is taking regularly - she did take it yesterday evening) Medications previously tried: lisinopril  (swelling), losartan  (n/v), hydrochlorothiazide  (rash)  BP 135/85, HR 90 - 2 days.   Hyperlipidemia/ASCVD Risk Reduction  Current lipid lowering medications: rosuvastatin  5 mg daily (reports she is tolerating better) Medications tried in the past: atorvastatin  - HA, nausea  Clinical ASCVD: No  The 10-year ASCVD risk score (Arnett DK, et al., 2019) is: 3.7%   Values used to calculate the score:     Age: 38 years     Clincally relevant sex: Female     Is Non-Hispanic African American: No     Diabetic: Yes     Tobacco smoker: No     Systolic Blood Pressure: 126 mmHg     Is BP treated: Yes     HDL Cholesterol: 44 mg/dL     Total Cholesterol: 173 mg/dL    Objective:  BP Readings from Last 3 Encounters:  10/04/23 126/84  08/30/23 138/74  06/15/23 137/80    Lab Results  Component Value Date   HGBA1C 10.6 (A) 08/30/2023   HGBA1C 9.7 (A) 05/13/2023   HGBA1C 10.5 (A) 02/02/2023       Latest Ref Rng & Units 03/17/2023    2:22 PM 02/13/2022    9:35 AM 02/06/2022    6:54 PM  BMP  Glucose 70 - 99 mg/dL 77  827  668   BUN 6 - 24 mg/dL 9  10  19    Creatinine 0.57 - 1.00 mg/dL 9.39  9.42  9.59   BUN/Creat Ratio 9 - 23 15  18     Sodium 134 - 144 mmol/L 143  142  138   Potassium 3.5 - 5.2 mmol/L 3.5  4.0  3.8   Chloride 96 - 106 mmol/L 105  102  102   CO2 20 - 29 mmol/L 21  21     Calcium  8.7 - 10.2 mg/dL 9.4  9.4      Lab Results  Component Value Date   CHOL 173 03/17/2023   HDL 44 03/17/2023   LDLCALC 110 (H) 03/17/2023   TRIG 101 03/17/2023   CHOLHDL 3.9 03/17/2023    Medications Reviewed Today     Reviewed by Brinda Lorain SQUIBB, RPH (Pharmacist) on 10/04/23 at 1743  Med List Status: <None>   Medication Order Taking? Sig Documenting Provider Last Dose Status Informant  amLODipine  (NORVASC ) 10 MG tablet [id number] Yes Take 1 tablet (10 mg total) by mouth daily. Gomez-Caraballo, Oneal, MD  Active   amoxicillin  (AMOXIL ) 500 MG capsule [id number]  Take 1 capsule (500 mg total) by mouth 3 (three) times daily.   Active   Blood Glucose Monitoring Suppl (TRUE METRIX METER) w/Device KIT [id number]  Use  to check blood sugar three times a day. Marylu Gee, DO  Active   glipiZIDE  (GLUCOTROL  XL) 5 MG 24 hr tablet [id number] Yes Take 1 tablet (5 mg total) by mouth daily with breakfast. Robin Ip, MD  Active   glucose blood (TRUE METRIX BLOOD GLUCOSE TEST) test strip [id number]  Use as instructed Marylu Gee, DO  Active   Insulin  Glargine (BASAGLAR  KWIKPEN) 100 UNIT/ML [id number] Yes Inject 60 Units into the skin daily. May increase up to 80 units daily if instructed by your provider. Gomez-Caraballo, Norely, MD  Active   insulin  lispro (HUMALOG  KWIKPEN) 100 UNIT/ML KwikPen [id number] Yes Inject 24 Units into the skin with breakfast, with lunch, and with evening meal. May inject up to 30 units with each meal if instructed by your provider. Gomez-Caraballo, [place], MD  Active   Insulin  Pen Needle (PEN NEEDLES) 31G X 5 MM MISC [id number]  Use to inject insulin  as instructed 4 times daily Robin Ip, MD  Active   Lancets MISC [id number]  Use to check blood sugar as directed Masters, Izetta, DO  Active   rosuvastatin  (CRESTOR ) 5 MG tablet [id number] Yes Take 1 tablet (5 mg total) by mouth daily. Robin Ip, MD  Active   TRUEplus Lancets 28G MISC [id number]   Use to check blood sugar three times a day. Marylu Gee, DO  Active               Assessment/Plan:   Diabetes: - Currently uncontrolled with most recent A1C of 10.6% above goal <7%. Medication adherence appears improved since appt on 08/30/23. SMBG have improved since increasing insulin  at last visit, but still above goal per patient report. Will continue to make small changes, as she did not have her glucometer with her for full review. Will consider switching to concentrated insulin  for improved absoprtion if patient is unable to achieve control on increased doses. She has not been able to tolerate GLP-1 or metformin . She is not currently a good candidate for SGLT2i given risk of GU infections with A1C > 10%. Continued to emphasize importance of lifestyle interventions. - Last UACR March 2025 - 19 mg/g - Reviewed long term cardiovascular and renal outcomes of uncontrolled blood sugar - Reviewed goal A1c, goal fasting, and goal 2 hour post prandial glucose - Reviewed hypoglycemia management plan and the rule of 15 - Reviewed dietary modifications including  utilizing the healthy plate method, limiting portion size of carbohydrate foods, increasing intake of protein and non-starchy vegetables. Counseled patient to stay hydrated with water throughout the day. Provided planning healthy meals handout today. - Reviewed lifestyle modifications including: aiming for 150 minutes of moderate intensity exercise every week.  - Recommend to INCREASE Basaglar  to 64 units daily - Recommend to INCREASE Humalog  to 26 units with breakfast and lunch and 24 units with dinner - Recommend to continue glipizide  XL 5 mg daily with a meal - Recommend to check glucose twice daily: fasting and 2-hr PPG . Counseled patient to bring glucometer or BG log to every appointment. - Next A1C due 11/30/23     Hypertension: - Currently uncontrolled with clinic BP slightly above goal less than 130/80. Counseled her on the  importance of daily adherence to amlodipine .  - Reviewed long term cardiovascular and renal outcomes of uncontrolled blood pressure - Recommend to continue amlodipine  10 mg daily    Hyperlipidemia/ASCVD Risk Reduction: - Currently uncontrolled with most recent LDL-C of 110 mg/dL above goal < 70 mg/dL given U7IF + comorbidities. Moderate intensity statin  is appropriate. She has initiated rosuvastatin  and is tolerating well, compared to atorvastatin  - Reviewed long term complications of uncontrolled cholesterol - Recommend to continue rosuvastatin  5 mg daily   Patient verbalized understanding of treatment plan. Provided with written instructions  Follow Up Plan:  Pharmacist telephone 10/25/23 PCP clinic visit 12/01/23   Lorain Baseman, PharmD [facility] [phone]

## 2023-10-04 NOTE — Patient Instructions (Addendum)
  Qu gusto verte hoy!  Tu nivel ideal de azcar en sangre es de 80 a 130 antes de comer y menos de 180 despus de comer.  Cambios en la medicacin:  Aumenta la dosis de Basaglar  a 64 unidades diarias. Si despus de una semana tu nivel de azcar en sangre matutino sigue elevado por encima de 130 mg/dL, aumenta a 68 unidades diarias.  Aumenta la dosis de Humalog  a 26 unidades con el desayuno y Engineer, production, y [place] con 24 unidades con Physicist, medical.  Avsanos si tienes niveles bajos de azcar en sangre inferiores a 70 mg/dL. Si esto ocurre, trtalo con 15 gramos de azcar de accin rpida, como media taza de jugo (jugo de [name], [place], [place], etc.) y vuelve a controlarte en 15 minutos. Repite el proceso si tu nivel de azcar en sangre an no supera los 70 mg/dL.  Contina con el resto de la medicacin de la [place].  Controla tu nivel de azcar en sangre en casa y lleva un registro (glucmetro o papel) para llevar a tu prxima consulta.  Sigue con tu buena alimentacin y ejercicio. Procura una dieta rica en verduras, frutas y carnes magras (pollo, pavo, pescado). Intenta limitar el consumo de sal comiendo verduras frescas o congeladas (en lugar de enlatadas), enjuaga las verduras enlatadas antes de cocinarlas y no aadas sal a las comidas.  __________________________________________________________________________________  It was nice to see you today!  Your goal blood sugar is 80-130 before eating and less than 180 after eating.  Medication Changes:  Increase Basaglar  to 64 units daily. If your morning blood sugars are still elevated above 130 mg/dL after 1 week, increase to 68 units daily  Increase Humalog  to 26 units with breakfast and lunch, continue 24 units with dinner  Let us  know if you have low blood sugars less than 70 mg/dL. If this happens, treat with 15 grams of fast acting sugar like one half cup on juice (apple juice, orange juice, grape juice, etc) and  recheck in 15 minutes. Repeat the process if your blood sugar is not above 70 mg/dL yet.   Continue all other medication the same.   Monitor blood sugars at home and keep a log (glucometer or piece of paper) to bring with you to your next visit.  Keep up the good work with diet and exercise. Aim for a diet full of vegetables, fruit and lean meats (chicken, [place], fish). Try to limit salt intake by eating fresh or frozen vegetables (instead of canned), rinse canned vegetables prior to cooking and do not add any additional salt to meals.

## 2023-10-25 ENCOUNTER — Other Ambulatory Visit: Payer: Self-pay

## 2023-10-25 DIAGNOSIS — I10 Essential (primary) hypertension: Secondary | ICD-10-CM

## 2023-10-25 NOTE — Progress Notes (Signed)
 10/25/2023 Name: Robin Klein MRN: [id number] DOB: [birth year]  Chief Complaint  Patient presents with   Diabetes    Robin Klein is a 53 y.o. year old female who was referred for medication management by their primary care provider, [name], MD. Patient presented for a telephone visit. Visit was conducted with assistance from [facility] interpreters ID [phone].   They were referred to the pharmacist by their PCP for assistance in managing diabetes . PMH includes HTN, MASLD, T2DM, HLD, BMI > 30.    Subjective: Patient was last seen by PCP, Dr. [name], on 08/30/23. At last visit, BP was 138/74 mmHg, HR 83. At this time, she reported that her recent BG have been in the 400s in the evenings and 200s in the morning. A1C increased from 9.7% to 10.6%. She had stopped Trulicity  (0.75 mg weekly) in late June due to nausea, fatigue, and onset of HA. She reports sometimes missing doses of either Humalog  or Basaglar . She was isntructed to take Basaglar  56 units daily, Humalog  20 units TID, and glipizide  5 mg daily. At pharmacy telephone appt on 09/16/23, patient reported improved adherence to her medications, but BG continued to be elevated. She was instructed to increase Basaglar  to 60 units daily and increase Humalog  to 24 units TID with meals. She was also switched from the Humalog  vial to pen for improved adherence. She reported having side effects to atorvastatin , and was instructed to start rosuvastatin  5 mg daily instead. At in-person pharmacy visit on 10/04/23. She reported BG were slightly improved, but still above goal. Instructed to increase Basaglar  up to 68 units daily and increase Humalog  to 26 units with breakfast and lunch, and 24 units with dinner.  Today, patient reports doing ok. Reports BG are still consistently in the 200s and lowest number she has seen recently is 180 mg/dL. Continues to try to restrict soda intake.   Care Team: Primary Care Provider:  [name], DO ; Next Scheduled Visit: 12/01/23  Medication Access/Adherence  Current Pharmacy:  [facility] - [facility] [address] [place] [address] Phone: [phone] Fax: [phone]  [facility] - [place], [place] - [address] [place] [address] Phone: [phone] Fax: [phone]  [facility] - [place], [place] - [address] [place] [address] [place] [address] Phone: [phone] Fax: [phone]   Patient reports affordability concerns with their medications: Yes  - Uses [facility] supply, no insurance Patient reports access/transportation concerns to their pharmacy: No  Patient reports adherence concerns with their medications:  Yes  - hx of not taking medications consistently   Reports she may have forgotten a few doses of Humalog  with meals (resulting in BG in the 300s), but otherwise adherence is ok.  Diabetes:  Current medications: Basaglar  68 units daily, Humalog  Kwikpen 26 units with breakfast/lunch and 24 units with dinner, glipizide  XL 5 mg daily (taking after breakfast) Medications tried in the past: Trulicity  0.75 mg weekly (nausea), metformin  IR and XR (GI AE), Jardiance  (cost)  Current glucose readings: Forgot to bring her meter with her today, but recalls some from memory True Metrix Meter: 10/23/23: 362 mg/dL (AM) - reports she had forgotten afternoon dose of Humalog  on Friday 10/20/23: 208 mg/dL (AM) 89/85/74: 647 mg/dL 89/86/74: 703 mg/dL 89/87/74: 715 mg/dL (before lunch), 232 mg/dL (before dinner) Reports lowest recently is 180 mg/dL on 89/89/74  Patient denies hypoglycemic s/sx including dizziness, shakiness, sweating. Patient denies hyperglycemic symptoms including polyuria, polydipsia, polyphagia, nocturia, neuropathy, blurred vision.  Current meal patterns: Patient reports eating 3 meals/day (Dinner is her smallest  meal).  Reports that she has cut back on sugary foods like sweet bread, chocolate. She reports that she is no longer eating rice. She is trying to cut back on tortillas (has cut back to 3-4 tortillas per day). Trying not to eat after 7PM.  - Reports she likes to eat salads, spinach, celery. - Limiting servings of carbohydrates - ex: 5-7 potatoes without salt - Eating fruits like cantaloupe, watermelon, strawberries. - Drinks: reports she is currently trying to cut back on coca cola intake - she reports that she would rather cut out soda completely than have a diet or sugar free soda. Reports she is drinking a lot of water throughout the day - continues to try to limit cola intake.  Current physical activity: Walkings in the mornings and the evenings  Current medication access support: [facility]  Hypertension:  Current medications: amlodipine  10 mg daily  Medications previously tried: lisinopril  (swelling), losartan  (n/v), hydrochlorothiazide  (rash)  Reports home BP 121/76, 121/86 mmHg   Hyperlipidemia/ASCVD Risk Reduction  Current lipid lowering medications: rosuvastatin  5 mg daily  Medications tried in the past: atorvastatin  - HA, nausea  Clinical ASCVD: No  The 10-year ASCVD risk score (Arnett DK, et al., 2019) is: 3.7%   Values used to calculate the score:     Age: 52 years     Clincally relevant sex: Female     Is Non-Hispanic African American: No     Diabetic: Yes     Tobacco smoker: No     Systolic Blood Pressure: 126 mmHg     Is BP treated: Yes     HDL Cholesterol: 44 mg/dL     Total Cholesterol: 173 mg/dL    Objective:  BP Readings from Last 3 Encounters:  10/04/23 126/84  08/30/23 138/74  06/15/23 137/80    Lab Results  Component Value Date   HGBA1C 10.6 (A) 08/30/2023   HGBA1C 9.7 (A) 05/13/2023   HGBA1C 10.5 (A) 02/02/2023       Latest Ref Rng & Units 03/17/2023    2:22 PM 02/13/2022    9:35 AM 02/06/2022    6:54 PM  BMP  Glucose 70 - 99 mg/dL 77  827  668    BUN 6 - 24 mg/dL 9  10  19    Creatinine 0.57 - 1.00 mg/dL 9.39  9.42  9.59   BUN/Creat Ratio 9 - 23 15  18     Sodium 134 - 144 mmol/L 143  142  138   Potassium 3.5 - 5.2 mmol/L 3.5  4.0  3.8   Chloride 96 - 106 mmol/L 105  102  102   CO2 20 - 29 mmol/L 21  21    Calcium  8.7 - 10.2 mg/dL 9.4  9.4      Lab Results  Component Value Date   CHOL 173 03/17/2023   HDL 44 03/17/2023   LDLCALC 110 (H) 03/17/2023   TRIG 101 03/17/2023   CHOLHDL 3.9 03/17/2023    Medications Reviewed Today     Reviewed by Brinda Lorain SQUIBB, RPH (Pharmacist) on 10/25/23 at 1617  Med List Status: <None>   Medication Order Taking? Sig Documenting Provider Last Dose Status Informant  amLODipine  (NORVASC ) 10 MG tablet [id number] Yes Take 1 tablet (10 mg total) by mouth daily. Elnora Ip, MD  Active      Discontinued 10/25/23 1617 (Completed Course)   Blood Glucose Monitoring Suppl (TRUE METRIX METER) w/Device KIT [id number]  Use to check blood sugar three times a day. Marylu Gee, DO  Active   glipiZIDE  (GLUCOTROL  XL) 5 MG 24 hr tablet [id number] Yes Take 1 tablet (5 mg total) by mouth daily with breakfast. Elnora Ip, MD  Active   glucose blood (TRUE METRIX BLOOD GLUCOSE TEST) test strip [id number]  Use as instructed Marylu Gee, DO  Active   Insulin  Glargine (BASAGLAR  KWIKPEN) 100 UNIT/ML [id number] Yes Inject 60 Units into the skin daily. May increase up to 80 units daily if instructed by your provider.  Patient taking differently: Inject 60 Units into the skin daily. May increase up to 80 units daily if instructed by your provider.   Gomez-Caraballo, Khristi, MD  Active   insulin  lispro (HUMALOG  KWIKPEN) 100 UNIT/ML KwikPen [id number] Yes Inject 24 Units into the skin with breakfast, with lunch, and with evening meal. May inject up to 30 units with each meal if instructed by your provider. Gomez-Caraballo, Solimar, MD  Active   Insulin  Pen Needle (PEN NEEDLES) 31G X 5 MM MISC [id number]  Use to  inject insulin  as instructed 4 times daily Elnora Ip, MD  Active   Lancets MISC [id number]  Use to check blood sugar as directed Masters, Izetta, DO  Active   rosuvastatin  (CRESTOR ) 5 MG tablet [id number] Yes Take 1 tablet (5 mg total) by mouth daily. Elnora Ip, MD  Active   TRUEplus Lancets 28G MISC [id number]  Use to check blood sugar three times a day. Marylu Gee, DO  Active               Assessment/Plan:   Diabetes: - Currently uncontrolled with most recent A1C of 10.6% above goal <7%. SMBG do not appear improved compared to visit on 10/04/23, despite increasing doses of insulin . Patient reports possible missed doses of Humalog  and possible increased intake of sugary food and carbohydrates, but feel that main reason for lack of glycemic control is insulin  resistance. Would like to switch to concentrated insulin  at follow-up, as patient is approaching maximum dose allowed with Basaglar  pen (80 units) and absorption is likely impaired. There is no concentrated basal insulin  available via DOH so will have to apply for Novo Noridsk PAP (Tresiba U200). She has not been able to tolerate GLP-1 or metformin . She is not currently a good candidate for SGLT2i given risk of GU infections with A1C > 10%. Continued to emphasize importance of lifestyle interventions. - Last UACR March 2025 - 19 mg/g - Reviewed long term cardiovascular and renal outcomes of uncontrolled blood sugar - Reviewed goal A1c, goal fasting, and goal 2 hour post prandial glucose - Reviewed hypoglycemia management plan and the rule of 15 - Reviewed dietary modifications including  utilizing the healthy plate method, limiting portion size of carbohydrate foods, increasing intake of protein and non-starchy vegetables. Counseled patient to stay hydrated with water throughout the day.  - Reviewed lifestyle modifications including: aiming for 150 minutes of moderate intensity exercise every week.  - Recommend to  INCREASE Basaglar  to 74 units daily. Will collaborate with patient advocate team to initiate application for Tresiba U200. - Recommend to continue Humalog  to 26 units with breakfast and lunch and 24 units with dinner.  - Recommend to continue glipizide  XL 5 mg daily with a meal - Recommend to check glucose twice daily: fasting and 2-hr PPG . Counseled patient to bring glucometer  or BG log to every appointment. - Next A1C due 11/30/23     Hypertension: - Currently variably controlled with home BP currently below goal less than 130/80. Counseled her on the importance of daily adherence to amlodipine .  - Reviewed long term cardiovascular and renal outcomes of uncontrolled blood pressure - Recommend to continue amlodipine  10 mg daily    Hyperlipidemia/ASCVD Risk Reduction: - Currently uncontrolled with most recent LDL-C of 110 mg/dL above goal < 70 mg/dL given U7IF + comorbidities. Moderate intensity statin is appropriate. She has initiated rosuvastatin  and is tolerating well, compared to atorvastatin  - Reviewed long term complications of uncontrolled cholesterol - Recommend to continue rosuvastatin  5 mg daily  - Repeat lipid panel at PCP follow-up  Patient verbalized understanding of treatment plan. Provided with written instructions  Follow Up Plan:  Pharmacist telephone 10/25/23 PCP clinic visit 12/01/23   Lorain Baseman, PharmD [facility] [phone]

## 2023-10-26 ENCOUNTER — Other Ambulatory Visit (HOSPITAL_COMMUNITY): Payer: Self-pay

## 2023-10-26 ENCOUNTER — Telehealth: Payer: Self-pay

## 2023-10-26 NOTE — Telephone Encounter (Signed)
 New PAP med Lelon U200), but re-enrollment with Novo Nordisk.   In process of mailing to patients home.

## 2023-10-26 NOTE — Telephone Encounter (Signed)
-----   Message from Lorain SHAUNNA Baseman sent at 10/25/2023  4:32 PM EDT ----- Erskin Gammons - can you initiate/mail out spanish application for Novo Nordisk PAP for Tresiba U200 (inject 80 units into the skin once daily. Maximum total daily dose of 120 units.). We have her old [employer] PAP form from last year scanned into media tab with her information on it - not sure if you can reuse any of that. Let me know if you have any questions or need more information from me.   Lorain Baseman, PharmD [facility] [phone]

## 2023-10-27 NOTE — Telephone Encounter (Signed)
Mailed to patients home. 

## 2023-11-08 ENCOUNTER — Other Ambulatory Visit: Payer: Self-pay

## 2023-11-15 ENCOUNTER — Other Ambulatory Visit: Payer: Self-pay

## 2023-11-15 ENCOUNTER — Telehealth: Payer: Self-pay

## 2023-11-15 NOTE — Telephone Encounter (Signed)
 Attempted to contact patient for scheduled appointment for medication management. Left HIPAA compliant message for patient to return my call at their convenience.   Lorain Baseman, PharmD [facility] [phone]

## 2023-11-15 NOTE — Progress Notes (Deleted)
 11/15/2023 Name: Robin Klein MRN: [id number] DOB: 05/15/1970  No chief complaint on file.   Robin Klein is a 53 y.o. year old female who was referred for medication management by their primary care provider, [name], MD. Patient presented for a telephone visit. Visit was conducted with assistance from [facility] interpreters ID [phone].   They were referred to the pharmacist by their PCP for assistance in managing diabetes . PMH includes HTN, MASLD, T2DM, HLD, BMI > 30.    Subjective: Patient was last seen by PCP, Dr. [name], on 08/30/23. At last visit, BP was 138/74 mmHg, HR 83. At this time, she reported that her recent BG have been in the 400s in the evenings and 200s in the morning. A1C increased from 9.7% to 10.6%. She had stopped Trulicity  (0.75 mg weekly) in late June due to nausea, fatigue, and onset of HA. She reports sometimes missing doses of either Humalog  or Basaglar . She was isntructed to take Basaglar  56 units daily, Humalog  20 units TID, and glipizide  5 mg daily. At pharmacy telephone appt on 09/16/23, patient reported improved adherence to her medications, but BG continued to be elevated. She was instructed to increase Basaglar  to 60 units daily and increase Humalog  to 24 units TID with meals. She was also switched from the Humalog  vial to pen for improved adherence. She reported having side effects to atorvastatin , and was instructed to start rosuvastatin  5 mg daily instead. At in-person pharmacy visit on 10/04/23. She reported BG were slightly improved, but still above goal. Instructed to increase Basaglar  up to 68 units daily and increase Humalog  to 26 units with breakfast and lunch, and 24 units with dinner. At pharmacy call on 10/25/23, she was instructed to further increase Basaglar  up to 74 units. An application for [place] U200 was mailed to the patient's address.  Today, patient reports doing ok. Reports BG are still consistently in the 200s  and lowest number she has seen recently is 180 mg/dL. Continues to try to restrict soda intake.   Care Team: Primary Care Provider: [name], DO ; Next Scheduled Visit: 12/01/23  Medication Access/Adherence  Current Pharmacy:  [facility] - [facility] [address] [place] [address] Phone: [phone] Fax: [phone]  [facility] [place], [place] - [address] [place] [address] Phone: [phone] Fax: [phone]  [facility] - [place], [place] - [address] [place] [address] [place] [address] Phone: [phone] Fax: [phone]   Patient reports affordability concerns with their medications: Yes  - Uses [facility] supply, no insurance Patient reports access/transportation concerns to their pharmacy: No  Patient reports adherence concerns with their medications:  Yes  - hx of not taking medications consistently   Reports she may have forgotten a few doses of Humalog  with meals (resulting in BG in the 300s), but otherwise adherence is ok.  Diabetes:  Current medications: Basaglar  68 units daily, Humalog  Kwikpen 26 units with breakfast/lunch and 24 units with dinner, glipizide  XL 5 mg daily (taking after breakfast) Medications tried in the past: Trulicity  0.75 mg weekly (nausea), metformin  IR and XR (GI AE), Jardiance  (cost)  Current glucose readings: Forgot to bring her meter with her today, but recalls some from memory True Metrix Meter: 10/23/23: 362 mg/dL (AM) - reports she had forgotten afternoon dose of Humalog  on Friday 10/20/23: 208 mg/dL (AM) 89/85/74: 647  mg/dL 89/86/74: 703 mg/dL 89/87/74: 715 mg/dL (before lunch), 232 mg/dL (before dinner) Reports lowest recently is 180 mg/dL on 89/89/74   Patient denies hypoglycemic s/sx including dizziness, shakiness, sweating. Patient denies hyperglycemic symptoms including polyuria, polydipsia,  polyphagia, nocturia, neuropathy, blurred vision.  Current meal patterns: Patient reports eating 3 meals/day (Dinner is her smallest meal).  Reports that she has cut back on sugary foods like sweet bread, chocolate. She reports that she is no longer eating rice. She is trying to cut back on tortillas (has cut back to 3-4 tortillas per day). Trying not to eat after 7PM.  - Reports she likes to eat salads, spinach, celery. - Limiting servings of carbohydrates - ex: 5-7 potatoes without salt - Eating fruits like cantaloupe, watermelon, strawberries. - Drinks: reports she is currently trying to cut back on coca cola intake - she reports that she would rather cut out soda completely than have a diet or sugar free soda. Reports she is drinking a lot of water throughout the day - continues to try to limit cola intake.  Current physical activity: Walkings in the mornings and the evenings  Current medication access support: [facility]  Hypertension:  Current medications: amlodipine  10 mg daily  Medications previously tried: lisinopril  (swelling), losartan  (n/v), hydrochlorothiazide  (rash)  Reports home BP 121/76, 121/86 mmHg   Hyperlipidemia/ASCVD Risk Reduction  Current lipid lowering medications: rosuvastatin  5 mg daily  Medications tried in the past: atorvastatin  - HA, nausea  Clinical ASCVD: No  The 10-year ASCVD risk score (Arnett DK, et al., 2019) is: 3.7%   Values used to calculate the score:     Age: 56 years     Clincally relevant sex: Female     Is Non-Hispanic African American: No     Diabetic: Yes     Tobacco smoker: No     Systolic Blood Pressure: 126 mmHg     Is BP treated: Yes     HDL Cholesterol: 44 mg/dL     Total Cholesterol: 173 mg/dL    Objective:  BP Readings from Last 3 Encounters:  10/04/23 126/84  08/30/23 138/74  06/15/23 137/80    Lab Results  Component Value Date   HGBA1C 10.6 (A) 08/30/2023   HGBA1C 9.7 (A) 05/13/2023   HGBA1C 10.5 (A) 02/02/2023        Latest Ref Rng & Units 03/17/2023    2:22 PM 02/13/2022    9:35 AM 02/06/2022    6:54 PM  BMP  Glucose 70 - 99 mg/dL 77  827  668   BUN 6 - 24 mg/dL 9  10  19    Creatinine 0.57 - 1.00 mg/dL 9.39  9.42  9.59   BUN/Creat Ratio 9 - 23 15  18     Sodium 134 - 144 mmol/L 143  142  138   Potassium 3.5 - 5.2 mmol/L 3.5  4.0  3.8   Chloride 96 - 106 mmol/L 105  102  102   CO2 20 - 29 mmol/L 21  21    Calcium  8.7 - 10.2 mg/dL 9.4  9.4      Lab Results  Component Value Date   CHOL 173 03/17/2023   HDL 44 03/17/2023   LDLCALC 110 (H) 03/17/2023   TRIG 101 03/17/2023   CHOLHDL 3.9 03/17/2023    Medications Reviewed Today   Medications were not reviewed in this encounter       Assessment/Plan:   Diabetes: - Currently uncontrolled with most recent A1C of 10.6% above  goal <7%. SMBG do not appear improved compared to visit on 10/04/23, despite increasing doses of insulin . Patient reports possible missed doses of Humalog  and possible increased intake of sugary food and carbohydrates, but feel that main reason for lack of glycemic control is insulin  resistance. Would like to switch to concentrated insulin  at follow-up, as patient is approaching maximum dose allowed with Basaglar  pen (80 units) and absorption is likely impaired. There is no concentrated basal insulin  available via [facility] so will have to apply for Novo Noridsk PAP (Tresiba U200). She has not been able to tolerate GLP-1 or metformin . She is not currently a good candidate for SGLT2i given risk of GU infections with A1C > 10%. Continued to emphasize importance of lifestyle interventions. - Last UACR March 2025 - 19 mg/g - Reviewed long term cardiovascular and renal outcomes of uncontrolled blood sugar - Reviewed goal A1c, goal fasting, and goal 2 hour post prandial glucose - Reviewed hypoglycemia management plan and the rule of 15 - Reviewed dietary modifications including  utilizing the healthy plate method, limiting portion size  of carbohydrate foods, increasing intake of protein and non-starchy vegetables. Counseled patient to stay hydrated with water throughout the day.  - Reviewed lifestyle modifications including: aiming for 150 minutes of moderate intensity exercise every week.  - Recommend to INCREASE Basaglar  to 74 units daily. Will collaborate with patient advocate team to initiate application for Tresiba U200. - Recommend to continue Humalog  to 26 units with breakfast and lunch and 24 units with dinner.  - Recommend to continue glipizide  XL 5 mg daily with a meal - Recommend to check glucose twice daily: fasting and 2-hr PPG . Counseled patient to bring glucometer or BG log to every appointment. - Next A1C due 11/30/23     Hypertension: - Currently variably controlled with home BP currently below goal less than 130/80. Counseled her on the importance of daily adherence to amlodipine .  - Reviewed long term cardiovascular and renal outcomes of uncontrolled blood pressure - Recommend to continue amlodipine  10 mg daily    Hyperlipidemia/ASCVD Risk Reduction: - Currently uncontrolled with most recent LDL-C of 110 mg/dL above goal < 70 mg/dL given U7IF + comorbidities. Moderate intensity statin is appropriate. She has initiated rosuvastatin  and is tolerating well, compared to atorvastatin  - Reviewed long term complications of uncontrolled cholesterol - Recommend to continue rosuvastatin  5 mg daily  - Repeat lipid panel at PCP follow-up  Patient verbalized understanding of treatment plan. Provided with written instructions  Follow Up Plan:  Pharmacist telephone 10/25/23 PCP clinic visit 12/06/23   Lorain Baseman, PharmD [facility] [phone]

## 2023-11-18 ENCOUNTER — Telehealth: Payer: Self-pay

## 2023-11-18 NOTE — Progress Notes (Signed)
 Complex Care Management Care Klein Note  11/18/2023 Name: Robin Klein MRN: [id number] DOB: 1970-04-11  Robin Klein Robin Klein is a 53 y.o. year old female who is a primary care patient of Robin Hadassah, Robin Klein and is actively engaged with the care management team. I reached out to Robin Klein Robin Klein by phone today to assist with re-scheduling  with the Pharmacist.  Follow up plan: Unsuccessful telephone outreach attempt made. A HIPAA compliant phone message was left for the patient providing contact information and requesting a return call.  Robin Klein Robin Klein, Robin Klein  Direct Dial : [phone]  Fax [phone]

## 2023-12-01 ENCOUNTER — Ambulatory Visit: Payer: Self-pay | Admitting: Student

## 2023-12-06 ENCOUNTER — Other Ambulatory Visit: Payer: Self-pay

## 2023-12-06 ENCOUNTER — Ambulatory Visit: Payer: Self-pay

## 2023-12-06 VITALS — BP 128/76 | HR 76 | Temp 98.1°F | Ht <= 58 in | Wt 171.2 lb

## 2023-12-06 DIAGNOSIS — E1165 Type 2 diabetes mellitus with hyperglycemia: Secondary | ICD-10-CM

## 2023-12-06 DIAGNOSIS — E119 Type 2 diabetes mellitus without complications: Secondary | ICD-10-CM

## 2023-12-06 DIAGNOSIS — E782 Mixed hyperlipidemia: Secondary | ICD-10-CM

## 2023-12-06 DIAGNOSIS — I10 Essential (primary) hypertension: Secondary | ICD-10-CM

## 2023-12-06 LAB — GLUCOSE, CAPILLARY: Glucose-Capillary: 307 mg/dL — ABNORMAL HIGH (ref 70–99)

## 2023-12-06 LAB — POCT GLYCOSYLATED HEMOGLOBIN (HGB A1C): HbA1c, POC (controlled diabetic range): 10.9 % — AB (ref 0.0–7.0)

## 2023-12-06 MED ORDER — TRUE METRIX BLOOD GLUCOSE TEST VI STRP
ORAL_STRIP | 12 refills | Status: AC
  Filled 2023-12-06: qty 100, 33d supply, fill #0

## 2023-12-06 MED ORDER — AMLODIPINE BESYLATE 10 MG PO TABS
10.0000 mg | ORAL_TABLET | Freq: Every day | ORAL | 3 refills | Status: AC
  Filled 2023-12-06 – 2023-12-16 (×2): qty 90, 90d supply, fill #0

## 2023-12-06 MED ORDER — ROSUVASTATIN CALCIUM 5 MG PO TABS
5.0000 mg | ORAL_TABLET | Freq: Every day | ORAL | 3 refills | Status: DC
  Filled 2023-12-06 – 2023-12-16 (×2): qty 90, 90d supply, fill #0

## 2023-12-06 MED ORDER — PEN NEEDLES 31G X 5 MM MISC
5 refills | Status: AC
  Filled 2023-12-06: qty 100, 25d supply, fill #0
  Filled 2024-01-07 (×2): qty 100, 25d supply, fill #1
  Filled 2024-01-27: qty 100, 25d supply, fill #2

## 2023-12-06 NOTE — Patient Instructions (Addendum)
 Le atendieron para una consulta de seguimiento de diabetes. Siga las instrucciones del plan de hoy: -Aumente la dosis diaria de Basaglar  de 74 a 78 unidades. -Consulte con la Sra. Lorain Baseman el 11/17/23. Robin Klein informar los resultados de su anlisis de colesterol. -Traiga sus lecturas de glucosa en la prxima consulta.  Gracias.  Robin Klein      You were seen for a diabetes follow-up visit. Please follow instructions as dosscussed in today's plan: -Increase Basaglar  to from 74 to 78 units Daily -Follow-up with Ms. Layna Fox on 12/16/23 -Will call with your cholesterol lab results -Bring your glucose readings during next visit.  Thank You  Robin Klein

## 2023-12-06 NOTE — Progress Notes (Unsigned)
 CC: Follow-up  HPI: [name] is a 53 year old female living with a history stated below and presents today for DM follow-up.  Please see problem-based assessment and plan for further details. Past Medical History:  Diagnosis Date   Depression    Diabetes mellitus    Fatty liver disease, nonalcoholic    Hypertension    Sinusitis     Current Outpatient Medications on File Prior to Visit  Medication Sig Dispense Refill   amLODipine  (NORVASC ) 10 MG tablet Take 1 tablet (10 mg total) by mouth daily. 90 tablet 3   Blood Glucose Monitoring Suppl (TRUE METRIX METER) w/Device KIT Use to check blood sugar three times a day. 1 kit 1   glipiZIDE  (GLUCOTROL  XL) 5 MG 24 hr tablet Take 1 tablet (5 mg total) by mouth daily with breakfast. 30 tablet 11   glucose blood (TRUE METRIX BLOOD GLUCOSE TEST) test strip Use as instructed 100 each 12   Insulin  Glargine (BASAGLAR  KWIKPEN) 100 UNIT/ML Inject 60 Units into the skin daily. May increase up to 80 units daily if instructed by your provider. (Patient taking differently: Inject 60 Units into the skin daily. May increase up to 80 units daily if instructed by your provider.) 18 mL 3   insulin  lispro (HUMALOG  KWIKPEN) 100 UNIT/ML KwikPen Inject 24 Units into the skin with breakfast, with lunch, and with evening meal. May inject up to 30 units with each meal if instructed by your provider. 24 mL 11   Insulin  Pen Needle (PEN NEEDLES) 31G X 5 MM MISC Use to inject insulin  as instructed 4 times daily 100 each 5   Lancets MISC Use to check blood sugar as directed 100 each 3   rosuvastatin  (CRESTOR ) 5 MG tablet Take 1 tablet (5 mg total) by mouth daily. 90 tablet 3   TRUEplus Lancets 28G MISC Use to check blood sugar three times a day. 100 each 3   No current facility-administered medications on file prior to visit.    Family History  Problem Relation Age of Onset   Diabetes Mother    CAD Mother        Died MI 26   Diabetes Father    CAD Father         Died MI 61   Cancer Paternal Grandmother     Social History   Socioeconomic History   Marital status: Married    Spouse name: Not on file   Number of children: Not on file   Years of education: 7   Highest education level: Not on file  Occupational History    Employer: UNEMPLOYED  Tobacco Use   Smoking status: Never   Smokeless tobacco: Never  Substance and Sexual Activity   Alcohol use: No    Alcohol/week: 0.0 standard drinks of alcohol   Drug use: No   Sexual activity: Not on file  Other Topics Concern   Not on file  Social History Narrative   Financial assistance approved for 100% discount at [facility] and has [facility] card per Barnie Potters   12/16/2009   Recently gave birth to a son 04/2009.   Social Drivers of Corporate Investment Banker Strain: Not on file  Food Insecurity: Food Insecurity Present (11/13/2022)   Hunger Vital Sign    Worried About Running Out of Food in the Last Year: Sometimes true    Ran Out of Food in the Last Year: Never true  Transportation Needs: No Transportation Needs (11/13/2022)   PRAPARE -  Administrator, Civil Service (Medical): No    Lack of Transportation (Non-Medical): No  Physical Activity: Not on file  Stress: Not on file  Social Connections: Not on file  Intimate Partner Violence: Not At Risk (11/13/2022)   Humiliation, Afraid, Rape, and Kick questionnaire    Fear of Current or Ex-Partner: No    Emotionally Abused: No    Physically Abused: No    Sexually Abused: No    Review of Systems: ROS  Per HPI, assessment, plan There were no vitals filed for this visit.  Physical Exam: Physical Exam Cardiovascular:     Rate and Rhythm: Normal rate and regular rhythm.     Comments: +2 RP BL Pulmonary:     Effort: Pulmonary effort is normal.     Breath sounds: Normal breath sounds.  Neurological:     Mental Status: She is alert.      Assessment & Plan:     Patient seen with Dr. Lovie  Assessment &  Plan Essential hypertension, benign Blood pressure of 137/87 with a repeat of 128/76.  Will continue with amlodipine  10 mg  - Amlodipine  10 mg Uncontrolled type 2 diabetes mellitus with hyperglycemia (HCC) Patient explains that her left eye has been jumping along with cramping in the base of the left side of the neck and ringing in the left ear that has started yesterday.  She experienced it many times yesterday and a few times today. she denies any blurry vision in either eye.  She has no pain in her temple regions.  Denies any fevers.  She also mentions feeling dizzy 2-3 times yesterday but has been feeling like this for the past 3 months.  Patient reports she has not had any hypoglycemic episodes.  She did not bring her glucose readings with her but does mention that her readings have been around 149-189 before this past Saturday.  Her glucose was 325 morning of the visit.  She reports her readings have been in the 300s in the afternoons and nothing below 145 even when she is fasting  Her A1c has increased to 10.9 from 10.6 3 months ago.  Patient has been seeing pharmacist Dr. Lorain Baseman for diabetes and other medication management.  Per charting, patient was undergoing transition from glargine to Tresiba if her long-acting insulin  dose needs were to exceed 80 units.  Patient reports she threw away her Tresiba packet as she thought it was for another medication.  Informed her that we will resend her the Tresiba packet and that she needs to complete it so her Tresiba application can move forward.  Informed [name] who confirmed that she would send patient the Tresiba packet.  Currently, we will need to increase patient's glargine dose to over 80.  However, since we do not have accurate blood glucose level results today we will only increase her glargine from 74 to 78 units.  Patient has an appointment with Dr. Lorain Baseman on 12/16/2023 during which further changes to insulin  dosing can be made.   Patient instructed to bring her blood glucose recordings to her next visit on 12/16/2023 with pharmacy.  Furthermore, patient's orthostatics were checked which were negative.  Her symptoms of eye twitching, headache, dizziness are likely due to her elevated blood glucose levels.  Will monitor and evaluate further in the future if symptoms worsen.  - Increase insulin  Basaglar  from 74 to 78 units daily -Continue glipizide  5 mg with breakfast -Continue Humalog  26 units with breakfast  and lunch and 24 units with dinner.  -Further insulin  increase based on glucose levels during visit with Dr. Lorain Baseman on 12/16/2023  Mixed hyperlipidemia Lipid panel results post visit showed LDL levels above goal (<70) at 122.  Recommend increasing her rosuvastatin  from 5 mg to 10 mg daily during her next visit pharmacy on 12/16/2023.  - Increase rosuvastatin  to 10 mg on 12/16/2023 pharmacy visit   Orders Placed This Encounter  Procedures   POC Hbg A1C     Rebecka Pion, D.O. [facility] Internal [facility], PGY-1 Date 12/06/2023 Time 1:16 PM

## 2023-12-07 LAB — LIPID PANEL
Chol/HDL Ratio: 4.1 ratio (ref 0.0–4.4)
Cholesterol, Total: 195 mg/dL (ref 100–199)
HDL: 47 mg/dL (ref >39)
LDL Chol Calc (NIH): 122 mg/dL — ABNORMAL HIGH (ref 0–99)
Triglycerides: 148 mg/dL (ref 0–149)
VLDL Cholesterol Cal: 26 mg/dL (ref 5–40)

## 2023-12-08 ENCOUNTER — Ambulatory Visit: Payer: Self-pay

## 2023-12-08 NOTE — Telephone Encounter (Signed)
 Mailing to pt home. 2nd attempt.   Per patient and provider request: Saw this pt yesterday. She reports throwing away her Tresiba Application as she thought it was a different medication (Trulicity ). Can you please re-send her the application?   Raeeha Syeda

## 2023-12-08 NOTE — Assessment & Plan Note (Signed)
 Blood pressure of 137/87 with a repeat of 128/76.  Will continue with amlodipine  10 mg  - Amlodipine  10 mg

## 2023-12-08 NOTE — Assessment & Plan Note (Signed)
 Lipid panel results post visit showed LDL levels above goal (<70) at 122.  Recommend increasing her rosuvastatin  from 5 mg to 10 mg daily during her next visit pharmacy on 12/16/2023.  - Increase rosuvastatin  to 10 mg on 12/16/2023 pharmacy visit

## 2023-12-13 ENCOUNTER — Telehealth: Payer: Self-pay

## 2023-12-13 NOTE — Progress Notes (Signed)
 Complex Care Management Care Guide Note  12/13/2023 Name: Robin Klein MRN: [id number] DOB: [birth year]  Robin Klein Robin Klein is a 53 y.o. year old female who is a primary care patient of Robin Hadassah, MD and is actively engaged with the care management team. I reached out to Robin Klein by phone today to assist with re-scheduling  with the Pharmacist.  Follow up plan: Unsuccessful telephone outreach attempt made. A HIPAA compliant phone message was left for the patient providing contact information and requesting a return call.  Leotis Rase [facility], [facility] Guide  Direct Dial : [phone]  Fax [phone]

## 2023-12-13 NOTE — Addendum Note (Signed)
 Addended by: Natasa Stigall L on: 12/13/2023 09:20 AM   Modules accepted: Level of Service

## 2023-12-13 NOTE — Progress Notes (Signed)
 Internal Medicine Clinic Attending  I was physically present during the key portions of the resident provided service and participated in the medical decision making of patient's management care. I reviewed pertinent patient test results.  The assessment, diagnosis, and plan were formulated together and I agree with the documentation in the resident's note.  Robin Clarity, MD    Today we discussed patient's uncontrolled T2DM and hyperlipidemia.  For her T2DM, the biggest thing we need to do is switch her from Glargine to Tresiba, which requires a prior authorization. Unfortunately, [name] misplaced her old Tresiba PAP application because she did not know it was for insulin . We are getting another application mailed to her. In the meantime, increase regimen to: Glargine 78 units daily; Humalog  26/26/24 with meals. Continue Glipizide  5mg  BID. We have instructed her to please bring her glucometer with her to her appointment with Dr. Brinda next week.   At Pharm D appointment: Review glucometer data Adjust insulin  as needed Ensure Tresiba application has been completed  Discuss increasing Rosuvastatin  from 5 to 10mg  daily

## 2023-12-14 NOTE — Telephone Encounter (Signed)
 Application placed in mail.

## 2023-12-16 ENCOUNTER — Other Ambulatory Visit (HOSPITAL_COMMUNITY): Payer: Self-pay

## 2023-12-16 ENCOUNTER — Other Ambulatory Visit: Payer: Self-pay

## 2023-12-16 DIAGNOSIS — E782 Mixed hyperlipidemia: Secondary | ICD-10-CM

## 2023-12-16 MED ORDER — ROSUVASTATIN CALCIUM 10 MG PO TABS
10.0000 mg | ORAL_TABLET | Freq: Every day | ORAL | 3 refills | Status: AC
  Filled 2023-12-16: qty 90, 90d supply, fill #0

## 2023-12-16 NOTE — Progress Notes (Signed)
 12/16/2023 Name: Robin Klein MRN: [id number] DOB: [birth year]  Chief Complaint  Patient presents with   Diabetes   Hyperlipidemia    Robin Klein is a 53 y.o. year old female who was referred for medication management by their primary care provider, [name], MD. Patient presented for a telephone visit. Visit was conducted with assistance from [facility] interpreters ID [phone]. Had attempted last week to change to an in person appt, but scheduler was not able to reach patient.   They were referred to the pharmacist by their PCP for assistance in managing diabetes . PMH includes HTN, MASLD, T2DM, HLD, BMI > 30.    Subjective: Patient was seen by PCP, Dr. [name], on 08/30/23. At last visit, BP was 138/74 mmHg, HR 83. At this time, she reported that her recent BG have been in the 400s in the evenings and 200s in the morning. A1C increased from 9.7% to 10.6%. She had stopped Trulicity  (0.75 mg weekly) in late June due to nausea, fatigue, and onset of HA. She reported sometimes missing doses of either Humalog  or Basaglar . She was isntructed to take Basaglar  56 units daily, Humalog  20 units TID, and glipizide  5 mg daily. At pharmacy telephone appt on 09/16/23, patient reported improved adherence to her medications, but BG continued to be elevated. She was instructed to increase Basaglar  to 60 units daily and increase Humalog  to 24 units TID with meals. She was also switched from the Humalog  vial to pen for improved adherence. She reported having side effects to atorvastatin , and was instructed to start rosuvastatin  5 mg daily instead. At in-person pharmacy visit on 10/04/23. She reported BG were slightly improved, but still above goal. Instructed to increase Basaglar  up to 68 units daily and increase Humalog  to 26 units with breakfast and lunch, and 24 units with dinner. At pharmacy call on 10/25/23, patient was instructed to increase Basaglar  to 74 units daily. We also  discussed switching from Basaglar  to Tresiba for more concentrated insulin . She returned to PCP, Dr. Edgardo, on 12/06/23. BP was 128/76 mmHg. She reported she had thrown away Tresiba application because she did not know it was for insulin . Increased insulin  glargine to 78 units daily. LDL-C was still elevated above goal despite switching to rosuvastatin  5 mg daily.   Today, patient reports doing ok. Reports she is out of insulin . Difficult to clarify which she is out of, but she eventually confirms she is out of Basaglar . States she has been out for the past 8 days. Issue filling it at the pharmacy. The only insulin  that she took yesterday was Humalog  with her meals - 26/26/24 with breakfast, lunch and dinner as prescribed. BG have been running in the high 300s instead of the high 100s since being out of Basaglar . Reports she will be able to pick up medications from the pharmacy today if they can fill it. Reports she also needs refills of her oral medications. She continues to be confused about application for Tresiba. Expressed that Basaglar  is a better medication for her, but I explained the rationale for switching to Tresiba and she was agreeable to return application once it is received.  Care Team: Primary Care Provider: [name], DO ; Next Scheduled Visit: needs to be scheduled  Medication Access/Adherence  Current Pharmacy:  [facility] [address] [place] [address] Phone: [phone] Fax: [phone]  [facility] [place] [place] - [address] [place] [address] Phone: [phone] Fax: [phone]  [facility] [place], [place] - [address] [place] [address] Phone: [phone] Fax: [phone]   Patient reports affordability concerns with their medications: Yes  - Uses [facility] supply, no insurance Patient reports  access/transportation concerns to their pharmacy: No  Patient reports adherence concerns with their medications:  Yes  - hx of not taking medications consistently  Diabetes:  Current medications: Basaglar  78 units daily (took for 3 days until she ran out of Basaglar  about one week ago), Humalog  Kwikpen 26 units with breakfast/lunch and 24 units with dinner, glipizide  XL 5 mg daily (taking after breakfast) Medications tried in the past: Trulicity  0.75 mg weekly (nausea), metformin  IR and XR (GI AE), Jardiance  (cost)  Current glucose readings: Does not have meter with her, recalls recent BG from memory. They are much more elevated due to  being out of Basaglar  for one week. Reports FBG of 3280 mg/dL yesterday. Previously reported to Dr. Edgardo that lowest FBG was 145 mg/dL (ranging 850-810 mg/dL typically) and BG were usually in the 300s after eating.  Patient denies hypoglycemic s/sx including dizziness, shakiness, sweating. Patient denies hyperglycemic symptoms including polyuria, polydipsia, polyphagia, nocturia, neuropathy, blurred vision.  Current meal patterns: Patient reports eating 3 meals/day (Dinner is her smallest meal).  Previously reported that she has cut back on sugary foods like sweet bread, chocolate. Had also stated she was no longer eating rice and trying to cut back on tortillas ( to 3-4 tortillas per day). Trying not to eat after 7PM.  - Reports she likes to eat salads, spinach, celery. - Limiting servings of carbohydrates - ex: 5-7 potatoes without salt - Eating fruits like cantaloupe, watermelon, strawberries. - Drinks: reports she is currently trying to cut back on coca cola intake - she reports that she would rather cut out soda completely than have a diet or sugar free soda. Reports she is drinking a lot of water throughout the day - continues to try to limit cola intake.  Current physical activity: Walkings in the mornings and the evenings  Current medication access  support: [facility]  Hypertension:  Current medications: amlodipine  10 mg daily  Medications previously tried: lisinopril  (swelling), losartan  (n/v), hydrochlorothiazide  (rash)  Did not review home BP today given med access concerns  Hyperlipidemia/ASCVD Risk Reduction  Current lipid lowering medications: rosuvastatin  5 mg daily  - she is willing to increase to 10 mg daily Medications tried in the past: atorvastatin  - HA, nausea  Clinical ASCVD: No  The 10-year ASCVD risk score (Arnett DK, et al., 2019) is: 4.1%   Values used to calculate the score:     Age: 15 years     Clinically relevant sex: Female     Is Non-Hispanic African American: No     Diabetic: Yes     Tobacco smoker: No     Systolic Blood Pressure: 128 mmHg     Is BP treated: Yes     HDL Cholesterol: 47 mg/dL     Total Cholesterol: 195 mg/dL    Objective:  BP Readings from Last 3 Encounters:  12/06/23 128/76  10/04/23 126/84  08/30/23 138/74    Lab Results  Component Value Date   HGBA1C 10.9 (A) 12/06/2023   HGBA1C 10.6 (A) 08/30/2023   HGBA1C 9.7 (A) 05/13/2023       Latest Ref Rng & Units 03/17/2023  2:22 PM 02/13/2022    9:35 AM 02/06/2022    6:54 PM  BMP  Glucose 70 - 99 mg/dL 77  827  668   BUN 6 - 24 mg/dL 9  10  19    Creatinine 0.57 - 1.00 mg/dL 9.39  9.42  9.59   BUN/Creat Ratio 9 - 23 15  18     Sodium 134 - 144 mmol/L 143  142  138   Potassium 3.5 - 5.2 mmol/L 3.5  4.0  3.8   Chloride 96 - 106 mmol/L 105  102  102   CO2 20 - 29 mmol/L 21  21    Calcium  8.7 - 10.2 mg/dL 9.4  9.4      Lab Results  Component Value Date   CHOL 195 12/06/2023   HDL 47 12/06/2023   LDLCALC 122 (H) 12/06/2023   TRIG 148 12/06/2023   CHOLHDL 4.1 12/06/2023    Medications Reviewed Today     Reviewed by Brinda Lorain SQUIBB, RPH-CPP (Pharmacist) on 12/16/23 at 1150  Med List Status: <None>   Medication Order Taking? Sig Documenting Provider Last Dose Status Informant  amLODipine  (NORVASC ) 10 MG tablet [id number]  Yes Take 1 tablet (10 mg total) by mouth daily. Edgardo Pontiff, DO  Active   Blood Glucose Monitoring Suppl (TRUE METRIX METER) w/Device KIT [id number]  Use to check blood sugar three times a day. Marylu Gee, DO  Active   glipiZIDE  (GLUCOTROL  XL) 5 MG 24 hr tablet [id number] Yes Take 1 tablet (5 mg total) by mouth daily with breakfast. Elnora Ip, MD  Active   glucose blood (TRUE METRIX BLOOD GLUCOSE TEST) test strip [id number]  Use as instructed Syeda, Raeeha, DO  Active   Insulin  Glargine (BASAGLAR  KWIKPEN) 100 UNIT/ML [id number]  Inject 60 Units into the skin daily. May increase up to 80 units daily if instructed by your provider.  Patient not taking: Reported on 12/16/2023   Gomez-Caraballo, Paz, MD  Active   insulin  lispro (HUMALOG  [facility]) 100 UNIT/ML KwikPen [id number] Yes Inject 24 Units into the skin with breakfast, with lunch, and with evening meal. May inject up to 30 units with each meal if instructed by your provider. Gomez-Caraballo, Charissa, MD  Active   Insulin  Pen Needle (PEN NEEDLES) 31G X 5 MM MISC [id number]  Use to inject insulin  as instructed 4 times daily Edgardo Pontiff, DO  Active   Lancets MISC [id number]  Use to check blood sugar as directed Masters, Izetta, DO  Active   rosuvastatin  (CRESTOR ) 10 MG tablet [id number] Yes Take 1 tablet (10 mg total) by mouth daily. Edgardo Pontiff, DO  Active   TRUEplus Lancets 28G MISC [id number]  Use to check blood sugar three times a day. Marylu Gee, DO  Active               Assessment/Plan:   Diabetes: - Currently uncontrolled with most recent A1C of 10.9% above goal <7% and slightly worsened from 10.6% at last check. Today, had to focus on immediate med access concerns as she reported being out of Basaglar . Confirmed that pharmacy can fill Basaglar , Humalog , and her other maintenance medications for her to pick up this afternoon. Patient agreeable to return in 3 weeks with glucometer for review and further adjustment.  Explained reasoning for switching from Basaglar  U100 to Tresiba U200. Patient expresses understanding and is agreeable to return application. Likely appropriate to also switch to Humalog  U200 in the future (out of stock at [facility] for [facility] supply) today. She has  not been able to tolerate GLP-1 or metformin . She is not currently a good candidate for SGLT2i given risk of GU infections with A1C > 10%. Continued to emphasize importance of lifestyle interventions. - Last UACR March 2025 - 19 mg/g - Reviewed long term cardiovascular and renal outcomes of uncontrolled blood sugar - Reviewed goal A1c, goal fasting, and goal 2 hour post prandial glucose - Reviewed hypoglycemia management plan and the rule of 15 - Reviewed dietary modifications including  utilizing the healthy plate method, limiting portion size of carbohydrate foods, increasing intake of protein and non-starchy vegetables. Counseled patient to stay hydrated with water throughout the day.  - Reviewed lifestyle modifications including: aiming for 150 minutes of moderate intensity exercise every week.  - Recommend to continue Basaglar  to 78 units daily. Advised patient to return signed application for [place] U200 as soon as she is able.  - Recommend to continue Humalog  to 26 units with breakfast and lunch and 24 units with dinner.  - Recommend to continue glipizide  XL 5 mg daily with a meal - Provided patient with my direct line and encouraged her to call me if she runs out of insulin  in the future. - Recommend to check glucose twice daily: fasting and 2-hr PPG . Counseled patient to bring glucometer or BG log to every appointment. - Next A1C due 03/05/24   Hypertension: - Currently  controlled with clinic and home BP recently below goal less than 130/80.  - Reviewed long term cardiovascular and renal outcomes of uncontrolled blood pressure - Recommend to continue amlodipine  10 mg daily    Hyperlipidemia/ASCVD Risk Reduction: - Currently  uncontrolled with most recent LDL-C of 122 mg/dL above goal < 70 mg/dL given U7IF + comorbidities. LDL-C worsened despite supposed adherence to rosuvastatin  5 mg daily. She is agreeable to increase dose today. Titrating slowly due to hx of AE with atorvastatin . - Reviewed long term complications of uncontrolled cholesterol - Recommend to increase rosuvastatin  to 10 mg daily   Patient verbalized understanding of treatment plan. Provided with written instructions  Follow Up Plan:  Pharmacist in person 01/10/24 PCP clinic visit needs to be scheduled   Lorain Baseman, PharmD [facility] [phone]

## 2024-01-07 ENCOUNTER — Other Ambulatory Visit: Payer: Self-pay

## 2024-01-10 ENCOUNTER — Other Ambulatory Visit (HOSPITAL_COMMUNITY): Payer: Self-pay

## 2024-01-10 ENCOUNTER — Ambulatory Visit (INDEPENDENT_AMBULATORY_CARE_PROVIDER_SITE_OTHER): Payer: Self-pay

## 2024-01-10 ENCOUNTER — Other Ambulatory Visit: Payer: Self-pay

## 2024-01-10 DIAGNOSIS — Z91148 Patient's other noncompliance with medication regimen for other reason: Secondary | ICD-10-CM

## 2024-01-10 DIAGNOSIS — Z794 Long term (current) use of insulin: Secondary | ICD-10-CM

## 2024-01-10 DIAGNOSIS — Z833 Family history of diabetes mellitus: Secondary | ICD-10-CM

## 2024-01-10 DIAGNOSIS — Z5971 Insufficient health insurance coverage: Secondary | ICD-10-CM

## 2024-01-10 DIAGNOSIS — Z8249 Family history of ischemic heart disease and other diseases of the circulatory system: Secondary | ICD-10-CM

## 2024-01-10 DIAGNOSIS — I10 Essential (primary) hypertension: Secondary | ICD-10-CM

## 2024-01-10 DIAGNOSIS — Z79899 Other long term (current) drug therapy: Secondary | ICD-10-CM

## 2024-01-10 DIAGNOSIS — E785 Hyperlipidemia, unspecified: Secondary | ICD-10-CM

## 2024-01-10 DIAGNOSIS — E119 Type 2 diabetes mellitus without complications: Secondary | ICD-10-CM

## 2024-01-10 MED ORDER — BASAGLAR KWIKPEN 100 UNIT/ML ~~LOC~~ SOPN
80.0000 [IU] | PEN_INJECTOR | Freq: Every day | SUBCUTANEOUS | 3 refills | Status: AC
  Filled 2024-01-10 – 2024-01-27 (×2): qty 24, 30d supply, fill #0

## 2024-01-10 NOTE — Progress Notes (Signed)
 "  01/10/2024 Name: Robin Klein MRN: [id number] DOB: Feb 29, 1970  Chief Complaint  Patient presents with   Diabetes    Robin Klein is a 54 y.o. year old female who was referred for medication management by their primary care provider, [name], MD. Patient presented for a face-to-face visit. Live interpreter was available during the visit.   They were referred to the pharmacist by their PCP for assistance in managing diabetes . PMH includes HTN, MASLD, T2DM, HLD, BMI > 30.    Subjective: She saw PCP, Dr. Edgardo, on 12/06/23. BP was 128/76 mmHg. She reported she had thrown away Tresiba application because she did not know it was for insulin . Increased insulin  glargine to 78 units daily. LDL-C was still elevated above goal despite switching to rosuvastatin  5 mg daily. At pharmacy call on 12/16/23, patient reported she had been out of Basaglar  for 8 days and BG were running in the high 300s. Encouraged her to fill out Tresiba U200 application and return as soon as possible.   Today, patient reports doing ok. States she has not been making healthy food choices lately. She was able to resume Basaglar  and has continued Humalog  as prescribed. Denies any episodes of hypoglycemia. Checking BG about once daily. Does not have Tresiba U200 ([employer]) application with her, but is willing to sign in clinic today.    Care Team: Primary Care Provider: [name], DO ; Next Scheduled Visit: needs to be scheduled ~March 2026  Medication Access/Adherence  Current Pharmacy:  [facility] - [facility] [address] [place] [address] Phone: [phone] Fax: [phone]  [facility] - [place], [place] - [address] [place] [address] Phone: [phone] Fax: [phone]  [facility] - [place], [place] - [address] [place] [address] [place]  [address] Phone: [phone] Fax: [phone]   Patient reports affordability concerns with their medications: Yes  - Uses [facility] supply, no insurance Patient reports access/transportation concerns to their pharmacy: No  Patient reports adherence concerns with their medications:  Yes  - hx of not taking medications consistently  Diabetes:  Current medications: Basaglar  78 units daily, Humalog  Kwikpen 26 units with breakfast and lunch and 24 units with dinner, glipizide  XL 5 mg daily (taking after breakfast) Medications tried in the past: Trulicity  0.75 mg weekly (nausea), metformin  IR and XR (GI AE), Jardiance  (cost)  Current glucose readings:  01/10/24: 9AM 260 mg/dL 08/10/71: 89JF 697 mg/dL 08/11/71: 89JF 879 mg/dL - recalls she had plenty of vegetables, steak, a little rice for dinner the night before and did not eat after 7PM 01/08/24: 3PM 219 mg/dL 08/12/71: 87EF 773 mg/dL 08/13/71: 88:69JF 610 mg/dL 87/68/74: 88JF 784 mg/dL 87/69/74: 88JF 734 mg/dL 87/70/74: 89JF 730 mg/dL, 3PM 574 mg/dL 87/71/74: 89JF 839 mg/dL, PM 648 mg/dL mg/dL 87/72/74: 89JF 790 mg/dL 87/73/74: 1PM 759 mg/dL  Feels different with sugars below 180 mg/dL, but not dizzy shaky or sweaty. Patient denies hypoglycemic s/sx including dizziness, shakiness, sweating. Patient denies hyperglycemic symptoms including polyuria, polydipsia, polyphagia, nocturia, neuropathy, blurred vision.  Current meal patterns: Patient reports eating 3 meals/day (Dinner is her smallest meal). Often eats snacks after dinner as well.  Has tried to cut back on rice and tortillas (to 3-4 tortillas per day). Trying not to eat after 7PM.  - Reports she likes to eat salads, spinach, celery. - Limiting servings of  carbohydrates - ex: 5-7 potatoes without salt - Eating fruits like cantaloupe, watermelon, strawberries. - Drinks: says she is not drinking soda, but around the holidays has been having a traditional punch with added sugars, fruits (mandarin,  guava, plum) and spices  Current physical activity: Walkings in the mornings and the evenings - not as much in the winter. Tries to walk longer while shopping.   Current medication access support: [facility]  Hypertension:  Current medications: amlodipine  10 mg daily  Medications previously tried: lisinopril  (swelling), losartan  (n/v), hydrochlorothiazide  (rash)  Hyperlipidemia/ASCVD Risk Reduction  Current lipid lowering medications: rosuvastatin  10 mg daily Medications tried in the past: atorvastatin  - HA, nausea  Clinical ASCVD: No  The 10-year ASCVD risk score (Arnett DK, et al., 2019) is: 4.1%   Values used to calculate the score:     Age: 54 years     Clinically relevant sex: Female     Is Non-Hispanic African American: No     Diabetic: Yes     Tobacco smoker: No     Systolic Blood Pressure: 128 mmHg     Is BP treated: Yes     HDL Cholesterol: 47 mg/dL     Total Cholesterol: 195 mg/dL    Objective:  BP Readings from Last 3 Encounters:  12/06/23 128/76  10/04/23 126/84  08/30/23 138/74    Lab Results  Component Value Date   HGBA1C 10.9 (A) 12/06/2023   HGBA1C 10.6 (A) 08/30/2023   HGBA1C 9.7 (A) 05/13/2023       Latest Ref Rng & Units 03/17/2023    2:22 PM 02/13/2022    9:35 AM 02/06/2022    6:54 PM  BMP  Glucose 70 - 99 mg/dL 77  827  668   BUN 6 - 24 mg/dL 9  10  19    Creatinine 0.57 - 1.00 mg/dL 9.39  9.42  9.59   BUN/Creat Ratio 9 - 23 15  18     Sodium 134 - 144 mmol/L 143  142  138   Potassium 3.5 - 5.2 mmol/L 3.5  4.0  3.8   Chloride 96 - 106 mmol/L 105  102  102   CO2 20 - 29 mmol/L 21  21    Calcium  8.7 - 10.2 mg/dL 9.4  9.4      Lab Results  Component Value Date   CHOL 195 12/06/2023   HDL 47 12/06/2023   LDLCALC 122 (H) 12/06/2023   TRIG 148 12/06/2023   CHOLHDL 4.1 12/06/2023    Medications Reviewed Today     Reviewed by Brinda Lorain SQUIBB, RPH-CPP (Pharmacist) on 01/10/24 at 1211  Med List Status: <None>   Medication Order Taking? Sig  Documenting Provider Last Dose Status Informant  amLODipine  (NORVASC ) 10 MG tablet [id number] Yes Take 1 tablet (10 mg total) by mouth daily. Edgardo Pontiff, DO  Active   Blood Glucose Monitoring Suppl (TRUE METRIX METER) w/Device KIT [id number]  Use to check blood sugar three times a day. Marylu Gee, DO  Active   glipiZIDE  (GLUCOTROL  XL) 5 MG 24 hr tablet [id number] Yes Take 1 tablet (5 mg total) by mouth daily with breakfast. Elnora Ip, MD  Active   glucose blood (TRUE METRIX BLOOD GLUCOSE TEST) test strip [id number]  Use as instructed Syeda, Raeeha, DO  Active   Insulin  Glargine (BASAGLAR  KWIKPEN) 100 UNIT/ML [id number] Yes Inject 60 Units into the skin daily. May increase up to 80 units daily if instructed by your provider. Elnora Ip, MD  Active  insulin  lispro (HUMALOG  KWIKPEN) 100 UNIT/ML KwikPen [id number] Yes Inject 24 Units into the skin with breakfast, with lunch, and with evening meal. May inject up to 30 units with each meal if instructed by your provider. Gomez-Caraballo, Joslyn, MD  Active   Insulin  Pen Needle (PEN NEEDLES) 31G X 5 MM MISC [id number]  Use to inject insulin  as instructed 4 times daily Edgardo Pontiff, DO  Active   Lancets MISC [id number]  Use to check blood sugar as directed Masters, Izetta, DO  Active   rosuvastatin  (CRESTOR ) 10 MG tablet [id number] Yes Take 1 tablet (10 mg total) by mouth daily. Edgardo Pontiff, DO  Active   TRUEplus Lancets 28G MISC [id number]  Use to check blood sugar three times a day. Marylu Gee, DO  Active               Assessment/Plan:   Diabetes: - Currently uncontrolled with most recent A1C of 10.9% above goal <7% and slightly worsened from 10.6% at last check. SMBG consistent with A1C. Minimal improvement despite titration of insulin . Obtained signatures to transition to Tresiba U200 today. Appears dietary habits are main barrier to control. Discussed importance of healthy food choices to control blood sugar. Will  continue titration of basal and prandial insulin  today, but discussed with patient that if she makes changes to her diet we can likely back off of her insulin  doses. She has not been able to tolerate GLP-1 or metformin  in the past. She is not currently a good candidate for SGLT2i given risk of GU infections with A1C > 10%. Continued to emphasize importance of lifestyle interventions. - Last UACR March 2025 - 19 mg/g - Reviewed long term cardiovascular and renal outcomes of uncontrolled blood sugar - Reviewed goal A1c, goal fasting, and goal 2 hour post prandial glucose - Reviewed hypoglycemia management plan and the rule of 15 - Reviewed dietary modifications including  utilizing the healthy plate method, limiting portion size of carbohydrate foods, increasing intake of protein and non-starchy vegetables. Counseled patient to stay hydrated with water throughout the day.  - Reviewed lifestyle modifications including: aiming for 150 minutes of moderate intensity exercise every week.  - Recommend to increase Basaglar  to 80 units daily. Will collaborate with CPh[facility] to submit [employer] application for Tresiba U200 today. - Recommend to increase Humalog  to 30 units with breakfast and lunch and 28 units with dinner. Advised patient that if she experiences low sugars due to diet changes, she can return to previous doses. Aware to reach out if recurrent hypoglycemia occurs. - Recommend to continue glipizide  XL 5 mg daily with a meal - Recommend to check glucose twice daily: fasting and 2-hr PPG . Counseled patient to bring glucometer or BG log to every appointment. - Next A1C due 03/05/24   Hypertension: - Currently  controlled with clinic and home BP recently below goal less than 130/80.  - Reviewed long term cardiovascular and renal outcomes of uncontrolled blood pressure - Recommend to continue amlodipine  10 mg daily    Hyperlipidemia/ASCVD Risk Reduction: - Currently uncontrolled with most recent LDL-C  of 122 mg/dL above goal < 70 mg/dL given U7IF + comorbidities. LDL-C worsened despite supposed adherence to rosuvastatin  5 mg daily. She is tolerating rosuvastatin  10 mg daily. Did not increase to high intensity statin at previous visit due to hx of AE with atorvastatin . - Reviewed long term complications of uncontrolled cholesterol - Recommend to continue rosuvastatin  to 10 mg daily   Patient verbalized understanding of treatment plan. Provided with  written instructions  Follow Up Plan:  Pharmacist in person 01/31/24 PCP clinic visit needs to be scheduled   Lorain Baseman, PharmD [facility] [phone]   "

## 2024-01-10 NOTE — Patient Instructions (Addendum)
 Fue un placer verte hoy!  Tu objetivo de glucosa en sangre es de 80-130 mg/dL antes de comer y menos de 180 mg/dL despus de comer. Controla tus niveles de glucosa en casa y lleva un registro (con un glucmetro o en una hoja de papel) para traerlo a tu prxima cita.  Cambios en la medicacin: Aumentar la dosis de Basaglar  a 80 unidades diarias.  Aumentar la dosis de Humalog  a 30 unidades con el desayuno, 30 unidades con el almuerzo y 28 unidades con la cena.  Continuar con glipizida 5 mg diarios con el desayuno.  Continuar con todos los dems medicamentos sin cambios.  Si experimentas sntomas de hipoglucemia (mareos, temblores, sudoracin), mide tu nivel de glucosa en sangre. Si es inferior a 70 mg/dL, debes consumir 15 gramos de carbohidratos de accin rpida, como 4 tabletas de glucosa, media taza de refresco regular o media taza de jugo. Vuelve a medir tu nivel de glucosa despus de 15 minutos. Si el nivel sigue por debajo de 70 mg/dL, repite el proceso. Si esto ocurre con frecuencia, debes comunicrselo a tu mdico.  Recomendaciones de estilo de vida:  Intente realizar 150 minutos de ejercicio de intensidad moderada a la semana. Esto incluye cualquier actividad que aumente su ritmo cardaco y respiratorio, biomedical engineer que an le permita mantener una conversacin. Hacer ejercicio despus de las comidas puede ayudar a chartered loss adjuster de banker.  Recomendaciones de estilo de vida:  Recomendaciones dietticas:  Intente consumir 3 comidas principales siguiendo el mtodo del plato saludable y 1 o 2 refrigerios al futures trader. No pase ms de 4 o 5 horas despierto sin comer. Desayune durante la primera hora despus de levantarse.  Los carbohidratos incluyen almidn, azcar y [place]. El azcar y el almidn elevan la glucosa en sangre.  Los alimentos ricos en almidn incluyen pan, arroz, pasta, patatas, maz, cereales, smola, galletas saladas, bagels, muffins y todos los productos horneados.   Algunas frutas tienen un alto contenido de azcar, como las uvas, la sanda, las naranjas y la [place] de las frutas tropicales. Limite la porcin a  taza por vez.  Los alimentos proteicos incluyen carne, pescado, aves, huevos, lcteos y legumbres (aunque las legumbres tambin aportan carbohidratos).  Las verduras sin almidn no theatre manager de production assistant, radio. Estas incluyen verduras de marriott, brcoli, esprragos, zanahorias, coliflor, pepino, championes, pimientos, calabaza [place], calabacn y tomate, [place].  Evite todas las bebidas azucaradas. Mantngase hidratado con agua durante todo medical laboratory scientific officer. El agua con gas o saborizada, el t sin azcar, el caf solo y las bebidas sin azcar o dietticas no afectarn su nivel de production assistant, radio, pero no deben ser su nica fuente de hidratacin.  Lorain Baseman, PharmD [facility] [phone] _________________________________________________________________  It was nice to see you today!  Your goal blood sugar is 80-130 mg/dL before eating and less than 180 mg/dL after eating. Monitor blood sugars at home and keep a log (glucometer or piece of paper) to bring with you to your next visit.  Medication Changes: Increase Basaglar  to 80 units daily  Increase Humalog  to 30 units with breakfast, 30 units with lunch, and 28 with dinner  Continue glipizide  5 mg daily with breakfast   Continue all other medication the same.   If you experience symptoms of a low blood sugar (dizzy, shaky, sweaty) check your blood sugar. If it is less than 70 mg/dL, you should eat or drink 15 grams of fast-acting carbohydrates like 4  glucose tablets, 1/2 cup of regular soda, or 1/2 cup of juice. Recheck your blood sugar after 15 minutes. If the level is still below 70 mg/dL repeat the process. If this occurs frequently, you should notify your healthcare provider.   Lifestyle Recommendations:  Aim for 150 minutes of moderate intensity  exercise every week. This is any activity that elevates your heart rate and breathing rate, but still allows you to carry on a conversation. Exercising after meals can help prevent spikes in your blood sugar.  Diet Recommendations:   Try to eat 3 real meals using the healthy plate method and 1-2 snacks per day. Never go more than 4-5 hours while awake without eating. Eat breakfast within the first hour of getting up.     Carbohydrates include starch, sugar, and fiber. Sugar and starch raise blood glucose.  Starchy foods include bread, rice, pasta, potatoes, corn, cereal, grits, crackers, bagels, muffins, all baked foods Some fruits are higher in sugar, like grapes, watermelon, oranges, and most tropical fruits. Limit your serving size to 1/2 cup at a time.  Protein foods include meat, fish, poultry, eggs, dairy, and beans (although beans also provide carbohydrates).  Non-starchy vegetables do not impact your blood sugar very much. These include greens, broccoli, asparagus, carrots, cauliflower, cucumber, mushrooms, peppers, yellow squash, zucchini squash, tomato, to name a few!  Avoid all sugary beverages. Stay hydrated with water throughout the day. Sparkling/flavored water, unsweet tea, black coffee, and zero-sugar or diet drinks will not impact your blood sugar, but they should not be used as your only source of hydration!  You can find more information at https://diabetes.org/food-nutrition/eating-healthy   Lorain Baseman, PharmD [facility] [phone]

## 2024-01-12 ENCOUNTER — Other Ambulatory Visit (HOSPITAL_COMMUNITY): Payer: Self-pay

## 2024-01-12 NOTE — Telephone Encounter (Signed)
 Medicaid denial letter provided.  PAP: Application for [place] has been submitted to [employer], via fax.

## 2024-01-12 NOTE — Telephone Encounter (Signed)
 Patient completed application in office 01/10/24.  Spoke with patient (via interpreter) to confirm household size (5) and to discuss Medicaid denial letter needed to submit with [employer] application. Patient says she has an appt today for an [employer] and will also look into applying for Medicaid or getting the denial letter while she is there.

## 2024-01-19 ENCOUNTER — Telehealth: Payer: Self-pay

## 2024-01-19 NOTE — Telephone Encounter (Signed)
 A user error has taken place: encounter opened in error, closed for administrative reasons.

## 2024-01-27 ENCOUNTER — Other Ambulatory Visit: Payer: Self-pay

## 2024-01-31 ENCOUNTER — Ambulatory Visit: Payer: Self-pay

## 2024-02-03 ENCOUNTER — Other Ambulatory Visit: Payer: Self-pay

## 2024-02-07 ENCOUNTER — Other Ambulatory Visit (HOSPITAL_COMMUNITY): Payer: Self-pay

## 2024-02-16 ENCOUNTER — Ambulatory Visit: Payer: Self-pay | Admitting: Student

## 2024-02-17 ENCOUNTER — Ambulatory Visit: Payer: Self-pay
